# Patient Record
Sex: Male | Born: 1935 | Race: Black or African American | Hispanic: No | Marital: Married | Smoking: Never smoker
Health system: Southern US, Community
[De-identification: ages and names within clinical notes are randomized; demographics above are authoritative.]

## PROBLEM LIST (undated history)

## (undated) DIAGNOSIS — N400 Enlarged prostate without lower urinary tract symptoms: Secondary | ICD-10-CM

## (undated) DIAGNOSIS — I10 Essential (primary) hypertension: Secondary | ICD-10-CM

## (undated) DIAGNOSIS — C9000 Multiple myeloma not having achieved remission: Secondary | ICD-10-CM

## (undated) HISTORY — PX: PROSTATE SURGERY: SHX751

## (undated) HISTORY — PX: EYE SURGERY: SHX253

---

## 1999-08-28 ENCOUNTER — Emergency Department (HOSPITAL_COMMUNITY): Admission: EM | Admit: 1999-08-28 | Discharge: 1999-08-28 | Payer: Self-pay | Admitting: Emergency Medicine

## 1999-09-03 ENCOUNTER — Encounter: Payer: Self-pay | Admitting: Urology

## 1999-09-04 ENCOUNTER — Inpatient Hospital Stay (HOSPITAL_COMMUNITY): Admission: RE | Admit: 1999-09-04 | Discharge: 1999-09-06 | Payer: Self-pay | Admitting: Urology

## 1999-09-06 ENCOUNTER — Emergency Department (HOSPITAL_COMMUNITY): Admission: EM | Admit: 1999-09-06 | Discharge: 1999-09-06 | Payer: Self-pay | Admitting: Emergency Medicine

## 2001-02-21 ENCOUNTER — Other Ambulatory Visit: Admission: RE | Admit: 2001-02-21 | Discharge: 2001-02-21 | Payer: Self-pay | Admitting: Urology

## 2001-02-21 ENCOUNTER — Encounter (INDEPENDENT_AMBULATORY_CARE_PROVIDER_SITE_OTHER): Payer: Self-pay | Admitting: Specialist

## 2002-09-21 ENCOUNTER — Ambulatory Visit: Admission: RE | Admit: 2002-09-21 | Discharge: 2002-12-20 | Payer: Self-pay | Admitting: Radiation Oncology

## 2002-11-06 ENCOUNTER — Encounter: Admission: RE | Admit: 2002-11-06 | Discharge: 2002-11-06 | Payer: Self-pay | Admitting: Urology

## 2002-11-06 ENCOUNTER — Encounter: Payer: Self-pay | Admitting: Urology

## 2002-12-08 ENCOUNTER — Ambulatory Visit (HOSPITAL_BASED_OUTPATIENT_CLINIC_OR_DEPARTMENT_OTHER): Admission: RE | Admit: 2002-12-08 | Discharge: 2002-12-08 | Payer: Self-pay | Admitting: Urology

## 2002-12-28 ENCOUNTER — Ambulatory Visit: Admission: RE | Admit: 2002-12-28 | Discharge: 2002-12-28 | Payer: Self-pay | Admitting: Radiation Oncology

## 2002-12-30 ENCOUNTER — Ambulatory Visit: Admission: RE | Admit: 2002-12-30 | Discharge: 2002-12-30 | Payer: Self-pay | Admitting: Radiation Oncology

## 2003-12-28 ENCOUNTER — Encounter: Admission: RE | Admit: 2003-12-28 | Discharge: 2003-12-28 | Payer: Self-pay | Admitting: Internal Medicine

## 2004-03-18 ENCOUNTER — Ambulatory Visit (HOSPITAL_COMMUNITY): Admission: RE | Admit: 2004-03-18 | Discharge: 2004-03-18 | Payer: Self-pay | Admitting: *Deleted

## 2009-07-08 ENCOUNTER — Ambulatory Visit: Payer: Self-pay | Admitting: Internal Medicine

## 2009-07-10 LAB — CBC WITH DIFFERENTIAL/PLATELET
BASO%: 0.4 % (ref 0.0–2.0)
Basophils Absolute: 0 10*3/uL (ref 0.0–0.1)
EOS%: 2.7 % (ref 0.0–7.0)
Eosinophils Absolute: 0.1 10*3/uL (ref 0.0–0.5)
HCT: 36.6 % — ABNORMAL LOW (ref 38.4–49.9)
HGB: 11.7 g/dL — ABNORMAL LOW (ref 13.0–17.1)
LYMPH%: 28.8 % (ref 14.0–49.0)
MCH: 25.2 pg — ABNORMAL LOW (ref 27.2–33.4)
MCHC: 32 g/dL (ref 32.0–36.0)
MCV: 78.6 fL — ABNORMAL LOW (ref 79.3–98.0)
MONO#: 0.5 10*3/uL (ref 0.1–0.9)
MONO%: 9.9 % (ref 0.0–14.0)
NEUT#: 2.7 10*3/uL (ref 1.5–6.5)
NEUT%: 58.2 % (ref 39.0–75.0)
Platelets: 181 10*3/uL (ref 140–400)
RBC: 4.66 10*6/uL (ref 4.20–5.82)
RDW: 13.2 % (ref 11.0–14.6)
WBC: 4.7 10*3/uL (ref 4.0–10.3)
lymph#: 1.3 10*3/uL (ref 0.9–3.3)

## 2009-07-12 LAB — COMPREHENSIVE METABOLIC PANEL
ALT: 20 U/L (ref 0–53)
AST: 22 U/L (ref 0–37)
Albumin: 3.9 g/dL (ref 3.5–5.2)
Alkaline Phosphatase: 52 U/L (ref 39–117)
BUN: 16 mg/dL (ref 6–23)
CO2: 26 mEq/L (ref 19–32)
Calcium: 9.3 mg/dL (ref 8.4–10.5)
Chloride: 105 mEq/L (ref 96–112)
Creatinine, Ser: 1.07 mg/dL (ref 0.40–1.50)
Glucose, Bld: 100 mg/dL — ABNORMAL HIGH (ref 70–99)
Potassium: 4 mEq/L (ref 3.5–5.3)
Sodium: 140 mEq/L (ref 135–145)
Total Bilirubin: 0.5 mg/dL (ref 0.3–1.2)
Total Protein: 6.9 g/dL (ref 6.0–8.3)

## 2009-07-12 LAB — IMMUNOFIXATION ELECTROPHORESIS
IgA: 157 mg/dL (ref 68–378)
IgG (Immunoglobin G), Serum: 1610 mg/dL (ref 694–1618)
IgM, Serum: 50 mg/dL — ABNORMAL LOW (ref 60–263)
Total Protein, Serum Electrophoresis: 6.9 g/dL (ref 6.0–8.3)

## 2009-07-12 LAB — KAPPA/LAMBDA LIGHT CHAINS
Kappa free light chain: 1.56 mg/dL (ref 0.33–1.94)
Kappa:Lambda Ratio: 1.2 (ref 0.26–1.65)
Lambda Free Lght Chn: 1.3 mg/dL (ref 0.57–2.63)

## 2009-07-12 LAB — LACTATE DEHYDROGENASE: LDH: 132 U/L (ref 94–250)

## 2009-07-12 LAB — BETA 2 MICROGLOBULIN, SERUM: Beta-2 Microglobulin: 1.49 mg/L (ref 1.01–1.73)

## 2009-07-19 ENCOUNTER — Ambulatory Visit (HOSPITAL_COMMUNITY): Admission: RE | Admit: 2009-07-19 | Discharge: 2009-07-19 | Payer: Self-pay | Admitting: Internal Medicine

## 2009-07-23 LAB — CBC WITH DIFFERENTIAL/PLATELET
BASO%: 0.4 % (ref 0.0–2.0)
Basophils Absolute: 0 10*3/uL (ref 0.0–0.1)
EOS%: 3.5 % (ref 0.0–7.0)
Eosinophils Absolute: 0.2 10*3/uL (ref 0.0–0.5)
HCT: 35.6 % — ABNORMAL LOW (ref 38.4–49.9)
HGB: 11.6 g/dL — ABNORMAL LOW (ref 13.0–17.1)
LYMPH%: 28.7 % (ref 14.0–49.0)
MCH: 25.4 pg — ABNORMAL LOW (ref 27.2–33.4)
MCHC: 32.6 g/dL (ref 32.0–36.0)
MCV: 77.8 fL — ABNORMAL LOW (ref 79.3–98.0)
MONO#: 0.4 10*3/uL (ref 0.1–0.9)
MONO%: 7.8 % (ref 0.0–14.0)
NEUT#: 2.8 10*3/uL (ref 1.5–6.5)
NEUT%: 59.6 % (ref 39.0–75.0)
Platelets: 172 10*3/uL (ref 140–400)
RBC: 4.58 10*6/uL (ref 4.20–5.82)
RDW: 13.2 % (ref 11.0–14.6)
WBC: 4.7 10*3/uL (ref 4.0–10.3)
lymph#: 1.3 10*3/uL (ref 0.9–3.3)

## 2009-07-24 LAB — FERRITIN: Ferritin: 158 ng/mL (ref 22–322)

## 2009-07-24 LAB — ERYTHROPOIETIN: Erythropoietin: 22.7 m[IU]/mL (ref 2.6–34.0)

## 2009-07-24 LAB — VITAMIN B12: Vitamin B-12: 429 pg/mL (ref 211–911)

## 2009-07-24 LAB — IRON AND TIBC
%SAT: 24 % (ref 20–55)
Iron: 66 ug/dL (ref 42–165)
TIBC: 276 ug/dL (ref 215–435)
UIBC: 210 ug/dL

## 2009-07-24 LAB — FOLATE: Folate: 15.9 ng/mL

## 2009-10-10 ENCOUNTER — Ambulatory Visit: Payer: Self-pay | Admitting: Internal Medicine

## 2009-10-14 LAB — CBC WITH DIFFERENTIAL/PLATELET
BASO%: 0.5 % (ref 0.0–2.0)
Basophils Absolute: 0 10*3/uL (ref 0.0–0.1)
EOS%: 3.4 % (ref 0.0–7.0)
Eosinophils Absolute: 0.2 10*3/uL (ref 0.0–0.5)
HCT: 37.5 % — ABNORMAL LOW (ref 38.4–49.9)
HGB: 12 g/dL — ABNORMAL LOW (ref 13.0–17.1)
LYMPH%: 24.8 % (ref 14.0–49.0)
MCH: 25.6 pg — ABNORMAL LOW (ref 27.2–33.4)
MCHC: 32.1 g/dL (ref 32.0–36.0)
MCV: 79.8 fL (ref 79.3–98.0)
MONO#: 0.4 10*3/uL (ref 0.1–0.9)
MONO%: 8.2 % (ref 0.0–14.0)
NEUT#: 3.4 10*3/uL (ref 1.5–6.5)
NEUT%: 63.1 % (ref 39.0–75.0)
Platelets: 171 10*3/uL (ref 140–400)
RBC: 4.71 10*6/uL (ref 4.20–5.82)
RDW: 12.9 % (ref 11.0–14.6)
WBC: 5.4 10*3/uL (ref 4.0–10.3)
lymph#: 1.3 10*3/uL (ref 0.9–3.3)

## 2009-10-15 LAB — COMPREHENSIVE METABOLIC PANEL
ALT: 16 U/L (ref 0–53)
AST: 18 U/L (ref 0–37)
Albumin: 4 g/dL (ref 3.5–5.2)
Alkaline Phosphatase: 49 U/L (ref 39–117)
BUN: 15 mg/dL (ref 6–23)
CO2: 27 mEq/L (ref 19–32)
Calcium: 9.6 mg/dL (ref 8.4–10.5)
Chloride: 105 mEq/L (ref 96–112)
Creatinine, Ser: 1.01 mg/dL (ref 0.40–1.50)
Glucose, Bld: 97 mg/dL (ref 70–99)
Potassium: 4.2 mEq/L (ref 3.5–5.3)
Sodium: 143 mEq/L (ref 135–145)
Total Bilirubin: 0.5 mg/dL (ref 0.3–1.2)
Total Protein: 6.8 g/dL (ref 6.0–8.3)

## 2009-10-15 LAB — IGG, IGA, IGM
IgA: 150 mg/dL (ref 68–378)
IgG (Immunoglobin G), Serum: 1470 mg/dL (ref 694–1618)
IgM, Serum: 50 mg/dL — ABNORMAL LOW (ref 60–263)

## 2009-10-15 LAB — BETA 2 MICROGLOBULIN, SERUM: Beta-2 Microglobulin: 1.8 mg/L — ABNORMAL HIGH (ref 1.01–1.73)

## 2009-10-15 LAB — KAPPA/LAMBDA LIGHT CHAINS
Kappa free light chain: 1.56 mg/dL (ref 0.33–1.94)
Kappa:Lambda Ratio: 1.47 (ref 0.26–1.65)
Lambda Free Lght Chn: 1.06 mg/dL (ref 0.57–2.63)

## 2009-10-15 LAB — LACTATE DEHYDROGENASE: LDH: 121 U/L (ref 94–250)

## 2010-02-10 ENCOUNTER — Ambulatory Visit: Payer: Self-pay | Admitting: Internal Medicine

## 2010-02-17 LAB — CBC WITH DIFFERENTIAL/PLATELET
BASO%: 0 % (ref 0.0–2.0)
Basophils Absolute: 0 10*3/uL (ref 0.0–0.1)
EOS%: 3.6 % (ref 0.0–7.0)
Eosinophils Absolute: 0.2 10*3/uL (ref 0.0–0.5)
HCT: 36 % — ABNORMAL LOW (ref 38.4–49.9)
HGB: 11.7 g/dL — ABNORMAL LOW (ref 13.0–17.1)
LYMPH%: 20.9 % (ref 14.0–49.0)
MCH: 25.8 pg — ABNORMAL LOW (ref 27.2–33.4)
MCHC: 32.6 g/dL (ref 32.0–36.0)
MCV: 79.1 fL — ABNORMAL LOW (ref 79.3–98.0)
MONO#: 0.4 10*3/uL (ref 0.1–0.9)
MONO%: 7.1 % (ref 0.0–14.0)
NEUT#: 4.1 10*3/uL (ref 1.5–6.5)
NEUT%: 68.4 % (ref 39.0–75.0)
Platelets: 171 10*3/uL (ref 140–400)
RBC: 4.56 10*6/uL (ref 4.20–5.82)
RDW: 12.5 % (ref 11.0–14.6)
WBC: 6 10*3/uL (ref 4.0–10.3)
lymph#: 1.3 10*3/uL (ref 0.9–3.3)

## 2010-02-18 LAB — COMPREHENSIVE METABOLIC PANEL
ALT: 17 U/L (ref 0–53)
AST: 17 U/L (ref 0–37)
Albumin: 4 g/dL (ref 3.5–5.2)
Alkaline Phosphatase: 47 U/L (ref 39–117)
BUN: 13 mg/dL (ref 6–23)
CO2: 28 mEq/L (ref 19–32)
Calcium: 9.5 mg/dL (ref 8.4–10.5)
Chloride: 104 mEq/L (ref 96–112)
Creatinine, Ser: 1.03 mg/dL (ref 0.40–1.50)
Glucose, Bld: 108 mg/dL — ABNORMAL HIGH (ref 70–99)
Potassium: 4.3 mEq/L (ref 3.5–5.3)
Sodium: 139 mEq/L (ref 135–145)
Total Bilirubin: 0.5 mg/dL (ref 0.3–1.2)
Total Protein: 6.7 g/dL (ref 6.0–8.3)

## 2010-02-18 LAB — IGG, IGA, IGM
IgA: 150 mg/dL (ref 68–378)
IgG (Immunoglobin G), Serum: 1660 mg/dL — ABNORMAL HIGH (ref 694–1618)
IgM, Serum: 50 mg/dL — ABNORMAL LOW (ref 60–263)

## 2010-02-18 LAB — KAPPA/LAMBDA LIGHT CHAINS
Kappa free light chain: 1.69 mg/dL (ref 0.33–1.94)
Kappa:Lambda Ratio: 1.84 — ABNORMAL HIGH (ref 0.26–1.65)
Lambda Free Lght Chn: 0.92 mg/dL (ref 0.57–2.63)

## 2010-02-18 LAB — LACTATE DEHYDROGENASE: LDH: 120 U/L (ref 94–250)

## 2010-02-18 LAB — BETA 2 MICROGLOBULIN, SERUM: Beta-2 Microglobulin: 1.76 mg/L — ABNORMAL HIGH (ref 1.01–1.73)

## 2010-06-23 ENCOUNTER — Ambulatory Visit: Payer: Self-pay | Admitting: Internal Medicine

## 2010-06-25 LAB — CBC WITH DIFFERENTIAL/PLATELET
BASO%: 0.5 % (ref 0.0–2.0)
Basophils Absolute: 0 10*3/uL (ref 0.0–0.1)
EOS%: 2.8 % (ref 0.0–7.0)
Eosinophils Absolute: 0.1 10*3/uL (ref 0.0–0.5)
HCT: 36.3 % — ABNORMAL LOW (ref 38.4–49.9)
HGB: 11.7 g/dL — ABNORMAL LOW (ref 13.0–17.1)
LYMPH%: 22.7 % (ref 14.0–49.0)
MCH: 25.5 pg — ABNORMAL LOW (ref 27.2–33.4)
MCHC: 32.2 g/dL (ref 32.0–36.0)
MCV: 79.2 fL — ABNORMAL LOW (ref 79.3–98.0)
MONO#: 0.3 10*3/uL (ref 0.1–0.9)
MONO%: 6.7 % (ref 0.0–14.0)
NEUT#: 3.3 10*3/uL (ref 1.5–6.5)
NEUT%: 67.3 % (ref 39.0–75.0)
Platelets: 179 10*3/uL (ref 140–400)
RBC: 4.58 10*6/uL (ref 4.20–5.82)
RDW: 12.6 % (ref 11.0–14.6)
WBC: 5 10*3/uL (ref 4.0–10.3)
lymph#: 1.1 10*3/uL (ref 0.9–3.3)

## 2010-06-26 LAB — COMPREHENSIVE METABOLIC PANEL
ALT: 16 U/L (ref 0–53)
AST: 17 U/L (ref 0–37)
Albumin: 4 g/dL (ref 3.5–5.2)
Alkaline Phosphatase: 50 U/L (ref 39–117)
BUN: 17 mg/dL (ref 6–23)
CO2: 24 mEq/L (ref 19–32)
Calcium: 9.1 mg/dL (ref 8.4–10.5)
Chloride: 105 mEq/L (ref 96–112)
Creatinine, Ser: 1.05 mg/dL (ref 0.40–1.50)
Glucose, Bld: 109 mg/dL — ABNORMAL HIGH (ref 70–99)
Potassium: 3.7 mEq/L (ref 3.5–5.3)
Sodium: 141 mEq/L (ref 135–145)
Total Bilirubin: 0.5 mg/dL (ref 0.3–1.2)
Total Protein: 7 g/dL (ref 6.0–8.3)

## 2010-06-26 LAB — KAPPA/LAMBDA LIGHT CHAINS
Kappa free light chain: 1.66 mg/dL (ref 0.33–1.94)
Kappa:Lambda Ratio: 1.38 (ref 0.26–1.65)
Lambda Free Lght Chn: 1.2 mg/dL (ref 0.57–2.63)

## 2010-06-26 LAB — IGG, IGA, IGM
IgA: 149 mg/dL (ref 68–378)
IgG (Immunoglobin G), Serum: 1660 mg/dL — ABNORMAL HIGH (ref 694–1618)
IgM, Serum: 57 mg/dL — ABNORMAL LOW (ref 60–263)

## 2010-06-26 LAB — BETA 2 MICROGLOBULIN, SERUM: Beta-2 Microglobulin: 1.52 mg/L (ref 1.01–1.73)

## 2010-12-22 ENCOUNTER — Ambulatory Visit: Payer: Self-pay | Admitting: Internal Medicine

## 2010-12-24 LAB — CBC WITH DIFFERENTIAL/PLATELET
BASO%: 0.3 % (ref 0.0–2.0)
Basophils Absolute: 0 10*3/uL (ref 0.0–0.1)
EOS%: 1.3 % (ref 0.0–7.0)
Eosinophils Absolute: 0.1 10*3/uL (ref 0.0–0.5)
HCT: 36 % — ABNORMAL LOW (ref 38.4–49.9)
HGB: 11.6 g/dL — ABNORMAL LOW (ref 13.0–17.1)
LYMPH%: 13 % — ABNORMAL LOW (ref 14.0–49.0)
MCH: 25.4 pg — ABNORMAL LOW (ref 27.2–33.4)
MCHC: 32.2 g/dL (ref 32.0–36.0)
MCV: 78.9 fL — ABNORMAL LOW (ref 79.3–98.0)
MONO#: 0.3 10*3/uL (ref 0.1–0.9)
MONO%: 4.9 % (ref 0.0–14.0)
NEUT#: 5.1 10*3/uL (ref 1.5–6.5)
NEUT%: 80.5 % — ABNORMAL HIGH (ref 39.0–75.0)
Platelets: 164 10*3/uL (ref 140–400)
RBC: 4.56 10*6/uL (ref 4.20–5.82)
RDW: 13 % (ref 11.0–14.6)
WBC: 6.3 10*3/uL (ref 4.0–10.3)
lymph#: 0.8 10*3/uL — ABNORMAL LOW (ref 0.9–3.3)

## 2010-12-25 LAB — KAPPA/LAMBDA LIGHT CHAINS
Kappa free light chain: 1.38 mg/dL (ref 0.33–1.94)
Kappa:Lambda Ratio: 2 — ABNORMAL HIGH (ref 0.26–1.65)
Lambda Free Lght Chn: 0.69 mg/dL (ref 0.57–2.63)

## 2010-12-25 LAB — LACTATE DEHYDROGENASE: LDH: 112 U/L (ref 94–250)

## 2010-12-25 LAB — COMPREHENSIVE METABOLIC PANEL
ALT: 14 U/L (ref 0–53)
AST: 16 U/L (ref 0–37)
Albumin: 3.8 g/dL (ref 3.5–5.2)
Alkaline Phosphatase: 51 U/L (ref 39–117)
BUN: 16 mg/dL (ref 6–23)
CO2: 27 mEq/L (ref 19–32)
Calcium: 9.1 mg/dL (ref 8.4–10.5)
Chloride: 103 mEq/L (ref 96–112)
Creatinine, Ser: 0.97 mg/dL (ref 0.40–1.50)
Glucose, Bld: 162 mg/dL — ABNORMAL HIGH (ref 70–99)
Potassium: 4.1 mEq/L (ref 3.5–5.3)
Sodium: 140 mEq/L (ref 135–145)
Total Bilirubin: 0.6 mg/dL (ref 0.3–1.2)
Total Protein: 6.6 g/dL (ref 6.0–8.3)

## 2010-12-25 LAB — BETA 2 MICROGLOBULIN, SERUM: Beta-2 Microglobulin: 1.29 mg/L (ref 1.01–1.73)

## 2010-12-25 LAB — IGG, IGA, IGM
IgA: 133 mg/dL (ref 68–378)
IgG (Immunoglobin G), Serum: 1580 mg/dL (ref 694–1618)
IgM, Serum: 46 mg/dL — ABNORMAL LOW (ref 60–263)

## 2010-12-31 ENCOUNTER — Ambulatory Visit (HOSPITAL_BASED_OUTPATIENT_CLINIC_OR_DEPARTMENT_OTHER): Payer: Medicare Other | Admitting: Internal Medicine

## 2010-12-31 DIAGNOSIS — D649 Anemia, unspecified: Secondary | ICD-10-CM

## 2010-12-31 DIAGNOSIS — D472 Monoclonal gammopathy: Secondary | ICD-10-CM

## 2011-04-17 NOTE — Op Note (Signed)
NAMETIMMEY, LAMBA                            ACCOUNT NO.:  [id number]   MEDICAL RECORD NO.:  [id number]                   PATIENT TYPE:  AMB   LOCATION:  ENDO                                 FACILITY:  [facility]   PHYSICIAN:  Georgiana Spinner, M.D.                 DATE OF BIRTH:  February 20, 1929   DATE OF PROCEDURE:  03/18/2004  DATE OF DISCHARGE:                                 OPERATIVE REPORT   PROCEDURE:  Upper endoscopy.   ENDOSCOPIST:  Georgiana Spinner, M.D.   INDICATIONS:  Gastroesophageal reflux disease and abdominal pain.   ANESTHESIA:  Demerol 40 mg, Versed 4 mg.   DESCRIPTION OF PROCEDURE:  With the patient mildly sedated in the left  lateral decubitus position, the Olympus videoscopic endoscope was inserted  into the mouth, passed under direct vision through the esophagus which  appeared normal into the stomach. The fundus, body, antrum, duodenal bulb,  second portion of the duodenum all appeared normal.  From this point, the  endoscope was slowly withdrawn taking  circumferential views of the duodenal  mucosa until the endoscope was then pulled back into the stomach, placed in  retroflexion to view the stomach from below.  The endoscope was then  straightened and withdrawn taking circumferential views of the remaining  gastric and esophageal mucosa.  The patient's vital signs and pulse oximeter  remained stable.  The patient tolerated the procedure well without apparent  complications.   FINDINGS:  Unremarkable exam.   PLAN:  Proceed to colonoscopy.                                               Georgiana Spinner, M.D.    GMO/MEDQ  D:  03/18/2004  T:  03/18/2004  Job:  [id number]

## 2011-04-17 NOTE — Discharge Summary (Signed)
[place]  Patient:    Joe [name]                          MRN: [id number] Adm. Date:  [id number] Disc. Date: 09/06/99 Attending:  Annamarie Dawley CC:         Janae Bridgeman Eloise Harman., M.D.                           Discharge Summary  DISCHARGE DIAGNOSES: 1. Urinary retention. 2. Benign prostatic hypertrophy. 3. Hypertension.  PROCEDURES:  Cystoscopy and TURP on September 04, 1999.  HISTORY OF PRESENT ILLNESS:  The patient is a 77 year old male who went into urinary retention a week before his admission.  He was then catheterized for about 500 cc of urine.  He had failed two voiding trials since.  He was admitted on September 04, 1999, for TURP.  PHYSICAL EXAMINATION:  VITAL SIGNS:  Blood pressure 130/79, pulse 61, respirations 16, temperature 99.7. Weight 210 pounds.  HEENT:  Normal.  Ptosis of the right eyelid.  Pupils are equal and reactive to light and accommodation.  Ears, nose, and throat within normal limits.  NECK:  Supple.  No cervical lymph nodes.  No thyromegaly.  CHEST:  Symmetrical.  LUNGS:  Fully expanded and clear to auscultation and percussion.  HEART:  Regular rhythm.  No murmur or gallops.  ABDOMEN:  Soft, nontender, nondistended.  Liver, spleen, and kidneys not palpable. No organomegaly.  Bowel sounds normal.  GU:  Penis is uncircumcised.  His meatus is normal.  Scrotum is normal. Testicles, epididymis, and cord structures are normal.  EXTREMITIES:  Within normal limits.  RECTAL:  Sphincter tone is normal.  Prostate is enlarged, 2+, and there are no nodules.  ADMISSION LABORATORY DATA:  Hemoglobin 11.6, hematocrit 37.4, WBC 7.6.  PT and TT are within normal limits.  Sodium 135, potassium 2.9, chloride 98, BUN 10, creatinine 1.2, glucose 117.  Urinalysis showed more than 300 mg ______ or protein, 0-5 wbcs, and too numerous to count rbcs.  His urine culture showed insignificant growth.  EKG showed normal  sinus rhythm.  HOSPITAL COURSE:  The patient had a TURP on September 04, 1999.  The postoperative course was uneventful.  He remained afebrile.  He tolerated his diet well.  The Foley catheter was removed on the second day postoperatively. After removing the Foley, he was voiding well.  His urine was clear.  He was then discharged home.  DISCHARGE MEDICATIONS: 1. Cardura 4 mg p.o. twice a day. 2. Pravachol 20 mg p.o. daily. 3. Monopril 10 mg p.o. daily. 4. Indapamide 2.5 mg p.o. daily. 5. Bactrim DS 1 tablet p.o. twice a day. 6. Pyridium Plus 1 tablet p.o. three times a day.  DISCHARGE INSTRUCTIONS:  The patient is instructed not to do any lifting, straining, or any driving until further advised.  CONDITION ON DISCHARGE:  Improved.  DIET:  Regular. DD:  09/19/99 TD:  09/21/99 Job: [id number]

## 2011-06-24 ENCOUNTER — Other Ambulatory Visit: Payer: Self-pay | Admitting: Internal Medicine

## 2011-06-24 ENCOUNTER — Encounter (HOSPITAL_BASED_OUTPATIENT_CLINIC_OR_DEPARTMENT_OTHER): Payer: Medicare Other | Admitting: Internal Medicine

## 2011-06-24 DIAGNOSIS — D472 Monoclonal gammopathy: Secondary | ICD-10-CM

## 2011-06-24 DIAGNOSIS — D649 Anemia, unspecified: Secondary | ICD-10-CM

## 2011-06-24 LAB — CBC WITH DIFFERENTIAL/PLATELET
BASO%: 0.3 % (ref 0.0–2.0)
Basophils Absolute: 0 10*3/uL (ref 0.0–0.1)
EOS%: 2.9 % (ref 0.0–7.0)
Eosinophils Absolute: 0.1 10*3/uL (ref 0.0–0.5)
HCT: 36.3 % — ABNORMAL LOW (ref 38.4–49.9)
HGB: 11.7 g/dL — ABNORMAL LOW (ref 13.0–17.1)
LYMPH%: 25.9 % (ref 14.0–49.0)
MCH: 25.3 pg — ABNORMAL LOW (ref 27.2–33.4)
MCHC: 32.3 g/dL (ref 32.0–36.0)
MCV: 78.4 fL — ABNORMAL LOW (ref 79.3–98.0)
MONO#: 0.4 10*3/uL (ref 0.1–0.9)
MONO%: 8 % (ref 0.0–14.0)
NEUT#: 2.9 10*3/uL (ref 1.5–6.5)
NEUT%: 62.9 % (ref 39.0–75.0)
Platelets: 178 10*3/uL (ref 140–400)
RBC: 4.63 10*6/uL (ref 4.20–5.82)
RDW: 13.4 % (ref 11.0–14.6)
WBC: 4.6 10*3/uL (ref 4.0–10.3)
lymph#: 1.2 10*3/uL (ref 0.9–3.3)

## 2011-06-26 LAB — KAPPA/LAMBDA LIGHT CHAINS
Kappa free light chain: 0.77 mg/dL (ref 0.33–1.94)
Kappa:Lambda Ratio: 1.01 (ref 0.26–1.65)
Lambda Free Lght Chn: 0.76 mg/dL (ref 0.57–2.63)

## 2011-06-26 LAB — COMPREHENSIVE METABOLIC PANEL
ALT: 12 U/L (ref 0–53)
AST: 18 U/L (ref 0–37)
Albumin: 3.9 g/dL (ref 3.5–5.2)
Alkaline Phosphatase: 51 U/L (ref 39–117)
BUN: 18 mg/dL (ref 6–23)
CO2: 27 mEq/L (ref 19–32)
Calcium: 9.5 mg/dL (ref 8.4–10.5)
Chloride: 106 mEq/L (ref 96–112)
Creatinine, Ser: 1.23 mg/dL (ref 0.50–1.35)
Glucose, Bld: 104 mg/dL — ABNORMAL HIGH (ref 70–99)
Potassium: 4 mEq/L (ref 3.5–5.3)
Sodium: 141 mEq/L (ref 135–145)
Total Bilirubin: 0.6 mg/dL (ref 0.3–1.2)
Total Protein: 7 g/dL (ref 6.0–8.3)

## 2011-06-26 LAB — IGG, IGA, IGM
IgA: 142 mg/dL (ref 68–379)
IgG (Immunoglobin G), Serum: 1610 mg/dL — ABNORMAL HIGH (ref 650–1600)
IgM, Serum: 43 mg/dL (ref 41–251)

## 2011-06-26 LAB — BETA 2 MICROGLOBULIN, SERUM: Beta-2 Microglobulin: 1.7 mg/L (ref 1.01–1.73)

## 2011-06-26 LAB — LACTATE DEHYDROGENASE: LDH: 108 U/L (ref 94–250)

## 2011-07-01 ENCOUNTER — Encounter (HOSPITAL_BASED_OUTPATIENT_CLINIC_OR_DEPARTMENT_OTHER): Payer: Medicare Other | Admitting: Internal Medicine

## 2011-07-01 DIAGNOSIS — D649 Anemia, unspecified: Secondary | ICD-10-CM

## 2011-07-01 DIAGNOSIS — D472 Monoclonal gammopathy: Secondary | ICD-10-CM

## 2011-12-10 ENCOUNTER — Telehealth: Payer: Self-pay | Admitting: Internal Medicine

## 2011-12-10 NOTE — Telephone Encounter (Signed)
pt aware of 01/12/12 appt,per pof from 07/01/11   aom

## 2012-01-11 ENCOUNTER — Other Ambulatory Visit: Payer: Self-pay | Admitting: Internal Medicine

## 2012-01-11 DIAGNOSIS — D472 Monoclonal gammopathy: Secondary | ICD-10-CM

## 2012-01-12 ENCOUNTER — Other Ambulatory Visit: Payer: Medicare Other

## 2012-01-12 ENCOUNTER — Telehealth: Payer: Self-pay | Admitting: Internal Medicine

## 2012-01-12 ENCOUNTER — Ambulatory Visit (HOSPITAL_BASED_OUTPATIENT_CLINIC_OR_DEPARTMENT_OTHER): Payer: Medicare Other | Admitting: Internal Medicine

## 2012-01-12 VITALS — BP 146/80 | HR 71 | Temp 96.9°F | Ht 72.0 in | Wt 220.4 lb

## 2012-01-12 DIAGNOSIS — D649 Anemia, unspecified: Secondary | ICD-10-CM

## 2012-01-12 DIAGNOSIS — D472 Monoclonal gammopathy: Secondary | ICD-10-CM | POA: Diagnosis not present

## 2012-01-12 LAB — CBC WITH DIFFERENTIAL/PLATELET
BASO%: 0.4 % (ref 0.0–2.0)
Basophils Absolute: 0 10*3/uL (ref 0.0–0.1)
EOS%: 4.6 % (ref 0.0–7.0)
Eosinophils Absolute: 0.2 10*3/uL (ref 0.0–0.5)
HCT: 36 % — ABNORMAL LOW (ref 38.4–49.9)
HGB: 11.8 g/dL — ABNORMAL LOW (ref 13.0–17.1)
LYMPH%: 27.8 % (ref 14.0–49.0)
MCH: 25.5 pg — ABNORMAL LOW (ref 27.2–33.4)
MCHC: 32.6 g/dL (ref 32.0–36.0)
MCV: 78.2 fL — ABNORMAL LOW (ref 79.3–98.0)
MONO#: 0.2 10*3/uL (ref 0.1–0.9)
MONO%: 6.2 % (ref 0.0–14.0)
NEUT#: 2.3 10*3/uL (ref 1.5–6.5)
NEUT%: 61 % (ref 39.0–75.0)
Platelets: 165 10*3/uL (ref 140–400)
RBC: 4.61 10*6/uL (ref 4.20–5.82)
RDW: 13.2 % (ref 11.0–14.6)
WBC: 3.8 10*3/uL — ABNORMAL LOW (ref 4.0–10.3)
lymph#: 1.1 10*3/uL (ref 0.9–3.3)

## 2012-01-12 NOTE — Telephone Encounter (Signed)
appts made and printed for 8/13 and 8/15   aom

## 2012-01-12 NOTE — Progress Notes (Signed)
[facility] OFFICE PROGRESS NOTE  PRINCIPAL DIAGNOSIS:  Monoclonal gammopathy of undetermined significance in addition to mild iron deficiency anemia.  CURRENT THERAPY:  Observation.  INTERVAL HISTORY: Joe [name] 78 y.o. male returns to the clinic today for annual followup visit. The patient has no complaints today. He denied having any significant weight loss or night sweats. He has no chest pain or shortness of breath. He has repeat CBC, comprehensive metabolic panel, LDH and myeloma panel performed earlier today and he is here for evaluation and discussion of his lab results.  ALLERGIES:   has no known allergies.  MEDICATIONS:  Current Outpatient Prescriptions  Medication Sig Dispense Refill  . doxazosin (CARDURA) 4 MG tablet Take 4 mg by mouth at bedtime.      . dutasteride (AVODART) 0.5 MG capsule Take 0.5 mg by mouth daily.      . fosinopril (MONOPRIL) 40 MG tablet Take 40 mg by mouth daily.      . indapamide (LOZOL) 2.5 MG tablet Take 2.5 mg by mouth every morning.      . pravastatin (PRAVACHOL) 40 MG tablet Take 40 mg by mouth daily.        REVIEW OF SYSTEMS:  A comprehensive review of systems was negative.   PHYSICAL EXAMINATION: General appearance: alert, cooperative and no distress Lymph nodes: Cervical, supraclavicular, and axillary nodes normal. Resp: clear to auscultation bilaterally Cardio: regular rate and rhythm, S1, S2 normal, no murmur, click, rub or gallop GI: soft, non-tender; bowel sounds normal; no masses,  no organomegaly Extremities: extremities normal, atraumatic, no cyanosis or edema Neurologic: Alert and oriented X 3, normal strength and tone. Normal symmetric reflexes. Normal coordination and gait  ECOG PERFORMANCE STATUS: 0 - Asymptomatic  Blood pressure 146/80, pulse 71, temperature 96.9 F (36.1 C), temperature source Oral, height 6' (1.829 m), weight 220 lb 6.4 oz (99.973 kg).  LABORATORY DATA: Lab Results  Component  Value Date   WBC 3.8* 01/12/2012   HGB 11.8* 01/12/2012   HCT 36.0* 01/12/2012   MCV 78.2* 01/12/2012   PLT 165 01/12/2012      Chemistry      Component Value Date/Time   NA 141 06/24/2011 0924   NA 141 06/24/2011 0924   NA 141 06/24/2011 0924   NA 141 06/24/2011 0924   K 4.0 06/24/2011 0924   K 4.0 06/24/2011 0924   K 4.0 06/24/2011 0924   K 4.0 06/24/2011 0924   CL 106 06/24/2011 0924   CL 106 06/24/2011 0924   CL 106 06/24/2011 0924   CL 106 06/24/2011 0924   CO2 27 06/24/2011 0924   CO2 27 06/24/2011 0924   CO2 27 06/24/2011 0924   CO2 27 06/24/2011 0924   BUN 18 06/24/2011 0924   BUN 18 06/24/2011 0924   BUN 18 06/24/2011 0924   BUN 18 06/24/2011 0924   CREATININE 1.23 06/24/2011 0924   CREATININE 1.23 06/24/2011 0924   CREATININE 1.23 06/24/2011 0924   CREATININE 1.23 06/24/2011 0924      Component Value Date/Time   CALCIUM 9.5 06/24/2011 0924   CALCIUM 9.5 06/24/2011 0924   CALCIUM 9.5 06/24/2011 0924   CALCIUM 9.5 06/24/2011 0924   ALKPHOS 51 06/24/2011 0924   ALKPHOS 51 06/24/2011 0924   ALKPHOS 51 06/24/2011 0924   ALKPHOS 51 06/24/2011 0924   AST 18 06/24/2011 0924   AST 18 06/24/2011 0924   AST 18 06/24/2011 0924   AST 18 06/24/2011  0924   ALT 12 06/24/2011 0924   ALT 12 06/24/2011 0924   ALT 12 06/24/2011 0924   ALT 12 06/24/2011 0924   BILITOT 0.6 06/24/2011 0924   BILITOT 0.6 06/24/2011 0924   BILITOT 0.6 06/24/2011 0924   BILITOT 0.6 06/24/2011 0924       RADIOGRAPHIC STUDIES: No results found.  ASSESSMENT: This is a very pleasant 78 years old [place] American man with monoclonal gammopathy of undetermined significance currently on observation. The patient is currently asymptomatic. His myeloma panel is still pending for today.  PLAN: I recommended for the patient continuous observation for now with repeat CBC, comprehensive metabolic panel, LDH and myeloma panel in one year. I will call him as is any significant abnormalities of the blood work performed today. He knows to call me if  he has any concerning symptoms in the interval.   All questions were answered. The patient knows to call the clinic with any problems, questions or concerns. We can certainly see the patient much sooner if necessary.

## 2012-01-14 LAB — KAPPA/LAMBDA LIGHT CHAINS
Kappa free light chain: 1.88 mg/dL (ref 0.33–1.94)
Kappa:Lambda Ratio: 1.9 — ABNORMAL HIGH (ref 0.26–1.65)
Lambda Free Lght Chn: 0.99 mg/dL (ref 0.57–2.63)

## 2012-01-14 LAB — COMPREHENSIVE METABOLIC PANEL
ALT: 15 U/L (ref 0–53)
AST: 19 U/L (ref 0–37)
Albumin: 3.9 g/dL (ref 3.5–5.2)
Alkaline Phosphatase: 58 U/L (ref 39–117)
BUN: 12 mg/dL (ref 6–23)
CO2: 27 mEq/L (ref 19–32)
Calcium: 8.8 mg/dL (ref 8.4–10.5)
Chloride: 105 mEq/L (ref 96–112)
Creatinine, Ser: 1.05 mg/dL (ref 0.50–1.35)
Glucose, Bld: 130 mg/dL — ABNORMAL HIGH (ref 70–99)
Potassium: 3.6 mEq/L (ref 3.5–5.3)
Sodium: 140 mEq/L (ref 135–145)
Total Bilirubin: 0.6 mg/dL (ref 0.3–1.2)
Total Protein: 6.9 g/dL (ref 6.0–8.3)

## 2012-01-14 LAB — BETA 2 MICROGLOBULIN, SERUM: Beta-2 Microglobulin: 1.81 mg/L — ABNORMAL HIGH (ref 1.01–1.73)

## 2012-01-14 LAB — IGG, IGA, IGM
IgA: 145 mg/dL (ref 68–379)
IgG (Immunoglobin G), Serum: 1460 mg/dL (ref 650–1600)
IgM, Serum: 42 mg/dL (ref 41–251)

## 2012-01-14 LAB — LACTATE DEHYDROGENASE: LDH: 116 U/L (ref 94–250)

## 2012-02-18 DIAGNOSIS — D649 Anemia, unspecified: Secondary | ICD-10-CM | POA: Diagnosis not present

## 2012-02-18 DIAGNOSIS — I10 Essential (primary) hypertension: Secondary | ICD-10-CM | POA: Diagnosis not present

## 2012-02-18 DIAGNOSIS — E7800 Pure hypercholesterolemia, unspecified: Secondary | ICD-10-CM | POA: Diagnosis not present

## 2012-02-23 DIAGNOSIS — H6120 Impacted cerumen, unspecified ear: Secondary | ICD-10-CM | POA: Diagnosis not present

## 2012-02-23 DIAGNOSIS — I10 Essential (primary) hypertension: Secondary | ICD-10-CM | POA: Diagnosis not present

## 2012-02-23 DIAGNOSIS — D472 Monoclonal gammopathy: Secondary | ICD-10-CM | POA: Diagnosis not present

## 2012-02-23 DIAGNOSIS — E785 Hyperlipidemia, unspecified: Secondary | ICD-10-CM | POA: Diagnosis not present

## 2012-03-25 DIAGNOSIS — M719 Bursopathy, unspecified: Secondary | ICD-10-CM | POA: Diagnosis not present

## 2012-03-25 DIAGNOSIS — M67919 Unspecified disorder of synovium and tendon, unspecified shoulder: Secondary | ICD-10-CM | POA: Diagnosis not present

## 2012-03-25 DIAGNOSIS — M25519 Pain in unspecified shoulder: Secondary | ICD-10-CM | POA: Diagnosis not present

## 2012-07-12 ENCOUNTER — Other Ambulatory Visit (HOSPITAL_BASED_OUTPATIENT_CLINIC_OR_DEPARTMENT_OTHER): Payer: Medicare Other | Admitting: Lab

## 2012-07-12 DIAGNOSIS — D472 Monoclonal gammopathy: Secondary | ICD-10-CM | POA: Diagnosis not present

## 2012-07-12 LAB — CBC WITH DIFFERENTIAL/PLATELET
BASO%: 0.4 % (ref 0.0–2.0)
Basophils Absolute: 0 10*3/uL (ref 0.0–0.1)
EOS%: 2.5 % (ref 0.0–7.0)
Eosinophils Absolute: 0.1 10*3/uL (ref 0.0–0.5)
HCT: 37.8 % — ABNORMAL LOW (ref 38.4–49.9)
HGB: 12 g/dL — ABNORMAL LOW (ref 13.0–17.1)
LYMPH%: 25.5 % (ref 14.0–49.0)
MCH: 25.2 pg — ABNORMAL LOW (ref 27.2–33.4)
MCHC: 31.7 g/dL — ABNORMAL LOW (ref 32.0–36.0)
MCV: 79.4 fL (ref 79.3–98.0)
MONO#: 0.3 10*3/uL (ref 0.1–0.9)
MONO%: 6.8 % (ref 0.0–14.0)
NEUT#: 3 10*3/uL (ref 1.5–6.5)
NEUT%: 64.8 % (ref 39.0–75.0)
Platelets: 171 10*3/uL (ref 140–400)
RBC: 4.77 10*6/uL (ref 4.20–5.82)
RDW: 12.9 % (ref 11.0–14.6)
WBC: 4.6 10*3/uL (ref 4.0–10.3)
lymph#: 1.2 10*3/uL (ref 0.9–3.3)

## 2012-07-14 ENCOUNTER — Telehealth: Payer: Self-pay | Admitting: Internal Medicine

## 2012-07-14 ENCOUNTER — Ambulatory Visit (HOSPITAL_BASED_OUTPATIENT_CLINIC_OR_DEPARTMENT_OTHER): Payer: Medicare Other | Admitting: Internal Medicine

## 2012-07-14 VITALS — BP 137/69 | HR 61 | Temp 98.0°F | Resp 20 | Ht 72.0 in | Wt 217.4 lb

## 2012-07-14 DIAGNOSIS — D509 Iron deficiency anemia, unspecified: Secondary | ICD-10-CM | POA: Diagnosis not present

## 2012-07-14 DIAGNOSIS — D472 Monoclonal gammopathy: Secondary | ICD-10-CM

## 2012-07-14 LAB — BETA 2 MICROGLOBULIN, SERUM: Beta-2 Microglobulin: 1.76 mg/L — ABNORMAL HIGH (ref 1.01–1.73)

## 2012-07-14 LAB — COMPREHENSIVE METABOLIC PANEL
ALT: 13 U/L (ref 0–53)
AST: 19 U/L (ref 0–37)
Albumin: 3.8 g/dL (ref 3.5–5.2)
Alkaline Phosphatase: 55 U/L (ref 39–117)
BUN: 16 mg/dL (ref 6–23)
CO2: 30 mEq/L (ref 19–32)
Calcium: 9.2 mg/dL (ref 8.4–10.5)
Chloride: 104 mEq/L (ref 96–112)
Creatinine, Ser: 1.12 mg/dL (ref 0.50–1.35)
Glucose, Bld: 116 mg/dL — ABNORMAL HIGH (ref 70–99)
Potassium: 3.7 mEq/L (ref 3.5–5.3)
Sodium: 140 mEq/L (ref 135–145)
Total Bilirubin: 0.7 mg/dL (ref 0.3–1.2)
Total Protein: 6.9 g/dL (ref 6.0–8.3)

## 2012-07-14 LAB — SPEP & IFE WITH QIG
Albumin ELP: 56.1 % (ref 55.8–66.1)
Alpha-1-Globulin: 3.5 % (ref 2.9–4.9)
Alpha-2-Globulin: 9.8 % (ref 7.1–11.8)
Beta 2: 3.6 % (ref 3.2–6.5)
Beta Globulin: 5.1 % (ref 4.7–7.2)
Gamma Globulin: 21.9 % — ABNORMAL HIGH (ref 11.1–18.8)
IgA: 145 mg/dL (ref 68–379)
IgG (Immunoglobin G), Serum: 1840 mg/dL — ABNORMAL HIGH (ref 650–1600)
IgM, Serum: 43 mg/dL (ref 41–251)
M-Spike, %: 0.87 g/dL
Total Protein, Serum Electrophoresis: 6.9 g/dL (ref 6.0–8.3)

## 2012-07-14 LAB — LACTATE DEHYDROGENASE: LDH: 109 U/L (ref 94–250)

## 2012-07-14 LAB — KAPPA/LAMBDA LIGHT CHAINS
Kappa free light chain: 2.11 mg/dL — ABNORMAL HIGH (ref 0.33–1.94)
Kappa:Lambda Ratio: 1.41 (ref 0.26–1.65)
Lambda Free Lght Chn: 1.5 mg/dL (ref 0.57–2.63)

## 2012-07-14 NOTE — Progress Notes (Signed)
      [facility] [phone]   [phone]  OFFICE PROGRESS NOTE  PRINCIPAL DIAGNOSIS: Monoclonal gammopathy of undetermined significance in addition to mild iron deficiency anemia.   CURRENT THERAPY: Observation.  INTERVAL HISTORY: Joe [name] 78 y.o. male returns to the clinic today for six-month followup visit. The patient is doing fine with no specific complaints today. He denied having any significant weight loss or night sweats. He denied having any bleeding issues. He has no significant chest pain, shortness breath, cough or hemoptysis. The patient has repeat CBC, comprehensive metabolic panel, LDH and myeloma panel performed recently and he is here today for evaluation and discussion of his lab results.  ALLERGIES:   has no known allergies.  MEDICATIONS:  Current Outpatient Prescriptions  Medication Sig Dispense Refill  . doxazosin (CARDURA) 4 MG tablet Take 4 mg by mouth at bedtime.      . dutasteride (AVODART) 0.5 MG capsule Take 0.5 mg by mouth daily.      . fosinopril (MONOPRIL) 40 MG tablet Take 40 mg by mouth daily.      . indapamide (LOZOL) 2.5 MG tablet Take 2.5 mg by mouth every morning.      . pravastatin (PRAVACHOL) 40 MG tablet Take 40 mg by mouth daily.        REVIEW OF SYSTEMS:  A comprehensive review of systems was negative.   PHYSICAL EXAMINATION: General appearance: alert, cooperative and no distress Head: Normocephalic, without obvious abnormality, atraumatic Lymph nodes: Cervical, supraclavicular, and axillary nodes normal. Resp: clear to auscultation bilaterally Cardio: regular rate and rhythm, S1, S2 normal, no murmur, click, rub or gallop GI: soft, non-tender; bowel sounds normal; no masses,  no organomegaly Extremities: extremities normal, atraumatic, no cyanosis or edema  ECOG PERFORMANCE STATUS: 0 - Asymptomatic  Blood pressure 137/69, pulse 61, temperature 98 F (36.7 C), temperature source Oral, resp. rate 20,  height 6' (1.829 m), weight 217 lb 6.4 oz (98.612 kg).  LABORATORY DATA: Lab Results  Component Value Date   WBC 4.6 07/12/2012   HGB 12.0* 07/12/2012   HCT 37.8* 07/12/2012   MCV 79.4 07/12/2012   PLT 171 07/12/2012      Chemistry      Component Value Date/Time   NA 140 07/12/2012 0934   K 3.7 07/12/2012 0934   CL 104 07/12/2012 0934   CO2 30 07/12/2012 0934   BUN 16 07/12/2012 0934   CREATININE 1.12 07/12/2012 0934      Component Value Date/Time   CALCIUM 9.2 07/12/2012 0934   ALKPHOS 55 07/12/2012 0934   AST 19 07/12/2012 0934   ALT 13 07/12/2012 0934   BILITOT 0.7 07/12/2012 0934       RADIOGRAPHIC STUDIES: No results found.  ASSESSMENT: This is a very pleasant 78 years old African American male with monoclonal gammopathy of undetermined significance (MGUS). He is currently on observation with no evidence for disease progression on the recent blood work.  PLAN: I discussed the lab result with the patient and recommended for him to continue on observation for now. I would see him back for followup visit in 6 months with repeat CBC, comprehensive metabolic panel, LDH and myeloma panel. He was advised to call me immediately if he has any concerning symptoms in the interval.  All questions were answered. The patient knows to call the clinic with any problems, questions or concerns. We can certainly see the patient much sooner if necessary.

## 2012-07-14 NOTE — Telephone Encounter (Signed)
appts made and printed for pt aom °

## 2012-08-23 DIAGNOSIS — M25519 Pain in unspecified shoulder: Secondary | ICD-10-CM | POA: Diagnosis not present

## 2012-08-23 DIAGNOSIS — M719 Bursopathy, unspecified: Secondary | ICD-10-CM | POA: Diagnosis not present

## 2012-08-23 DIAGNOSIS — M67919 Unspecified disorder of synovium and tendon, unspecified shoulder: Secondary | ICD-10-CM | POA: Diagnosis not present

## 2012-09-05 DIAGNOSIS — C61 Malignant neoplasm of prostate: Secondary | ICD-10-CM | POA: Diagnosis not present

## 2012-09-12 DIAGNOSIS — C61 Malignant neoplasm of prostate: Secondary | ICD-10-CM | POA: Diagnosis not present

## 2012-10-05 DIAGNOSIS — Z4420 Encounter for fitting and adjustment of artificial eye, unspecified: Secondary | ICD-10-CM | POA: Diagnosis not present

## 2012-10-05 DIAGNOSIS — H25019 Cortical age-related cataract, unspecified eye: Secondary | ICD-10-CM | POA: Diagnosis not present

## 2012-10-05 DIAGNOSIS — H5210 Myopia, unspecified eye: Secondary | ICD-10-CM | POA: Diagnosis not present

## 2012-10-24 DIAGNOSIS — D2310 Other benign neoplasm of skin of unspecified eyelid, including canthus: Secondary | ICD-10-CM | POA: Diagnosis not present

## 2012-12-01 DIAGNOSIS — M67919 Unspecified disorder of synovium and tendon, unspecified shoulder: Secondary | ICD-10-CM | POA: Diagnosis not present

## 2012-12-01 DIAGNOSIS — M19019 Primary osteoarthritis, unspecified shoulder: Secondary | ICD-10-CM | POA: Diagnosis not present

## 2012-12-01 DIAGNOSIS — M25519 Pain in unspecified shoulder: Secondary | ICD-10-CM | POA: Diagnosis not present

## 2013-01-10 ENCOUNTER — Other Ambulatory Visit: Payer: Medicare Other | Admitting: Lab

## 2013-01-11 ENCOUNTER — Telehealth: Payer: Self-pay | Admitting: Internal Medicine

## 2013-01-12 ENCOUNTER — Ambulatory Visit: Payer: Medicare Other | Admitting: Internal Medicine

## 2013-01-16 ENCOUNTER — Other Ambulatory Visit: Payer: Self-pay | Admitting: Medical Oncology

## 2013-01-16 ENCOUNTER — Other Ambulatory Visit (HOSPITAL_BASED_OUTPATIENT_CLINIC_OR_DEPARTMENT_OTHER): Payer: Medicare Other | Admitting: Lab

## 2013-01-16 DIAGNOSIS — D649 Anemia, unspecified: Secondary | ICD-10-CM

## 2013-01-16 DIAGNOSIS — D472 Monoclonal gammopathy: Secondary | ICD-10-CM

## 2013-01-16 LAB — CBC & DIFF AND RETIC
BASO%: 0.2 % (ref 0.0–2.0)
Basophils Absolute: 0 10*3/uL (ref 0.0–0.1)
EOS%: 2.4 % (ref 0.0–7.0)
Eosinophils Absolute: 0.1 10*3/uL (ref 0.0–0.5)
HCT: 38.4 % (ref 38.4–49.9)
HGB: 12.1 g/dL — ABNORMAL LOW (ref 13.0–17.1)
Immature Retic Fract: 14.8 % — ABNORMAL HIGH (ref 3.00–10.60)
LYMPH%: 27.8 % (ref 14.0–49.0)
MCH: 25 pg — ABNORMAL LOW (ref 27.2–33.4)
MCHC: 31.5 g/dL — ABNORMAL LOW (ref 32.0–36.0)
MCV: 79.3 fL (ref 79.3–98.0)
MONO#: 0.3 10*3/uL (ref 0.1–0.9)
MONO%: 6.8 % (ref 0.0–14.0)
NEUT#: 2.9 10*3/uL (ref 1.5–6.5)
NEUT%: 62.8 % (ref 39.0–75.0)
Platelets: 177 10*3/uL (ref 140–400)
RBC: 4.84 10*6/uL (ref 4.20–5.82)
RDW: 13.3 % (ref 11.0–14.6)
Retic %: 1.03 % (ref 0.80–1.80)
Retic Ct Abs: 49.85 10*3/uL (ref 34.80–93.90)
WBC: 4.5 10*3/uL (ref 4.0–10.3)
lymph#: 1.3 10*3/uL (ref 0.9–3.3)

## 2013-01-16 LAB — LACTATE DEHYDROGENASE (CC13): LDH: 176 U/L (ref 125–245)

## 2013-01-18 ENCOUNTER — Encounter: Payer: Self-pay | Admitting: Internal Medicine

## 2013-01-18 ENCOUNTER — Telehealth: Payer: Self-pay | Admitting: Internal Medicine

## 2013-01-18 ENCOUNTER — Ambulatory Visit (HOSPITAL_BASED_OUTPATIENT_CLINIC_OR_DEPARTMENT_OTHER): Payer: Medicare Other | Admitting: Internal Medicine

## 2013-01-18 VITALS — BP 140/73 | HR 68 | Temp 97.3°F | Resp 20 | Ht 72.0 in | Wt 222.7 lb

## 2013-01-18 DIAGNOSIS — D509 Iron deficiency anemia, unspecified: Secondary | ICD-10-CM

## 2013-01-18 DIAGNOSIS — D472 Monoclonal gammopathy: Secondary | ICD-10-CM

## 2013-01-18 LAB — KAPPA/LAMBDA LIGHT CHAINS
Kappa free light chain: 1.96 mg/dL — ABNORMAL HIGH (ref 0.33–1.94)
Kappa:Lambda Ratio: 1.62 (ref 0.26–1.65)
Lambda Free Lght Chn: 1.21 mg/dL (ref 0.57–2.63)

## 2013-01-18 LAB — IGG, IGA, IGM
IgA: 149 mg/dL (ref 68–379)
IgG (Immunoglobin G), Serum: 1600 mg/dL (ref 650–1600)
IgM, Serum: 48 mg/dL (ref 41–251)

## 2013-01-18 LAB — BETA 2 MICROGLOBULIN, SERUM: Beta-2 Microglobulin: 1.54 mg/L (ref 1.01–1.73)

## 2013-01-18 NOTE — Progress Notes (Signed)
      [facility] [phone]   [phone]  OFFICE PROGRESS NOTE  Pearson Grippe, MD [address] [place] [address]  PRINCIPAL DIAGNOSIS: Monoclonal gammopathy of undetermined significance in addition to mild iron deficiency anemia.   CURRENT THERAPY: Observation.  INTERVAL HISTORY: Joe [name] 79 y.o. male returns to the clinic today for six-month followup visit. The patient is feeling fine today with no specific complaints except for mild sinus drainage. He denied having any significant chest pain, shortness breath, cough or hemoptysis. He denied having any significant weight loss or night sweats. He had repeat CBC, comprehensive metabolic panel, LDH and myeloma panel performed recently and he is here for evaluation and discussion of his lab results.  ALLERGIES:  has No Known Allergies.  MEDICATIONS:  Current Outpatient Prescriptions  Medication Sig Dispense Refill  . doxazosin (CARDURA) 4 MG tablet Take 4 mg by mouth at bedtime.      . dutasteride (AVODART) 0.5 MG capsule Take 0.5 mg by mouth daily.      . finasteride (PROSCAR) 5 MG tablet Take 5 mg by mouth daily.      . fosinopril (MONOPRIL) 40 MG tablet Take 40 mg by mouth daily.      . indapamide (LOZOL) 2.5 MG tablet Take 2.5 mg by mouth every morning.       No current facility-administered medications for this visit.    REVIEW OF SYSTEMS:  A comprehensive review of systems was negative.   PHYSICAL EXAMINATION: General appearance: alert, cooperative and no distress Head: Normocephalic, without obvious abnormality, atraumatic Neck: no adenopathy Resp: clear to auscultation bilaterally Cardio: regular rate and rhythm, S1, S2 normal, no murmur, click, rub or gallop GI: soft, non-tender; bowel sounds normal; no masses,  no organomegaly Extremities: extremities normal, atraumatic, no cyanosis or edema  ECOG PERFORMANCE STATUS: 0 - Asymptomatic  Blood pressure 140/73,  pulse 68, temperature 97.3 F (36.3 C), temperature source Oral, resp. rate 20, height 6' (1.829 m), weight 222 lb 11.2 oz (101.016 kg).  LABORATORY DATA: Lab Results  Component Value Date   WBC 4.5 01/16/2013   HGB 12.1* 01/16/2013   HCT 38.4 01/16/2013   MCV 79.3 01/16/2013   PLT 177 01/16/2013      Chemistry      Component Value Date/Time   NA 140 07/12/2012 0934   K 3.7 07/12/2012 0934   CL 104 07/12/2012 0934   CO2 30 07/12/2012 0934   BUN 16 07/12/2012 0934   CREATININE 1.12 07/12/2012 0934      Component Value Date/Time   CALCIUM 9.2 07/12/2012 0934   ALKPHOS 55 07/12/2012 0934   AST 19 07/12/2012 0934   ALT 13 07/12/2012 0934   BILITOT 0.7 07/12/2012 0934       RADIOGRAPHIC STUDIES: No results found.  ASSESSMENT: This is a very pleasant 79 years old African American male with history of monoclonal August of undetermined significance and has been observation for the last several years with no evidence for disease progression.  PLAN: I discussed the lab result with the patient today. I recommended for him to continue on observation with repeat myeloma panel in one year. He was advised to call me immediately if he has any concerning symptoms in the interval.  All questions were answered. The patient knows to call the clinic with any problems, questions or concerns. We can certainly see the patient much sooner if necessary.

## 2013-01-18 NOTE — Patient Instructions (Signed)
No evidence for disease progression on his recent bloodwork. Followup in one year with repeat myeloma panel.

## 2013-01-27 DIAGNOSIS — M67919 Unspecified disorder of synovium and tendon, unspecified shoulder: Secondary | ICD-10-CM | POA: Diagnosis not present

## 2013-01-27 DIAGNOSIS — M25519 Pain in unspecified shoulder: Secondary | ICD-10-CM | POA: Diagnosis not present

## 2013-02-23 DIAGNOSIS — D472 Monoclonal gammopathy: Secondary | ICD-10-CM | POA: Diagnosis not present

## 2013-02-23 DIAGNOSIS — N400 Enlarged prostate without lower urinary tract symptoms: Secondary | ICD-10-CM | POA: Diagnosis not present

## 2013-02-23 DIAGNOSIS — I10 Essential (primary) hypertension: Secondary | ICD-10-CM | POA: Diagnosis not present

## 2013-02-23 DIAGNOSIS — E785 Hyperlipidemia, unspecified: Secondary | ICD-10-CM | POA: Diagnosis not present

## 2013-02-23 DIAGNOSIS — H6120 Impacted cerumen, unspecified ear: Secondary | ICD-10-CM | POA: Diagnosis not present

## 2013-02-23 DIAGNOSIS — C61 Malignant neoplasm of prostate: Secondary | ICD-10-CM | POA: Diagnosis not present

## 2013-02-28 DIAGNOSIS — I10 Essential (primary) hypertension: Secondary | ICD-10-CM | POA: Diagnosis not present

## 2013-09-06 DIAGNOSIS — C61 Malignant neoplasm of prostate: Secondary | ICD-10-CM | POA: Diagnosis not present

## 2013-09-13 DIAGNOSIS — C61 Malignant neoplasm of prostate: Secondary | ICD-10-CM | POA: Diagnosis not present

## 2013-09-13 DIAGNOSIS — N390 Urinary tract infection, site not specified: Secondary | ICD-10-CM | POA: Diagnosis not present

## 2013-10-25 DIAGNOSIS — H25019 Cortical age-related cataract, unspecified eye: Secondary | ICD-10-CM | POA: Diagnosis not present

## 2013-10-25 DIAGNOSIS — H5210 Myopia, unspecified eye: Secondary | ICD-10-CM | POA: Diagnosis not present

## 2013-10-25 DIAGNOSIS — H52209 Unspecified astigmatism, unspecified eye: Secondary | ICD-10-CM | POA: Diagnosis not present

## 2014-01-09 DIAGNOSIS — M76899 Other specified enthesopathies of unspecified lower limb, excluding foot: Secondary | ICD-10-CM | POA: Diagnosis not present

## 2014-01-09 DIAGNOSIS — M25559 Pain in unspecified hip: Secondary | ICD-10-CM | POA: Diagnosis not present

## 2014-01-15 ENCOUNTER — Encounter (INDEPENDENT_AMBULATORY_CARE_PROVIDER_SITE_OTHER): Payer: Self-pay

## 2014-01-15 ENCOUNTER — Other Ambulatory Visit (HOSPITAL_BASED_OUTPATIENT_CLINIC_OR_DEPARTMENT_OTHER): Payer: Medicare Other

## 2014-01-15 DIAGNOSIS — D509 Iron deficiency anemia, unspecified: Secondary | ICD-10-CM

## 2014-01-15 DIAGNOSIS — D472 Monoclonal gammopathy: Secondary | ICD-10-CM | POA: Diagnosis not present

## 2014-01-15 LAB — CBC WITH DIFFERENTIAL/PLATELET
BASO%: 0.5 % (ref 0.0–2.0)
Basophils Absolute: 0 10*3/uL (ref 0.0–0.1)
EOS%: 2.6 % (ref 0.0–7.0)
Eosinophils Absolute: 0.2 10*3/uL (ref 0.0–0.5)
HCT: 39.7 % (ref 38.4–49.9)
HGB: 12.6 g/dL — ABNORMAL LOW (ref 13.0–17.1)
LYMPH%: 22.1 % (ref 14.0–49.0)
MCH: 25 pg — ABNORMAL LOW (ref 27.2–33.4)
MCHC: 31.7 g/dL — ABNORMAL LOW (ref 32.0–36.0)
MCV: 78.8 fL — ABNORMAL LOW (ref 79.3–98.0)
MONO#: 0.5 10*3/uL (ref 0.1–0.9)
MONO%: 8.1 % (ref 0.0–14.0)
NEUT#: 4 10*3/uL (ref 1.5–6.5)
NEUT%: 66.7 % (ref 39.0–75.0)
Platelets: 192 10*3/uL (ref 140–400)
RBC: 5.04 10*6/uL (ref 4.20–5.82)
RDW: 13.1 % (ref 11.0–14.6)
WBC: 6 10*3/uL (ref 4.0–10.3)
lymph#: 1.3 10*3/uL (ref 0.9–3.3)

## 2014-01-15 LAB — COMPREHENSIVE METABOLIC PANEL (CC13)
ALT: 20 U/L (ref 0–55)
AST: 19 U/L (ref 5–34)
Albumin: 3.8 g/dL (ref 3.5–5.0)
Alkaline Phosphatase: 62 U/L (ref 40–150)
Anion Gap: 7 mEq/L (ref 3–11)
BUN: 14.8 mg/dL (ref 7.0–26.0)
CO2: 29 mEq/L (ref 22–29)
Calcium: 9.5 mg/dL (ref 8.4–10.4)
Chloride: 106 mEq/L (ref 98–109)
Creatinine: 0.9 mg/dL (ref 0.7–1.3)
Glucose: 89 mg/dl (ref 70–140)
Potassium: 3.9 mEq/L (ref 3.5–5.1)
Sodium: 143 mEq/L (ref 136–145)
Total Bilirubin: 0.6 mg/dL (ref 0.20–1.20)
Total Protein: 6.8 g/dL (ref 6.4–8.3)

## 2014-01-15 LAB — LACTATE DEHYDROGENASE (CC13): LDH: 151 U/L (ref 125–245)

## 2014-01-16 LAB — IGG, IGA, IGM
IgA: 152 mg/dL (ref 68–379)
IgG (Immunoglobin G), Serum: 1400 mg/dL (ref 650–1600)
IgM, Serum: 39 mg/dL — ABNORMAL LOW (ref 41–251)

## 2014-01-16 LAB — KAPPA/LAMBDA LIGHT CHAINS
Kappa free light chain: 1.62 mg/dL (ref 0.33–1.94)
Kappa:Lambda Ratio: 1.57 (ref 0.26–1.65)
Lambda Free Lght Chn: 1.03 mg/dL (ref 0.57–2.63)

## 2014-01-16 LAB — BETA 2 MICROGLOBULIN, SERUM: Beta-2 Microglobulin: 1.5 mg/L (ref 1.01–1.73)

## 2014-01-17 ENCOUNTER — Encounter: Payer: Self-pay | Admitting: Internal Medicine

## 2014-01-17 ENCOUNTER — Telehealth: Payer: Self-pay | Admitting: Internal Medicine

## 2014-01-17 ENCOUNTER — Ambulatory Visit (HOSPITAL_BASED_OUTPATIENT_CLINIC_OR_DEPARTMENT_OTHER): Payer: Medicare Other | Admitting: Internal Medicine

## 2014-01-17 VITALS — BP 171/71 | HR 77 | Temp 98.4°F | Resp 18 | Ht 72.0 in | Wt 223.0 lb

## 2014-01-17 DIAGNOSIS — D472 Monoclonal gammopathy: Secondary | ICD-10-CM | POA: Diagnosis not present

## 2014-01-17 DIAGNOSIS — D509 Iron deficiency anemia, unspecified: Secondary | ICD-10-CM | POA: Diagnosis not present

## 2014-01-17 NOTE — Telephone Encounter (Signed)
Gave pt appt for lab and MD on February 2016 °

## 2014-01-17 NOTE — Progress Notes (Signed)
[place] [phone]   [phone]  OFFICE PROGRESS NOTE  Jani Gravel, MD [place] [address]  PRINCIPAL DIAGNOSIS: Monoclonal gammopathy of undetermined significance in addition to mild iron deficiency anemia.   CURRENT THERAPY: Observation.  INTERVAL HISTORY: Joe [name] 80 y.o. male returns to the clinic today for annual followup visit. The patient is feeling fine today with no specific complaints. He has a recent steroid injection on the right hip with improvement in the pain in that area. He denied having any significant chest pain, shortness of breath, cough or hemoptysis. He denied having any significant weight loss or night sweats. He had repeat CBC, comprehensive metabolic panel, LDH and myeloma panel performed recently and he is here for evaluation and discussion of his lab results.  ALLERGIES:  has No Known Allergies.  MEDICATIONS:  Current Outpatient Prescriptions  Medication Sig Dispense Refill  . doxazosin (CARDURA) 4 MG tablet Take 4 mg by mouth at bedtime.      . finasteride (PROSCAR) 5 MG tablet Take 5 mg by mouth daily.      . fosinopril (MONOPRIL) 40 MG tablet Take 40 mg by mouth daily.      . indapamide (LOZOL) 2.5 MG tablet Take 2.5 mg by mouth every other day.       . pravastatin (PRAVACHOL) 40 MG tablet Take 40 mg by mouth daily.       No current facility-administered medications for this visit.    REVIEW OF SYSTEMS:  A comprehensive review of systems was negative.   PHYSICAL EXAMINATION: General appearance: alert, cooperative and no distress Head: Normocephalic, without obvious abnormality, atraumatic Neck: no adenopathy Resp: clear to auscultation bilaterally Cardio: regular rate and rhythm, S1, S2 normal, no murmur, click, rub or gallop GI: soft, non-tender; bowel sounds normal; no masses,  no organomegaly Extremities: extremities normal, atraumatic, no cyanosis or edema  ECOG  PERFORMANCE STATUS: 0 - Asymptomatic  Blood pressure 171/71, pulse 77, temperature 98.4 F (36.9 C), temperature source Oral, resp. rate 18, height 6' (1.829 m), weight 223 lb (101.152 kg), SpO2 99.00%.  LABORATORY DATA: Lab Results  Component Value Date   WBC 6.0 01/15/2014   HGB 12.6* 01/15/2014   HCT 39.7 01/15/2014   MCV 78.8* 01/15/2014   PLT 192 01/15/2014      Chemistry      Component Value Date/Time   NA 143 01/15/2014 0934   NA 140 07/12/2012 0934   K 3.9 01/15/2014 0934   K 3.7 07/12/2012 0934   CL 104 07/12/2012 0934   CO2 29 01/15/2014 0934   CO2 30 07/12/2012 0934   BUN 14.8 01/15/2014 0934   BUN 16 07/12/2012 0934   CREATININE 0.9 01/15/2014 0934   CREATININE 1.12 07/12/2012 0934      Component Value Date/Time   CALCIUM 9.5 01/15/2014 0934   CALCIUM 9.2 07/12/2012 0934   ALKPHOS 62 01/15/2014 0934   ALKPHOS 55 07/12/2012 0934   AST 19 01/15/2014 0934   AST 19 07/12/2012 0934   ALT 20 01/15/2014 0934   ALT 13 07/12/2012 0934   BILITOT 0.60 01/15/2014 0934   BILITOT 0.7 07/12/2012 0934     Other studies: B2 microglobulin 1.50, free kappa light chain 1.62, free lambda light chain 1.03 with a And that ratio of 1.57. IgG 1400, IgA 152 and IgM 39.  RADIOGRAPHIC STUDIES: No results found.  ASSESSMENT: This is a very pleasant 80 years old African American  male with history of monoclonal August of undetermined significance and has been observation for several years with no evidence for disease progression. I discussed the lab result with the patient today. I recommended for him to continue on observation with repeat myeloma panel in one year. He was advised to call me immediately if he has any concerning symptoms in the interval.  All questions were answered. The patient knows to call the clinic with any problems, questions or concerns. We can certainly see the patient much sooner if necessary.  Disclaimer: This note was dictated with voice recognition software. Similar sounding  words can inadvertently be transcribed and may not be corrected upon review.

## 2014-02-21 DIAGNOSIS — I10 Essential (primary) hypertension: Secondary | ICD-10-CM | POA: Diagnosis not present

## 2014-03-07 DIAGNOSIS — H6120 Impacted cerumen, unspecified ear: Secondary | ICD-10-CM | POA: Diagnosis not present

## 2014-03-07 DIAGNOSIS — E785 Hyperlipidemia, unspecified: Secondary | ICD-10-CM | POA: Diagnosis not present

## 2014-03-07 DIAGNOSIS — I10 Essential (primary) hypertension: Secondary | ICD-10-CM | POA: Diagnosis not present

## 2014-03-07 DIAGNOSIS — D472 Monoclonal gammopathy: Secondary | ICD-10-CM | POA: Diagnosis not present

## 2014-03-19 DIAGNOSIS — Z1211 Encounter for screening for malignant neoplasm of colon: Secondary | ICD-10-CM | POA: Diagnosis not present

## 2014-03-19 DIAGNOSIS — I10 Essential (primary) hypertension: Secondary | ICD-10-CM | POA: Diagnosis not present

## 2014-09-07 DIAGNOSIS — C61 Malignant neoplasm of prostate: Secondary | ICD-10-CM | POA: Diagnosis not present

## 2014-09-14 DIAGNOSIS — C61 Malignant neoplasm of prostate: Secondary | ICD-10-CM | POA: Diagnosis not present

## 2014-10-30 DIAGNOSIS — H2512 Age-related nuclear cataract, left eye: Secondary | ICD-10-CM | POA: Diagnosis not present

## 2014-10-30 DIAGNOSIS — H5441 Blindness, right eye, normal vision left eye: Secondary | ICD-10-CM | POA: Diagnosis not present

## 2015-01-11 ENCOUNTER — Other Ambulatory Visit (HOSPITAL_BASED_OUTPATIENT_CLINIC_OR_DEPARTMENT_OTHER): Payer: Medicare Other

## 2015-01-11 DIAGNOSIS — D472 Monoclonal gammopathy: Secondary | ICD-10-CM

## 2015-01-11 DIAGNOSIS — D509 Iron deficiency anemia, unspecified: Secondary | ICD-10-CM | POA: Diagnosis not present

## 2015-01-11 LAB — COMPREHENSIVE METABOLIC PANEL (CC13)
ALT: 15 U/L (ref 0–55)
AST: 21 U/L (ref 5–34)
Albumin: 3.7 g/dL (ref 3.5–5.0)
Alkaline Phosphatase: 60 U/L (ref 40–150)
Anion Gap: 9 mEq/L (ref 3–11)
BUN: 16.9 mg/dL (ref 7.0–26.0)
CO2: 27 mEq/L (ref 22–29)
Calcium: 9.1 mg/dL (ref 8.4–10.4)
Chloride: 103 mEq/L (ref 98–109)
Creatinine: 1.1 mg/dL (ref 0.7–1.3)
EGFR: 72 mL/min/{1.73_m2} — ABNORMAL LOW (ref >90)
Glucose: 149 mg/dl — ABNORMAL HIGH (ref 70–140)
Potassium: 3.6 mEq/L (ref 3.5–5.1)
Sodium: 140 mEq/L (ref 136–145)
Total Bilirubin: 0.58 mg/dL (ref 0.20–1.20)
Total Protein: 6.9 g/dL (ref 6.4–8.3)

## 2015-01-11 LAB — CBC WITH DIFFERENTIAL/PLATELET
BASO%: 0.6 % (ref 0.0–2.0)
Basophils Absolute: 0 10*3/uL (ref 0.0–0.1)
EOS%: 2.3 % (ref 0.0–7.0)
Eosinophils Absolute: 0.1 10*3/uL (ref 0.0–0.5)
HCT: 38 % — ABNORMAL LOW (ref 38.4–49.9)
HGB: 11.6 g/dL — ABNORMAL LOW (ref 13.0–17.1)
LYMPH%: 26.3 % (ref 14.0–49.0)
MCH: 23.8 pg — ABNORMAL LOW (ref 27.2–33.4)
MCHC: 30.6 g/dL — ABNORMAL LOW (ref 32.0–36.0)
MCV: 77.6 fL — ABNORMAL LOW (ref 79.3–98.0)
MONO#: 0.3 10*3/uL (ref 0.1–0.9)
MONO%: 6.5 % (ref 0.0–14.0)
NEUT#: 3 10*3/uL (ref 1.5–6.5)
NEUT%: 64.3 % (ref 39.0–75.0)
Platelets: 186 10*3/uL (ref 140–400)
RBC: 4.89 10*6/uL (ref 4.20–5.82)
RDW: 12.9 % (ref 11.0–14.6)
WBC: 4.6 10*3/uL (ref 4.0–10.3)
lymph#: 1.2 10*3/uL (ref 0.9–3.3)

## 2015-01-11 LAB — LACTATE DEHYDROGENASE (CC13): LDH: 140 U/L (ref 125–245)

## 2015-01-15 LAB — IGG, IGA, IGM
IgA: 152 mg/dL (ref 68–379)
IgG (Immunoglobin G), Serum: 1760 mg/dL — ABNORMAL HIGH (ref 650–1600)
IgM, Serum: 47 mg/dL (ref 41–251)

## 2015-01-15 LAB — BETA 2 MICROGLOBULIN, SERUM: Beta-2 Microglobulin: 2 mg/L (ref <=2.51)

## 2015-01-15 LAB — KAPPA/LAMBDA LIGHT CHAINS
Kappa free light chain: 2.42 mg/dL — ABNORMAL HIGH (ref 0.33–1.94)
Kappa:Lambda Ratio: 2.09 — ABNORMAL HIGH (ref 0.26–1.65)
Lambda Free Lght Chn: 1.16 mg/dL (ref 0.57–2.63)

## 2015-01-17 ENCOUNTER — Ambulatory Visit (HOSPITAL_BASED_OUTPATIENT_CLINIC_OR_DEPARTMENT_OTHER): Payer: Medicare Other | Admitting: Internal Medicine

## 2015-01-17 ENCOUNTER — Encounter: Payer: Self-pay | Admitting: Internal Medicine

## 2015-01-17 ENCOUNTER — Telehealth: Payer: Self-pay | Admitting: Internal Medicine

## 2015-01-17 VITALS — BP 149/68 | HR 78 | Temp 97.8°F | Resp 18 | Ht 72.0 in | Wt 222.0 lb

## 2015-01-17 DIAGNOSIS — D472 Monoclonal gammopathy: Secondary | ICD-10-CM | POA: Diagnosis not present

## 2015-01-17 DIAGNOSIS — D509 Iron deficiency anemia, unspecified: Secondary | ICD-10-CM | POA: Diagnosis not present

## 2015-01-17 NOTE — Telephone Encounter (Signed)
gv adn printed appt sched and avs for pt for Feb 2017 °

## 2015-01-17 NOTE — Progress Notes (Signed)
[place] Telephone:([phone]   [phone]  OFFICE PROGRESS NOTE  Jani Gravel, MD [place] [address]  PRINCIPAL DIAGNOSIS: Monoclonal gammopathy of undetermined significance in addition to mild iron deficiency anemia.   CURRENT THERAPY: Observation.  INTERVAL HISTORY: Joe [name] 81 y.o. male returns to the clinic today for annual followup visit. The patient is feeling fine today with no specific complaints. He denied having any significant chest pain, shortness of breath, cough or hemoptysis. He denied having any significant weight loss or night sweats. He had repeat myeloma panel performed recently and he is here for evaluation and discussion of his lab results.  ALLERGIES:  has No Known Allergies.  MEDICATIONS:  Current Outpatient Prescriptions  Medication Sig Dispense Refill  . doxazosin (CARDURA) 4 MG tablet Take 4 mg by mouth at bedtime.    . finasteride (PROSCAR) 5 MG tablet Take 5 mg by mouth daily.    . fosinopril (MONOPRIL) 40 MG tablet Take 40 mg by mouth daily.    . indapamide (LOZOL) 2.5 MG tablet Take 2.5 mg by mouth every other day.      No current facility-administered medications for this visit.    REVIEW OF SYSTEMS:  A comprehensive review of systems was negative.   PHYSICAL EXAMINATION: General appearance: alert, cooperative and no distress Head: Normocephalic, without obvious abnormality, atraumatic Neck: no adenopathy Resp: clear to auscultation bilaterally Cardio: regular rate and rhythm, S1, S2 normal, no murmur, click, rub or gallop GI: soft, non-tender; bowel sounds normal; no masses,  no organomegaly Extremities: extremities normal, atraumatic, no cyanosis or edema  ECOG PERFORMANCE STATUS: 0 - Asymptomatic  Blood pressure 149/68, pulse 78, temperature 97.8 F (36.6 C), temperature source Oral, resp. rate 18, height 6' (1.829 m), weight 222 lb (100.699 kg), SpO2 100 %.  LABORATORY  DATA: Lab Results  Component Value Date   WBC 4.6 01/11/2015   HGB 11.6* 01/11/2015   HCT 38.0* 01/11/2015   MCV 77.6* 01/11/2015   PLT 186 01/11/2015      Chemistry      Component Value Date/Time   NA 140 01/11/2015 0953   NA 140 07/12/2012 0934   K 3.6 01/11/2015 0953   K 3.7 07/12/2012 0934   CL 104 07/12/2012 0934   CO2 27 01/11/2015 0953   CO2 30 07/12/2012 0934   BUN 16.9 01/11/2015 0953   BUN 16 07/12/2012 0934   CREATININE 1.1 01/11/2015 0953   CREATININE 1.12 07/12/2012 0934      Component Value Date/Time   CALCIUM 9.1 01/11/2015 0953   CALCIUM 9.2 07/12/2012 0934   ALKPHOS 60 01/11/2015 0953   ALKPHOS 55 07/12/2012 0934   AST 21 01/11/2015 0953   AST 19 07/12/2012 0934   ALT 15 01/11/2015 0953   ALT 13 07/12/2012 0934   BILITOT 0.58 01/11/2015 0953   BILITOT 0.7 07/12/2012 0934     Other studies: B2 microglobulin 2.00, free kappa light chain 2.42, free lambda light chain 1.16 with Kappa/Lambda ratio of 2.09. IgG 1760, IgA 152 and IgM 47.  RADIOGRAPHIC STUDIES: No results found.  ASSESSMENT: This is a very pleasant 81 years old African American male with history of monoclonal gammopathy of undetermined significance and has been observation for several years with no evidence for disease progression. The patient is also doing fine and currently asymptomatic. I discussed the lab result with the patient today. I recommended for him to continue on observation with repeat  myeloma panel in one year. He was advised to call me immediately if he has any concerning symptoms in the interval.  All questions were answered. The patient knows to call the clinic with any problems, questions or concerns. We can certainly see the patient much sooner if necessary.  Disclaimer: This note was dictated with voice recognition software. Similar sounding words can inadvertently be transcribed and may not be corrected upon review.

## 2015-03-08 DIAGNOSIS — E785 Hyperlipidemia, unspecified: Secondary | ICD-10-CM | POA: Diagnosis not present

## 2015-03-08 DIAGNOSIS — Z0000 Encounter for general adult medical examination without abnormal findings: Secondary | ICD-10-CM | POA: Diagnosis not present

## 2015-03-08 DIAGNOSIS — I10 Essential (primary) hypertension: Secondary | ICD-10-CM | POA: Diagnosis not present

## 2015-03-13 DIAGNOSIS — I10 Essential (primary) hypertension: Secondary | ICD-10-CM | POA: Diagnosis not present

## 2015-03-13 DIAGNOSIS — D649 Anemia, unspecified: Secondary | ICD-10-CM | POA: Diagnosis not present

## 2015-03-13 DIAGNOSIS — E780 Pure hypercholesterolemia: Secondary | ICD-10-CM | POA: Diagnosis not present

## 2015-03-13 DIAGNOSIS — H6120 Impacted cerumen, unspecified ear: Secondary | ICD-10-CM | POA: Diagnosis not present

## 2015-04-18 ENCOUNTER — Encounter: Payer: Self-pay | Admitting: Internal Medicine

## 2015-08-22 DIAGNOSIS — Z87440 Personal history of urinary (tract) infections: Secondary | ICD-10-CM | POA: Diagnosis not present

## 2015-08-22 DIAGNOSIS — N390 Urinary tract infection, site not specified: Secondary | ICD-10-CM | POA: Diagnosis not present

## 2015-09-12 DIAGNOSIS — C61 Malignant neoplasm of prostate: Secondary | ICD-10-CM | POA: Diagnosis not present

## 2015-09-13 DIAGNOSIS — D649 Anemia, unspecified: Secondary | ICD-10-CM | POA: Diagnosis not present

## 2015-09-13 DIAGNOSIS — I10 Essential (primary) hypertension: Secondary | ICD-10-CM | POA: Diagnosis not present

## 2015-09-17 DIAGNOSIS — D649 Anemia, unspecified: Secondary | ICD-10-CM | POA: Diagnosis not present

## 2015-09-17 DIAGNOSIS — E785 Hyperlipidemia, unspecified: Secondary | ICD-10-CM | POA: Diagnosis not present

## 2015-09-17 DIAGNOSIS — R739 Hyperglycemia, unspecified: Secondary | ICD-10-CM | POA: Diagnosis not present

## 2015-09-17 DIAGNOSIS — I10 Essential (primary) hypertension: Secondary | ICD-10-CM | POA: Diagnosis not present

## 2015-09-20 DIAGNOSIS — C61 Malignant neoplasm of prostate: Secondary | ICD-10-CM | POA: Diagnosis not present

## 2015-10-14 DIAGNOSIS — R51 Headache: Secondary | ICD-10-CM | POA: Diagnosis not present

## 2015-10-14 DIAGNOSIS — E032 Hypothyroidism due to medicaments and other exogenous substances: Secondary | ICD-10-CM | POA: Diagnosis not present

## 2015-10-14 DIAGNOSIS — L300 Nummular dermatitis: Secondary | ICD-10-CM | POA: Diagnosis not present

## 2015-11-11 DIAGNOSIS — H5212 Myopia, left eye: Secondary | ICD-10-CM | POA: Diagnosis not present

## 2015-11-11 DIAGNOSIS — H25012 Cortical age-related cataract, left eye: Secondary | ICD-10-CM | POA: Diagnosis not present

## 2015-12-12 DIAGNOSIS — R739 Hyperglycemia, unspecified: Secondary | ICD-10-CM | POA: Diagnosis not present

## 2015-12-12 DIAGNOSIS — E785 Hyperlipidemia, unspecified: Secondary | ICD-10-CM | POA: Diagnosis not present

## 2015-12-12 DIAGNOSIS — D649 Anemia, unspecified: Secondary | ICD-10-CM | POA: Diagnosis not present

## 2015-12-12 DIAGNOSIS — I10 Essential (primary) hypertension: Secondary | ICD-10-CM | POA: Diagnosis not present

## 2015-12-18 DIAGNOSIS — I10 Essential (primary) hypertension: Secondary | ICD-10-CM | POA: Diagnosis not present

## 2015-12-18 DIAGNOSIS — D649 Anemia, unspecified: Secondary | ICD-10-CM | POA: Diagnosis not present

## 2015-12-18 DIAGNOSIS — E785 Hyperlipidemia, unspecified: Secondary | ICD-10-CM | POA: Diagnosis not present

## 2015-12-18 DIAGNOSIS — N400 Enlarged prostate without lower urinary tract symptoms: Secondary | ICD-10-CM | POA: Diagnosis not present

## 2016-01-09 ENCOUNTER — Other Ambulatory Visit (HOSPITAL_BASED_OUTPATIENT_CLINIC_OR_DEPARTMENT_OTHER): Payer: Medicare Other

## 2016-01-09 DIAGNOSIS — D472 Monoclonal gammopathy: Secondary | ICD-10-CM | POA: Diagnosis not present

## 2016-01-09 LAB — CBC WITH DIFFERENTIAL/PLATELET
BASO%: 0.6 % (ref 0.0–2.0)
Basophils Absolute: 0 10*3/uL (ref 0.0–0.1)
EOS%: 2.5 % (ref 0.0–7.0)
Eosinophils Absolute: 0.1 10*3/uL (ref 0.0–0.5)
HCT: 38.4 % (ref 38.4–49.9)
HGB: 12 g/dL — ABNORMAL LOW (ref 13.0–17.1)
LYMPH%: 21 % (ref 14.0–49.0)
MCH: 24.5 pg — ABNORMAL LOW (ref 27.2–33.4)
MCHC: 31.2 g/dL — ABNORMAL LOW (ref 32.0–36.0)
MCV: 78.4 fL — ABNORMAL LOW (ref 79.3–98.0)
MONO#: 0.4 10*3/uL (ref 0.1–0.9)
MONO%: 7 % (ref 0.0–14.0)
NEUT#: 3.8 10*3/uL (ref 1.5–6.5)
NEUT%: 68.9 % (ref 39.0–75.0)
Platelets: 187 10*3/uL (ref 140–400)
RBC: 4.9 10*6/uL (ref 4.20–5.82)
RDW: 13.2 % (ref 11.0–14.6)
WBC: 5.5 10*3/uL (ref 4.0–10.3)
lymph#: 1.2 10*3/uL (ref 0.9–3.3)

## 2016-01-09 LAB — COMPREHENSIVE METABOLIC PANEL
ALT: 15 U/L (ref 0–55)
AST: 18 U/L (ref 5–34)
Albumin: 3.7 g/dL (ref 3.5–5.0)
Alkaline Phosphatase: 60 U/L (ref 40–150)
Anion Gap: 8 mEq/L (ref 3–11)
BUN: 14.7 mg/dL (ref 7.0–26.0)
CO2: 27 mEq/L (ref 22–29)
Calcium: 9.2 mg/dL (ref 8.4–10.4)
Chloride: 105 mEq/L (ref 98–109)
Creatinine: 1 mg/dL (ref 0.7–1.3)
EGFR: 80 mL/min/{1.73_m2} — ABNORMAL LOW (ref >90)
Glucose: 89 mg/dl (ref 70–140)
Potassium: 3.8 mEq/L (ref 3.5–5.1)
Sodium: 140 mEq/L (ref 136–145)
Total Bilirubin: 0.51 mg/dL (ref 0.20–1.20)
Total Protein: 7.3 g/dL (ref 6.4–8.3)

## 2016-01-09 LAB — LACTATE DEHYDROGENASE: LDH: 136 U/L (ref 125–245)

## 2016-01-10 LAB — IGG, IGA, IGM
IgA, Qn, Serum: 137 mg/dL (ref 61–437)
IgG, Qn, Serum: 1517 mg/dL (ref 700–1600)
IgM, Qn, Serum: 40 mg/dL (ref 15–143)

## 2016-01-10 LAB — KAPPA/LAMBDA LIGHT CHAINS
Ig Kappa Free Light Chain: 26.17 mg/L — ABNORMAL HIGH (ref 3.30–19.40)
Ig Lambda Free Light Chain: 11.11 mg/L (ref 5.71–26.30)
Kappa/Lambda FluidC Ratio: 2.36 — ABNORMAL HIGH (ref 0.26–1.65)

## 2016-01-10 LAB — BETA 2 MICROGLOBULIN, SERUM: Beta-2: 1.9 mg/L (ref 0.6–2.4)

## 2016-01-16 ENCOUNTER — Ambulatory Visit (HOSPITAL_BASED_OUTPATIENT_CLINIC_OR_DEPARTMENT_OTHER): Payer: Medicare Other | Admitting: Internal Medicine

## 2016-01-16 ENCOUNTER — Encounter: Payer: Self-pay | Admitting: Internal Medicine

## 2016-01-16 VITALS — BP 159/72 | HR 75 | Temp 97.9°F | Resp 18 | Ht 72.0 in | Wt 218.7 lb

## 2016-01-16 DIAGNOSIS — D509 Iron deficiency anemia, unspecified: Secondary | ICD-10-CM

## 2016-01-16 DIAGNOSIS — D472 Monoclonal gammopathy: Secondary | ICD-10-CM

## 2016-01-16 NOTE — Progress Notes (Signed)
[place] [phone]   Fax:([phone]  OFFICE PROGRESS NOTE  Jani Gravel, MD [address] [place] [address]  PRINCIPAL DIAGNOSIS: Monoclonal gammopathy of undetermined significance in addition to mild iron deficiency anemia.   CURRENT THERAPY: Observation.  INTERVAL HISTORY: Joe [name] 82 y.o. male returns to the clinic today for annual followup visit. The patient is feeling fine today with no specific complaints. He has been observation for several years and has been doing very well. He denied having any significant chest pain, shortness of breath, cough or hemoptysis. He denied having any significant weight loss or night sweats. He had repeat myeloma panel performed recently and he is here for evaluation and discussion of his lab results.  ALLERGIES:  has No Known Allergies.   MEDICATIONS:  Current Outpatient Prescriptions  Medication Sig Dispense Refill  . doxazosin (CARDURA) 4 MG tablet Take 4 mg by mouth at bedtime.    . finasteride (PROSCAR) 5 MG tablet Take 5 mg by mouth daily.    . fosinopril (MONOPRIL) 40 MG tablet Take 40 mg by mouth daily.    . indapamide (LOZOL) 2.5 MG tablet Take 2.5 mg by mouth every other day.      No current facility-administered medications for this visit.    REVIEW OF SYSTEMS:  A comprehensive review of systems was negative.   PHYSICAL EXAMINATION: General appearance: alert, cooperative and no distress Head: Normocephalic, without obvious abnormality, atraumatic Neck: no adenopathy Resp: clear to auscultation bilaterally Cardio: regular rate and rhythm, S1, S2 normal, no murmur, click, rub or gallop GI: soft, non-tender; bowel sounds normal; no masses,  no organomegaly Extremities: extremities normal, atraumatic, no cyanosis or edema  ECOG PERFORMANCE STATUS: 0 - Asymptomatic  Blood pressure 159/72, pulse 75, temperature 97.9 F (36.6 C), temperature source Oral, resp. rate 18,  height 6' (1.829 m), weight 218 lb 11.2 oz (99.202 kg), SpO2 100 %.  LABORATORY DATA: Lab Results  Component Value Date   WBC 5.5 01/09/2016   HGB 12.0* 01/09/2016   HCT 38.4 01/09/2016   MCV 78.4* 01/09/2016   PLT 187 01/09/2016      Chemistry      Component Value Date/Time   NA 140 01/09/2016 1045   NA 140 07/12/2012 0934   K 3.8 01/09/2016 1045   K 3.7 07/12/2012 0934   CL 104 07/12/2012 0934   CO2 27 01/09/2016 1045   CO2 30 07/12/2012 0934   BUN 14.7 01/09/2016 1045   BUN 16 07/12/2012 0934   CREATININE 1.0 01/09/2016 1045   CREATININE 1.12 07/12/2012 0934      Component Value Date/Time   CALCIUM 9.2 01/09/2016 1045   CALCIUM 9.2 07/12/2012 0934   ALKPHOS 60 01/09/2016 1045   ALKPHOS 55 07/12/2012 0934   AST 18 01/09/2016 1045   AST 19 07/12/2012 0934   ALT 15 01/09/2016 1045   ALT 13 07/12/2012 0934   BILITOT 0.51 01/09/2016 1045   BILITOT 0.7 07/12/2012 0934     Other studies: B2 microglobulin 1.90, free kappa light chain 26.17, free lambda light chain 11.11 with Kappa/Lambda ratio of 2.36. IgG 1517, IgA 137 and IgM 40.  RADIOGRAPHIC STUDIES: No results found.  ASSESSMENT: This is a very pleasant 82 years old [place] American male with history of monoclonal gammopathy of undetermined significance. The patient has been observation for several years with no evidence for disease progression. The patient is also doing fine and currently asymptomatic. The recent  myeloma panel showed no concerning finding for disease progression. I discussed the lab result with the patient today. I recommended for him to continue on observation with routine follow-up visit with his primary care physician at this point. I would be happy to see him in the future if needed. For the mild anemia, I advised the patient to take oral iron tablets over the counter 1-2 tablets every day. He was advised to call me immediately if he has any concerning symptoms in the interval.  All questions  were answered. The patient knows to call the clinic with any problems, questions or concerns. We can certainly see the patient much sooner if necessary.  Disclaimer: This note was dictated with voice recognition software. Similar sounding words can inadvertently be transcribed and may not be corrected upon review.

## 2016-06-01 DIAGNOSIS — H2512 Age-related nuclear cataract, left eye: Secondary | ICD-10-CM | POA: Diagnosis not present

## 2016-06-11 DIAGNOSIS — Z79899 Other long term (current) drug therapy: Secondary | ICD-10-CM | POA: Diagnosis not present

## 2016-06-11 DIAGNOSIS — I10 Essential (primary) hypertension: Secondary | ICD-10-CM | POA: Diagnosis not present

## 2016-06-11 DIAGNOSIS — N400 Enlarged prostate without lower urinary tract symptoms: Secondary | ICD-10-CM | POA: Diagnosis not present

## 2016-06-11 DIAGNOSIS — Z125 Encounter for screening for malignant neoplasm of prostate: Secondary | ICD-10-CM | POA: Diagnosis not present

## 2016-06-11 DIAGNOSIS — D649 Anemia, unspecified: Secondary | ICD-10-CM | POA: Diagnosis not present

## 2016-06-11 DIAGNOSIS — E785 Hyperlipidemia, unspecified: Secondary | ICD-10-CM | POA: Diagnosis not present

## 2016-06-16 DIAGNOSIS — D472 Monoclonal gammopathy: Secondary | ICD-10-CM | POA: Diagnosis not present

## 2016-06-16 DIAGNOSIS — D649 Anemia, unspecified: Secondary | ICD-10-CM | POA: Diagnosis not present

## 2016-06-16 DIAGNOSIS — I10 Essential (primary) hypertension: Secondary | ICD-10-CM | POA: Diagnosis not present

## 2016-06-16 DIAGNOSIS — E785 Hyperlipidemia, unspecified: Secondary | ICD-10-CM | POA: Diagnosis not present

## 2016-09-16 DIAGNOSIS — C61 Malignant neoplasm of prostate: Secondary | ICD-10-CM | POA: Diagnosis not present

## 2016-09-21 DIAGNOSIS — Z8546 Personal history of malignant neoplasm of prostate: Secondary | ICD-10-CM | POA: Diagnosis not present

## 2016-12-15 DIAGNOSIS — D649 Anemia, unspecified: Secondary | ICD-10-CM | POA: Diagnosis not present

## 2016-12-15 DIAGNOSIS — I10 Essential (primary) hypertension: Secondary | ICD-10-CM | POA: Diagnosis not present

## 2016-12-15 DIAGNOSIS — C61 Malignant neoplasm of prostate: Secondary | ICD-10-CM | POA: Diagnosis not present

## 2016-12-23 DIAGNOSIS — N400 Enlarged prostate without lower urinary tract symptoms: Secondary | ICD-10-CM | POA: Diagnosis not present

## 2016-12-23 DIAGNOSIS — Z0000 Encounter for general adult medical examination without abnormal findings: Secondary | ICD-10-CM | POA: Diagnosis not present

## 2016-12-23 DIAGNOSIS — E785 Hyperlipidemia, unspecified: Secondary | ICD-10-CM | POA: Diagnosis not present

## 2016-12-23 DIAGNOSIS — R42 Dizziness and giddiness: Secondary | ICD-10-CM | POA: Diagnosis not present

## 2016-12-23 DIAGNOSIS — I10 Essential (primary) hypertension: Secondary | ICD-10-CM | POA: Diagnosis not present

## 2017-01-01 DIAGNOSIS — E785 Hyperlipidemia, unspecified: Secondary | ICD-10-CM | POA: Diagnosis not present

## 2017-01-01 DIAGNOSIS — I10 Essential (primary) hypertension: Secondary | ICD-10-CM | POA: Diagnosis not present

## 2017-01-01 DIAGNOSIS — R42 Dizziness and giddiness: Secondary | ICD-10-CM | POA: Diagnosis not present

## 2017-01-01 DIAGNOSIS — D649 Anemia, unspecified: Secondary | ICD-10-CM | POA: Diagnosis not present

## 2017-01-04 ENCOUNTER — Other Ambulatory Visit: Payer: Self-pay | Admitting: Internal Medicine

## 2017-01-04 DIAGNOSIS — R42 Dizziness and giddiness: Secondary | ICD-10-CM

## 2017-01-09 ENCOUNTER — Ambulatory Visit
Admission: RE | Admit: 2017-01-09 | Discharge: 2017-01-09 | Disposition: A | Discharge status: Home / Self Care | Payer: Medicare Other | Source: Ambulatory Visit | Attending: Internal Medicine | Admitting: Internal Medicine

## 2017-01-09 DIAGNOSIS — R42 Dizziness and giddiness: Secondary | ICD-10-CM | POA: Diagnosis not present

## 2017-01-18 DIAGNOSIS — R42 Dizziness and giddiness: Secondary | ICD-10-CM | POA: Diagnosis not present

## 2017-01-18 DIAGNOSIS — I10 Essential (primary) hypertension: Secondary | ICD-10-CM | POA: Diagnosis not present

## 2017-01-29 DIAGNOSIS — E785 Hyperlipidemia, unspecified: Secondary | ICD-10-CM | POA: Diagnosis not present

## 2017-01-29 DIAGNOSIS — N400 Enlarged prostate without lower urinary tract symptoms: Secondary | ICD-10-CM | POA: Diagnosis not present

## 2017-01-29 DIAGNOSIS — I10 Essential (primary) hypertension: Secondary | ICD-10-CM | POA: Diagnosis not present

## 2017-03-02 DIAGNOSIS — R42 Dizziness and giddiness: Secondary | ICD-10-CM | POA: Diagnosis not present

## 2017-03-27 ENCOUNTER — Ambulatory Visit: Payer: Self-pay | Admitting: Physician Assistant

## 2017-03-27 DIAGNOSIS — J069 Acute upper respiratory infection, unspecified: Secondary | ICD-10-CM | POA: Diagnosis not present

## 2017-08-12 DIAGNOSIS — I10 Essential (primary) hypertension: Secondary | ICD-10-CM | POA: Diagnosis not present

## 2017-08-12 DIAGNOSIS — E785 Hyperlipidemia, unspecified: Secondary | ICD-10-CM | POA: Diagnosis not present

## 2017-08-12 DIAGNOSIS — N400 Enlarged prostate without lower urinary tract symptoms: Secondary | ICD-10-CM | POA: Diagnosis not present

## 2017-08-17 DIAGNOSIS — H52203 Unspecified astigmatism, bilateral: Secondary | ICD-10-CM | POA: Diagnosis not present

## 2017-08-17 DIAGNOSIS — H5212 Myopia, left eye: Secondary | ICD-10-CM | POA: Diagnosis not present

## 2017-08-17 DIAGNOSIS — H2512 Age-related nuclear cataract, left eye: Secondary | ICD-10-CM | POA: Diagnosis not present

## 2017-08-19 DIAGNOSIS — Z0000 Encounter for general adult medical examination without abnormal findings: Secondary | ICD-10-CM | POA: Diagnosis not present

## 2017-08-19 DIAGNOSIS — I10 Essential (primary) hypertension: Secondary | ICD-10-CM | POA: Diagnosis not present

## 2017-08-19 DIAGNOSIS — Z23 Encounter for immunization: Secondary | ICD-10-CM | POA: Diagnosis not present

## 2017-08-19 DIAGNOSIS — E785 Hyperlipidemia, unspecified: Secondary | ICD-10-CM | POA: Diagnosis not present

## 2017-08-19 DIAGNOSIS — D649 Anemia, unspecified: Secondary | ICD-10-CM | POA: Diagnosis not present

## 2017-08-19 DIAGNOSIS — M65312 Trigger thumb, left thumb: Secondary | ICD-10-CM | POA: Diagnosis not present

## 2017-08-23 DIAGNOSIS — M79645 Pain in left finger(s): Secondary | ICD-10-CM | POA: Diagnosis not present

## 2017-08-23 DIAGNOSIS — M6530 Trigger finger, unspecified finger: Secondary | ICD-10-CM | POA: Diagnosis not present

## 2018-01-13 DIAGNOSIS — R05 Cough: Secondary | ICD-10-CM | POA: Diagnosis not present

## 2018-01-13 DIAGNOSIS — J00 Acute nasopharyngitis [common cold]: Secondary | ICD-10-CM | POA: Diagnosis not present

## 2018-01-13 DIAGNOSIS — J029 Acute pharyngitis, unspecified: Secondary | ICD-10-CM | POA: Diagnosis not present

## 2018-01-13 DIAGNOSIS — R062 Wheezing: Secondary | ICD-10-CM | POA: Diagnosis not present

## 2018-01-13 DIAGNOSIS — J3489 Other specified disorders of nose and nasal sinuses: Secondary | ICD-10-CM | POA: Diagnosis not present

## 2018-01-13 DIAGNOSIS — I10 Essential (primary) hypertension: Secondary | ICD-10-CM | POA: Diagnosis not present

## 2018-01-13 DIAGNOSIS — E785 Hyperlipidemia, unspecified: Secondary | ICD-10-CM | POA: Diagnosis not present

## 2018-01-20 DIAGNOSIS — I10 Essential (primary) hypertension: Secondary | ICD-10-CM | POA: Diagnosis not present

## 2018-01-20 DIAGNOSIS — E785 Hyperlipidemia, unspecified: Secondary | ICD-10-CM | POA: Diagnosis not present

## 2018-01-20 DIAGNOSIS — J029 Acute pharyngitis, unspecified: Secondary | ICD-10-CM | POA: Diagnosis not present

## 2018-02-10 DIAGNOSIS — D509 Iron deficiency anemia, unspecified: Secondary | ICD-10-CM | POA: Diagnosis not present

## 2018-02-10 DIAGNOSIS — I10 Essential (primary) hypertension: Secondary | ICD-10-CM | POA: Diagnosis not present

## 2018-02-10 DIAGNOSIS — D472 Monoclonal gammopathy: Secondary | ICD-10-CM | POA: Diagnosis not present

## 2018-02-10 DIAGNOSIS — D638 Anemia in other chronic diseases classified elsewhere: Secondary | ICD-10-CM | POA: Diagnosis not present

## 2018-02-10 DIAGNOSIS — D649 Anemia, unspecified: Secondary | ICD-10-CM | POA: Diagnosis not present

## 2018-02-10 DIAGNOSIS — E785 Hyperlipidemia, unspecified: Secondary | ICD-10-CM | POA: Diagnosis not present

## 2018-02-17 DIAGNOSIS — I10 Essential (primary) hypertension: Secondary | ICD-10-CM | POA: Diagnosis not present

## 2018-02-17 DIAGNOSIS — D472 Monoclonal gammopathy: Secondary | ICD-10-CM | POA: Diagnosis not present

## 2018-02-17 DIAGNOSIS — D649 Anemia, unspecified: Secondary | ICD-10-CM | POA: Diagnosis not present

## 2018-02-17 DIAGNOSIS — E785 Hyperlipidemia, unspecified: Secondary | ICD-10-CM | POA: Diagnosis not present

## 2018-02-28 DIAGNOSIS — Z9181 History of falling: Secondary | ICD-10-CM | POA: Diagnosis not present

## 2018-02-28 DIAGNOSIS — M1712 Unilateral primary osteoarthritis, left knee: Secondary | ICD-10-CM | POA: Diagnosis not present

## 2018-04-14 DIAGNOSIS — J019 Acute sinusitis, unspecified: Secondary | ICD-10-CM | POA: Diagnosis not present

## 2018-08-02 DIAGNOSIS — J019 Acute sinusitis, unspecified: Secondary | ICD-10-CM | POA: Diagnosis not present

## 2018-08-15 DIAGNOSIS — D649 Anemia, unspecified: Secondary | ICD-10-CM | POA: Diagnosis not present

## 2018-08-15 DIAGNOSIS — E785 Hyperlipidemia, unspecified: Secondary | ICD-10-CM | POA: Diagnosis not present

## 2018-08-15 DIAGNOSIS — N400 Enlarged prostate without lower urinary tract symptoms: Secondary | ICD-10-CM | POA: Diagnosis not present

## 2018-08-15 DIAGNOSIS — I10 Essential (primary) hypertension: Secondary | ICD-10-CM | POA: Diagnosis not present

## 2018-08-22 DIAGNOSIS — M25562 Pain in left knee: Secondary | ICD-10-CM | POA: Diagnosis not present

## 2018-08-22 DIAGNOSIS — G8929 Other chronic pain: Secondary | ICD-10-CM | POA: Diagnosis not present

## 2018-08-22 DIAGNOSIS — E785 Hyperlipidemia, unspecified: Secondary | ICD-10-CM | POA: Diagnosis not present

## 2018-08-22 DIAGNOSIS — Z0000 Encounter for general adult medical examination without abnormal findings: Secondary | ICD-10-CM | POA: Diagnosis not present

## 2018-08-22 DIAGNOSIS — Z125 Encounter for screening for malignant neoplasm of prostate: Secondary | ICD-10-CM | POA: Diagnosis not present

## 2018-08-22 DIAGNOSIS — Z23 Encounter for immunization: Secondary | ICD-10-CM | POA: Diagnosis not present

## 2018-08-22 DIAGNOSIS — D649 Anemia, unspecified: Secondary | ICD-10-CM | POA: Diagnosis not present

## 2018-08-22 DIAGNOSIS — I10 Essential (primary) hypertension: Secondary | ICD-10-CM | POA: Diagnosis not present

## 2018-08-24 DIAGNOSIS — M25562 Pain in left knee: Secondary | ICD-10-CM | POA: Diagnosis not present

## 2018-08-24 DIAGNOSIS — M25569 Pain in unspecified knee: Secondary | ICD-10-CM | POA: Diagnosis not present

## 2018-08-24 DIAGNOSIS — M1712 Unilateral primary osteoarthritis, left knee: Secondary | ICD-10-CM | POA: Diagnosis not present

## 2018-09-02 DIAGNOSIS — H5212 Myopia, left eye: Secondary | ICD-10-CM | POA: Diagnosis not present

## 2018-09-02 DIAGNOSIS — H25812 Combined forms of age-related cataract, left eye: Secondary | ICD-10-CM | POA: Diagnosis not present

## 2019-02-13 DIAGNOSIS — I10 Essential (primary) hypertension: Secondary | ICD-10-CM | POA: Diagnosis not present

## 2019-02-13 DIAGNOSIS — C61 Malignant neoplasm of prostate: Secondary | ICD-10-CM | POA: Diagnosis not present

## 2019-02-13 DIAGNOSIS — R739 Hyperglycemia, unspecified: Secondary | ICD-10-CM | POA: Diagnosis not present

## 2019-02-13 DIAGNOSIS — D649 Anemia, unspecified: Secondary | ICD-10-CM | POA: Diagnosis not present

## 2019-02-13 DIAGNOSIS — E785 Hyperlipidemia, unspecified: Secondary | ICD-10-CM | POA: Diagnosis not present

## 2019-02-20 DIAGNOSIS — E785 Hyperlipidemia, unspecified: Secondary | ICD-10-CM | POA: Diagnosis not present

## 2019-02-20 DIAGNOSIS — R05 Cough: Secondary | ICD-10-CM | POA: Diagnosis not present

## 2019-02-20 DIAGNOSIS — D649 Anemia, unspecified: Secondary | ICD-10-CM | POA: Diagnosis not present

## 2019-02-20 DIAGNOSIS — I10 Essential (primary) hypertension: Secondary | ICD-10-CM | POA: Diagnosis not present

## 2019-02-20 DIAGNOSIS — R739 Hyperglycemia, unspecified: Secondary | ICD-10-CM | POA: Diagnosis not present

## 2019-02-20 DIAGNOSIS — Z125 Encounter for screening for malignant neoplasm of prostate: Secondary | ICD-10-CM | POA: Diagnosis not present

## 2019-02-22 DIAGNOSIS — M1712 Unilateral primary osteoarthritis, left knee: Secondary | ICD-10-CM | POA: Diagnosis not present

## 2019-02-22 DIAGNOSIS — M25562 Pain in left knee: Secondary | ICD-10-CM | POA: Diagnosis not present

## 2019-06-29 ENCOUNTER — Other Ambulatory Visit: Payer: Self-pay

## 2019-08-21 DIAGNOSIS — R7309 Other abnormal glucose: Secondary | ICD-10-CM | POA: Diagnosis not present

## 2019-08-21 DIAGNOSIS — E785 Hyperlipidemia, unspecified: Secondary | ICD-10-CM | POA: Diagnosis not present

## 2019-08-21 DIAGNOSIS — I10 Essential (primary) hypertension: Secondary | ICD-10-CM | POA: Diagnosis not present

## 2019-08-21 DIAGNOSIS — D472 Monoclonal gammopathy: Secondary | ICD-10-CM | POA: Diagnosis not present

## 2019-08-21 DIAGNOSIS — D649 Anemia, unspecified: Secondary | ICD-10-CM | POA: Diagnosis not present

## 2019-08-21 DIAGNOSIS — D509 Iron deficiency anemia, unspecified: Secondary | ICD-10-CM | POA: Diagnosis not present

## 2019-08-24 DIAGNOSIS — Z23 Encounter for immunization: Secondary | ICD-10-CM | POA: Diagnosis not present

## 2019-08-24 DIAGNOSIS — I10 Essential (primary) hypertension: Secondary | ICD-10-CM | POA: Diagnosis not present

## 2019-08-24 DIAGNOSIS — Z0000 Encounter for general adult medical examination without abnormal findings: Secondary | ICD-10-CM | POA: Diagnosis not present

## 2019-08-24 DIAGNOSIS — R05 Cough: Secondary | ICD-10-CM | POA: Diagnosis not present

## 2019-08-24 DIAGNOSIS — D649 Anemia, unspecified: Secondary | ICD-10-CM | POA: Diagnosis not present

## 2019-08-24 DIAGNOSIS — E785 Hyperlipidemia, unspecified: Secondary | ICD-10-CM | POA: Diagnosis not present

## 2019-08-24 DIAGNOSIS — R739 Hyperglycemia, unspecified: Secondary | ICD-10-CM | POA: Diagnosis not present

## 2019-08-24 DIAGNOSIS — D472 Monoclonal gammopathy: Secondary | ICD-10-CM | POA: Diagnosis not present

## 2019-09-06 DIAGNOSIS — Z970 Presence of artificial eye: Secondary | ICD-10-CM | POA: Diagnosis not present

## 2019-09-06 DIAGNOSIS — H2512 Age-related nuclear cataract, left eye: Secondary | ICD-10-CM | POA: Diagnosis not present

## 2019-12-01 DIAGNOSIS — C439 Malignant melanoma of skin, unspecified: Secondary | ICD-10-CM

## 2019-12-01 HISTORY — DX: Malignant melanoma of skin, unspecified: C43.9

## 2020-02-15 DIAGNOSIS — E785 Hyperlipidemia, unspecified: Secondary | ICD-10-CM | POA: Diagnosis not present

## 2020-02-15 DIAGNOSIS — Z125 Encounter for screening for malignant neoplasm of prostate: Secondary | ICD-10-CM | POA: Diagnosis not present

## 2020-02-15 DIAGNOSIS — R42 Dizziness and giddiness: Secondary | ICD-10-CM | POA: Diagnosis not present

## 2020-02-15 DIAGNOSIS — D649 Anemia, unspecified: Secondary | ICD-10-CM | POA: Diagnosis not present

## 2020-02-15 DIAGNOSIS — R739 Hyperglycemia, unspecified: Secondary | ICD-10-CM | POA: Diagnosis not present

## 2020-02-15 DIAGNOSIS — C61 Malignant neoplasm of prostate: Secondary | ICD-10-CM | POA: Diagnosis not present

## 2020-02-15 DIAGNOSIS — D472 Monoclonal gammopathy: Secondary | ICD-10-CM | POA: Diagnosis not present

## 2020-02-15 DIAGNOSIS — Z79899 Other long term (current) drug therapy: Secondary | ICD-10-CM | POA: Diagnosis not present

## 2020-02-28 DIAGNOSIS — R739 Hyperglycemia, unspecified: Secondary | ICD-10-CM | POA: Diagnosis not present

## 2020-02-28 DIAGNOSIS — D509 Iron deficiency anemia, unspecified: Secondary | ICD-10-CM | POA: Diagnosis not present

## 2020-02-28 DIAGNOSIS — D472 Monoclonal gammopathy: Secondary | ICD-10-CM | POA: Diagnosis not present

## 2020-02-28 DIAGNOSIS — N400 Enlarged prostate without lower urinary tract symptoms: Secondary | ICD-10-CM | POA: Diagnosis not present

## 2020-02-28 DIAGNOSIS — E785 Hyperlipidemia, unspecified: Secondary | ICD-10-CM | POA: Diagnosis not present

## 2020-02-28 DIAGNOSIS — C61 Malignant neoplasm of prostate: Secondary | ICD-10-CM | POA: Diagnosis not present

## 2020-02-28 DIAGNOSIS — I10 Essential (primary) hypertension: Secondary | ICD-10-CM | POA: Diagnosis not present

## 2020-02-29 ENCOUNTER — Encounter (HOSPITAL_COMMUNITY): Payer: Self-pay

## 2020-02-29 ENCOUNTER — Other Ambulatory Visit: Payer: Self-pay

## 2020-02-29 ENCOUNTER — Ambulatory Visit (INDEPENDENT_AMBULATORY_CARE_PROVIDER_SITE_OTHER)
Admission: EM | Admit: 2020-02-29 | Discharge: 2020-02-29 | Disposition: A | Discharge status: Home / Self Care | Payer: Medicare Other | Source: Home / Self Care

## 2020-02-29 DIAGNOSIS — R109 Unspecified abdominal pain: Secondary | ICD-10-CM | POA: Diagnosis not present

## 2020-02-29 DIAGNOSIS — C801 Malignant (primary) neoplasm, unspecified: Secondary | ICD-10-CM | POA: Diagnosis not present

## 2020-02-29 DIAGNOSIS — S39012A Strain of muscle, fascia and tendon of lower back, initial encounter: Secondary | ICD-10-CM | POA: Insufficient documentation

## 2020-02-29 DIAGNOSIS — Z20822 Contact with and (suspected) exposure to covid-19: Secondary | ICD-10-CM | POA: Diagnosis not present

## 2020-02-29 DIAGNOSIS — C9000 Multiple myeloma not having achieved remission: Secondary | ICD-10-CM | POA: Diagnosis not present

## 2020-02-29 DIAGNOSIS — K5900 Constipation, unspecified: Secondary | ICD-10-CM | POA: Insufficient documentation

## 2020-02-29 DIAGNOSIS — Z03818 Encounter for observation for suspected exposure to other biological agents ruled out: Secondary | ICD-10-CM | POA: Diagnosis not present

## 2020-02-29 DIAGNOSIS — M545 Low back pain, unspecified: Secondary | ICD-10-CM

## 2020-02-29 DIAGNOSIS — N400 Enlarged prostate without lower urinary tract symptoms: Secondary | ICD-10-CM

## 2020-02-29 DIAGNOSIS — N179 Acute kidney failure, unspecified: Secondary | ICD-10-CM | POA: Diagnosis not present

## 2020-02-29 DIAGNOSIS — R739 Hyperglycemia, unspecified: Secondary | ICD-10-CM | POA: Diagnosis not present

## 2020-02-29 DIAGNOSIS — E871 Hypo-osmolality and hyponatremia: Secondary | ICD-10-CM | POA: Diagnosis not present

## 2020-02-29 DIAGNOSIS — Z66 Do not resuscitate: Secondary | ICD-10-CM | POA: Diagnosis not present

## 2020-02-29 DIAGNOSIS — N390 Urinary tract infection, site not specified: Secondary | ICD-10-CM | POA: Diagnosis not present

## 2020-02-29 HISTORY — DX: Benign prostatic hyperplasia without lower urinary tract symptoms: N40.0

## 2020-02-29 HISTORY — DX: Essential (primary) hypertension: I10

## 2020-02-29 LAB — POCT URINALYSIS DIP (DEVICE)
Bilirubin Urine: NEGATIVE
Glucose, UA: NEGATIVE mg/dL
Ketones, ur: NEGATIVE mg/dL
Nitrite: NEGATIVE
Protein, ur: 100 mg/dL — AB
Specific Gravity, Urine: 1.025 (ref 1.005–1.030)
Urobilinogen, UA: 0.2 mg/dL (ref 0.0–1.0)
pH: 6.5 (ref 5.0–8.0)

## 2020-02-29 MED ORDER — PREDNISONE 20 MG PO TABS: 20.0000 mg | ORAL_TABLET | Freq: Every day | ORAL | 0 refills | Status: DC

## 2020-02-29 MED ORDER — ACETAMINOPHEN 325 MG PO TABS
650.0000 mg | ORAL_TABLET | Freq: Once | ORAL | Status: AC
  Administered 2020-02-29: 650 mg via ORAL

## 2020-02-29 MED ORDER — ACETAMINOPHEN 325 MG PO TABS
ORAL_TABLET | ORAL | Status: AC
  Filled 2020-02-29: qty 2

## 2020-02-29 MED ORDER — METHOCARBAMOL 500 MG PO TABS: 500.0000 mg | ORAL_TABLET | Freq: Two times a day (BID) | ORAL | 0 refills | Status: DC

## 2020-02-29 NOTE — ED Triage Notes (Signed)
Pt c/o 9/10 throbbing pain in lower backx 1wk. Pt states he lifted heavy furniture a wk ago and has been having the back pain since. Pt denies numbness and tingling. Pt states he has been having some "urine dribble since the lower back pain, but no Bm. Pt c/o dysuria.

## 2020-02-29 NOTE — ED Provider Notes (Signed)
[place]   MRN: [id number] DOB: 1929-02-12  Subjective:   Joe [name] is a 86 y.o. male presenting for 1 week hx of persistent low back pain.  Symptoms started after he moved some heavy furniture.  He did not have sx immediately, did not have any fall or trauma.  He has also been having some urinary dribble.  Has a history of an enlarged prostate, hypertension.  Patient was previously taking doxazosin and finasteride but is no longer on these medications and states that he was dismissed from his urology practice.  Has not had follow-up in a while.  Reports that he has had some warm sensation when he urinates.  Denies history of kidney stones.  Denies dysuria, urinary frequency.  Denies weakened flow.  Denies history of constipation but unfortunately in the past week has had more difficulty.  Last bowel movement was about 3 days ago.  No current facility-administered medications for this encounter.  Current Outpatient Medications:  .  doxazosin (CARDURA) 4 MG tablet, Take 4 mg by mouth at bedtime., Disp: , Rfl:  .  finasteride (PROSCAR) 5 MG tablet, Take 5 mg by mouth daily., Disp: , Rfl:  .  fosinopril (MONOPRIL) 40 MG tablet, Take 40 mg by mouth daily., Disp: , Rfl:  .  indapamide (LOZOL) 2.5 MG tablet, Take 2.5 mg by mouth every other day. , Disp: , Rfl:    No Known Allergies  Past Medical History:  Diagnosis Date  . Enlarged prostate   . Hypertension      Past Surgical History:  Procedure Laterality Date  . EYE SURGERY    . PROSTATE SURGERY      Family History  Problem Relation Age of Onset  . Hypertension Mother     Social History   Tobacco Use  . Smoking status: Never Smoker  . Smokeless tobacco: Never Used  Substance Use Topics  . Alcohol use: Never  . Drug use: Never    ROS   Objective:   Vitals: BP (!) 167/73   Pulse 74   Temp 98.2 F (36.8 C) (Oral)   Resp 16   Ht 5\' 11"  (1.803 m)   Wt 205 lb (93 kg)   SpO2 95%   BMI 28.59 kg/m    Physical Exam Constitutional:      General: He is not in acute distress.    Appearance: Normal appearance. He is well-developed. He is not ill-appearing, toxic-appearing or diaphoretic.  HENT:     Head: Normocephalic and atraumatic.     Right Ear: External ear normal.     Left Ear: External ear normal.     Nose: Nose normal.     Mouth/Throat:     Mouth: Mucous membranes are moist.     Pharynx: Oropharynx is clear.  Eyes:     General: No scleral icterus.    Extraocular Movements: Extraocular movements intact.     Pupils: Pupils are equal, round, and reactive to light.  Cardiovascular:     Rate and Rhythm: Normal rate and regular rhythm.     Heart sounds: Normal heart sounds. No murmur. No friction rub. No gallop.   Pulmonary:     Effort: Pulmonary effort is normal. No respiratory distress.     Breath sounds: Normal breath sounds. No stridor. No wheezing, rhonchi or rales.  Abdominal:     General: Bowel sounds are normal. There is no distension.     Palpations: Abdomen is soft. There is no mass.  Tenderness: There is no abdominal tenderness. There is no right CVA tenderness, left CVA tenderness, guarding or rebound.  Musculoskeletal:     Lumbar back: Tenderness (has movement pain) present. No swelling, edema, deformity, signs of trauma, lacerations, spasms or bony tenderness. Normal range of motion. Negative right straight leg raise test and negative left straight leg raise test. No scoliosis.  Skin:    General: Skin is warm and dry.  Neurological:     Mental Status: He is alert and oriented to person, place, and time.  Psychiatric:        Mood and Affect: Mood normal.        Behavior: Behavior normal.        Thought Content: Thought content normal.        Judgment: Judgment normal.     Results for orders placed or performed during the hospital encounter of 02/29/20 (from the past 24 hour(s))  POCT urinalysis dip (device)     Status: Abnormal   Collection Time: 02/29/20   3:37 PM  Result Value Ref Range   Glucose, UA NEGATIVE NEGATIVE mg/dL   Bilirubin Urine NEGATIVE NEGATIVE   Ketones, ur NEGATIVE NEGATIVE mg/dL   Specific Gravity, Urine 1.025 1.005 - 1.030   Hgb urine dipstick MODERATE (A) NEGATIVE   pH 6.5 5.0 - 8.0   Protein, ur 100 (A) NEGATIVE mg/dL   Urobilinogen, UA 0.2 0.0 - 1.0 mg/dL   Nitrite NEGATIVE NEGATIVE   Leukocytes,Ua TRACE (A) NEGATIVE    Assessment and Plan :   1. Strain of lumbar region, initial encounter   2. Acute bilateral low back pain without sciatica   3. Enlarged prostate   4. Constipation, unspecified constipation type     Start prednisone once daily for 7 days to help with anti-inflammatory properties for back strain.  Use Robaxin as a light muscle relaxant.  Deferred imaging given reassuring physical exam findings.  Patient has new onset constipation, history of an enlarged prostate and is not being followed by a urologist now.  He was previously taking medications for this including doxazosin, finasteride but was taken off of these and has not gone back for follow-up.  Counseled that he will likely need imaging.  We will hold off on restarting these medicines until he could be evaluated by the urologist. Counseled patient on potential for adverse effects with medications prescribed/recommended today, ER and return-to-clinic precautions discussed, patient verbalized understanding.    Jaynee Eagles, [place] 02/29/20 1621

## 2020-02-29 NOTE — Discharge Instructions (Addendum)
Do not use any nonsteroidal anti-inflammatories (NSAIDs) like ibuprofen, Motrin, naproxen, Aleve, etc. which are all available over-the-counter.  Please just use Tylenol at a dose of 500mg -650mg  once every 6 hours as needed for your aches, pains.  Please make sure that you follow-up with an urologist, I can recommend Dr. Ona Meckel.  However, I would recommend getting in with the first urologist available as you need an evaluation for your prostate with imaging either through an ultrasound or CT scan of your pelvis.  Otherwise, I will call and let you know if your urine culture shows [place] that you have a urinary tract infection.

## 2020-03-01 ENCOUNTER — Emergency Department (HOSPITAL_COMMUNITY): Payer: Medicare Other

## 2020-03-01 ENCOUNTER — Inpatient Hospital Stay (HOSPITAL_COMMUNITY)
Admission: EM | Admit: 2020-03-01 | Discharge: 2020-03-03 | DRG: 841 | Disposition: A | Discharge status: Home / Self Care | Payer: Medicare Other | Attending: Family Medicine | Admitting: Family Medicine

## 2020-03-01 ENCOUNTER — Inpatient Hospital Stay (HOSPITAL_COMMUNITY): Payer: Medicare Other

## 2020-03-01 ENCOUNTER — Other Ambulatory Visit: Payer: Self-pay

## 2020-03-01 ENCOUNTER — Encounter (HOSPITAL_COMMUNITY): Payer: Self-pay

## 2020-03-01 ENCOUNTER — Other Ambulatory Visit: Payer: Self-pay | Admitting: Oncology

## 2020-03-01 DIAGNOSIS — C9000 Multiple myeloma not having achieved remission: Secondary | ICD-10-CM | POA: Diagnosis present

## 2020-03-01 DIAGNOSIS — Z79899 Other long term (current) drug therapy: Secondary | ICD-10-CM | POA: Diagnosis not present

## 2020-03-01 DIAGNOSIS — I10 Essential (primary) hypertension: Secondary | ICD-10-CM | POA: Diagnosis present

## 2020-03-01 DIAGNOSIS — N179 Acute kidney failure, unspecified: Secondary | ICD-10-CM | POA: Diagnosis present

## 2020-03-01 DIAGNOSIS — E785 Hyperlipidemia, unspecified: Secondary | ICD-10-CM | POA: Diagnosis present

## 2020-03-01 DIAGNOSIS — R109 Unspecified abdominal pain: Secondary | ICD-10-CM | POA: Diagnosis not present

## 2020-03-01 DIAGNOSIS — R739 Hyperglycemia, unspecified: Secondary | ICD-10-CM | POA: Diagnosis not present

## 2020-03-01 DIAGNOSIS — K5903 Drug induced constipation: Secondary | ICD-10-CM | POA: Diagnosis present

## 2020-03-01 DIAGNOSIS — D638 Anemia in other chronic diseases classified elsewhere: Secondary | ICD-10-CM | POA: Diagnosis present

## 2020-03-01 DIAGNOSIS — N390 Urinary tract infection, site not specified: Secondary | ICD-10-CM | POA: Diagnosis present

## 2020-03-01 DIAGNOSIS — Z8249 Family history of ischemic heart disease and other diseases of the circulatory system: Secondary | ICD-10-CM

## 2020-03-01 DIAGNOSIS — E871 Hypo-osmolality and hyponatremia: Secondary | ICD-10-CM | POA: Diagnosis present

## 2020-03-01 DIAGNOSIS — T40605A Adverse effect of unspecified narcotics, initial encounter: Secondary | ICD-10-CM | POA: Diagnosis present

## 2020-03-01 DIAGNOSIS — C801 Malignant (primary) neoplasm, unspecified: Secondary | ICD-10-CM | POA: Diagnosis not present

## 2020-03-01 DIAGNOSIS — Z66 Do not resuscitate: Secondary | ICD-10-CM | POA: Diagnosis present

## 2020-03-01 DIAGNOSIS — C799 Secondary malignant neoplasm of unspecified site: Secondary | ICD-10-CM | POA: Diagnosis not present

## 2020-03-01 DIAGNOSIS — N400 Enlarged prostate without lower urinary tract symptoms: Secondary | ICD-10-CM | POA: Diagnosis present

## 2020-03-01 DIAGNOSIS — K5900 Constipation, unspecified: Secondary | ICD-10-CM | POA: Diagnosis present

## 2020-03-01 DIAGNOSIS — Y929 Unspecified place or not applicable: Secondary | ICD-10-CM

## 2020-03-01 DIAGNOSIS — Z03818 Encounter for observation for suspected exposure to other biological agents ruled out: Secondary | ICD-10-CM | POA: Diagnosis not present

## 2020-03-01 DIAGNOSIS — Z7952 Long term (current) use of systemic steroids: Secondary | ICD-10-CM

## 2020-03-01 DIAGNOSIS — S39012A Strain of muscle, fascia and tendon of lower back, initial encounter: Secondary | ICD-10-CM | POA: Diagnosis present

## 2020-03-01 DIAGNOSIS — M5442 Lumbago with sciatica, left side: Secondary | ICD-10-CM

## 2020-03-01 DIAGNOSIS — E8809 Other disorders of plasma-protein metabolism, not elsewhere classified: Secondary | ICD-10-CM | POA: Diagnosis present

## 2020-03-01 DIAGNOSIS — D472 Monoclonal gammopathy: Secondary | ICD-10-CM

## 2020-03-01 DIAGNOSIS — M5441 Lumbago with sciatica, right side: Secondary | ICD-10-CM | POA: Diagnosis not present

## 2020-03-01 DIAGNOSIS — X500XXA Overexertion from strenuous movement or load, initial encounter: Secondary | ICD-10-CM

## 2020-03-01 DIAGNOSIS — D649 Anemia, unspecified: Secondary | ICD-10-CM | POA: Diagnosis present

## 2020-03-01 DIAGNOSIS — N281 Cyst of kidney, acquired: Secondary | ICD-10-CM | POA: Diagnosis not present

## 2020-03-01 DIAGNOSIS — Z20822 Contact with and (suspected) exposure to covid-19: Secondary | ICD-10-CM | POA: Diagnosis present

## 2020-03-01 DIAGNOSIS — M549 Dorsalgia, unspecified: Secondary | ICD-10-CM | POA: Diagnosis present

## 2020-03-01 DIAGNOSIS — R778 Other specified abnormalities of plasma proteins: Secondary | ICD-10-CM | POA: Diagnosis present

## 2020-03-01 LAB — CBC WITH DIFFERENTIAL/PLATELET
Abs Immature Granulocytes: 0.02 10*3/uL (ref 0.00–0.07)
Basophils Absolute: 0 10*3/uL (ref 0.0–0.1)
Basophils Relative: 0 %
Eosinophils Absolute: 0 10*3/uL (ref 0.0–0.5)
Eosinophils Relative: 0 %
HCT: 31.1 % — ABNORMAL LOW (ref 39.0–52.0)
Hemoglobin: 9.6 g/dL — ABNORMAL LOW (ref 13.0–17.0)
Immature Granulocytes: 0 %
Lymphocytes Relative: 14 %
Lymphs Abs: 0.9 10*3/uL (ref 0.7–4.0)
MCH: 25 pg — ABNORMAL LOW (ref 26.0–34.0)
MCHC: 30.9 g/dL (ref 30.0–36.0)
MCV: 81 fL (ref 80.0–100.0)
Monocytes Absolute: 0 10*3/uL — ABNORMAL LOW (ref 0.1–1.0)
Monocytes Relative: 1 %
Neutro Abs: 5.5 10*3/uL (ref 1.7–7.7)
Neutrophils Relative %: 85 %
Platelets: 178 10*3/uL (ref 150–400)
RBC: 3.84 MIL/uL — ABNORMAL LOW (ref 4.22–5.81)
RDW: 13 % (ref 11.5–15.5)
WBC: 6.4 10*3/uL (ref 4.0–10.5)
nRBC: 0 % (ref 0.0–0.2)

## 2020-03-01 LAB — COMPREHENSIVE METABOLIC PANEL
ALT: 17 U/L (ref 0–44)
AST: 24 U/L (ref 15–41)
Albumin: 2.8 g/dL — ABNORMAL LOW (ref 3.5–5.0)
Alkaline Phosphatase: 51 U/L (ref 38–126)
Anion gap: 9 (ref 5–15)
BUN: 26 mg/dL — ABNORMAL HIGH (ref 8–23)
CO2: 24 mmol/L (ref 22–32)
Calcium: 12.7 mg/dL — ABNORMAL HIGH (ref 8.9–10.3)
Chloride: 100 mmol/L (ref 98–111)
Creatinine, Ser: 1.84 mg/dL — ABNORMAL HIGH (ref 0.61–1.24)
GFR calc Af Amer: 37 mL/min — ABNORMAL LOW (ref >60)
GFR calc non Af Amer: 32 mL/min — ABNORMAL LOW (ref >60)
Glucose, Bld: 174 mg/dL — ABNORMAL HIGH (ref 70–99)
Potassium: 3.5 mmol/L (ref 3.5–5.1)
Sodium: 133 mmol/L — ABNORMAL LOW (ref 135–145)
Total Bilirubin: 0.7 mg/dL (ref 0.3–1.2)
Total Protein: 10.4 g/dL — ABNORMAL HIGH (ref 6.5–8.1)

## 2020-03-01 LAB — LIPASE, BLOOD: Lipase: 17 U/L (ref 11–51)

## 2020-03-01 LAB — FERRITIN: Ferritin: 375 ng/mL — ABNORMAL HIGH (ref 24–336)

## 2020-03-01 LAB — URINALYSIS, ROUTINE W REFLEX MICROSCOPIC
Bilirubin Urine: NEGATIVE
Glucose, UA: NEGATIVE mg/dL
Hgb urine dipstick: NEGATIVE
Ketones, ur: NEGATIVE mg/dL
Leukocytes,Ua: NEGATIVE
Nitrite: NEGATIVE
Protein, ur: 100 mg/dL — AB
Specific Gravity, Urine: 1.021 (ref 1.005–1.030)
pH: 5 (ref 5.0–8.0)

## 2020-03-01 LAB — IRON AND TIBC
Iron: 49 ug/dL (ref 45–182)
Saturation Ratios: 21 % (ref 17.9–39.5)
TIBC: 238 ug/dL — ABNORMAL LOW (ref 250–450)
UIBC: 189 ug/dL

## 2020-03-01 LAB — CBC
HCT: 28.9 % — ABNORMAL LOW (ref 39.0–52.0)
Hemoglobin: 8.7 g/dL — ABNORMAL LOW (ref 13.0–17.0)
MCH: 24.8 pg — ABNORMAL LOW (ref 26.0–34.0)
MCHC: 30.1 g/dL (ref 30.0–36.0)
MCV: 82.3 fL (ref 80.0–100.0)
Platelets: 170 10*3/uL (ref 150–400)
RBC: 3.51 MIL/uL — ABNORMAL LOW (ref 4.22–5.81)
RDW: 13.1 % (ref 11.5–15.5)
WBC: 7.2 10*3/uL (ref 4.0–10.5)
nRBC: 0 % (ref 0.0–0.2)

## 2020-03-01 LAB — SEDIMENTATION RATE: Sed Rate: 98 mm/hr — ABNORMAL HIGH (ref 0–16)

## 2020-03-01 LAB — PHOSPHORUS: Phosphorus: 2.3 mg/dL — ABNORMAL LOW (ref 2.5–4.6)

## 2020-03-01 LAB — SARS CORONAVIRUS 2 (TAT 6-24 HRS): SARS Coronavirus 2: NEGATIVE

## 2020-03-01 LAB — CREATININE, SERUM
Creatinine, Ser: 1.55 mg/dL — ABNORMAL HIGH (ref 0.61–1.24)
GFR calc Af Amer: 46 mL/min — ABNORMAL LOW (ref >60)
GFR calc non Af Amer: 39 mL/min — ABNORMAL LOW (ref >60)

## 2020-03-01 LAB — MAGNESIUM: Magnesium: 1.6 mg/dL — ABNORMAL LOW (ref 1.7–2.4)

## 2020-03-01 LAB — C-REACTIVE PROTEIN: CRP: 0.6 mg/dL (ref <1.0)

## 2020-03-01 LAB — VITAMIN B12: Vitamin B-12: 1457 pg/mL — ABNORMAL HIGH (ref 180–914)

## 2020-03-01 MED ORDER — AMLODIPINE BESYLATE 5 MG PO TABS
5.0000 mg | ORAL_TABLET | Freq: Every day | ORAL | Status: DC
  Administered 2020-03-01 – 2020-03-03 (×3): 5 mg via ORAL
  Filled 2020-03-01 (×3): qty 1

## 2020-03-01 MED ORDER — PRAVASTATIN SODIUM 40 MG PO TABS
40.0000 mg | ORAL_TABLET | Freq: Every day | ORAL | Status: DC
  Administered 2020-03-01 – 2020-03-03 (×3): 40 mg via ORAL
  Filled 2020-03-01 (×3): qty 1

## 2020-03-01 MED ORDER — FINASTERIDE 5 MG PO TABS
5.0000 mg | ORAL_TABLET | Freq: Every day | ORAL | Status: DC
  Administered 2020-03-02 – 2020-03-03 (×2): 5 mg via ORAL
  Filled 2020-03-01 (×3): qty 1

## 2020-03-01 MED ORDER — ONDANSETRON HCL 4 MG/2ML IJ SOLN: 4.0000 mg | Freq: Four times a day (QID) | INTRAMUSCULAR | Status: DC | PRN

## 2020-03-01 MED ORDER — ACETAMINOPHEN 325 MG PO TABS
650.0000 mg | ORAL_TABLET | Freq: Four times a day (QID) | ORAL | Status: DC | PRN
  Administered 2020-03-01 – 2020-03-02 (×3): 650 mg via ORAL
  Filled 2020-03-01 (×3): qty 2

## 2020-03-01 MED ORDER — POLYETHYLENE GLYCOL 3350 17 G PO PACK
17.0000 g | PACK | Freq: Every day | ORAL | Status: DC | PRN
  Administered 2020-03-02: 17 g via ORAL
  Filled 2020-03-01: qty 1

## 2020-03-01 MED ORDER — ONDANSETRON HCL 4 MG PO TABS: 4.0000 mg | ORAL_TABLET | Freq: Four times a day (QID) | ORAL | Status: DC | PRN

## 2020-03-01 MED ORDER — SODIUM CHLORIDE 0.9 % IV BOLUS
500.0000 mL | Freq: Once | INTRAVENOUS | Status: AC
  Administered 2020-03-01: 500 mL via INTRAVENOUS

## 2020-03-01 MED ORDER — SODIUM CHLORIDE 0.9 % IV SOLN
60.0000 mg | Freq: Once | INTRAVENOUS | Status: AC
  Administered 2020-03-01: 60 mg via INTRAVENOUS
  Filled 2020-03-01: qty 20

## 2020-03-01 MED ORDER — WHITE PETROLATUM EX OINT
TOPICAL_OINTMENT | CUTANEOUS | Status: AC
  Filled 2020-03-01: qty 28.35

## 2020-03-01 MED ORDER — ENOXAPARIN SODIUM 40 MG/0.4ML ~~LOC~~ SOLN
40.0000 mg | SUBCUTANEOUS | Status: DC
  Administered 2020-03-01: 40 mg via SUBCUTANEOUS
  Filled 2020-03-01: qty 0.4

## 2020-03-01 MED ORDER — ACETAMINOPHEN 650 MG RE SUPP: 650.0000 mg | Freq: Four times a day (QID) | RECTAL | Status: DC | PRN

## 2020-03-01 MED ORDER — HYDROMORPHONE HCL 1 MG/ML IJ SOLN: 0.5000 mg | INTRAMUSCULAR | Status: DC | PRN

## 2020-03-01 MED ORDER — DEXAMETHASONE SODIUM PHOSPHATE 10 MG/ML IJ SOLN
20.0000 mg | INTRAMUSCULAR | Status: AC
  Administered 2020-03-01 – 2020-03-02 (×2): 20 mg via INTRAVENOUS
  Filled 2020-03-01 (×2): qty 2

## 2020-03-01 MED ORDER — FENTANYL CITRATE (PF) 100 MCG/2ML IJ SOLN
50.0000 ug | Freq: Once | INTRAMUSCULAR | Status: AC
  Administered 2020-03-01: 50 ug via INTRAVENOUS
  Filled 2020-03-01: qty 2

## 2020-03-01 MED ORDER — SODIUM CHLORIDE 0.9 % IV SOLN: INTRAVENOUS | Status: DC

## 2020-03-01 MED ORDER — SODIUM CHLORIDE 0.9 % IV SOLN
1.0000 g | INTRAVENOUS | Status: DC
  Administered 2020-03-01 – 2020-03-02 (×2): 1 g via INTRAVENOUS
  Filled 2020-03-01 (×2): qty 10

## 2020-03-01 MED ORDER — DOXAZOSIN MESYLATE 4 MG PO TABS
4.0000 mg | ORAL_TABLET | Freq: Every day | ORAL | Status: DC
  Administered 2020-03-02 (×2): 4 mg via ORAL
  Filled 2020-03-01 (×4): qty 1

## 2020-03-01 NOTE — H&P (Signed)
History and Physical    Joe [name] DOB: Feb 17, 1929 DOA: 03/01/2020  PCP: Jani Gravel, MD  Patient coming from: Home  I have personally briefly reviewed patient's old medical records in [place]  Chief Complaint: Severe back pain since 1 week  HPI: Joe [name] is a 86 y.o. male with medical history significant of hypertension, hyperlipidemia, BPH presents to emergency department due to severe back pain.  Patient tells me that his pain started a week before while he was moving some furniture and he felt a pop at that time although his back did not start hurting immediately.  Patient tells me that his pain is progressive, no history of trauma or fall.  He denies radiation to his lower extremities, numbness tingling sensation, urinary or bowel incontinence.  He tells me that his pain aggravates with walking and relieved with rest.  He went to urgent care yesterday and was prescribed Robaxin and prednisone.  Reports dysuria and foul-smelling urine since 1 week.  Denies association with fever, chills, nausea, vomiting, hematuria, abdominal pain.  Reports constipation and has been taking laxatives which helps.  His last bowel movement was yesterday.  No history of hematemesis or melena.  Has history of MGUS-used to see oncology Dr. Julien Nordmann.  No history of headache, blurry vision, dizziness, lightheadedness, chest pain, shortness of breath, palpitation, leg swelling, weight loss, decreased appetite, generalized weakness or lethargy.  He lives with his wife at home.  No history of smoking, alcohol, recent drug use.  ED Course: Upon arrival to ED: Patient's vital signs stable.  He is afebrile with no leukocytosis, CMP shows sodium of 133, AKI, hypercalcemia of 12.7, elevated protein level of 10.4, lipase: WNL, H&H shows 9.6/31.1, CT abdomen/pelvis was obtained which was lactic lesions in bone concerning for multiple myeloma versus metastatic disease.  Patient received IV fluids in  ED.  [facility] oncologist consulted by EDP.  [facility] hospitalist consulted for admission for hypercalcemia and UTI   Review of Systems: As per HPI otherwise negative.    Past Medical History:  Diagnosis Date  . Enlarged prostate   . Hypertension     Past Surgical History:  Procedure Laterality Date  . EYE SURGERY    . PROSTATE SURGERY       reports that he has never smoked. He has never used smokeless tobacco. He reports that he does not drink alcohol or use drugs.  No Known Allergies  Family History  Problem Relation Age of Onset  . Hypertension Mother     Prior to Admission medications   Medication Sig Start Date End Date Taking? Authorizing Provider  acetaminophen (TYLENOL) 500 MG tablet Take 1,000 mg by mouth every 6 (six) hours as needed for mild pain.   Yes [provider]  amLODipine (NORVASC) 5 MG tablet Take 5 mg by mouth daily. 01/08/20  Yes [provider]  doxazosin (CARDURA) 4 MG tablet Take 4 mg by mouth at bedtime.   Yes [provider]  finasteride (PROSCAR) 5 MG tablet Take 5 mg by mouth daily.   Yes [provider]  fosinopril (MONOPRIL) 40 MG tablet Take 40 mg by mouth daily.   Yes [provider]  indapamide (LOZOL) 2.5 MG tablet Take 2.5 mg by mouth daily.    Yes [provider]  pravastatin (PRAVACHOL) 40 MG tablet Take 40 mg by mouth daily. 01/08/20  Yes [provider]  predniSONE (DELTASONE) 20 MG tablet Take 1 tablet (20 mg total) by mouth daily  with breakfast. 02/29/20  Yes Jaynee Eagles, PA-C  vitamin B-12 (CYANOCOBALAMIN) 1000 MCG tablet Take 1,000 mcg by mouth daily.   Yes [provider]  azithromycin (ZITHROMAX) 250 MG tablet Take 250-500 mg by mouth as directed. Take 2 tablets (555m) now, then 1 tablet (2574m once a day until gone. 02/19/20   [provider]  methocarbamol (ROBAXIN) 500 MG tablet Take 1 tablet (500 mg total) by mouth 2 (two) times daily. 02/29/20   MaJaynee Eagles PA-C    Physical Exam: Vitals:   03/01/20 1406 03/01/20 1500 03/01/20 1515 03/01/20 1530  BP: 134/71 133/67  138/70  Pulse: 79  82 82  Resp: 16   16  Temp:      TempSrc:      SpO2: 100%  96% 98%  Weight:      Height:        Constitutional: NAD, calm, comfortable, communicating well Eyes: PERRL, lids and conjunctivae normal ENMT: Mucous membranes are moist. Posterior pharynx clear of any exudate or lesions.Normal dentition.  Neck: normal, supple, no masses, no thyromegaly Respiratory: clear to auscultation bilaterally, no wheezing, no crackles. Normal respiratory effort. No accessory muscle use.  Cardiovascular: Regular rate and rhythm, no murmurs / rubs / gallops. No extremity edema. 2+ pedal pulses. No carotid bruits.  Abdomen: no tenderness, no masses palpated. No hepatosplenomegaly. Bowel sounds positive.  Musculoskeletal: no clubbing / cyanosis. No joint deformity upper and lower extremities. Good ROM, no contractures. Normal muscle tone.  Skin: no rashes, lesions, ulcers. No induration Neurologic: CN 2-12 grossly intact. Sensation intact, DTR normal. Strength 5/5 in all 4.  Psychiatric: Normal judgment and insight. Alert and oriented x 3. Normal mood.    Labs on Admission: I have personally reviewed following labs and imaging studies  CBC: Recent Labs  Lab 03/01/20 1052  WBC 6.4  NEUTROABS 5.5  HGB 9.6*  HCT 31.1*  MCV 81.0  PLT 17147 Basic Metabolic Panel: Recent Labs  Lab 03/01/20 1052  NA 133*  K 3.5  CL 100  CO2 24  GLUCOSE 174*  BUN 26*  CREATININE 1.84*  CALCIUM 12.7*   GFR: Estimated Creatinine Clearance: 32.3 mL/min (A) (by C-G formula based on SCr of 1.84 mg/dL (H)). Liver Function Tests: Recent Labs  Lab 03/01/20 1052  AST 24  ALT 17  ALKPHOS 51  BILITOT 0.7  PROT 10.4*  ALBUMIN 2.8*   Recent Labs  Lab 03/01/20 1052  LIPASE 17   No results for input(s): AMMONIA in the last 168 hours. Coagulation Profile: No results for  input(s): INR, PROTIME in the last 168 hours. Cardiac Enzymes: No results for input(s): CKTOTAL, CKMB, CKMBINDEX, TROPONINI in the last 168 hours. BNP (last 3 results) No results for input(s): PROBNP in the last 8760 hours. HbA1C: No results for input(s): HGBA1C in the last 72 hours. CBG: No results for input(s): GLUCAP in the last 168 hours. Lipid Profile: No results for input(s): CHOL, HDL, LDLCALC, TRIG, CHOLHDL, LDLDIRECT in the last 72 hours. Thyroid Function Tests: No results for input(s): TSH, T4TOTAL, FREET4, T3FREE, THYROIDAB in the last 72 hours. Anemia Panel: No results for input(s): VITAMINB12, FOLATE, FERRITIN, TIBC, IRON, RETICCTPCT in the last 72 hours. Urine analysis:    Component Value Date/Time   COLORURINE AMBER (A) 03/01/2020 1052   APPEARANCEUR CLOUDY (A) 03/01/2020 1052   LABSPEC 1.021 03/01/2020 1052   PHURINE 5.0 03/01/2020 1052   GLUCOSEU NEGATIVE 03/01/2020 1052   HGBUR NEGATIVE 03/01/2020 [place]  NEGATIVE 03/01/2020 1052   KETONESUR NEGATIVE 03/01/2020 1052   PROTEINUR 100 (A) 03/01/2020 1052   UROBILINOGEN 0.2 02/29/2020 1537   NITRITE NEGATIVE 03/01/2020 1052   LEUKOCYTESUR NEGATIVE 03/01/2020 1052    Radiological Exams on Admission: CT ABDOMEN PELVIS WO CONTRAST  Result Date: 03/01/2020 CLINICAL DATA:  Left abdominal pain, back pain x1 week EXAM: CT ABDOMEN AND PELVIS WITHOUT CONTRAST TECHNIQUE: Multidetector CT imaging of the abdomen and pelvis was performed following the standard protocol without IV contrast. COMPARISON:  12/28/2003 FINDINGS: Lower chest: Coronary calcifications. No pleural or pericardial effusion. Hepatobiliary: No focal liver abnormality is seen. No gallstones, gallbladder wall thickening, or biliary dilatation. Pancreas: Unremarkable. No pancreatic ductal dilatation or surrounding inflammatory changes. Spleen: Normal in size without focal abnormality. Adrenals/Urinary Tract: 4.3 cm left upper pole renal cyst. No  hydronephrosis. Adrenal glands unremarkable. Urinary bladder incompletely distended. Stomach/Bowel: Stomach is within normal limits. Appendix not identified. No evidence of bowel wall thickening, distention, or inflammatory changes. Vascular/Lymphatic: Minimal aortoiliac atherosclerosis (ICD10-170.0) without aneurysm. No abdominal or pelvic adenopathy. Reproductive: Multiple metallic seeds around the prostate. Other: No ascites.  No free air. Musculoskeletal: Innumerable small lytic lesions throughout all visualized bones. Mild T12 compression deformity, age indeterminate. IMPRESSION: 1. Innumerable lytic lesions involving all visualized bones suggesting metastatic disease or multiple myeloma. Electronically Signed   By: Lucrezia Europe M.D.   On: 03/01/2020 13:38     Assessment/Plan Principal Problem:   Back pain Active Problems:   MGUS (monoclonal gammopathy of unknown significance)   Hypertension   Hypercalcemia   AKI (acute kidney injury) ([place])   Normocytic anemia   HLD (hyperlipidemia)   BPH (benign prostatic hyperplasia)   Elevated total protein   Hyponatremia   Lower back pain: -Likely secondary to multiple myeloma.  Has history of MGUS.  Patient presented with severe back pain, AKI, normocytic anemia, hypercalcemia and hyperproteinemia.  Reviewed CT abdomen/pelvis result.   -Admit patient on the floor. -Start on IV fluids.  Check CRP, ESR, ionized calcium -EDP consulted oncology-recommend myeloma panel, 24-hour urine for protein, creatinine and light chains, kappa/lambda serum light chains, start on pamidronate 60 mg IV once, dexamethasone 20 mg once daily for 2 doses. -Consulted physical therapy.  Dilaudid as needed for pain control.  AKI: -Continue IV fluids.  Monitor kidney function closely.  Will get renal ultrasound.  Avoid nephrotoxic medication.  Hypercalcemia: -Continue IV fluids.  Monitor BMP  UTI: -Patient reports urinary symptoms such as dysuria and foul-smelling urine  since 1 week -He is afebrile with no leukocytosis.  UA positive for few bacteria.  Urine culture is pending. -Start on Rocephin IV daily.  Follow urine culture and de-escalate antibiotics.  Hypertension: Blood pressure is stable -Continue amlodipine, hold ACE and thiazide diuretic for now due to AKI -Monitor blood pressure closely.  Normocytic anemia: H&H: 9.6/31.1 -No history of melena. -Check iron studies, B12, folate  Hyperlipidemia: Continue statin  Hyponatremia: Sodium 133 -Continue IV fluids and monitor BMP  Constipation: Start on MiraLAX  BPH: Continue Proscar and doxazosin  DVT prophylaxis: Lovenox/SCD/TED Code Status: DNR-confirmed with the patient Family Communication: None present at bedside.  Plan of care discussed with patient in length and he verbalized understanding and agreed with it. Disposition Plan: To be determined Consults called: Oncology by EDP Admission status: Inpatient   Mckinley Jewel MD [facility] Pager [phone]  If 7PM-7AM, please contact night-coverage [email or web address] Password [facility]  03/01/2020, 3:56 PM

## 2020-03-01 NOTE — ED Provider Notes (Signed)
[name] Provider Note   CSN: [id number] Arrival date & time: 03/01/20  1015     History Chief Complaint  Patient presents with  . Back Pain    [name] is a 86 y.o. male.  Patient is a 86 year old male who presents with back pain.  He states it started about a week ago.  He says about 2 weeks ago he was moving some furniture and he may have felt a pop at that time although his back did not start hurting immediately.  It started hurting about a week ago but he attributes it to moving furniture.  It is in his mid low back.  He says it is in the midline and does not really radiate to the sides.  It hurts mostly when he is walking or when he tries to stand up.  He denies any radiation down his legs.  No numbness or weakness to his legs.  He has some prostate issues and occasionally has some dribble of his urine but says this has not changed from his baseline.  He denies any loss of control of his urine or bladder.  No fevers.  He does complain of about a week history of some associated abdominal pain.  It is mostly left-sided.  There is no nausea or vomiting.  No fevers.  He does report some constipation.  He was seen in urgent care yesterday for his back pain and given a prescription for prednisone and a muscle relaxer.  He states he is taken 1 dose but so far the pain has not really changed.        Past Medical History:  Diagnosis Date  . Enlarged prostate   . Hypertension     Patient Active Problem List   Diagnosis Date Noted  . Hypertension   . Hypercalcemia   . AKI (acute kidney injury) ([place])   . Normocytic anemia   . HLD (hyperlipidemia)   . BPH (benign prostatic hyperplasia)   . Elevated total protein   . Hyponatremia   . Back pain   . MGUS (monoclonal gammopathy of unknown significance) 07/14/2012    Past Surgical History:  Procedure Laterality Date  . EYE SURGERY    . PROSTATE SURGERY         Family History    Problem Relation Age of Onset  . Hypertension Mother     Social History   Tobacco Use  . Smoking status: Never Smoker  . Smokeless tobacco: Never Used  Substance Use Topics  . Alcohol use: Never  . Drug use: Never    Home Medications Prior to Admission medications   Medication Sig Start Date End Date Taking? Authorizing Provider  acetaminophen (TYLENOL) 500 MG tablet Take 1,000 mg by mouth every 6 (six) hours as needed for mild pain.   Yes [provider]  amLODipine (NORVASC) 5 MG tablet Take 5 mg by mouth daily. 01/08/20  Yes [provider]  doxazosin (CARDURA) 4 MG tablet Take 4 mg by mouth at bedtime.   Yes [provider]  finasteride (PROSCAR) 5 MG tablet Take 5 mg by mouth daily.   Yes [provider]  fosinopril (MONOPRIL) 40 MG tablet Take 40 mg by mouth daily.   Yes [provider]  indapamide (LOZOL) 2.5 MG tablet Take 2.5 mg by mouth daily.    Yes [provider]  pravastatin (PRAVACHOL) 40 MG tablet Take 40 mg by mouth daily. 01/08/20  Yes [provider]  predniSONE (DELTASONE) 20 MG tablet Take 1 tablet (20 mg total) by mouth daily with breakfast. 02/29/20  Yes Jaynee Eagles, PA-C  vitamin B-12 (CYANOCOBALAMIN) 1000 MCG tablet Take 1,000 mcg by mouth daily.   Yes [provider]  azithromycin (ZITHROMAX) 250 MG tablet Take 250-500 mg by mouth as directed. Take 2 tablets (581m) now, then 1 tablet (2576m once a day until gone. 02/19/20   [provider]  methocarbamol (ROBAXIN) 500 MG tablet Take 1 tablet (500 mg total) by mouth 2 (two) times daily. 02/29/20   MaJaynee EaglesPA-C    Allergies    Patient has no known allergies.  Review of Systems   Review of Systems  Constitutional: Negative for chills, diaphoresis, fatigue and fever.  HENT: Negative for congestion, rhinorrhea and sneezing.   Eyes: Negative.   Respiratory: Negative for cough, chest tightness and shortness of breath.    Cardiovascular: Negative for chest pain and leg swelling.  Gastrointestinal: Positive for abdominal pain. Negative for blood in stool, diarrhea, nausea and vomiting.  Genitourinary: Negative for difficulty urinating, flank pain, frequency and hematuria.  Musculoskeletal: Positive for back pain. Negative for arthralgias.  Skin: Negative for rash.  Neurological: Negative for dizziness, speech difficulty, weakness, numbness and headaches.    Physical Exam Updated Vital Signs BP 138/70   Pulse 82   Temp 98 F (36.7 C) (Oral)   Resp 16   Ht '5\' 11"'  (1.803 m)   Wt 93 kg   SpO2 98%   BMI 28.59 kg/m   Physical Exam Constitutional:      Appearance: He is well-developed.  HENT:     Head: Normocephalic and atraumatic.  Eyes:     Pupils: Pupils are equal, round, and reactive to light.  Cardiovascular:     Rate and Rhythm: Normal rate and regular rhythm.     Heart sounds: Normal heart sounds.  Pulmonary:     Effort: Pulmonary effort is normal. No respiratory distress.     Breath sounds: Normal breath sounds. No wheezing or rales.  Chest:     Chest wall: No tenderness.  Abdominal:     General: Bowel sounds are normal.     Palpations: Abdomen is soft.     Tenderness: There is abdominal tenderness (Mild tenderness to the left flank). There is no guarding or rebound.  Musculoskeletal:        General: Normal range of motion.     Cervical back: Normal range of motion and neck supple.     Comments: No reproducible tenderness on palpation of the spine or the lumbar region bilaterally, negative straight leg raise bilaterally, pedal pulses are intact  Lymphadenopathy:     Cervical: No cervical adenopathy.  Skin:    General: Skin is warm and dry.     Findings: No rash.  Neurological:     Mental Status: He is alert and oriented to person, place, and time.     Comments: Motor 5 out of 5 in all extremities, sensation grossly intact to light touch all extremities     ED Results /  Procedures / Treatments   Labs (all labs ordered are listed, but only abnormal results are displayed) Labs Reviewed  COMPREHENSIVE METABOLIC PANEL - Abnormal; Notable for the following components:      Result Value   Sodium 133 (*)    Glucose, Bld 174 (*)    BUN 26 (*)    Creatinine, Ser 1.84 (*)    Calcium 12.7 (*)  Total Protein 10.4 (*)    Albumin 2.8 (*)    GFR calc non Af Amer 32 (*)    GFR calc Af Amer 37 (*)    All other components within normal limits  CBC WITH DIFFERENTIAL/PLATELET - Abnormal; Notable for the following components:   RBC 3.84 (*)    Hemoglobin 9.6 (*)    HCT 31.1 (*)    MCH 25.0 (*)    Monocytes Absolute 0.0 (*)    All other components within normal limits  URINALYSIS, ROUTINE W REFLEX MICROSCOPIC - Abnormal; Notable for the following components:   Color, Urine AMBER (*)    APPearance CLOUDY (*)    Protein, ur 100 (*)    Bacteria, UA FEW (*)    All other components within normal limits  SARS CORONAVIRUS 2 (TAT 6-24 HRS)  URINE CULTURE  LIPASE, BLOOD  C-REACTIVE PROTEIN  SEDIMENTATION RATE  CALCIUM, IONIZED  CBC  CREATININE, SERUM  MAGNESIUM  PHOSPHORUS  IRON AND TIBC  FERRITIN  VITAMIN B12  FOLATE RBC    EKG None  Radiology CT ABDOMEN PELVIS WO CONTRAST  Result Date: 03/01/2020 CLINICAL DATA:  Left abdominal pain, back pain x1 week EXAM: CT ABDOMEN AND PELVIS WITHOUT CONTRAST TECHNIQUE: Multidetector CT imaging of the abdomen and pelvis was performed following the standard protocol without IV contrast. COMPARISON:  12/28/2003 FINDINGS: Lower chest: Coronary calcifications. No pleural or pericardial effusion. Hepatobiliary: No focal liver abnormality is seen. No gallstones, gallbladder wall thickening, or biliary dilatation. Pancreas: Unremarkable. No pancreatic ductal dilatation or surrounding inflammatory changes. Spleen: Normal in size without focal abnormality. Adrenals/Urinary Tract: 4.3 cm left upper pole renal cyst. No hydronephrosis.  Adrenal glands unremarkable. Urinary bladder incompletely distended. Stomach/Bowel: Stomach is within normal limits. Appendix not identified. No evidence of bowel wall thickening, distention, or inflammatory changes. Vascular/Lymphatic: Minimal aortoiliac atherosclerosis (ICD10-170.0) without aneurysm. No abdominal or pelvic adenopathy. Reproductive: Multiple metallic seeds around the prostate. Other: No ascites.  No free air. Musculoskeletal: Innumerable small lytic lesions throughout all visualized bones. Mild T12 compression deformity, age indeterminate. IMPRESSION: 1. Innumerable lytic lesions involving all visualized bones suggesting metastatic disease or multiple myeloma. Electronically Signed   By: Lucrezia Europe M.D.   On: 03/01/2020 13:38    Procedures Procedures (including critical care time)  Medications Ordered in ED Medications  enoxaparin (LOVENOX) injection 40 mg (has no administration in time range)  0.9 %  sodium chloride infusion (has no administration in time range)  acetaminophen (TYLENOL) tablet 650 mg (has no administration in time range)    Or  acetaminophen (TYLENOL) suppository 650 mg (has no administration in time range)  ondansetron (ZOFRAN) tablet 4 mg (has no administration in time range)    Or  ondansetron (ZOFRAN) injection 4 mg (has no administration in time range)  polyethylene glycol (MIRALAX / GLYCOLAX) packet 17 g (has no administration in time range)  HYDROmorphone (DILAUDID) injection 0.5 mg (has no administration in time range)  cefTRIAXone (ROCEPHIN) 1 g in sodium chloride 0.9 % 100 mL IVPB (has no administration in time range)  amLODipine (NORVASC) tablet 5 mg (has no administration in time range)  doxazosin (CARDURA) tablet 4 mg (has no administration in time range)  pravastatin (PRAVACHOL) tablet 40 mg (has no administration in time range)  finasteride (PROSCAR) tablet 5 mg (has no administration in time range)  pamidronate (AREDIA) 60 mg in sodium  chloride 0.9 % 500 mL IVPB (has no administration in time range)  dexamethasone (DECADRON) injection 20 mg (has  no administration in time range)  sodium chloride 0.9 % bolus 500 mL (0 mLs Intravenous Stopped 03/01/20 1409)  sodium chloride 0.9 % bolus 500 mL (500 mLs Intravenous New Bag/Given 03/01/20 1517)  fentaNYL (SUBLIMAZE) injection 50 mcg (50 mcg Intravenous Given 03/01/20 1551)    ED Course  I have reviewed the triage vital signs and the nursing notes.  Pertinent labs & imaging results that were available during my care of the patient were reviewed by me and considered in my medical decision making (see chart for details).    MDM Rules/Calculators/A&P                     Patient is a 86 year old male who presents with back pain.  He also had pain going around to his flank area.  His labs show mild increase in his creatinine.  He also has a drop in his hemoglobin and increase in his calcium.  He does have evidence of metastatic disease with cystic lesions in his bones on CT scan.  He also has a T12 compression fracture of age-indeterminate which may be the etiology for his pain.  I spoke with Dr. Jana Hakim with oncology who recommends patient be admitted for hydration.  They will see the patient in the ED.  I spoke with the hospitalist who will admit the patient for further treatment.   Final Clinical Impression(s) / ED Diagnoses Final diagnoses:  AKI (acute kidney injury) ([place])  Metastatic malignant neoplasm, unspecified site [facility])  Hypercalcemia    Rx / DC Orders ED Discharge Orders    None       Malvin Johns, MD 03/01/20 1620

## 2020-03-01 NOTE — ED Triage Notes (Signed)
Pt c/o back pain x 1 week and some L flank pain "when I get up"; seen at urgent care yesterday for same; endorses some dysuria

## 2020-03-01 NOTE — Progress Notes (Signed)
Received report from ED Hanna,RN

## 2020-03-01 NOTE — Progress Notes (Signed)
Pt arrived to unit in w/c,pleasant, alert/oriented in no apparent distress. Orientated to his room/equipments, Pt's guide and menu provided. 3 side rails up, bed in lowest position. Call bell/telephone within reach. All bed wheels locked.  No belongings noted. No complaints voiced.

## 2020-03-01 NOTE — ED Notes (Signed)
Dinner tray at bedside

## 2020-03-01 NOTE — Progress Notes (Signed)
[place]  [phone] [phone]     ID: [name] DOB: 01-14-1929  MR#: [id number]  QIH#:[id number]  Patient Care Team: Jani Gravel, MD as PCP - General (Internal Medicine) Chauncey Cruel, MD OTHER MD:  CHIEF COMPLAINT: working diagnosis: multiple myeloma  CURRENT TREATMENT: fluids, pamidronate, dexamethasone   HISTORY OF CURRENT ILLNESS: Mr. Covault is an 86 y/o [place] man formerly followed by my partner Dr Julien Nordmann for M-Gus. We have an M-Spike of 0.87 documented 07/12/2012. [name] kappa/lambda light chains and total IgG were follower yearly until 01/2016, when they were 2.36 and 1517 respectively (unremarkable). He was lost to follow-up after that point.  Yesterday the patient presented to Urgent Care with poorly controlled pain. This was felt to be mussculoskeletal and he was started on a medrol dose-pak and robaxin. However his pain worsened and he presented to the ED at [facility] where vitals and exam were unremarkable but labs showed a creatinine of 1.84 (baseline 1.0), calcium 12.7 with albumin 2.8 and total protein 10.4 (globulin fraction 7.8). CT of the abdomen obtained for evaluation of his abdominal and back pain showed no hydronephrosis but multiple lytic lesons. We were consulted for further evaluation and treatment of likely multiple myeloma.  The patient's subsequent history is as detailed below.  INTERVAL HISTORY: Mr Sedgwick was evaluated in the ED roomwith his wife present and daughter Rodena Piety participating by speakerphone.  REVIEW OF SYSTEMS: He had been moving some furniture and started having worsening back pain about a week PTA. The pain does not radiate.  He denies urine changes (history or prostate cancer s/p seed radiation) or stool changes aside from mild constipation. Denies leg weakness or falls.   PAST MEDICAL HISTORY: Past Medical History:  Diagnosis Date  . Enlarged prostate   . Hypertension       PAST SURGICAL HISTORY: Past Surgical History:  Procedure Laterality Date  . EYE SURGERY    . PROSTATE SURGERY      FAMILY HISTORY Family History  Problem Relation Age of Onset  . Hypertension Mother     SOCIAL HISTORY:  Retired, used to work for the [employer] Lives with wife [place]. Has 4 children, 2 in [place], one in [place], one in [place]; 5 grandchildren and 5 great-grandchildren. Attands a [employer].    ADVANCED DIRECTIVES: at the initial visit confirmed the patient's wife is his HCPOA   HEALTH MAINTENANCE: Social History   Tobacco Use  . Smoking status: Never Smoker  . Smokeless tobacco: Never Used  Substance Use Topics  . Alcohol use: Never  . Drug use: Never     Colonoscopy:  PSA:  Bone density:   No Known Allergies  Current Facility-Administered Medications  Medication Dose Route Frequency Provider Last Rate Last Admin  . 0.9 %  sodium chloride infusion   Intravenous Continuous Pahwani, Rinka R, MD      . acetaminophen (TYLENOL) tablet 650 mg  650 mg Oral Q6H PRN Pahwani, Rinka R, MD       Or  . acetaminophen (TYLENOL) suppository 650 mg  650 mg Rectal Q6H PRN Pahwani, Rinka R, MD      . amLODipine (NORVASC) tablet 5 mg  5 mg Oral Daily Pahwani, Rinka R, MD      . cefTRIAXone (ROCEPHIN) 1 g in sodium chloride 0.9 % 100 mL IVPB  1 g Intravenous Q24H Pahwani, Rinka R, MD      . dexamethasone (DECADRON) injection 20 mg  20 mg Intravenous Q24H Pahwani, Rinka R, MD      . doxazosin (CARDURA) tablet 4 mg  4 mg Oral QHS Pahwani, Rinka R, MD      . enoxaparin (LOVENOX) injection 40 mg  40 mg Subcutaneous Q24H Pahwani, Rinka R, MD      . finasteride (PROSCAR) tablet 5 mg  5 mg Oral Daily Pahwani, Rinka R, MD      . HYDROmorphone (DILAUDID) injection 0.5 mg  0.5 mg Intravenous Q4H PRN Pahwani, Rinka R, MD      . ondansetron (ZOFRAN) tablet 4 mg  4 mg Oral Q6H PRN Pahwani, Rinka R, MD       Or  . ondansetron (ZOFRAN) injection 4 mg   4 mg Intravenous Q6H PRN Pahwani, Rinka R, MD      . pamidronate (AREDIA) 60 mg in sodium chloride 0.9 % 500 mL IVPB  60 mg Intravenous Once Pahwani, Rinka R, MD      . polyethylene glycol (MIRALAX / GLYCOLAX) packet 17 g  17 g Oral Daily PRN Pahwani, Rinka R, MD      . pravastatin (PRAVACHOL) tablet 40 mg  40 mg Oral Daily Pahwani, Rinka R, MD       Current Outpatient Medications  Medication Sig Dispense Refill  . acetaminophen (TYLENOL) 500 MG tablet Take 1,000 mg by mouth every 6 (six) hours as needed for mild pain.    [name] amLODipine (NORVASC) 5 MG tablet Take 5 mg by mouth daily.    [name] doxazosin (CARDURA) 4 MG tablet Take 4 mg by mouth at bedtime.    . finasteride (PROSCAR) 5 MG tablet Take 5 mg by mouth daily.    . fosinopril (MONOPRIL) 40 MG tablet Take 40 mg by mouth daily.    . indapamide (LOZOL) 2.5 MG tablet Take 2.5 mg by mouth daily.     . pravastatin (PRAVACHOL) 40 MG tablet Take 40 mg by mouth daily.    . predniSONE (DELTASONE) 20 MG tablet Take 1 tablet (20 mg total) by mouth daily with breakfast. 7 tablet 0  . vitamin B-12 (CYANOCOBALAMIN) 1000 MCG tablet Take 1,000 mcg by mouth daily.    [name] azithromycin (ZITHROMAX) 250 MG tablet Take 250-500 mg by mouth as directed. Take 2 tablets (517m) now, then 1 tablet (2568m once a day until gone.    . methocarbamol (ROBAXIN) 500 MG tablet Take 1 tablet (500 mg total) by mouth 2 (two) times daily. 30 tablet 0    OBJECTIVE: older African American man examined in bed  Vitals:   03/01/20 1515 03/01/20 1530  BP:  138/70  Pulse: 82 82  Resp:  16  Temp:    SpO2: 96% 98%     Body mass index is 28.59 kg/m.   Wt Readings from Last 3 Encounters:  03/01/20 205 lb (93 kg)  02/29/20 205 lb (93 kg)  01/16/16 218 lb 11.2 oz (99.2 kg)    LAB RESULTS:  CMP     Component Value Date/Time   NA 133 (L) 03/01/2020 1052   NA 140 01/09/2016 1045   K 3.5 03/01/2020 1052   K 3.8 01/09/2016 1045   CL 100 03/01/2020 1052   CO2 24 03/01/2020  1052   CO2 27 01/09/2016 1045   GLUCOSE 174 (H) 03/01/2020 1052   GLUCOSE 89 01/09/2016 1045   BUN 26 (H) 03/01/2020 1052   BUN 14.7 01/09/2016 1045   CREATININE 1.84 (H) 03/01/2020 1052   CREATININE 1.0 01/09/2016 1045  CALCIUM 12.7 (H) 03/01/2020 1052   CALCIUM 9.2 01/09/2016 1045   PROT 10.4 (H) 03/01/2020 1052   PROT 7.3 01/09/2016 1045   ALBUMIN 2.8 (L) 03/01/2020 1052   ALBUMIN 3.7 01/09/2016 1045   AST 24 03/01/2020 1052   AST 18 01/09/2016 1045   ALT 17 03/01/2020 1052   ALT 15 01/09/2016 1045   ALKPHOS 51 03/01/2020 1052   ALKPHOS 60 01/09/2016 1045   BILITOT 0.7 03/01/2020 1052   BILITOT 0.51 01/09/2016 1045   GFRNONAA 32 (L) 03/01/2020 1052   GFRAA 37 (L) 03/01/2020 1052    Lab Results  Component Value Date   TOTALPROTELP 6.9 07/12/2012   ALBUMINELP 56.1 07/12/2012   A1GS 3.5 07/12/2012   A2GS 9.8 07/12/2012   BETS 5.1 07/12/2012   BETA2SER 3.6 07/12/2012   GAMS 21.9 (H) 07/12/2012   MSPIKE 0.87 07/12/2012   SPEI * 07/12/2012    Lab Results  Component Value Date   KPAFRELGTCHN 2.42 (H) 01/11/2015   LAMBDASER 1.16 01/11/2015   KAPLAMBRATIO 2.36 (H) 01/09/2016    Lab Results  Component Value Date   WBC 6.4 03/01/2020   NEUTROABS 5.5 03/01/2020   HGB 9.6 (L) 03/01/2020   HCT 31.1 (L) 03/01/2020   MCV 81.0 03/01/2020   PLT 178 03/01/2020    '@LASTCHEMISTRY' @  No results found for: LABCA2  No components found for: [id number]  No results for input(s): INR in the last 168 hours.  No results found for: LABCA2  No results found for: VXB939  No results found for: QZE092  No results found for: ZRA076  No results found for: CA2729  No components found for: HGQUANT  No results found for: CEA1 / No results found for: CEA1   No results found for: AFPTUMOR  No results found for: CHROMOGRNA  No results found for: PSA1  Admission on 03/01/2020  Component Date Value Ref Range Status  . Sodium 03/01/2020 133* 135 - 145 mmol/L Final  .  Potassium 03/01/2020 3.5  3.5 - 5.1 mmol/L Final  . Chloride 03/01/2020 100  98 - 111 mmol/L Final  . CO2 03/01/2020 24  22 - 32 mmol/L Final  . Glucose, Bld 03/01/2020 174* 70 - 99 mg/dL Final   Glucose reference range applies only to samples taken after fasting for at least 8 hours.  . BUN 03/01/2020 26* 8 - 23 mg/dL Final  . Creatinine, Ser 03/01/2020 1.84* 0.61 - 1.24 mg/dL Final  . Calcium 03/01/2020 12.7* 8.9 - 10.3 mg/dL Final  . Total Protein 03/01/2020 10.4* 6.5 - 8.1 g/dL Final  . Albumin 03/01/2020 2.8* 3.5 - 5.0 g/dL Final  . AST 03/01/2020 24  15 - 41 U/L Final  . ALT 03/01/2020 17  0 - 44 U/L Final  . Alkaline Phosphatase 03/01/2020 51  38 - 126 U/L Final  . Total Bilirubin 03/01/2020 0.7  0.3 - 1.2 mg/dL Final  . GFR calc non Af Amer 03/01/2020 32* >60 mL/min Final  . GFR calc Af Amer 03/01/2020 37* >60 mL/min Final  . Anion gap 03/01/2020 9  5 - 15 Final   Performed at [facility], [place] [address] [place], [place] [address]  . Lipase 03/01/2020 17  11 - 51 U/L Final   Performed at [facility], [place] [address] [place], [place] [address]  . WBC 03/01/2020 6.4  4.0 - 10.5 K/uL Final  . RBC 03/01/2020 3.84* 4.22 - 5.81 MIL/uL Final  . Hemoglobin 03/01/2020 9.6* 13.0 - 17.0  g/dL Final  . HCT 03/01/2020 31.1* 39.0 - 52.0 % Final  . MCV 03/01/2020 81.0  80.0 - 100.0 fL Final  . MCH 03/01/2020 25.0* 26.0 - 34.0 pg Final  . MCHC 03/01/2020 30.9  30.0 - 36.0 g/dL Final  . RDW 03/01/2020 13.0  11.5 - 15.5 % Final  . Platelets 03/01/2020 178  150 - 400 K/uL Final  . nRBC 03/01/2020 0.0  0.0 - 0.2 % Final  . Neutrophils Relative % 03/01/2020 85  % Final  . Neutro Abs 03/01/2020 5.5  1.7 - 7.7 K/uL Final  . Lymphocytes Relative 03/01/2020 14  % Final  . Lymphs Abs 03/01/2020 0.9  0.7 - 4.0 K/uL Final  . Monocytes Relative 03/01/2020 1  % Final  . Monocytes Absolute 03/01/2020 0.0* 0.1 - 1.0 K/uL Final  . Eosinophils Relative 03/01/2020 0  % Final  . Eosinophils  Absolute 03/01/2020 0.0  0.0 - 0.5 K/uL Final  . Basophils Relative 03/01/2020 0  % Final  . Basophils Absolute 03/01/2020 0.0  0.0 - 0.1 K/uL Final  . Immature Granulocytes 03/01/2020 0  % Final  . Abs Immature Granulocytes 03/01/2020 0.02  0.00 - 0.07 K/uL Final   Performed at [facility], [place] [address], [place], [place] [address]  . Color, Urine 03/01/2020 AMBER* YELLOW Final   BIOCHEMICALS MAY BE AFFECTED BY COLOR  . APPearance 03/01/2020 CLOUDY* CLEAR Final  . Specific Gravity, Urine 03/01/2020 1.021  1.005 - 1.030 Final  . pH 03/01/2020 5.0  5.0 - 8.0 Final  . Glucose, UA 03/01/2020 NEGATIVE  NEGATIVE mg/dL Final  . Hgb urine dipstick 03/01/2020 NEGATIVE  NEGATIVE Final  . Bilirubin Urine 03/01/2020 NEGATIVE  NEGATIVE Final  . Ketones, ur 03/01/2020 NEGATIVE  NEGATIVE mg/dL Final  . Protein, ur 03/01/2020 100* NEGATIVE mg/dL Final  . Nitrite 03/01/2020 NEGATIVE  NEGATIVE Final  . Chalmers Guest 03/01/2020 NEGATIVE  NEGATIVE Final  . RBC / HPF 03/01/2020 0-5  0 - 5 RBC/hpf Final  . WBC, UA 03/01/2020 0-5  0 - 5 WBC/hpf Final  . Bacteria, UA 03/01/2020 FEW* NONE SEEN Final  . Squamous Epithelial / LPF 03/01/2020 0-5  0 - 5 Final  . Mucus 03/01/2020 PRESENT   Final  . Hyaline Casts, UA 03/01/2020 PRESENT   Final  . Amorphous Crystal 03/01/2020 PRESENT   Final   Performed at [facility], [place] [address], [place], [place] [address]  Admission on 02/29/2020, Discharged on 02/29/2020  Component Date Value Ref Range Status  . Glucose, UA 02/29/2020 NEGATIVE  NEGATIVE mg/dL Final  . Bilirubin Urine 02/29/2020 NEGATIVE  NEGATIVE Final  . Ketones, ur 02/29/2020 NEGATIVE  NEGATIVE mg/dL Final  . Specific Gravity, Urine 02/29/2020 1.025  1.005 - 1.030 Final  . Hgb urine dipstick 02/29/2020 MODERATE* NEGATIVE Final  . pH 02/29/2020 6.5  5.0 - 8.0 Final  . Protein, ur 02/29/2020 100* NEGATIVE mg/dL Final  . Urobilinogen, UA 02/29/2020 0.2  0.0 - 1.0 mg/dL Final  . Nitrite  02/29/2020 NEGATIVE  NEGATIVE Final  . Chalmers Guest 02/29/2020 TRACE* NEGATIVE Final   Biochemical Testing Only. Please order routine urinalysis from main lab if confirmatory testing is needed.    (this displays the last labs from the last 3 days)  Lab Results  Component Value Date   TOTALPROTELP 6.9 07/12/2012   ALBUMINELP 56.1 07/12/2012   A1GS 3.5 07/12/2012   A2GS 9.8 07/12/2012   BETS 5.1 07/12/2012   BETA2SER 3.6 07/12/2012   GAMS 21.9 (  H) 07/12/2012   MSPIKE 0.87 07/12/2012   SPEI * 07/12/2012   (this displays SPEP labs)  Lab Results  Component Value Date   KPAFRELGTCHN 2.42 (H) 01/11/2015   LAMBDASER 1.16 01/11/2015   KAPLAMBRATIO 2.36 (H) 01/09/2016   (kappa/lambda light chains)  No results found for: HGBA, HGBA2QUANT, HGBFQUANT, HGBSQUAN (Hemoglobinopathy evaluation)   Lab Results  Component Value Date   LDH 136 01/09/2016    Lab Results  Component Value Date   IRON 66 07/23/2009   TIBC 276 07/23/2009   IRONPCTSAT 24 07/23/2009   (Iron and TIBC)  Lab Results  Component Value Date   FERRITIN 158 07/23/2009    Urinalysis    Component Value Date/Time   COLORURINE AMBER (A) 03/01/2020 1052   APPEARANCEUR CLOUDY (A) 03/01/2020 1052   LABSPEC 1.021 03/01/2020 1052   PHURINE 5.0 03/01/2020 1052   GLUCOSEU NEGATIVE 03/01/2020 1052   HGBUR NEGATIVE 03/01/2020 1052   BILIRUBINUR NEGATIVE 03/01/2020 1052   KETONESUR NEGATIVE 03/01/2020 1052   PROTEINUR 100 (A) 03/01/2020 1052   UROBILINOGEN 0.2 02/29/2020 1537   NITRITE NEGATIVE 03/01/2020 1052   LEUKOCYTESUR NEGATIVE 03/01/2020 1052     STUDIES: CT ABDOMEN PELVIS WO CONTRAST  Result Date: 03/01/2020 CLINICAL DATA:  Left abdominal pain, back pain x1 week EXAM: CT ABDOMEN AND PELVIS WITHOUT CONTRAST TECHNIQUE: Multidetector CT imaging of the abdomen and pelvis was performed following the standard protocol without IV contrast. COMPARISON:  12/28/2003 FINDINGS: Lower chest: Coronary calcifications. No  pleural or pericardial effusion. Hepatobiliary: No focal liver abnormality is seen. No gallstones, gallbladder wall thickening, or biliary dilatation. Pancreas: Unremarkable. No pancreatic ductal dilatation or surrounding inflammatory changes. Spleen: Normal in size without focal abnormality. Adrenals/Urinary Tract: 4.3 cm left upper pole renal cyst. No hydronephrosis. Adrenal glands unremarkable. Urinary bladder incompletely distended. Stomach/Bowel: Stomach is within normal limits. Appendix not identified. No evidence of bowel wall thickening, distention, or inflammatory changes. Vascular/Lymphatic: Minimal aortoiliac atherosclerosis (ICD10-170.0) without aneurysm. No abdominal or pelvic adenopathy. Reproductive: Multiple metallic seeds around the prostate. Other: No ascites.  No free air. Musculoskeletal: Innumerable small lytic lesions throughout all visualized bones. Mild T12 compression deformity, age indeterminate. IMPRESSION: 1. Innumerable lytic lesions involving all visualized bones suggesting metastatic disease or multiple myeloma. Electronically Signed   By: Lucrezia Europe M.D.   On: 03/01/2020 13:38    ELIGIBLE FOR AVAILABLE RESEARCH PROTOCOL:   ASSESSMENT: 86 y.o. [place] man with a history of M-GUS, now presenting with a high globulin fraction, worsening renal function, hypercalcemia, anemia and multiple lytic bone lesions.   (1) working diagnosis is myeloma: obtain myeloma panel, 24 hour urine for protein, creatinine and light chains, and kappa/lambda serum light chains. He will eventually need bone marrow biopsy  (2) initial treatment aimed at assisting renal function and controlling serum Ca++: pamidronate, IVF, dexamethasone as ordered. This will be followed by remission induction treatment as outpatient  PLAN: See #1 and #2 above. I discussed the situation with the patient and his family and they understand while we need additional studies to confirm the diagnosis all the data we have  is c/w a diagnosis of multiple myeloma. The M-Gus documented previously in this patient is always a precursor lesion (all patients with myeloma initially had M-GUS, even if not documented) but only approximately 1% of M-GUS patients per year progress to myeloma.  Mr Kathan is not a transplant candidate. His cancer is not curable. However in many cases it can be brouight into remission with targeted and immunomodulatory agents.  Treatment is generally effective, well-tolerated, and can provide many patients with years of good-quality life. That is our hope for Mr Coe.  I anticipate he likely will be ready for discharge by 03/03/2020. I will make sure we see him in the [facility] 04/08 and we will initiate treatment then.  Will follow with you.   Chauncey Cruel, MD   03/01/2020 4:03 PM Medical Oncology and Hematology [facility] [address] [place], [place] [address] Tel. [phone]    Fax. [phone]

## 2020-03-02 ENCOUNTER — Other Ambulatory Visit: Payer: Self-pay | Admitting: Oncology

## 2020-03-02 DIAGNOSIS — D649 Anemia, unspecified: Secondary | ICD-10-CM

## 2020-03-02 DIAGNOSIS — C9000 Multiple myeloma not having achieved remission: Secondary | ICD-10-CM

## 2020-03-02 DIAGNOSIS — N179 Acute kidney failure, unspecified: Secondary | ICD-10-CM

## 2020-03-02 DIAGNOSIS — Z7189 Other specified counseling: Secondary | ICD-10-CM | POA: Insufficient documentation

## 2020-03-02 LAB — COMPREHENSIVE METABOLIC PANEL
ALT: 15 U/L (ref 0–44)
AST: 43 U/L — ABNORMAL HIGH (ref 15–41)
Albumin: 2.3 g/dL — ABNORMAL LOW (ref 3.5–5.0)
Alkaline Phosphatase: 40 U/L (ref 38–126)
Anion gap: 5 (ref 5–15)
BUN: 27 mg/dL — ABNORMAL HIGH (ref 8–23)
CO2: 25 mmol/L (ref 22–32)
Calcium: 10.9 mg/dL — ABNORMAL HIGH (ref 8.9–10.3)
Chloride: 106 mmol/L (ref 98–111)
Creatinine, Ser: 1.53 mg/dL — ABNORMAL HIGH (ref 0.61–1.24)
GFR calc Af Amer: 46 mL/min — ABNORMAL LOW (ref >60)
GFR calc non Af Amer: 40 mL/min — ABNORMAL LOW (ref >60)
Glucose, Bld: 143 mg/dL — ABNORMAL HIGH (ref 70–99)
Potassium: 4 mmol/L (ref 3.5–5.1)
Sodium: 136 mmol/L (ref 135–145)
Total Bilirubin: 0.5 mg/dL (ref 0.3–1.2)
Total Protein: 8.5 g/dL — ABNORMAL HIGH (ref 6.5–8.1)

## 2020-03-02 LAB — URINE CULTURE
Culture: <10000 — AB
Culture: NO GROWTH

## 2020-03-02 LAB — CBC
HCT: 26 % — ABNORMAL LOW (ref 39.0–52.0)
Hemoglobin: 8.2 g/dL — ABNORMAL LOW (ref 13.0–17.0)
MCH: 25.5 pg — ABNORMAL LOW (ref 26.0–34.0)
MCHC: 31.5 g/dL (ref 30.0–36.0)
MCV: 80.7 fL (ref 80.0–100.0)
Platelets: 155 10*3/uL (ref 150–400)
RBC: 3.22 MIL/uL — ABNORMAL LOW (ref 4.22–5.81)
RDW: 13.1 % (ref 11.5–15.5)
WBC: 9 10*3/uL (ref 4.0–10.5)
nRBC: 0 % (ref 0.0–0.2)

## 2020-03-02 LAB — CALCIUM, IONIZED: Calcium, Ionized, Serum: 7.1 mg/dL — ABNORMAL HIGH (ref 4.5–5.6)

## 2020-03-02 MED ORDER — ACYCLOVIR 400 MG PO TABS: 400.0000 mg | ORAL_TABLET | Freq: Two times a day (BID) | ORAL | 3 refills | Status: DC

## 2020-03-02 NOTE — Plan of Care (Signed)

## 2020-03-02 NOTE — Plan of Care (Signed)

## 2020-03-02 NOTE — Evaluation (Signed)
Physical Therapy Evaluation Patient Details Name: Joe [name] MRN: [id number] DOB: Mar 03, 1929 Today's Date: 03/02/2020   History of Present Illness  Pt is an 86 y/o male admitted secondary to back pain, likely secondary to multiple myeloma. Pt also found to have an AKI and a UTI. PMH including but not limited to HTN.    Clinical Impression  Pt presented supine in bed with HOB elevated, awake and willing to participate in therapy session. Prior to admission, pt reported that he was independent with all functional mobility and ADLs. Pt lives with his wife in a single level home with several steps to enter. At the time of evaluation, pt overall at a supervision to min guard level with functional mobility including ambulation with and without an AD. Pt feels more comfortable using a RW to ambulate this session. PT also instructed pt in log roll technique for bed mobility for comfort. PT will continue to follow pt acutely to progress mobility as tolerated and to ensure a safe d/c home.     Follow Up Recommendations No PT follow up    Equipment Recommendations  Rolling walker with 5" wheels    Recommendations for Other Services       Precautions / Restrictions Precautions Precautions: Fall Restrictions Weight Bearing Restrictions: No      Mobility  Bed Mobility Overal bed mobility: Needs Assistance Bed Mobility: Rolling;Sidelying to Sit;Sit to Sidelying Rolling: Supervision Sidelying to sit: Supervision     Sit to sidelying: Supervision General bed mobility comments: cueing for log roll technique  Transfers Overall transfer level: Needs assistance Equipment used: None Transfers: Sit to/from Stand Sit to Stand: Min guard         General transfer comment: for safety  Ambulation/Gait Ambulation/Gait assistance: Min guard Gait Distance (Feet): 40 Feet Assistive device: Rolling walker (2 wheeled);None Gait Pattern/deviations: Step-through pattern;Decreased stride  length Gait velocity: decreased   General Gait Details: pt ambulating with and without RW in room with min guard for safety; no LOB or need for physical assistance  Stairs            Wheelchair Mobility    Modified Rankin (Stroke Patients Only)       Balance Overall balance assessment: Needs assistance Sitting-balance support: Feet supported Sitting balance-Leahy Scale: Good     Standing balance support: During functional activity;No upper extremity supported Standing balance-Leahy Scale: Fair                               Pertinent Vitals/Pain Pain Assessment: Faces Faces Pain Scale: Hurts little more Pain Location: back Pain Descriptors / Indicators: Guarding Pain Intervention(s): Monitored during session;Repositioned    Home Living Family/patient expects to be discharged to:: Private residence Living Arrangements: Spouse/significant other Available Help at Discharge: Family Type of Home: House Home Access: Stairs to enter Entrance Stairs-Rails: Psychiatric nurse of Steps: 4 Home Layout: One level Home Equipment: None      Prior Function Level of Independence: Independent               Hand Dominance        Extremity/Trunk Assessment   Upper Extremity Assessment Upper Extremity Assessment: Overall WFL for tasks assessed    Lower Extremity Assessment Lower Extremity Assessment: Overall WFL for tasks assessed       Communication   Communication: No difficulties  Cognition Arousal/Alertness: Awake/alert Behavior During Therapy: WFL for tasks assessed/performed Overall Cognitive Status: Within Functional Limits  for tasks assessed                                        General Comments      Exercises     Assessment/Plan    PT Assessment Patient needs continued PT services  PT Problem List Decreased balance;Decreased mobility;Decreased coordination;Decreased knowledge of use of  DME;Decreased safety awareness;Decreased knowledge of precautions;Pain       PT Treatment Interventions DME instruction;Gait training;Stair training;Therapeutic activities;Functional mobility training;Therapeutic exercise;Balance training;Neuromuscular re-education;Patient/family education    PT Goals (Current goals can be found in the Care Plan section)  Acute Rehab PT Goals Patient Stated Goal: "go home" PT Goal Formulation: With patient Time For Goal Achievement: 03/16/20 Potential to Achieve Goals: Good    Frequency Min 3X/week   Barriers to discharge        Co-evaluation               AM-PAC PT "6 Clicks" Mobility  Outcome Measure Help needed turning from your back to your side while in a flat bed without using bedrails?: None Help needed moving from lying on your back to sitting on the side of a flat bed without using bedrails?: None Help needed moving to and from a bed to a chair (including a wheelchair)?: None Help needed standing up from a chair using your arms (e.g., wheelchair or bedside chair)?: None Help needed to walk in hospital room?: None Help needed climbing 3-5 steps with a railing? : A Little 6 Click Score: 23    End of Session   Activity Tolerance: Patient tolerated treatment well Patient left: in bed;with call bell/phone within reach Nurse Communication: Mobility status PT Visit Diagnosis: Other abnormalities of gait and mobility (R26.89);Muscle weakness (generalized) (M62.81)    Time: 4709-2957 PT Time Calculation (min) (ACUTE ONLY): 13 min   Charges:   PT Evaluation $PT Eval Low Complexity: 1 Low          Eduard Clos, PT, DPT  Acute Rehabilitation Services Pager [phone] Office [place] 03/02/2020, 4:47 PM

## 2020-03-02 NOTE — Progress Notes (Signed)
PROGRESS NOTE    Joe [name]  QRF:[id number] DOB: [birth year] DOA: 03/01/2020 PCP: Jani Gravel, MD   Brief Narrative:  HPI: Joe [name] is a 86 y.o. male with medical history significant of hypertension, hyperlipidemia, BPH presents to emergency department due to severe back pain.  Patient tells me that his pain started a week before while he was moving some furniture and he felt a pop at that time although his back did not start hurting immediately.  Patient tells me that his pain is progressive, no history of trauma or fall.  He denies radiation to his lower extremities, numbness tingling sensation, urinary or bowel incontinence.  He tells me that his pain aggravates with walking and relieved with rest.  He went to urgent care yesterday and was prescribed Robaxin and prednisone.  Reports dysuria and foul-smelling urine since 1 week.  Denies association with fever, chills, nausea, vomiting, hematuria, abdominal pain.  Reports constipation and has been taking laxatives which helps.  His last bowel movement was yesterday.  No history of hematemesis or melena.  Has history of MGUS-used to see oncology Dr. Julien Nordmann.  No history of headache, blurry vision, dizziness, lightheadedness, chest pain, shortness of breath, palpitation, leg swelling, weight loss, decreased appetite, generalized weakness or lethargy.  He lives with his wife at home.  No history of smoking, alcohol, recent drug use.  ED Course: Upon arrival to ED: Patient's vital signs stable.  He is afebrile with no leukocytosis, CMP shows sodium of 133, AKI, hypercalcemia of 12.7, elevated protein level of 10.4, lipase: WNL, H&H shows 9.6/31.1, CT abdomen/pelvis was obtained which was lactic lesions in bone concerning for multiple myeloma versus metastatic disease.  Patient received IV fluids in ED.  [facility]eme oncologist consulted by EDP.  [facility] hospitalist consulted for admission for hypercalcemia and UTI  Assessment & Plan:   Principal  Problem:   Back pain Active Problems:   Hypertension   Hypercalcemia   AKI (acute kidney injury) ([place])   Normocytic anemia   HLD (hyperlipidemia)   BPH (benign prostatic hyperplasia)   Elevated total protein   Hyponatremia   Lower back pain/ MGUS/ > multiple myeloma: -Likely secondary to multiple myeloma.  Has history of MGUS.  Hematology/oncology on board.  Further recommendations myeloma panel, 24-hour urine for protein, creatinine and light chains, kappa/lambda serum light chains were ordered.  Patient was started on pamidronate 60 mg IV once, dexamethasone 20 mg once daily for 2 doses.  Appreciate their help.  PT on board.  To be seen by them today.  Continue Dilaudid as needed.  Back pain is improved.  AKI: Likely secondary to multiple myeloma.  Presenting creatinine 1.84.  Improved to 1.53 today.  Continue to avoid nephrotoxic agents.  Ultrasound renal unremarkable.  Hypercalcemia: 10.4 at admission.  Currently 8.5.  Received 1 dose of pamidronate and continues to be on IV fluids.  Repeat labs in the morning.  UTI: Patient came in with urinary complaint.  UA cloudy with some bacteria but no nitrites or leukoesterase.  Started on Rocephin.  Doubt real UTI however continue Rocephin and follow culture and tailor antibiotics accordingly.  Essential hypertension: Blood pressure is stable -Continue amlodipine, continue to hold ACE and thiazide diuretic for now due to AKI  Normocytic anemia: H&H: 9.6/31.1 down to 8.2 hemoglobin today. -No history of melena.  Iron studies indicate anemia of chronic disease.  Will check FOBT.  B12 and folate elevated.  Hyperlipidemia: Continue statin  Hyponatremia: Sodium 133 upon presentation.  Resolved.  Likely due to dehydration.  Constipation: Start on MiraLAX  BPH: Continue Proscar and doxazosin  DVT prophylaxis: SCD.   Code Status: DNR, present on admission.   Family Communication: None present at bedside.  Plan of care discussed  with patient in length and he verbalized understanding and agreed with it. Patient is from: Home Disposition Plan: Home Barriers to discharge: Pending further work-up for multiple myeloma and signing off from oncology.   Estimated body mass index is 28.59 kg/m as calculated from the following:   Height as of this encounter: '5\' 11"'  (1.803 m).   Weight as of this encounter: 93 kg.      Nutritional status:               Consultants:   Oncology  Procedures:   None  Antimicrobials:  Anti-infectives (From admission, onward)   Start     Dose/Rate Route Frequency Ordered Stop   03/01/20 1600  cefTRIAXone (ROCEPHIN) 1 g in sodium chloride 0.9 % 100 mL IVPB     1 g 200 mL/hr over 30 Minutes Intravenous Every 24 hours 03/01/20 1547           Subjective: Seen and examined.  Back pain is improving.  No new complaint.  Objective: Vitals:   03/01/20 1832 03/01/20 1922 03/02/20 0449 03/02/20 0735  BP: (!) 163/83 (!) 151/66 133/69 (!) 144/66  Pulse: 79 77 (!) 57 61  Resp: '17 16 16 18  ' Temp: 98.8 F (37.1 C) 98.4 F (36.9 C) 98.3 F (36.8 C) 98.1 F (36.7 C)  TempSrc: Axillary Oral Oral Oral  SpO2: 94% 98% 97% 97%  Weight:      Height:        Intake/Output Summary (Last 24 hours) at 03/02/2020 0944 Last data filed at 03/02/2020 0000 Gross per 24 hour  Intake 2213.54 ml  Output --  Net 2213.54 ml   Filed Weights   03/01/20 1035  Weight: 93 kg    Examination:  General exam: Appears calm and comfortable  Respiratory system: Clear to auscultation. Respiratory effort normal. Cardiovascular system: S1 & S2 heard, RRR. No JVD, murmurs, rubs, gallops or clicks. No pedal edema. Gastrointestinal system: Abdomen is nondistended, soft and nontender. No organomegaly or masses felt. Normal bowel sounds heard. Central nervous system: Alert and oriented. No focal neurological deficits. Extremities: Symmetric 5 x 5 power. Skin: No rashes, lesions or ulcers Psychiatry:  Judgement and insight appear normal. Mood & affect appropriate.    Data Reviewed: I have personally reviewed following labs and imaging studies  CBC: Recent Labs  Lab 03/01/20 1052 03/01/20 1610 03/02/20 0513  WBC 6.4 7.2 9.0  NEUTROABS 5.5  --   --   HGB 9.6* 8.7* 8.2*  HCT 31.1* 28.9* 26.0*  MCV 81.0 82.3 80.7  PLT 178 170 785   Basic Metabolic Panel: Recent Labs  Lab 03/01/20 1052 03/01/20 1610 03/02/20 0513  NA 133*  --  136  K 3.5  --  4.0  CL 100  --  106  CO2 24  --  25  GLUCOSE 174*  --  143*  BUN 26*  --  27*  CREATININE 1.84* 1.55* 1.53*  CALCIUM 12.7*  --  10.9*  MG  --  1.6*  --   PHOS  --  2.3*  --    GFR: Estimated Creatinine Clearance: 38.9 mL/min (A) (by C-G formula based on SCr of 1.53 mg/dL (H)). Liver Function Tests: Recent Labs  Lab 03/01/20 1052 03/02/20 0513  AST 24 43*  ALT 17 15  ALKPHOS 51 40  BILITOT 0.7 0.5  PROT 10.4* 8.5*  ALBUMIN 2.8* 2.3*   Recent Labs  Lab 03/01/20 1052  LIPASE 17   No results for input(s): AMMONIA in the last 168 hours. Coagulation Profile: No results for input(s): INR, PROTIME in the last 168 hours. Cardiac Enzymes: No results for input(s): CKTOTAL, CKMB, CKMBINDEX, TROPONINI in the last 168 hours. BNP (last 3 results) No results for input(s): PROBNP in the last 8760 hours. HbA1C: No results for input(s): HGBA1C in the last 72 hours. CBG: No results for input(s): GLUCAP in the last 168 hours. Lipid Profile: No results for input(s): CHOL, HDL, LDLCALC, TRIG, CHOLHDL, LDLDIRECT in the last 72 hours. Thyroid Function Tests: No results for input(s): TSH, T4TOTAL, FREET4, T3FREE, THYROIDAB in the last 72 hours. Anemia Panel: Recent Labs    03/01/20 1610  VITAMINB12 1,457*  FERRITIN 375*  TIBC 238*  IRON 49   Sepsis Labs: No results for input(s): PROCALCITON, LATICACIDVEN in the last 168 hours.  Recent Results (from the past 240 hour(s))  Urine culture     Status: Abnormal   Collection  Time: 02/29/20  4:11 PM   Specimen: Urine, Random  Result Value Ref Range Status   Specimen Description URINE, RANDOM  Final   Special Requests NONE  Final   Culture (A)  Final    <10,000 COLONIES/mL INSIGNIFICANT GROWTH Performed at [facility], [address], [place], [place] [address]    Report Status 03/02/2020 FINAL  Final  SARS CORONAVIRUS 2 (TAT 6-24 HRS) Nasopharyngeal Nasopharyngeal Swab     Status: None   Collection Time: 03/01/20  3:29 PM   Specimen: Nasopharyngeal Swab  Result Value Ref Range Status   SARS Coronavirus 2 NEGATIVE NEGATIVE Final    Comment: (NOTE) SARS-CoV-2 target nucleic acids are NOT DETECTED. The SARS-CoV-2 RNA is generally detectable in upper and lower respiratory specimens during the acute phase of infection. Negative results do not preclude SARS-CoV-2 infection, do not rule out co-infections with other pathogens, and should not be used as the sole basis for treatment or other patient management decisions. Negative results must be combined with clinical observations, patient history, and epidemiological information. The expected result is Negative. Fact Sheet for Patients: SugarRoll.be Fact Sheet for Healthcare Providers: https://www.woods-mathews.com/ This test is not yet approved or cleared by the [place] FDA and  has been authorized for detection and/or diagnosis of SARS-CoV-2 by FDA under an Emergency Use Authorization (EUA). This EUA will remain  in effect (meaning this test can be used) for the duration of the COVID-19 declaration under Section 56 4(b)(1) of the Act, 21 U.S.C. section 360bbb-3(b)(1), unless the authorization is terminated or revoked sooner. Performed at [facility], [place] [address], [place], [place] [address]       Radiology Studies: CT ABDOMEN PELVIS WO CONTRAST  Result Date: 03/01/2020 CLINICAL DATA:  Left abdominal pain, back pain x1 week EXAM: CT ABDOMEN  AND PELVIS WITHOUT CONTRAST TECHNIQUE: Multidetector CT imaging of the abdomen and pelvis was performed following the standard protocol without IV contrast. COMPARISON:  12/28/2003 FINDINGS: Lower chest: Coronary calcifications. No pleural or pericardial effusion. Hepatobiliary: No focal liver abnormality is seen. No gallstones, gallbladder wall thickening, or biliary dilatation. Pancreas: Unremarkable. No pancreatic ductal dilatation or surrounding inflammatory changes. Spleen: Normal in size without focal abnormality. Adrenals/Urinary Tract: 4.3 cm left upper pole renal cyst. No hydronephrosis. Adrenal glands unremarkable. Urinary bladder incompletely distended.  Stomach/Bowel: Stomach is within normal limits. Appendix not identified. No evidence of bowel wall thickening, distention, or inflammatory changes. Vascular/Lymphatic: Minimal aortoiliac atherosclerosis (ICD10-170.0) without aneurysm. No abdominal or pelvic adenopathy. Reproductive: Multiple metallic seeds around the prostate. Other: No ascites.  No free air. Musculoskeletal: Innumerable small lytic lesions throughout all visualized bones. Mild T12 compression deformity, age indeterminate. IMPRESSION: 1. Innumerable lytic lesions involving all visualized bones suggesting metastatic disease or multiple myeloma. Electronically Signed   By: Lucrezia Europe M.D.   On: 03/01/2020 13:38   US RENAL  Result Date: 03/01/2020 CLINICAL DATA:  AKI, left flank pain for 1 week EXAM: RENAL / URINARY TRACT ULTRASOUND COMPLETE COMPARISON:  None. FINDINGS: Right Kidney: Renal measurements: 11.8 x 4.8 x 4.9 cm = volume: 144 mL . Echogenicity within normal limits. No mass or hydronephrosis visualized. Left Kidney: Renal measurements: 10.7 x 5.8 x 5.7 cm = volume: 187 mL. Echogenicity within normal limits. Exophytic cyst measuring 4.1 cm. No mass or hydronephrosis visualized. Bladder: Appears normal for degree of bladder distention. Other: None. IMPRESSION: No ultrasound  abnormality to explain left flank pain. No hydronephrosis. Exophytic cyst of the left kidney measuring 4 cm, in general unlikely to be symptomatic given modest size. Electronically Signed   By: Eddie Candle M.D.   On: 03/01/2020 16:43    Scheduled Meds: . amLODipine  5 mg Oral Daily  . dexamethasone (DECADRON) injection  20 mg Intravenous Q24H  . doxazosin  4 mg Oral QHS  . enoxaparin (LOVENOX) injection  40 mg Subcutaneous Q24H  . finasteride  5 mg Oral Daily  . pravastatin  40 mg Oral Daily  . white petrolatum       Continuous Infusions: . sodium chloride 125 mL/hr at 03/02/20 0000  . cefTRIAXone (ROCEPHIN)  IV Stopped (03/01/20 1756)     LOS: 1 day   Time spent: 34 minutes   Darliss Cheney, MD [facility]  03/02/2020, 9:44 AM   To contact the attending provider between 7A-7P or the covering provider during after hours 7P-7A, please log into the web site [email or web address].

## 2020-03-02 NOTE — Progress Notes (Signed)
Brief follow-up note:  Renal function and hypercalcemia improved with fliuds, steroids, pamidronate  Results for WEYLAND, VERVILLE (MRN [id number]) as of 03/02/2020 17:00  Ref. Range 01/11/2015 09:53 01/09/2016 10:45 03/01/2020 10:52 03/01/2020 16:10 03/02/2020 05:13  Creatinine Latest Ref Range: 0.61 - 1.24 mg/dL 1.1 1.0 1.84 (H) 1.55 (H) 1.53 (H)  Results for FILMORE, FINNEY (MRN [id number]) as of 03/02/2020 17:00  Ref. Range 01/15/2014 09:34 01/11/2015 09:53 01/09/2016 10:45 03/01/2020 10:52 03/02/2020 05:13  Calcium Latest Ref Range: 8.9 - 10.3 mg/dL 9.5 9.1 9.2 12.7 (H) 10.9 (H)   Total protein/ albumin still high, though improved with hydration: Results for GRASYN, COSSE (MRN [id number]) as of 03/02/2020 17:00  Ref. Range 01/15/2014 09:34 01/11/2015 09:53 01/09/2016 10:45 03/01/2020 10:52 03/02/2020 05:13  Total Protein Latest Ref Range: 6.5 - 8.1 g/dL 6.8 6.9 7.3 10.4 (H) 8.5 (H)  Results for SHADRICK, BIERLEY (MRN [id number]) as of 03/02/2020 17:00  Ref. Range 01/15/2014 09:34 01/11/2015 09:53 01/09/2016 10:45 03/01/2020 10:52 03/02/2020 05:13  Albumin Latest Ref Range: 3.5 - 5.0 g/dL 3.8 3.7 3.7 2.8 (L) 2.3 (L)   Working diagnosis still myeloma, though confirmatory labs still pending.  Tentatively have scheduled patient for follow up at the [facility] 03/11/2020  Will follow with you.

## 2020-03-02 NOTE — Progress Notes (Signed)
START OFF PATHWAY REGIMEN - Multiple Myeloma and Other Plasma Cell Dyscrasias   [id number]:Bortezomib (SQ) weekly + Dexamethasone weekly q35 days:   A cycle is every 35 days:     Bortezomib      Dexamethasone   **Always confirm dose/schedule in your pharmacy ordering system**  Patient Characteristics: Multiple Myeloma, Newly Diagnosed, Transplant Ineligible or Refused, Standard Risk Disease Classification: Multiple Myeloma R-ISS Staging: III Therapeutic Status: Newly Diagnosed Is Patient Eligible for Transplant<= Transplant Ineligible or Refused Risk Status: Standard Risk Intent of Therapy: Non-Curative / Palliative Intent, Discussed with Patient

## 2020-03-03 DIAGNOSIS — C9000 Multiple myeloma not having achieved remission: Principal | ICD-10-CM

## 2020-03-03 LAB — COMPREHENSIVE METABOLIC PANEL WITH GFR
ALT: 15 U/L (ref 0–44)
AST: 50 U/L — ABNORMAL HIGH (ref 15–41)
Albumin: 2.1 g/dL — ABNORMAL LOW (ref 3.5–5.0)
Alkaline Phosphatase: 36 U/L — ABNORMAL LOW (ref 38–126)
Anion gap: 6 (ref 5–15)
BUN: 38 mg/dL — ABNORMAL HIGH (ref 8–23)
CO2: 23 mmol/L (ref 22–32)
Calcium: 9.6 mg/dL (ref 8.9–10.3)
Chloride: 107 mmol/L (ref 98–111)
Creatinine, Ser: 1.45 mg/dL — ABNORMAL HIGH (ref 0.61–1.24)
GFR calc Af Amer: 49 mL/min — ABNORMAL LOW
GFR calc non Af Amer: 43 mL/min — ABNORMAL LOW
Glucose, Bld: 119 mg/dL — ABNORMAL HIGH (ref 70–99)
Potassium: 3.6 mmol/L (ref 3.5–5.1)
Sodium: 136 mmol/L (ref 135–145)
Total Bilirubin: 0.5 mg/dL (ref 0.3–1.2)
Total Protein: 7.7 g/dL (ref 6.5–8.1)

## 2020-03-03 LAB — CBC
HCT: 26.3 % — ABNORMAL LOW (ref 39.0–52.0)
Hemoglobin: 8.2 g/dL — ABNORMAL LOW (ref 13.0–17.0)
MCH: 25.3 pg — ABNORMAL LOW (ref 26.0–34.0)
MCHC: 31.2 g/dL (ref 30.0–36.0)
MCV: 81.2 fL (ref 80.0–100.0)
Platelets: 159 K/uL (ref 150–400)
RBC: 3.24 MIL/uL — ABNORMAL LOW (ref 4.22–5.81)
RDW: 13.1 % (ref 11.5–15.5)
WBC: 10.7 K/uL — ABNORMAL HIGH (ref 4.0–10.5)
nRBC: 0 % (ref 0.0–0.2)

## 2020-03-03 LAB — MAGNESIUM: Magnesium: 1.7 mg/dL (ref 1.7–2.4)

## 2020-03-03 LAB — FOLATE RBC
Folate, Hemolysate: 286 ng/mL
Folate, RBC: 1044 ng/mL (ref >498)
Hematocrit: 27.4 % — ABNORMAL LOW (ref 37.5–51.0)

## 2020-03-03 MED ORDER — BISACODYL 10 MG RE SUPP
10.0000 mg | Freq: Once | RECTAL | Status: AC
  Administered 2020-03-03: 10 mg via RECTAL
  Filled 2020-03-03: qty 1

## 2020-03-03 NOTE — Plan of Care (Signed)
Pt discharging to home. Discharge instructions explained to pt and pt verbalized understanding. Packed all personal belongings and DME wheelchair brought to the room. No further questions or concerns voiced. Awaiting transportation.   Problem: Education: Goal: Knowledge of General Education information will improve Description: Including pain rating scale, medication(s)/side effects and non-pharmacologic comfort measures Outcome: Completed/Met   Problem: Health Behavior/Discharge Planning: Goal: Ability to manage health-related needs will improve Outcome: Completed/Met   Problem: Clinical Measurements: Goal: Ability to maintain clinical measurements within normal limits will improve Outcome: Completed/Met Goal: Will remain free from infection Outcome: Completed/Met Goal: Diagnostic test results will improve Outcome: Completed/Met Goal: Respiratory complications will improve Outcome: Completed/Met Goal: Cardiovascular complication will be avoided Outcome: Completed/Met   Problem: Activity: Goal: Risk for activity intolerance will decrease Outcome: Completed/Met   Problem: Nutrition: Goal: Adequate nutrition will be maintained Outcome: Completed/Met   Problem: Coping: Goal: Level of anxiety will decrease Outcome: Completed/Met   Problem: Elimination: Goal: Will not experience complications related to bowel motility Outcome: Completed/Met Goal: Will not experience complications related to urinary retention Outcome: Completed/Met   Problem: Pain Managment: Goal: General experience of comfort will improve Outcome: Completed/Met   Problem: Safety: Goal: Ability to remain free from injury will improve Outcome: Completed/Met   Problem: Skin Integrity: Goal: Risk for impaired skin integrity will decrease Outcome: Completed/Met

## 2020-03-03 NOTE — Progress Notes (Signed)
Jen Eppinger   DOB:03-09-1929   KD#:[id number]   LZJ#:[id number]  Subjective:  Mr. Feggins is comfortable, pain well controlled.  He has had no breathing problems, nausea, confusion, or pruritus.  He has had no bowel movement however in about a week to his recollection.  No family in room.  He tolerated the pamidronate and Decadron with no unusual side effects.  His calcium is now 9.6, corrected 11.2; creatinine 1.45.  He remains anemic but denies symptoms related to this.  Objective: Elderly African-American man examined in bed Vitals:   03/03/20 0443 03/03/20 0751  BP: 131/78 (!) 144/71  Pulse: 67 66  Resp: 16 17  Temp: 98.3 F (36.8 C) 97.6 F (36.4 C)  SpO2: 98% 97%    Body mass index is 28.59 kg/m.  Intake/Output Summary (Last 24 hours) at 03/03/2020 0906 Last data filed at 03/03/2020 0311 Gross per 24 hour  Intake 720 ml  Output 300 ml  Net 420 ml     Lungs no rales or wheezes--auscultated anterolaterally  Heart regular rate and rhythm  Abdomen soft, +BS  Neuro nonfocal    CBG (last 3)  No results for input(s): GLUCAP in the last 72 hours.   Labs:  Lab Results  Component Value Date   WBC 10.7 (H) 03/03/2020   HGB 8.2 (L) 03/03/2020   HCT 26.3 (L) 03/03/2020   MCV 81.2 03/03/2020   PLT 159 03/03/2020   NEUTROABS 5.5 03/01/2020    '@LASTCHEMISTRY' @  Urine Studies No results for input(s): UHGB, CRYS in the last 72 hours.  Invalid input(s): UACOL, UAPR, USPG, UPH, UTP, UGL, UKET, UBIL, UNIT, UROB, ULEU, UEPI, UWBC, URBC, UBAC, CAST, [place], [place]  Basic Metabolic Panel: Recent Labs  Lab 03/01/20 1052 03/01/20 1052 03/01/20 1610 03/02/20 0513 03/03/20 0357  NA 133*  --   --  136 136  K 3.5   < >  --  4.0 3.6  CL 100  --   --  106 107  CO2 24  --   --  25 23  GLUCOSE 174*  --   --  143* 119*  BUN 26*  --   --  27* 38*  CREATININE 1.84*  --  1.55* 1.53* 1.45*  CALCIUM 12.7*  --   --  10.9* 9.6  MG  --   --  1.6*  --  1.7  PHOS  --   --  2.3*  --   --    <  > = values in this interval not displayed.   GFR Estimated Creatinine Clearance: 41 mL/min (A) (by C-G formula based on SCr of 1.45 mg/dL (H)). Liver Function Tests: Recent Labs  Lab 03/01/20 1052 03/02/20 0513 03/03/20 0357  AST 24 43* 50*  ALT '17 15 15  ' ALKPHOS 51 40 36*  BILITOT 0.7 0.5 0.5  PROT 10.4* 8.5* 7.7  ALBUMIN 2.8* 2.3* 2.1*   Recent Labs  Lab 03/01/20 1052  LIPASE 17   No results for input(s): AMMONIA in the last 168 hours. Coagulation profile No results for input(s): INR, PROTIME in the last 168 hours.  CBC: Recent Labs  Lab 03/01/20 1052 03/01/20 1610 03/02/20 0513 03/03/20 0357  WBC 6.4 7.2 9.0 10.7*  NEUTROABS 5.5  --   --   --   HGB 9.6* 8.7* 8.2* 8.2*  HCT 31.1* 28.9* 26.0* 26.3*  MCV 81.0 82.3 80.7 81.2  PLT 178 170 155 159   Cardiac Enzymes: No results for input(s): CKTOTAL, CKMB, CKMBINDEX, TROPONINI in  the last 168 hours. BNP: Invalid input(s): POCBNP CBG: No results for input(s): GLUCAP in the last 168 hours. D-Dimer No results for input(s): DDIMER in the last 72 hours. Hgb A1c No results for input(s): HGBA1C in the last 72 hours. Lipid Profile No results for input(s): CHOL, HDL, LDLCALC, TRIG, CHOLHDL, LDLDIRECT in the last 72 hours. Thyroid function studies No results for input(s): TSH, T4TOTAL, T3FREE, THYROIDAB in the last 72 hours.  Invalid input(s): FREET3 Anemia work up Recent Labs    03/01/20 1610  VITAMINB12 1,457*  FERRITIN 375*  TIBC 238*  IRON 49   Microbiology Recent Results (from the past 240 hour(s))  Urine culture     Status: Abnormal   Collection Time: 02/29/20  4:11 PM   Specimen: Urine, Random  Result Value Ref Range Status   Specimen Description URINE, RANDOM  Final   Special Requests NONE  Final   Culture (A)  Final    <10,000 COLONIES/mL INSIGNIFICANT GROWTH Performed at [facility], [address] [place], [place] [address]    Report Status 03/02/2020 FINAL  Final  Urine culture      Status: None   Collection Time: 03/01/20 10:40 AM   Specimen: Urine, Random  Result Value Ref Range Status   Specimen Description URINE, RANDOM  Final   Special Requests NONE  Final   Culture   Final    NO GROWTH Performed at [facility], [place] [address], [place], [place] [address]    Report Status 03/02/2020 FINAL  Final  SARS CORONAVIRUS 2 (TAT 6-24 HRS) Nasopharyngeal Nasopharyngeal Swab     Status: None   Collection Time: 03/01/20  3:29 PM   Specimen: Nasopharyngeal Swab  Result Value Ref Range Status   SARS Coronavirus 2 NEGATIVE NEGATIVE Final    Comment: (NOTE) SARS-CoV-2 target nucleic acids are NOT DETECTED. The SARS-CoV-2 RNA is generally detectable in upper and lower respiratory specimens during the acute phase of infection. Negative results do not preclude SARS-CoV-2 infection, do not rule out co-infections with other pathogens, and should not be used as the sole basis for treatment or other patient management decisions. Negative results must be combined with clinical observations, patient history, and epidemiological information. The expected result is Negative. Fact Sheet for Patients: SugarRoll.be Fact Sheet for Healthcare Providers: https://www.woods-mathews.com/ This test is not yet approved or cleared by the [place] FDA and  has been authorized for detection and/or diagnosis of SARS-CoV-2 by FDA under an Emergency Use Authorization (EUA). This EUA will remain  in effect (meaning this test can be used) for the duration of the COVID-19 declaration under Section 56 4(b)(1) of the Act, 21 U.S.C. section 360bbb-3(b)(1), unless the authorization is terminated or revoked sooner. Performed at [facility], [place] [address] [place],  [address]       Studies:  CT ABDOMEN PELVIS WO CONTRAST  Result Date: 03/01/2020 CLINICAL DATA:  Left abdominal pain, back pain x1 week EXAM: CT ABDOMEN AND PELVIS  WITHOUT CONTRAST TECHNIQUE: Multidetector CT imaging of the abdomen and pelvis was performed following the standard protocol without IV contrast. COMPARISON:  12/28/2003 FINDINGS: Lower chest: Coronary calcifications. No pleural or pericardial effusion. Hepatobiliary: No focal liver abnormality is seen. No gallstones, gallbladder wall thickening, or biliary dilatation. Pancreas: Unremarkable. No pancreatic ductal dilatation or surrounding inflammatory changes. Spleen: Normal in size without focal abnormality. Adrenals/Urinary Tract: 4.3 cm left upper pole renal cyst. No hydronephrosis. Adrenal glands unremarkable. Urinary bladder incompletely distended. Stomach/Bowel: Stomach  is within normal limits. Appendix not identified. No evidence of bowel wall thickening, distention, or inflammatory changes. Vascular/Lymphatic: Minimal aortoiliac atherosclerosis (ICD10-170.0) without aneurysm. No abdominal or pelvic adenopathy. Reproductive: Multiple metallic seeds around the prostate. Other: No ascites.  No free air. Musculoskeletal: Innumerable small lytic lesions throughout all visualized bones. Mild T12 compression deformity, age indeterminate. IMPRESSION: 1. Innumerable lytic lesions involving all visualized bones suggesting metastatic disease or multiple myeloma. Electronically Signed   By: Lucrezia Europe M.D.   On: 03/01/2020 13:38   US RENAL  Result Date: 03/01/2020 CLINICAL DATA:  AKI, left flank pain for 1 week EXAM: RENAL / URINARY TRACT ULTRASOUND COMPLETE COMPARISON:  None. FINDINGS: Right Kidney: Renal measurements: 11.8 x 4.8 x 4.9 cm = volume: 144 mL . Echogenicity within normal limits. No mass or hydronephrosis visualized. Left Kidney: Renal measurements: 10.7 x 5.8 x 5.7 cm = volume: 187 mL. Echogenicity within normal limits. Exophytic cyst measuring 4.1 cm. No mass or hydronephrosis visualized. Bladder: Appears normal for degree of bladder distention. Other: None. IMPRESSION: No ultrasound abnormality to  explain left flank pain. No hydronephrosis. Exophytic cyst of the left kidney measuring 4 cm, in general unlikely to be symptomatic given modest size. Electronically Signed   By: Eddie Candle M.D.   On: 03/01/2020 16:43    Assessment: 86 y.o. 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   (1) working diagnosis is myeloma:  myeloma panel, 24 hour urine for protein, creatinine and light chains, and kappa/lambda serum light chains pending. He will eventually need bone marrow biopsy  (a) bone marrow biopsy 03/11/2020  (2) emergent treatment aimed at assisting renal function and controlling serum Ca++: pamidronate (04/02), IVF, dexamethasone (04/02-01/2020)  (3) remission induction treatment as outpatient to start 03/11/2020   Plan:  Mr. Orlick has had a very good initial response to treatment, his creatinine continuing to drop, calcium continues to improve.  I have discussed the importance of his hydrating himself aggressively, the goal being 2 quarts of fluid daily.  He is constipated doubtless because of the narcotics.  He is on a bowel prophylaxis regimen.  I have added a laxative this morning.  I am comfortable with the plan for discharge today.  The patient knows how to contact us if any further problems develop.  He has follow-up visit with [place] 03/11/2020     Chauncey Cruel, MD 03/03/2020  9:06 AM Medical Oncology and Hematology [facility] [place], [place] [address] Tel. [phone]    Fax. [phone]

## 2020-03-03 NOTE — Discharge Summary (Signed)
Physician Discharge Summary  Joe [name] XBJ:[id number] DOB: 03/30/1929 DOA: 03/01/2020  PCP: Jani Gravel, MD  Admit date: 03/01/2020 Discharge date: 03/03/2020  Admitted From: Home Disposition: Home  Recommendations for Outpatient Follow-up:  1. Follow up with PCP in 1-2 weeks  2. Follow-up with oncology following Monday. 3. Please obtain BMP/CBC in one week 4. Please follow up on the following pending results:  Home Health: None Equipment/Devices: None  Discharge Condition: Stable CODE STATUS: DNR Diet recommendation: Cardiac  Subjective: Seen and examined.  Complains of back pain which is improved and only exacerbated with walking.  No other complaint.  Brief/Interim Summary: Joe [name] a 86 y.o.malewith medical history significant ofhypertension, hyperlipidemia, BPH presented to emergency department due to severe back pain which started a week before while he was moving some furniture and he felt a pop at that time although his back did not start hurting immediately.  The pain is progressive.  No history of trauma or fall. He went to urgent care and was prescribed Robaxin and prednisone.  No fever, chills.  Some dysuria but no other complaint. Has history of MGUS-used to see oncology Dr.Mohamed.  Last seen in 2017 and then lost to follow-up.   Upon arrival to ED: Patient's vital signs stable. He was afebrile with no leukocytosis, CMP shows sodium of 133, AKI, hypercalcemia of 12.7, elevated protein level of 10.4, lipase: WNL, H&H shows 9.6/31.1, CT abdomen/pelvis was obtained which was lactic lesions in bone concerning for multiple myeloma versus metastatic disease. Patient received IV fluids in ED. Heme oncologist consulted by EDP. [facility] consulted for admission for hypercalcemia and UTI.  Patient was started on Rocephin for possible UTI.  However urine culture grew less than 10,000 colonies of insignificant growth.  This is not consistent with UTI.  Patient  received 2 doses of Rocephin.  Does not need further antibiotics.  He also received 1 dose of pamidronate 60 mg IV and 2 doses of dexamethasone 20 mg daily per oncology recommendation.  Received some IV fluids for acute kidney injury.  Presenting creatinine was 1.84.  It improved to 1.4 today.  Good urine output.  Hypercalcemia with 10.4 at admission and normal today.  Further work-up was initiated by oncology which included light chains, kappa/lambda serum light chains which are all pending.  He will eventually need bone marrow biopsy which is scheduled on 03/11/2020.  He was seen by Dr. Magrinat/oncologist today.  Per my discussion with Dr. Jana Hakim, patient is cleared to go home.  Patient is feeling much better and is excited to go home.  No new medications.  He is advised to drink plenty of fluids to keep himself hydrated.  Although his creatinine has improved but it is still slightly elevated than his baseline.  Due to this, his fosinopril was held here and his blood pressure remained controlled.  I am holding/discontinuing his fosinopril.  He will see his PCP within a week.  I recommend repeating his BMP to check his renal function.  If renal function improves, fosinopril could be resumed if his blood pressure is elevated.  Discharge Diagnoses:  Principal Problem:   Back pain Active Problems:   Hypertension   Hypercalcemia   AKI (acute kidney injury) ([place])   Normocytic anemia   HLD (hyperlipidemia)   BPH (benign prostatic hyperplasia)   Elevated total protein   Hyponatremia    Discharge Instructions   Allergies as of 03/03/2020   No Known Allergies     Medication List  STOP taking these medications   fosinopril 40 MG tablet Commonly known as: MONOPRIL     TAKE these medications   acetaminophen 500 MG tablet Commonly known as: TYLENOL Take 1,000 mg by mouth every 6 (six) hours as needed for mild pain.   acyclovir 400 MG tablet Commonly known as: ZOVIRAX Take 1 tablet (400 mg  total) by mouth 2 (two) times daily.   amLODipine 5 MG tablet Commonly known as: NORVASC Take 5 mg by mouth daily.   azithromycin 250 MG tablet Commonly known as: ZITHROMAX Take 250-500 mg by mouth as directed. Take 2 tablets (524m) now, then 1 tablet (2577m once a day until gone.   doxazosin 4 MG tablet Commonly known as: CARDURA Take 4 mg by mouth at bedtime.   finasteride 5 MG tablet Commonly known as: PROSCAR Take 5 mg by mouth daily.   indapamide 2.5 MG tablet Commonly known as: LOZOL Take 2.5 mg by mouth daily.   methocarbamol 500 MG tablet Commonly known as: ROBAXIN Take 1 tablet (500 mg total) by mouth 2 (two) times daily.   pravastatin 40 MG tablet Commonly known as: PRAVACHOL Take 40 mg by mouth daily.   predniSONE 20 MG tablet Commonly known as: DELTASONE Take 1 tablet (20 mg total) by mouth daily with breakfast.   vitamin B-12 1000 MCG tablet Commonly known as: CYANOCOBALAMIN Take 1,000 mcg by mouth daily.            Durable Medical Equipment  (From admission, onward)         Start     Ordered   03/03/20 0742  For home use only DME Walker rolling  Once    Question Answer Comment  Walker: With 5 Inch Wheels   Patient needs a walker to treat with the following condition Intractable back pain      03/03/20 0742         Follow-up Information    KiJani GravelMD Follow up in 1 week(s).   Specialty: Internal Medicine Contact information: [address] [phone]        No Known Allergies  Consultations: Oncology   Procedures/Studies: CT ABDOMEN PELVIS WO CONTRAST  Result Date: 03/01/2020 CLINICAL DATA:  Left abdominal pain, back pain x1 week EXAM: CT ABDOMEN AND PELVIS WITHOUT CONTRAST TECHNIQUE: Multidetector CT imaging of the abdomen and pelvis was performed following the standard protocol without IV contrast. COMPARISON:  12/28/2003 FINDINGS: Lower chest: Coronary calcifications. No  pleural or pericardial effusion. Hepatobiliary: No focal liver abnormality is seen. No gallstones, gallbladder wall thickening, or biliary dilatation. Pancreas: Unremarkable. No pancreatic ductal dilatation or surrounding inflammatory changes. Spleen: Normal in size without focal abnormality. Adrenals/Urinary Tract: 4.3 cm left upper pole renal cyst. No hydronephrosis. Adrenal glands unremarkable. Urinary bladder incompletely distended. Stomach/Bowel: Stomach is within normal limits. Appendix not identified. No evidence of bowel wall thickening, distention, or inflammatory changes. Vascular/Lymphatic: Minimal aortoiliac atherosclerosis (ICD10-170.0) without aneurysm. No abdominal or pelvic adenopathy. Reproductive: Multiple metallic seeds around the prostate. Other: No ascites.  No free air. Musculoskeletal: Innumerable small lytic lesions throughout all visualized bones. Mild T12 compression deformity, age indeterminate. IMPRESSION: 1. Innumerable lytic lesions involving all visualized bones suggesting metastatic disease or multiple myeloma. Electronically Signed   By: D Lucrezia Europe.D.   On: 03/01/2020 13:38   [place]  Result Date: 03/01/2020 CLINICAL DATA:  AKI, left flank pain for 1 week EXAM: RENAL / URINARY TRACT ULTRASOUND COMPLETE COMPARISON:  None.  FINDINGS: Right Kidney: Renal measurements: 11.8 x 4.8 x 4.9 cm = volume: 144 mL . Echogenicity within normal limits. No mass or hydronephrosis visualized. Left Kidney: Renal measurements: 10.7 x 5.8 x 5.7 cm = volume: 187 mL. Echogenicity within normal limits. Exophytic cyst measuring 4.1 cm. No mass or hydronephrosis visualized. Bladder: Appears normal for degree of bladder distention. Other: None. IMPRESSION: No ultrasound abnormality to explain left flank pain. No hydronephrosis. Exophytic cyst of the left kidney measuring 4 cm, in general unlikely to be symptomatic given modest size. Electronically Signed   By: Eddie Candle M.D.   On: 03/01/2020 16:43       Discharge Exam: Vitals:   03/03/20 0443 03/03/20 0751  BP: 131/78 (!) 144/71  Pulse: 67 66  Resp: 16 17  Temp: 98.3 F (36.8 C) 97.6 F (36.4 C)  SpO2: 98% 97%   Vitals:   03/02/20 1510 03/02/20 1936 03/03/20 0443 03/03/20 0751  BP: 133/60 130/67 131/78 (!) 144/71  Pulse: 67 76 67 66  Resp: '18  16 17  ' Temp: 97.8 F (36.6 C) 98.6 F (37 C) 98.3 F (36.8 C) 97.6 F (36.4 C)  TempSrc: Oral Oral Oral Oral  SpO2: 97% 96% 98% 97%  Weight:      Height:        General: Pt is alert, awake, not in acute distress Cardiovascular: RRR, S1/S2 +, no rubs, no gallops Respiratory: CTA bilaterally, no wheezing, no rhonchi Abdominal: Soft, NT, ND, bowel sounds + Extremities: no edema, no cyanosis    The results of significant diagnostics from this hospitalization (including imaging, microbiology, ancillary and laboratory) are listed below for reference.     Microbiology: Recent Results (from the past 240 hour(s))  Urine culture     Status: Abnormal   Collection Time: 02/29/20  4:11 PM   Specimen: Urine, Random  Result Value Ref Range Status   Specimen Description URINE, RANDOM  Final   Special Requests NONE  Final   Culture (A)  Final    <10,000 COLONIES/mL INSIGNIFICANT GROWTH Performed at [facility], [address] [place], [place] [address]    Report Status 03/02/2020 FINAL  Final  Urine culture     Status: None   Collection Time: 03/01/20 10:40 AM   Specimen: Urine, Random  Result Value Ref Range Status   Specimen Description URINE, RANDOM  Final   Special Requests NONE  Final   Culture   Final    NO GROWTH Performed at [facility], [place] [address], [place], [place] [address]    Report Status 03/02/2020 FINAL  Final  SARS CORONAVIRUS 2 (TAT 6-24 HRS) Nasopharyngeal Nasopharyngeal Swab     Status: None   Collection Time: 03/01/20  3:29 PM   Specimen: Nasopharyngeal Swab  Result Value Ref Range Status   SARS Coronavirus 2 NEGATIVE NEGATIVE  Final    Comment: (NOTE) SARS-CoV-2 target nucleic acids are NOT DETECTED. The SARS-CoV-2 RNA is generally detectable in upper and lower respiratory specimens during the acute phase of infection. Negative results do not preclude SARS-CoV-2 infection, do not rule out co-infections with other pathogens, and should not be used as the sole basis for treatment or other patient management decisions. Negative results must be combined with clinical observations, patient history, and epidemiological information. The expected result is Negative. Fact Sheet for Patients: SugarRoll.be Fact Sheet for Healthcare Providers: https://www.woods-mathews.com/ This test is not yet approved or cleared by the [place] and  has been authorized  for detection and/or diagnosis of SARS-CoV-2 by FDA under an Emergency Use Authorization (EUA). This EUA will remain  in effect (meaning this test can be used) for the duration of the COVID-19 declaration under Section 56 4(b)(1) of the Act, 21 U.S.C. section 360bbb-3(b)(1), unless the authorization is terminated or revoked sooner. Performed at [facility], [place] [address], [place], [place] [address]      Labs: BNP (last 3 results) No results for input(s): BNP in the last 8760 hours. Basic Metabolic Panel: Recent Labs  Lab 03/01/20 1052 03/01/20 1610 03/02/20 0513 03/03/20 0357  NA 133*  --  136 136  K 3.5  --  4.0 3.6  CL 100  --  106 107  CO2 24  --  25 23  GLUCOSE 174*  --  143* 119*  BUN 26*  --  27* 38*  CREATININE 1.84* 1.55* 1.53* 1.45*  CALCIUM 12.7*  --  10.9* 9.6  MG  --  1.6*  --  1.7  PHOS  --  2.3*  --   --    Liver Function Tests: Recent Labs  Lab 03/01/20 1052 03/02/20 0513 03/03/20 0357  AST 24 43* 50*  ALT '17 15 15  ' ALKPHOS 51 40 36*  BILITOT 0.7 0.5 0.5  PROT 10.4* 8.5* 7.7  ALBUMIN 2.8* 2.3* 2.1*   Recent Labs  Lab 03/01/20 1052  LIPASE 17   No results for  input(s): AMMONIA in the last 168 hours. CBC: Recent Labs  Lab 03/01/20 1052 03/01/20 1610 03/02/20 0513 03/03/20 0357  WBC 6.4 7.2 9.0 10.7*  NEUTROABS 5.5  --   --   --   HGB 9.6* 8.7* 8.2* 8.2*  HCT 31.1* 28.9* 26.0* 26.3*  MCV 81.0 82.3 80.7 81.2  PLT 178 170 155 159   Cardiac Enzymes: No results for input(s): CKTOTAL, CKMB, CKMBINDEX, TROPONINI in the last 168 hours. BNP: Invalid input(s): POCBNP CBG: No results for input(s): GLUCAP in the last 168 hours. D-Dimer No results for input(s): DDIMER in the last 72 hours. Hgb A1c No results for input(s): HGBA1C in the last 72 hours. Lipid Profile No results for input(s): CHOL, HDL, LDLCALC, TRIG, CHOLHDL, LDLDIRECT in the last 72 hours. Thyroid function studies No results for input(s): TSH, T4TOTAL, T3FREE, THYROIDAB in the last 72 hours.  Invalid input(s): FREET3 Anemia work up Recent Labs    03/01/20 1610  VITAMINB12 1,457*  FERRITIN 375*  TIBC 238*  IRON 49   Urinalysis    Component Value Date/Time   COLORURINE AMBER (A) 03/01/2020 1052   APPEARANCEUR CLOUDY (A) 03/01/2020 1052   LABSPEC 1.021 03/01/2020 1052   PHURINE 5.0 03/01/2020 1052   GLUCOSEU NEGATIVE 03/01/2020 1052   HGBUR NEGATIVE 03/01/2020 1052   BILIRUBINUR NEGATIVE 03/01/2020 1052   KETONESUR NEGATIVE 03/01/2020 1052   PROTEINUR 100 (A) 03/01/2020 1052   UROBILINOGEN 0.2 02/29/2020 1537   NITRITE NEGATIVE 03/01/2020 1052   LEUKOCYTESUR NEGATIVE 03/01/2020 1052   Sepsis Labs Invalid input(s): PROCALCITONIN,  WBC,  LACTICIDVEN Microbiology Recent Results (from the past 240 hour(s))  Urine culture     Status: Abnormal   Collection Time: 02/29/20  4:11 PM   Specimen: Urine, Random  Result Value Ref Range Status   Specimen Description URINE, RANDOM  Final   Special Requests NONE  Final   Culture (A)  Final    <10,000 COLONIES/mL INSIGNIFICANT GROWTH Performed at [facility], [address] [place], [place] [address]    Report  Status 03/02/2020 FINAL  Final  Urine culture     Status: None   Collection Time: 03/01/20 10:40 AM   Specimen: Urine, Random  Result Value Ref Range Status   Specimen Description URINE, RANDOM  Final   Special Requests NONE  Final   Culture   Final    NO GROWTH Performed at [facility], [address], [place], [place] [address]    Report Status 03/02/2020 FINAL  Final  SARS CORONAVIRUS 2 (TAT 6-24 HRS) Nasopharyngeal Nasopharyngeal Swab     Status: None   Collection Time: 03/01/20  3:29 PM   Specimen: Nasopharyngeal Swab  Result Value Ref Range Status   SARS Coronavirus 2 NEGATIVE NEGATIVE Final    Comment: (NOTE) SARS-CoV-2 target nucleic acids are NOT DETECTED. The SARS-CoV-2 RNA is generally detectable in upper and lower respiratory specimens during the acute phase of infection. Negative results do not preclude SARS-CoV-2 infection, do not rule out co-infections with other pathogens, and should not be used as the sole basis for treatment or other patient management decisions. Negative results must be combined with clinical observations, patient history, and epidemiological information. The expected result is Negative. Fact Sheet for Patients: SugarRoll.be Fact Sheet for Healthcare Providers: https://www.woods-mathews.com/ This test is not yet approved or cleared by the [place] FDA and  has been authorized for detection and/or diagnosis of SARS-CoV-2 by FDA under an Emergency Use Authorization (EUA). This EUA will remain  in effect (meaning this test can be used) for the duration of the COVID-19 declaration under Section 56 4(b)(1) of the Act, 21 U.S.C. section 360bbb-3(b)(1), unless the authorization is terminated or revoked sooner. Performed at [facility], [place] [address] [place], [place] [address]      Time coordinating discharge: Over 30 minutes  SIGNED:   Darliss Cheney, MD  [facility] 03/03/2020, 9:15 AM  If 7PM-7AM, please contact night-coverage [email or web address]

## 2020-03-03 NOTE — Discharge Instructions (Signed)
Multiple Myeloma  Multiple myeloma is a form of cancer. It develops when abnormal plasma cells grow out of control. Plasma cells are a type of white blood cell that is made in the soft tissue inside the bones (bone marrow). They are part of the body's disease-fighting system (immune system). Multiple myeloma damages bones and causes other health problems because of its effect on blood cells. Abnormal plasma cells produce monoclonal proteins (M proteins) and interfere with many important functions that normal cells perform in the body. The disease gets worse over time (progresses) and reduces the body's ability to fight infections. What are the causes? The cause of multiple myeloma is not known. What increases the risk? You are more likely to develop this condition if you:  Are older than age 65.  Are male.  Are African American.  Have a family history of multiple myeloma.  Have a history of monoclonal gammopathy of undetermined significance (MGUS).  Have a history of radiation exposure.  Have been exposed to certain chemicals, such as benzene or pesticides. What are the signs or symptoms? Signs and symptoms of multiple myeloma may include:  Bone pain, especially in the back, ribs, and hips.  Broken bones (fractures).  Having a low level of red blood cells (anemia), white blood cells (leukopenia), and platelets (thrombocytopenia). Platelets are cells that help blood to clot so a wound does not keep bleeding.  Fatigue.  Weakness.  Infections.  Unusual bleeding, such as: ? Bleeding from the nose or gums. ? Bleeding a lot from a small scrape or cut.  High blood calcium levels.  Increased urination.  Confusion.  Shortness of breath.  Weakness or numbness in your legs.  Sudden, severe back pain. How is this diagnosed? This condition is diagnosed based on your symptoms, your medical history, and a physical exam. You will have blood and urine tests to confirm that M  proteins are present. You may also have other tests, including:  Additional blood tests.  X-rays.  MRI.  CT scan.  PET scan.  Tests to check the function of your kidneys.  Heart tests, such as an echocardiogram. An echocardiogram uses sound waves to produce an image of the heart.  A procedure to remove a sample of bone marrow (bone marrow biopsy). The sample is examined for abnormal plasma cells. How is this treated? There is no cure for multiple myeloma. However, treatments can manage symptoms and slow the progression of the disease. Treatment options may vary depending on how much the disease has advanced. Possible treatment options may include:  Medicines that kill cancer cells (chemotherapy).  Radiation therapy. This is the use of high-energy rays to kill cancer cells.  A bone marrow transplant. This procedure replaces diseased bone marrow with healthy bone marrow (stem cell transplant).  Medicines that block the growth and spread of cancer cells (targeted drug therapy).  Medicines that strengthen your immune system's ability to fight cancer cells (immunotherapy or biologic therapy).  Participating in clinical trials to find out if new (experimental) treatments are effective.  Medicines that help to prevent bone damage (bisphosphonates).  Medicines that reduce swelling (corticosteroids).  Surgery to repair bone damage.  A procedure to remove plasma cells from your blood (plasmapheresis).  Other medicines to treat problems such as infections or pain. Follow these instructions at home:  Eating and drinking  Drink enough fluid to keep your urine pale yellow.  Try to eat healthy meals on a regular basis. Some of your treatments might affect your   appetite. If you are having problems eating or if you do not have an appetite, meet with a diet and nutrition specialist (dietitian).  Take vitamins or supplements only as told by your health care provider or dietitian. Some  vitamins and supplements may interfere with how well your treatment works. General instructions  Take over-the-counter and prescription medicines only as told by your health care provider.  Stay active. Talk with your health care provider about what types of exercises and activities are safe for you. ? Avoid activities that cause increased pain. ? Do not lift anything that is heavier than 10 lb (4.5 kg), or the limit that you are told, until your health care provider says that it is safe.  Consider joining a support group or getting counseling to help you cope with the stress of having multiple myeloma.  Keep all follow-up visits as told by your health care provider. This is important. Where to find more information  American Cancer Society: www.cancer.org  Leukemia and Lymphoma Society: www.LLS.org  National Cancer Institute (NCI): www.cancer.gov Contact a health care provider if you:  Have pain that gets worse or does not get better with medicine.  Have a fever.  Have swollen legs.  Have weakness or dizziness.  Have unexplained weight loss.  Have unexplained bleeding or bruising.  Have a cough or symptoms of the common cold.  Feel depressed.  Have changes in urination or bowel movements. Get help right away if you:  Have sudden severe pain, especially back pain.  Have numbness or weakness in your arms, hands, legs, or feet.  Become very confused.  Have weakness on one side of your body.  Have slurred speech.  Have trouble staying awake.  Have shortness of breath.  Have blood in your stool (feces) or urine.  Vomit blood or cough up blood. Summary  Multiple myeloma is a form of cancer. It develops when abnormal plasma cells grow out of control.  There is no cure for multiple myeloma. However, treatments can manage symptoms and slow the progression of the disease. Treatment options may vary depending on how much the disease has advanced.  Do not lift  anything that is heavier than 10 lb (4.5 kg), or the limit that you are told, until your health care provider says that it is safe.  Contact your health care provider if you have any new symptoms or sudden severe pain, especially back pain. This information is not intended to replace advice given to you by your health care provider. Make sure you discuss any questions you have with your health care provider. Document Revised: 10/29/2017 Document Reviewed: 09/15/2017 Elsevier Patient Education  2020 Elsevier Inc.  

## 2020-03-03 NOTE — TOC Transition Note (Signed)
Transition of Care [facility]) - CM/SW Discharge Note   Patient Details  Name: Joe [name] MRN: [id number] Date of Birth: Dec 24, 1928  [facility]) CM/SW Contact:  Carles Collet, RN Phone Number: 03/03/2020, 9:37 AM   Clinical Narrative:   Spoke w patient, he confirmed need for RW. Requested delivery of RW to room prior to DC  through [employer]          Patient Goals and CMS Choice        Discharge Placement                       Discharge Plan and Services                DME Arranged: Walker rolling DME Agency: [facility] Date DME Agency Contacted: 03/03/20 Time DME Agency Contacted: [phone] Representative spoke with at DME Agency: [place] ([place]) Interventions     Readmission Risk Interventions No flowsheet data found.

## 2020-03-04 ENCOUNTER — Telehealth: Payer: Self-pay | Admitting: Oncology

## 2020-03-04 LAB — KAPPA/LAMBDA LIGHT CHAINS
Kappa free light chain: 690.6 mg/L — ABNORMAL HIGH (ref 3.3–19.4)
Kappa, lambda light chain ratio: 117.05 — ABNORMAL HIGH (ref 0.26–1.65)
Lambda free light chains: 5.9 mg/L (ref 5.7–26.3)

## 2020-03-04 NOTE — Telephone Encounter (Signed)
Called pt per 4/3 sch message - no answer - and unable to leave message. Called pt and wife . Sent message to RN to try to contact pt .

## 2020-03-05 ENCOUNTER — Telehealth: Payer: Self-pay | Admitting: *Deleted

## 2020-03-05 LAB — MULTIPLE MYELOMA PANEL, SERUM
Albumin SerPl Elph-Mcnc: 3.5 g/dL (ref 2.9–4.4)
Albumin/Glob SerPl: 0.6 — ABNORMAL LOW (ref 0.7–1.7)
Alpha 1: 0.4 g/dL (ref 0.0–0.4)
Alpha2 Glob SerPl Elph-Mcnc: 0.8 g/dL (ref 0.4–1.0)
B-Globulin SerPl Elph-Mcnc: 1 g/dL (ref 0.7–1.3)
Gamma Glob SerPl Elph-Mcnc: 4.2 g/dL — ABNORMAL HIGH (ref 0.4–1.8)
Globulin, Total: 6.3 g/dL — ABNORMAL HIGH (ref 2.2–3.9)
IgA: 62 mg/dL (ref 61–437)
IgG (Immunoglobin G), Serum: 5947 mg/dL — ABNORMAL HIGH (ref 603–1613)
IgM (Immunoglobulin M), Srm: 24 mg/dL (ref 15–143)
M Protein SerPl Elph-Mcnc: 3.8 g/dL — ABNORMAL HIGH
Total Protein ELP: 9.8 g/dL — ABNORMAL HIGH (ref 6.0–8.5)

## 2020-03-05 NOTE — Telephone Encounter (Signed)
This RN spoke with pt per his wife's VM stating pt is having severe back pain.  Pt states he is taking the tylenol but pain is still present.  Pain is the same as in the hospital- and waxes and wanes with movement.  Severity is worse " when you go to stretch it out - or try to get up "  This RN reviewed other medications he has for back pain per d/c sheet including the prednisone, methocarbamol,or acyclovir.  He states he has not had a good BM since being in the hospital - stating movements are small and watery.  He states he is passing gas.  He is not taking anything to assist with above.  This RN discussed the use of Miralax with the pt stating he thinks he has that in the home.  This RN informed pt - he pharmacy would be contacted to verify what medications were sent post discharge as well as review with Dr Jana Hakim above issues for further recommendations.  This RN verified pt's pharmacy and called and spoke with Gildardo Cranker pharmacist.  She states prescriptions for prednisone and methocarbamol were sent and is being shown as picked up.  She states presently pt has acyclovir ready for pick up.  This RN gave new prescriptions per MD recommedation for tramadol as well as to have available for wife to pick up pepcid and miralax.  MaryGrace will also inquire further about the prednisone and methocarbamol that is shown as picked up.  This RN called pt back and discussed the above as well as concern due to 2 medications that he may have in his home and is not aware.  This RN also inquired about who picks up his medications- and he states his wife.  This RN also asked about additional support with pt stating his son and daughter check in on him and his wife.  This RN reviewed new medications and how to use as well as this RN will call pt tomorrow for update on his status of pain and need for bowel movement.  Mr Kilbarger verbalized understanding and appreciation of above.  This note  will be forwarded to MD for review. This note will also be sent to Education officer, museum for alert and possible resources for patient and his wife due to new medical changes affecting their ability to maintain daily activities.  Yesterday when this RN spoke with pt he stated his wife has some memory issues. The patient is very alert and able to understand plan of care and instructions - he is limited due to new back pain interfering with ambulation.  Pt is scheduled to come in to the office on 4/12.

## 2020-03-05 NOTE — Progress Notes (Signed)
Pharmacist Chemotherapy Monitoring - Initial Assessment    Anticipated start date: 03/11/2020  Regimen:  . Are orders appropriate based on the patient's diagnosis, regimen, and cycle? Yes . Does the plan date match the patient's scheduled date? Yes . Is the sequencing of drugs appropriate? Yes . Are the premedications appropriate for the patient's regimen? Yes . Prior Authorization for treatment is: Approved o If applicable, is the correct biosimilar selected based on the patient's insurance? not applicable  Organ Function and Labs: [name] Are dose adjustments needed based on the patient's renal function, hepatic function, or hematologic function? Yes . Are appropriate labs ordered prior to the start of patient's treatment? Yes . Other organ system assessment, if indicated: N/A . The following baseline labs, if indicated, have been ordered: N/A  Dose Assessment: . Are the drug doses appropriate? Yes . Are the following correct: o Drug concentrations Yes o IV fluid compatible with drug Yes o Administration routes Yes o Timing of therapy Yes . If applicable, does the patient have documented access for treatment and/or plans for port-a-cath placement? not applicable . If applicable, have lifetime cumulative doses been properly documented and assessed? not applicable Lifetime Dose Tracking  No doses have been documented on this patient for the following tracked chemicals: Doxorubicin, Epirubicin, Idarubicin, Daunorubicin, Mitoxantrone, Bleomycin, Oxaliplatin, Carboplatin, Liposomal Doxorubicin  o   Toxicity Monitoring/Prevention: . The patient has the following take home antiemetics prescribed: N/A . The patient has the following take home medications prescribed: VZV prophylaxis . Medication allergies and previous infusion related reactions, if applicable, have been reviewed and addressed. Yes . The patient's current medication list has been assessed for drug-drug interactions with their  chemotherapy regimen. no significant drug-drug interactions were identified on review.  Order Review: . Are the treatment plan orders signed? Yes . Is the patient scheduled to see a provider prior to their treatment? Yes  I verify that I have reviewed each item in the above checklist and answered each question accordingly.  Joe [name] 03/05/2020 3:57 PM

## 2020-03-06 ENCOUNTER — Encounter: Payer: Self-pay | Admitting: Licensed Clinical Social Worker

## 2020-03-06 ENCOUNTER — Other Ambulatory Visit: Payer: Self-pay | Admitting: *Deleted

## 2020-03-06 MED ORDER — MAGNESIUM CITRATE PO SOLN: ORAL | 1 refills | Status: DC

## 2020-03-06 NOTE — Progress Notes (Signed)
[place] Work  Clinical Social Work was referred by Therapist, sports for assessment of needs related to new medical changes.  Clinical Social Worker contacted patient by phone  to offer support and assess for needs.  Patient reports he is doing well. He states only concern was getting used to the new medications and when to take them and which to take with or without food. He feels he understands this better after speaking with RN yesterday.   CSW provided information on other resources that this CSW can assist with and gave direct contact information should patient have any needs.      Colten Desroches, [place], [place] Worker [facility]

## 2020-03-07 ENCOUNTER — Other Ambulatory Visit: Payer: Self-pay | Admitting: *Deleted

## 2020-03-07 MED ORDER — TRAMADOL HCL 50 MG PO TABS: 50.0000 mg | ORAL_TABLET | Freq: Three times a day (TID) | ORAL | 0 refills | Status: DC | PRN

## 2020-03-08 ENCOUNTER — Telehealth: Payer: Self-pay | Admitting: *Deleted

## 2020-03-08 DIAGNOSIS — D472 Monoclonal gammopathy: Secondary | ICD-10-CM

## 2020-03-08 MED ORDER — FINASTERIDE 5 MG PO TABS: 5.0000 mg | ORAL_TABLET | Freq: Every day | ORAL | 3 refills | Status: DC

## 2020-03-08 NOTE — Telephone Encounter (Signed)
This RN spoke with pt per noted call to on call service as well as VM left this AM.  Joe [name] states he needs a refill on finasteride.  He also states " there are a couple of bottles of medications at the pharmacy but I didn't know what they are for "  This RN reviewed and discussed discharge medications with pt stating " I'm ok taking the medications just like to know why "  This RN also inquired about bowel movements per issues earlier this week with pt stating " after I took that last recommendation - I had a blow out "   He states bowels are now moving more normal- this RN did reiterate need to be aggressive with use of miralax if bowels are not moving well.  He states his pain is being management well with use of tramadol.  He also verbalized understanding of appointments for this coming Monday.  No further needs at this time.

## 2020-03-10 MED ORDER — PROCHLORPERAZINE MALEATE 10 MG PO TABS: 10.0000 mg | ORAL_TABLET | Freq: Four times a day (QID) | ORAL | 1 refills | Status: DC | PRN

## 2020-03-11 ENCOUNTER — Encounter: Payer: Self-pay | Admitting: Adult Health

## 2020-03-11 ENCOUNTER — Inpatient Hospital Stay: Payer: Medicare Other | Attending: Adult Health

## 2020-03-11 ENCOUNTER — Other Ambulatory Visit: Payer: Self-pay

## 2020-03-11 ENCOUNTER — Inpatient Hospital Stay (HOSPITAL_BASED_OUTPATIENT_CLINIC_OR_DEPARTMENT_OTHER): Payer: Medicare Other | Admitting: Adult Health

## 2020-03-11 ENCOUNTER — Inpatient Hospital Stay: Payer: Medicare Other

## 2020-03-11 VITALS — BP 156/72 | HR 83 | Temp 98.3°F | Resp 18

## 2020-03-11 VITALS — BP 152/75 | HR 76 | Temp 98.1°F | Resp 18

## 2020-03-11 DIAGNOSIS — C9000 Multiple myeloma not having achieved remission: Secondary | ICD-10-CM

## 2020-03-11 DIAGNOSIS — Z5112 Encounter for antineoplastic immunotherapy: Secondary | ICD-10-CM | POA: Insufficient documentation

## 2020-03-11 DIAGNOSIS — D649 Anemia, unspecified: Secondary | ICD-10-CM | POA: Diagnosis not present

## 2020-03-11 LAB — CBC WITH DIFFERENTIAL (CANCER CENTER ONLY)
Abs Immature Granulocytes: 0.03 10*3/uL (ref 0.00–0.07)
Basophils Absolute: 0 10*3/uL (ref 0.0–0.1)
Basophils Relative: 0 %
Eosinophils Absolute: 0.1 10*3/uL (ref 0.0–0.5)
Eosinophils Relative: 1 %
HCT: 30.1 % — ABNORMAL LOW (ref 39.0–52.0)
Hemoglobin: 9.4 g/dL — ABNORMAL LOW (ref 13.0–17.0)
Immature Granulocytes: 0 %
Lymphocytes Relative: 17 %
Lymphs Abs: 1.3 10*3/uL (ref 0.7–4.0)
MCH: 25.1 pg — ABNORMAL LOW (ref 26.0–34.0)
MCHC: 31.2 g/dL (ref 30.0–36.0)
MCV: 80.5 fL (ref 80.0–100.0)
Monocytes Absolute: 0.7 10*3/uL (ref 0.1–1.0)
Monocytes Relative: 9 %
Neutro Abs: 5.7 10*3/uL (ref 1.7–7.7)
Neutrophils Relative %: 73 %
Platelet Count: 233 10*3/uL (ref 150–400)
RBC: 3.74 MIL/uL — ABNORMAL LOW (ref 4.22–5.81)
RDW: 13.5 % (ref 11.5–15.5)
WBC Count: 7.8 10*3/uL (ref 4.0–10.5)
nRBC: 0 % (ref 0.0–0.2)

## 2020-03-11 LAB — CMP (CANCER CENTER ONLY)
ALT: 17 U/L (ref 0–44)
AST: 13 U/L — ABNORMAL LOW (ref 15–41)
Albumin: 2.7 g/dL — ABNORMAL LOW (ref 3.5–5.0)
Alkaline Phosphatase: 96 U/L (ref 38–126)
Anion gap: 6 (ref 5–15)
BUN: 19 mg/dL (ref 8–23)
CO2: 26 mmol/L (ref 22–32)
Calcium: 8.4 mg/dL — ABNORMAL LOW (ref 8.9–10.3)
Chloride: 103 mmol/L (ref 98–111)
Creatinine: 1.06 mg/dL (ref 0.61–1.24)
GFR, Est AFR Am: >60 mL/min (ref >60)
GFR, Estimated: >60 mL/min (ref >60)
Glucose, Bld: 92 mg/dL (ref 70–99)
Potassium: 3.4 mmol/L — ABNORMAL LOW (ref 3.5–5.1)
Sodium: 135 mmol/L (ref 135–145)
Total Bilirubin: 0.4 mg/dL (ref 0.3–1.2)
Total Protein: 8.5 g/dL — ABNORMAL HIGH (ref 6.5–8.1)

## 2020-03-11 MED ORDER — DEXAMETHASONE 4 MG PO TABS
40.0000 mg | ORAL_TABLET | Freq: Once | ORAL | Status: AC
  Administered 2020-03-11: 40 mg via ORAL

## 2020-03-11 MED ORDER — PROCHLORPERAZINE MALEATE 10 MG PO TABS
10.0000 mg | ORAL_TABLET | Freq: Once | ORAL | Status: AC
  Administered 2020-03-11: 10 mg via ORAL

## 2020-03-11 MED ORDER — SENNOSIDES-DOCUSATE SODIUM 8.6-50 MG PO TABS: 2.0000 | ORAL_TABLET | Freq: Two times a day (BID) | ORAL | 0 refills | Status: DC

## 2020-03-11 MED ORDER — BORTEZOMIB CHEMO SQ INJECTION 3.5 MG (2.5MG/ML)
1.3000 mg/m2 | Freq: Once | INTRAMUSCULAR | Status: AC
  Administered 2020-03-11: 2.75 mg via SUBCUTANEOUS
  Filled 2020-03-11: qty 1.1

## 2020-03-11 MED ORDER — TRAMADOL HCL 50 MG PO TABS: 50.0000 mg | ORAL_TABLET | Freq: Three times a day (TID) | ORAL | 0 refills | Status: DC | PRN

## 2020-03-11 MED ORDER — PROCHLORPERAZINE MALEATE 10 MG PO TABS
ORAL_TABLET | ORAL | Status: AC
  Filled 2020-03-11: qty 1

## 2020-03-11 MED ORDER — DEXAMETHASONE 4 MG PO TABS
ORAL_TABLET | ORAL | Status: AC
  Filled 2020-03-11: qty 10

## 2020-03-11 MED ORDER — POLYETHYLENE GLYCOL 3350 17 GM/SCOOP PO POWD: 1.0000 | Freq: Every day | ORAL | 0 refills | Status: DC

## 2020-03-11 NOTE — Progress Notes (Signed)
INDICATION: multiple myeloma  Brief examination was performed. ENT: adequate airway clearance Heart: regular rate and rhythm.No Murmurs Lungs: clear to auscultation, no wheezes, normal respiratory effort  Bone Marrow Biopsy and Aspiration Procedure Note   Informed consent was obtained and potential risks including bleeding, infection and pain were reviewed with the patient.  The patient's name, date of birth, identification, consent and allergies were verified prior to the start of procedure and time out was performed.  The left posterior iliac crest was chosen as the site of biopsy.  The skin was prepped with ChloraPrep.   12 cc of 2% lidocaine was used to provide local anaesthesia.   10 cc of bone marrow aspirate was obtained followed by 1cm biopsy.  Pressure was applied to the biopsy site and bandage was placed over the biopsy site. Patient was made to lie on the back for 15 mins prior to discharge.  The procedure was tolerated well. COMPLICATIONS: None BLOOD LOSS: none The patient was discharged home in stable condition with a 1 week follow up to review results.  Patient was provided with post bone marrow biopsy instructions and instructed to call if there was any bleeding or worsening pain.  Specimens sent for flow cytometry, cytogenetics and additional studies.  Signed Scot Dock, NP

## 2020-03-11 NOTE — Progress Notes (Signed)
Patient observed for 20 minutes post injection. VSS upon leaving unit.

## 2020-03-11 NOTE — Patient Instructions (Signed)
[facility] Discharge Instructions for Patients Receiving Chemotherapy  Today you received the following chemotherapy agents: velcade.  To help prevent nausea and vomiting after your treatment, we encourage you to take your nausea medication as prescribed by your physician.    If you develop nausea and vomiting that is not controlled by your nausea medication, call the clinic.   BELOW ARE SYMPTOMS THAT SHOULD BE REPORTED IMMEDIATELY:  *FEVER GREATER THAN 100.5 F  *CHILLS WITH OR WITHOUT FEVER  NAUSEA AND VOMITING THAT IS NOT CONTROLLED WITH YOUR NAUSEA MEDICATION  *UNUSUAL SHORTNESS OF BREATH  *UNUSUAL BRUISING OR BLEEDING  TENDERNESS IN MOUTH AND THROAT WITH OR WITHOUT PRESENCE OF ULCERS  *URINARY PROBLEMS  *BOWEL PROBLEMS  UNUSUAL RASH Items with * indicate a potential emergency and should be followed up as soon as possible.  Feel free to call the clinic should you have any questions or concerns. The clinic phone number is [phone].  Please show the [place] at check-in to the Emergency Department and triage nurse.  Bortezomib injection What is this medicine? BORTEZOMIB (bor TEZ oh mib) is a medicine that targets proteins in cancer cells and stops the cancer cells from growing. It is used to treat multiple myeloma and mantle-cell lymphoma. This medicine may be used for other purposes; ask your health care provider or pharmacist if you have questions. COMMON BRAND NAME(S): Velcade What should I tell my health care provider before I take this medicine? They need to know if you have any of these conditions:  diabetes  heart disease  irregular heartbeat  liver disease  on hemodialysis  low blood counts, like low white blood cells, platelets, or hemoglobin  peripheral neuropathy  taking medicine for blood pressure  an unusual or allergic reaction to bortezomib, mannitol, boron, other medicines, foods, dyes, or  preservatives  pregnant or trying to get pregnant  breast-feeding How should I use this medicine? This medicine is for injection into a vein or for injection under the skin. It is given by a health care professional in a hospital or clinic setting. Talk to your pediatrician regarding the use of this medicine in children. Special care may be needed. Overdosage: If you think you have taken too much of this medicine contact a poison control center or emergency room at once. NOTE: This medicine is only for you. Do not share this medicine with others. What if I miss a dose? It is important not to miss your dose. Call your doctor or health care professional if you are unable to keep an appointment. What may interact with this medicine? This medicine may interact with the following medications:  ketoconazole  rifampin  ritonavir  St. John's Wort This list may not describe all possible interactions. Give your health care provider a list of all the medicines, herbs, non-prescription drugs, or dietary supplements you use. Also tell them if you smoke, drink alcohol, or use illegal drugs. Some items may interact with your medicine. What should I watch for while using this medicine? You may get drowsy or dizzy. Do not drive, use machinery, or do anything that needs mental alertness until you know how this medicine affects you. Do not stand or sit up quickly, especially if you are an older patient. This reduces the risk of dizzy or fainting spells. In some cases, you may be given additional medicines to help with side effects. Follow all directions for their use. Call your doctor or health care professional for advice if  you get a fever, chills or sore throat, or other symptoms of a cold or flu. Do not treat yourself. This drug decreases your body's ability to fight infections. Try to avoid being around people who are sick. This medicine may increase your risk to bruise or bleed. Call your doctor or  health care professional if you notice any unusual bleeding. You may need blood work done while you are taking this medicine. In some patients, this medicine may cause a serious brain infection that may cause death. If you have any problems seeing, thinking, speaking, walking, or standing, tell your doctor right away. If you cannot reach your doctor, urgently seek other source of medical care. Check with your doctor or health care professional if you get an attack of severe diarrhea, nausea and vomiting, or if you sweat a lot. The loss of too much body fluid can make it dangerous for you to take this medicine. Do not become pregnant while taking this medicine or for at least 7 months after stopping it. Women should inform their doctor if they wish to become pregnant or think they might be pregnant. Men should not father a child while taking this medicine and for at least 4 months after stopping it. There is a potential for serious side effects to an unborn child. Talk to your health care professional or pharmacist for more information. Do not breast-feed an infant while taking this medicine or for 2 months after stopping it. This medicine may interfere with the ability to have a child. You should talk with your doctor or health care professional if you are concerned about your fertility. What side effects may I notice from receiving this medicine? Side effects that you should report to your doctor or health care professional as soon as possible:  allergic reactions like skin rash, itching or hives, swelling of the face, lips, or tongue  breathing problems  changes in hearing  changes in vision  fast, irregular heartbeat  feeling faint or lightheaded, falls  pain, tingling, numbness in the hands or feet  right upper belly pain  seizures  swelling of the ankles, feet, hands  unusual bleeding or bruising  unusually weak or tired  vomiting  yellowing of the eyes or skin Side effects  that usually do not require medical attention (report to your doctor or health care professional if they continue or are bothersome):  changes in emotions or moods  constipation  diarrhea  loss of appetite  headache  irritation at site where injected  nausea This list may not describe all possible side effects. Call your doctor for medical advice about side effects. You may report side effects to FDA at 1-800-FDA-1088. Where should I keep my medicine? This drug is given in a hospital or clinic and will not be stored at home. NOTE: This sheet is a summary. It may not cover all possible information. If you have questions about this medicine, talk to your doctor, pharmacist, or health care provider.  2020 Elsevier/Gold Standard (2018-03-28 16:29:31)

## 2020-03-11 NOTE — Patient Instructions (Addendum)
Take Senokot S one to two tablets twice a day Take Miralax daily.  Call us if no bowel movement in 2 days.    If no bowel movement after 3-4 days take 1/2 bottle magnesium citrate over ice, followed by 16 ounces of water.

## 2020-03-11 NOTE — Patient Instructions (Signed)
Bone Marrow Aspiration and Bone Marrow Biopsy, Adult, Care After This sheet gives you information about how to care for yourself after your procedure. Your health care provider may also give you more specific instructions. If you have problems or questions, contact your health care provider. What can I expect after the procedure? After the procedure, it is common to have:  Mild pain and tenderness.  Swelling.  Bruising. Follow these instructions at home: Puncture site care   Follow instructions from your health care provider about how to take care of the puncture site. Make sure you: ? Wash your hands with soap and water before and after you change your bandage (dressing). If soap and water are not available, use hand sanitizer. ? Change your dressing as told by your health care provider.  Check your puncture site every day for signs of infection. Check for: ? More redness, swelling, or pain. ? Fluid or blood. ? Warmth. ? Pus or a bad smell. Activity  Return to your normal activities as told by your health care provider. Ask your health care provider what activities are safe for you.  Do not lift anything that is heavier than 10 lb (4.5 kg), or the limit that you are told, until your health care provider says that it is safe.  Do not drive for 24 hours if you were given a sedative during your procedure. General instructions   Take over-the-counter and prescription medicines only as told by your health care provider.  Do not take baths, swim, or use a hot tub until your health care provider approves. Ask your health care provider if you may take showers. You may only be allowed to take sponge baths.  If directed, put ice on the affected area. To do this: ? Put ice in a plastic bag. ? Place a towel between your skin and the bag. ? Leave the ice on for 20 minutes, 2-3 times a day.  Keep all follow-up visits as told by your health care provider. This is important. Contact a  health care provider if:  Your pain is not controlled with medicine.  You have a fever.  You have more redness, swelling, or pain around the puncture site.  You have fluid or blood coming from the puncture site.  Your puncture site feels warm to the touch.  You have pus or a bad smell coming from the puncture site. Summary  After the procedure, it is common to have mild pain, tenderness, swelling, and bruising.  Follow instructions from your health care provider about how to take care of the puncture site and what activities are safe for you.  Take over-the-counter and prescription medicines only as told by your health care provider.  Contact a health care provider if you have any signs of infection, such as fluid or blood coming from the puncture site. This information is not intended to replace advice given to you by your health care provider. Make sure you discuss any questions you have with your health care provider. Document Revised: 04/04/2019 Document Reviewed: 04/04/2019 Elsevier Patient Education  2020 [employer]vier [employer].  

## 2020-03-11 NOTE — Progress Notes (Addendum)
[place]  [phone] [phone]) [phone]     ID: Joe [name] DOB: Jan 29, 1929  MR#: [id number]  ZDG#:[id number]  Patient Care Team: Jani Gravel, MD as PCP - General (Internal Medicine) Scot Dock, NP OTHER MD:  CHIEF COMPLAINT: multiple myeloma  CURRENT TREATMENT: Bortezomib, Dexamethasone   HISTORY OF CURRENT ILLNESS:  Joe [name] is an 86 y/o [place] man formerly followed by my partner Dr Julien Nordmann for M-Gus. We have an M-Spike of 0.87 documented 07/12/2012. [name] kappa/lambda light chains and total IgG were follower yearly until 01/2016, when they were 2.36 and 1517 respectively (unremarkable). He was lost to follow-up after that point.  Yesterday the patient presented to Urgent Care with poorly controlled pain. This was felt to be mussculoskeletal and he was started on a medrol dose-pak and robaxin. However his pain worsened and he presented to the ED at [facility] where vitals and exam were unremarkable but labs showed a creatinine of 1.84 (baseline 1.0), calcium 12.7 with albumin 2.8 and total protein 10.4 (globulin fraction 7.8). CT of the abdomen obtained for evaluation of his abdominal and back pain showed no hydronephrosis but multiple lytic lesons. We were consulted for further evaluation and treatment of likely multiple myeloma and he was admitted to the hospital.    The patient's subsequent history is as detailed below.   INTERVAL HISTORY: Joe [name] is doing moderately well today.  I performed his bone marrow biopsy today, please see the separate procedure note about this.  I reviewed his hospitalization from 03/01/2020 through 03/03/2020.  He notes that he started experiencing pain in his back and went to ER.  He was discharged from the [facility] on 03/03/2020.   He did have hypercalcemia and received IV hydration and Pamidronate for this.     He is taking tramadol about twice a day for his back pain.  He is requesting a refill of  this today, as he is running low on his pills.  He says it is controlling his pain, and he has been able to move around and remain functional with the medication. He last received this about 10 days ago.    He is due to start weekly Bortezomib today. He has started the Acyclovir.  He will receive the Dexamethasone today with his Bortezomib.    REVIEW OF SYSTEMS: Joe [name] is constipated.  He notes that his last bowel movement was about a week ago and he feels ucomfortable.  He has been taking milk of magnesia for this but is requesting assistance with his bowels.  He denies any nausea, or vomiting.  He notes his appetite is stable, and he is drinking 2 quarts of water per day.    He denies any fever, chills, chest pain, palpitations, cough, shortness of breath, bladder issues, focal weakness, numbness or tinging, headaches, vision issues, or other concerns.  A detailed ROS was otherwise non contributory.       PAST MEDICAL HISTORY: Past Medical History:  Diagnosis Date  . Enlarged prostate   . Hypertension     PAST SURGICAL HISTORY: Past Surgical History:  Procedure Laterality Date  . EYE SURGERY    . PROSTATE SURGERY      FAMILY HISTORY Family History  Problem Relation Age of Onset  . Hypertension Mother          SOCIAL HISTORY: Retired, used to work for the [employer] Lives with wife [place]. Has 4 children, 2 in [place], one in [place], one in [place];  5 grandchildren and 5 great-grandchildren. Attands a [employer].     ADVANCED DIRECTIVES:  at the initial visit confirmed the patient's wife is his HCPOA  HEALTH MAINTENANCE: Social History   Tobacco Use  . Smoking status: Never Smoker  . Smokeless tobacco: Never Used  Substance Use Topics  . Alcohol use: Never  . Drug use: Never     Colonoscopy: 6-8 years ago     No Known Allergies  Current Outpatient Medications  Medication Sig Dispense Refill  . acetaminophen (TYLENOL) 500 MG  tablet Take 1,000 mg by mouth every 6 (six) hours as needed for mild pain.    [name] acyclovir (ZOVIRAX) 400 MG tablet Take 1 tablet (400 mg total) by mouth 2 (two) times daily. 60 tablet 3  . amLODipine (NORVASC) 5 MG tablet Take 5 mg by mouth daily.    [name] azithromycin (ZITHROMAX) 250 MG tablet Take 250-500 mg by mouth as directed. Take 2 tablets ('500mg'$ ) now, then 1 tablet ('250mg'$ ) once a day until gone.    . doxazosin (CARDURA) 4 MG tablet Take 4 mg by mouth at bedtime.    . finasteride (PROSCAR) 5 MG tablet Take 1 tablet (5 mg total) by mouth daily. 30 tablet 3  . indapamide (LOZOL) 2.5 MG tablet Take 2.5 mg by mouth daily.     . magnesium citrate SOLN Take 1/2 bottle if no bowel movement in 2 hours repeat 195 mL 1  . methocarbamol (ROBAXIN) 500 MG tablet Take 1 tablet (500 mg total) by mouth 2 (two) times daily. 30 tablet 0  . pravastatin (PRAVACHOL) 40 MG tablet Take 40 mg by mouth daily.    . predniSONE (DELTASONE) 20 MG tablet Take 1 tablet (20 mg total) by mouth daily with breakfast. 7 tablet 0  . prochlorperazine (COMPAZINE) 10 MG tablet Take 1 tablet (10 mg total) by mouth every 6 (six) hours as needed (Nausea or vomiting). 30 tablet 1  . traMADol (ULTRAM) 50 MG tablet Take 1 tablet (50 mg total) by mouth every 8 (eight) hours as needed for severe pain. 30 tablet 0  . vitamin B-12 (CYANOCOBALAMIN) 1000 MCG tablet Take 1,000 mcg by mouth daily.     No current facility-administered medications for this visit.    OBJECTIVE: Seen in infusion room.  See [facility] for vitals, which were stable.    There were no vitals filed for this visit.   There is no height or weight on file to calculate BMI.   Wt Readings from Last 3 Encounters:  03/01/20 205 lb (93 kg)  02/29/20 205 lb (93 kg)  01/16/16 218 lb 11.2 oz (99.2 kg)      ECOG FS:2 - Symptomatic, <50% confined to bed  GENERAL: Patient is a well appearing male in no acute distress HEENT:  Sclerae anicteric.  Mask in place. Neck is supple.    NODES:  No cervical, supraclavicular lymphadenopathy palpated.  LUNGS:  Clear to auscultation bilaterally.  No wheezes or rhonchi. HEART:  Regular rate and rhythm. No murmur appreciated. ABDOMEN:  Soft, nontender.  Positive, normoactive bowel sounds. No organomegaly palpated. MSK:  No focal spinal tenderness to palpation. EXTREMITIES:  No peripheral edema.   SKIN:  Clear with no obvious rashes or skin changes. No nail dyscrasia. NEURO:  Nonfocal. Well oriented.  Appropriate affect.     LAB RESULTS:  CMP     Component Value Date/Time   NA 136 03/03/2020 0357   NA 140 01/09/2016 1045   K 3.6 03/03/2020  0357   K 3.8 01/09/2016 1045   CL 107 03/03/2020 0357   CO2 23 03/03/2020 0357   CO2 27 01/09/2016 1045   GLUCOSE 119 (H) 03/03/2020 0357   GLUCOSE 89 01/09/2016 1045   BUN 38 (H) 03/03/2020 0357   BUN 14.7 01/09/2016 1045   CREATININE 1.45 (H) 03/03/2020 0357   CREATININE 1.0 01/09/2016 1045   CALCIUM 9.6 03/03/2020 0357   CALCIUM 9.2 01/09/2016 1045   PROT 7.7 03/03/2020 0357   PROT 7.3 01/09/2016 1045   ALBUMIN 2.1 (L) 03/03/2020 0357   ALBUMIN 3.7 01/09/2016 1045   AST 50 (H) 03/03/2020 0357   AST 18 01/09/2016 1045   ALT 15 03/03/2020 0357   ALT 15 01/09/2016 1045   ALKPHOS 36 (L) 03/03/2020 0357   ALKPHOS 60 01/09/2016 1045   BILITOT 0.5 03/03/2020 0357   BILITOT 0.51 01/09/2016 1045   GFRNONAA 43 (L) 03/03/2020 0357   GFRAA 49 (L) 03/03/2020 0357    Lab Results  Component Value Date   TOTALPROTELP 9.8 (H) 03/01/2020   ALBUMINELP 56.1 07/12/2012   A1GS 3.5 07/12/2012   A2GS 9.8 07/12/2012   BETS 5.1 07/12/2012   BETA2SER 3.6 07/12/2012   GAMS 21.9 (H) 07/12/2012   MSPIKE 0.87 07/12/2012   SPEI * 07/12/2012    Lab Results  Component Value Date   KPAFRELGTCHN 690.6 (H) 03/01/2020   LAMBDASER 5.9 03/01/2020   KAPLAMBRATIO 117.05 (H) 03/01/2020    Lab Results  Component Value Date   WBC 10.7 (H) 03/03/2020   NEUTROABS 5.5 03/01/2020   HGB 8.2  (L) 03/03/2020   HCT 26.3 (L) 03/03/2020   MCV 81.2 03/03/2020   PLT 159 03/03/2020      Chemistry      Component Value Date/Time   NA 136 03/03/2020 0357   NA 140 01/09/2016 1045   K 3.6 03/03/2020 0357   K 3.8 01/09/2016 1045   CL 107 03/03/2020 0357   CO2 23 03/03/2020 0357   CO2 27 01/09/2016 1045   BUN 38 (H) 03/03/2020 0357   BUN 14.7 01/09/2016 1045   CREATININE 1.45 (H) 03/03/2020 0357   CREATININE 1.0 01/09/2016 1045      Component Value Date/Time   CALCIUM 9.6 03/03/2020 0357   CALCIUM 9.2 01/09/2016 1045   ALKPHOS 36 (L) 03/03/2020 0357   ALKPHOS 60 01/09/2016 1045   AST 50 (H) 03/03/2020 0357   AST 18 01/09/2016 1045   ALT 15 03/03/2020 0357   ALT 15 01/09/2016 1045   BILITOT 0.5 03/03/2020 0357   BILITOT 0.51 01/09/2016 1045       No results found for: LABCA2  No components found for: ZOXWRU045  No results for input(s): INR in the last 168 hours.  No results found for: LABCA2  No results found for: WUJ811  No results found for: BJY782  No results found for: NFA213  No results found for: CA2729  No components found for: HGQUANT  No results found for: CEA1 / No results found for: CEA1   No results found for: AFPTUMOR  No results found for: CHROMOGRNA  No results found for: PSA1  No visits with results within 3 Day(s) from this visit.  Latest known visit with results is:  Admission on 03/01/2020, Discharged on 03/03/2020  Component Date Value Ref Range Status  . Sodium 03/01/2020 133* 135 - 145 mmol/L Final  . Potassium 03/01/2020 3.5  3.5 - 5.1 mmol/L Final  . Chloride 03/01/2020 100  98 - 111 mmol/L  Final  . CO2 03/01/2020 24  22 - 32 mmol/L Final  . Glucose, Bld 03/01/2020 174* 70 - 99 mg/dL Final   Glucose reference range applies only to samples taken after fasting for at least 8 hours.  . BUN 03/01/2020 26* 8 - 23 mg/dL Final  . Creatinine, Ser 03/01/2020 1.84* 0.61 - 1.24 mg/dL Final  . Calcium 03/01/2020 12.7* 8.9 - 10.3 mg/dL  Final  . Total Protein 03/01/2020 10.4* 6.5 - 8.1 g/dL Final  . Albumin 03/01/2020 2.8* 3.5 - 5.0 g/dL Final  . AST 03/01/2020 24  15 - 41 U/L Final  . ALT 03/01/2020 17  0 - 44 U/L Final  . Alkaline Phosphatase 03/01/2020 51  38 - 126 U/L Final  . Total Bilirubin 03/01/2020 0.7  0.3 - 1.2 mg/dL Final  . GFR calc non Af Amer 03/01/2020 32* >60 mL/min Final  . GFR calc Af Amer 03/01/2020 37* >60 mL/min Final  . Anion gap 03/01/2020 9  5 - 15 Final   Performed at [facility], [place] [address] [place], [place] [address]  . Lipase 03/01/2020 17  11 - 51 U/L Final   Performed at [facility], [place] [address] [place], [place] [address]  . WBC 03/01/2020 6.4  4.0 - 10.5 K/uL Final  . RBC 03/01/2020 3.84* 4.22 - 5.81 MIL/uL Final  . Hemoglobin 03/01/2020 9.6* 13.0 - 17.0 g/dL Final  . HCT 03/01/2020 31.1* 39.0 - 52.0 % Final  . MCV 03/01/2020 81.0  80.0 - 100.0 fL Final  . MCH 03/01/2020 25.0* 26.0 - 34.0 pg Final  . MCHC 03/01/2020 30.9  30.0 - 36.0 g/dL Final  . RDW 03/01/2020 13.0  11.5 - 15.5 % Final  . Platelets 03/01/2020 178  150 - 400 K/uL Final  . nRBC 03/01/2020 0.0  0.0 - 0.2 % Final  . Neutrophils Relative % 03/01/2020 85  % Final  . Neutro Abs 03/01/2020 5.5  1.7 - 7.7 K/uL Final  . Lymphocytes Relative 03/01/2020 14  % Final  . Lymphs Abs 03/01/2020 0.9  0.7 - 4.0 K/uL Final  . Monocytes Relative 03/01/2020 1  % Final  . Monocytes Absolute 03/01/2020 0.0* 0.1 - 1.0 K/uL Final  . Eosinophils Relative 03/01/2020 0  % Final  . Eosinophils Absolute 03/01/2020 0.0  0.0 - 0.5 K/uL Final  . Basophils Relative 03/01/2020 0  % Final  . Basophils Absolute 03/01/2020 0.0  0.0 - 0.1 K/uL Final  . Immature Granulocytes 03/01/2020 0  % Final  . Abs Immature Granulocytes 03/01/2020 0.02  0.00 - 0.07 K/uL Final   Performed at [facility], [place] [address] [place], [place] [address]  . Color, Urine 03/01/2020 AMBER* YELLOW Final   BIOCHEMICALS MAY BE AFFECTED BY COLOR  .  APPearance 03/01/2020 CLOUDY* CLEAR Final  . Specific Gravity, Urine 03/01/2020 1.021  1.005 - 1.030 Final  . pH 03/01/2020 5.0  5.0 - 8.0 Final  . Glucose, UA 03/01/2020 NEGATIVE  NEGATIVE mg/dL Final  . Hgb urine dipstick 03/01/2020 NEGATIVE  NEGATIVE Final  . Bilirubin Urine 03/01/2020 NEGATIVE  NEGATIVE Final  . Ketones, ur 03/01/2020 NEGATIVE  NEGATIVE mg/dL Final  . Protein, ur 03/01/2020 100* NEGATIVE mg/dL Final  . Nitrite 03/01/2020 NEGATIVE  NEGATIVE Final  . Chalmers Guest 03/01/2020 NEGATIVE  NEGATIVE Final  . RBC / HPF 03/01/2020 0-5  0 - 5 RBC/hpf Final  . WBC, UA 03/01/2020 0-5  0 - 5 WBC/hpf Final  . Bacteria,  UA 03/01/2020 FEW* NONE SEEN Final  . Squamous Epithelial / LPF 03/01/2020 0-5  0 - 5 Final  . Mucus 03/01/2020 PRESENT   Final  . Hyaline Casts, UA 03/01/2020 PRESENT   Final  . Amorphous Crystal 03/01/2020 PRESENT   Final   Performed at [facility], [place] [address], [place], [place] [address]  . SARS Coronavirus 2 03/01/2020 NEGATIVE  NEGATIVE Final   Comment: (NOTE) SARS-CoV-2 target nucleic acids are NOT DETECTED. The SARS-CoV-2 RNA is generally detectable in upper and lower respiratory specimens during the acute phase of infection. Negative results do not preclude SARS-CoV-2 infection, do not rule out co-infections with other pathogens, and should not be used as the sole basis for treatment or other patient management decisions. Negative results must be combined with clinical observations, patient history, and epidemiological information. The expected result is Negative. Fact Sheet for Patients: SugarRoll.be Fact Sheet for Healthcare Providers: https://www.woods-mathews.com/ This test is not yet approved or cleared by the [place] FDA and  has been authorized for detection and/or diagnosis of SARS-CoV-2 by FDA under an Emergency Use Authorization (EUA). This EUA will remain  in effect (meaning this test  can be used) for the duration of the COVID-19 declaration under Section 56                          4(b)(1) of the Act, 21 U.S.C. section 360bbb-3(b)(1), unless the authorization is terminated or revoked sooner. Performed at [facility], [place] [address], [place], [place] [address]   . CRP 03/01/2020 0.6  <1.0 mg/dL Final   Performed at [facility] [address], [place], [place] [address]  . Sed Rate 03/01/2020 98* 0 - 16 mm/hr Final   Performed at [facility], [place] [address], [place], [place] [address]  . Calcium, Ionized, Serum 03/01/2020 7.1* 4.5 - 5.6 mg/dL Final   Comment: (NOTE) **Results verified by repeat testing** Performed At: [facility] [place], [place] [id number] Rush Farmer MD [id number]   . WBC 03/01/2020 7.2  4.0 - 10.5 K/uL Final  . RBC 03/01/2020 3.51* 4.22 - 5.81 MIL/uL Final  . Hemoglobin 03/01/2020 8.7* 13.0 - 17.0 g/dL Final  . HCT 03/01/2020 28.9* 39.0 - 52.0 % Final  . MCV 03/01/2020 82.3  80.0 - 100.0 fL Final  . MCH 03/01/2020 24.8* 26.0 - 34.0 pg Final  . MCHC 03/01/2020 30.1  30.0 - 36.0 g/dL Final  . RDW 03/01/2020 13.1  11.5 - 15.5 % Final  . Platelets 03/01/2020 170  150 - 400 K/uL Final  . nRBC 03/01/2020 0.0  0.0 - 0.2 % Final   Performed at [facility] [address], [place], [name] [address]  . Creatinine, Ser 03/01/2020 1.55* 0.61 - 1.24 mg/dL Final  . GFR calc non Af Amer 03/01/2020 39* >60 mL/min Final  . GFR calc Af Amer 03/01/2020 46* >60 mL/min Final   Performed at [facility], [place] [address], [place], [place] [address]  . Magnesium 03/01/2020 1.6* 1.7 - 2.4 mg/dL Final   Performed at [facility] [address], [place], [place] [address]  . Phosphorus 03/01/2020 2.3* 2.5 - 4.6 mg/dL Final   Performed at [facility] [address] [place], [place] [address]  . Specimen Description 03/01/2020 URINE, RANDOM   Final  . Special Requests 03/01/2020 NONE    Final  .  Culture 03/01/2020    Final                   Value:NO GROWTH Performed at [facility], [place] [address] [place], [place] [address]   . Report Status 03/01/2020 03/02/2020 FINAL   Final  . Iron 03/01/2020 49  45 - 182 ug/dL Final  . TIBC 03/01/2020 238* 250 - 450 ug/dL Final  . Saturation Ratios 03/01/2020 21  17.9 - 39.5 % Final  . UIBC 03/01/2020 189  ug/dL Final   Performed at [facility], [place] [address] [place], [place] [address]  . Ferritin 03/01/2020 375* 24 - 336 ng/mL Final   Performed at [facility], [place] [address], [place], [place] [address]  . Vitamin B-12 03/01/2020 1,457* 180 - 914 pg/mL Final   Comment: (NOTE) This assay is not validated for testing neonatal or myeloproliferative syndrome specimens for Vitamin B12 levels. Performed at [facility], [place] [address], [place], [place] [address]   . Folate, Hemolysate 03/01/2020 286.0  Not Estab. ng/mL Final  . Hematocrit 03/01/2020 27.4* 37.5 - 51.0 % Final  . Folate, RBC 03/01/2020 1,044  >498 ng/mL Final   Comment: (NOTE) Performed At: [facility] [place], [place] [id number] Rush Farmer MD [id number]   . IgG (Immunoglobin G), Serum 03/01/2020 5,947* 603 - 1,613 mg/dL Final   Comment: (NOTE) Results confirmed on dilution.   . IgA 03/01/2020 62  61 - 437 mg/dL Final  . IgM (Immunoglobulin M), Srm 03/01/2020 24  15 - 143 mg/dL Final   Result confirmed on concentration.  . Total Protein ELP 03/01/2020 9.8* 6.0 - 8.5 g/dL Corrected  . Albumin SerPl Elph-Mcnc 03/01/2020 3.5  2.9 - 4.4 g/dL Corrected  . Alpha 1 03/01/2020 0.4  0.0 - 0.4 g/dL Corrected  . Alpha2 Glob SerPl Elph-Mcnc 03/01/2020 0.8  0.4 - 1.0 g/dL Corrected  . B-Globulin SerPl Elph-Mcnc 03/01/2020 1.0  0.7 - 1.3 g/dL Corrected  . Gamma Glob SerPl Elph-Mcnc 03/01/2020 4.2* 0.4 - 1.8 g/dL Corrected  . M Protein SerPl Elph-Mcnc 03/01/2020 3.8* Not Observed g/dL Corrected  . Globulin, Total  03/01/2020 6.3* 2.2 - 3.9 g/dL Corrected  . Albumin/Glob SerPl 03/01/2020 0.6* 0.7 - 1.7 Corrected  . IFE 1 03/01/2020 Comment*  Corrected   Comment: (NOTE) Immunofixation shows IgG monoclonal protein with kappa light chain specificity. Please note that samples from patients receiving DARZALEX(R) (daratumumab) treatment can appear as an "IgG kappa" and mask a complete response. If this patient is receiving DARA, this IFE assay interference can be removed by ordering test number [id number]-"Immunofixation, Daratumumab-Specific, Serum" and submitting a new sample for testing or by calling the lab to add this test to the current sample.   . Please Note 03/01/2020 Comment   Corrected   Comment: (NOTE) Protein electrophoresis scan will follow via computer, mail, or courier delivery. Performed At: [facility] [place], [place] [id number] Rush Farmer MD [id number]   . Kappa free light chain 03/01/2020 690.6* 3.3 - 19.4 mg/L Final  . Lamda free light chains 03/01/2020 5.9  5.7 - 26.3 mg/L Final  . Kappa, lamda light chain ratio 03/01/2020 117.05* 0.26 - 1.65 Final   Comment: (NOTE) Performed At: [facility] [place], [place] [id number] Rush Farmer MD [id number]   . Sodium 03/02/2020 136  135 - 145 mmol/L Final  . Potassium 03/02/2020 4.0  3.5 - 5.1 mmol/L Final  . Chloride  03/02/2020 106  98 - 111 mmol/L Final  . CO2 03/02/2020 25  22 - 32 mmol/L Final  . Glucose, Bld 03/02/2020 143* 70 - 99 mg/dL Final   Glucose reference range applies only to samples taken after fasting for at least 8 hours.  . BUN 03/02/2020 27* 8 - 23 mg/dL Final  . Creatinine, Ser 03/02/2020 1.53* 0.61 - 1.24 mg/dL Final  . Calcium 03/02/2020 10.9* 8.9 - 10.3 mg/dL Final  . Total Protein 03/02/2020 8.5* 6.5 - 8.1 g/dL Final  . Albumin 03/02/2020 2.3* 3.5 - 5.0 g/dL Final  . AST 03/02/2020 43* 15 - 41 U/L Final  . ALT 03/02/2020 15  0 - 44 U/L Final  .  Alkaline Phosphatase 03/02/2020 40  38 - 126 U/L Final  . Total Bilirubin 03/02/2020 0.5  0.3 - 1.2 mg/dL Final  . GFR calc non Af Amer 03/02/2020 40* >60 mL/min Final  . GFR calc Af Amer 03/02/2020 46* >60 mL/min Final  . Anion gap 03/02/2020 5  5 - 15 Final   Performed at [facility], [place] [address], [place], [place] [address]  . WBC 03/02/2020 9.0  4.0 - 10.5 K/uL Final  . RBC 03/02/2020 3.22* 4.22 - 5.81 MIL/uL Final  . Hemoglobin 03/02/2020 8.2* 13.0 - 17.0 g/dL Final  . HCT 03/02/2020 26.0* 39.0 - 52.0 % Final  . MCV 03/02/2020 80.7  80.0 - 100.0 fL Final  . MCH 03/02/2020 25.5* 26.0 - 34.0 pg Final  . MCHC 03/02/2020 31.5  30.0 - 36.0 g/dL Final  . RDW 03/02/2020 13.1  11.5 - 15.5 % Final  . Platelets 03/02/2020 155  150 - 400 K/uL Final  . nRBC 03/02/2020 0.0  0.0 - 0.2 % Final   Performed at [facility], [place] [address] [place], [place] [address]  . WBC 03/03/2020 10.7* 4.0 - 10.5 K/uL Final  . RBC 03/03/2020 3.24* 4.22 - 5.81 MIL/uL Final  . Hemoglobin 03/03/2020 8.2* 13.0 - 17.0 g/dL Final  . HCT 03/03/2020 26.3* 39.0 - 52.0 % Final  . MCV 03/03/2020 81.2  80.0 - 100.0 fL Final  . MCH 03/03/2020 25.3* 26.0 - 34.0 pg Final  . MCHC 03/03/2020 31.2  30.0 - 36.0 g/dL Final  . RDW 03/03/2020 13.1  11.5 - 15.5 % Final  . Platelets 03/03/2020 159  150 - 400 K/uL Final  . nRBC 03/03/2020 0.0  0.0 - 0.2 % Final   Performed at [facility] [address], [place], [place] [address]  . Sodium 03/03/2020 136  135 - 145 mmol/L Final  . Potassium 03/03/2020 3.6  3.5 - 5.1 mmol/L Final  . Chloride 03/03/2020 107  98 - 111 mmol/L Final  . CO2 03/03/2020 23  22 - 32 mmol/L Final  . Glucose, Bld 03/03/2020 119* 70 - 99 mg/dL Final   Glucose reference range applies only to samples taken after fasting for at least 8 hours.  . BUN 03/03/2020 38* 8 - 23 mg/dL Final  . Creatinine, Ser 03/03/2020 1.45* 0.61 - 1.24 mg/dL Final  . Calcium 03/03/2020 9.6  8.9 - 10.3 mg/dL  Final  . Total Protein 03/03/2020 7.7  6.5 - 8.1 g/dL Final  . Albumin 03/03/2020 2.1* 3.5 - 5.0 g/dL Final  . AST 03/03/2020 50* 15 - 41 U/L Final  . ALT 03/03/2020 15  0 - 44 U/L Final  . Alkaline Phosphatase 03/03/2020 36* 38 - 126 U/L Final  . Total Bilirubin 03/03/2020 0.5  0.3 - 1.2 mg/dL Final  . GFR calc non Af Amer 03/03/2020 43* >60 mL/min Final  . GFR calc Af Amer 03/03/2020 49* >60 mL/min Final  . Anion gap 03/03/2020 6  5 - 15 Final   Performed at [facility], [place] [address], [place], [place] [address]  . Magnesium 03/03/2020 1.7  1.7 - 2.4 mg/dL Final   Performed at [facility], [place] [address], [place], [place] [address]    (this displays the last labs from the last 3 days)  Lab Results  Component Value Date   TOTALPROTELP 9.8 (H) 03/01/2020   ALBUMINELP 56.1 07/12/2012   A1GS 3.5 07/12/2012   A2GS 9.8 07/12/2012   BETS 5.1 07/12/2012   BETA2SER 3.6 07/12/2012   GAMS 21.9 (H) 07/12/2012   MSPIKE 0.87 07/12/2012   SPEI * 07/12/2012   (this displays SPEP labs)  Lab Results  Component Value Date   KPAFRELGTCHN 690.6 (H) 03/01/2020   LAMBDASER 5.9 03/01/2020   KAPLAMBRATIO 117.05 (H) 03/01/2020   (kappa/lambda light chains)  No results found for: HGBA, HGBA2QUANT, HGBFQUANT, HGBSQUAN (Hemoglobinopathy evaluation)   Lab Results  Component Value Date   LDH 136 01/09/2016    Lab Results  Component Value Date   IRON 49 03/01/2020   TIBC 238 (L) 03/01/2020   IRONPCTSAT 21 03/01/2020   (Iron and TIBC)  Lab Results  Component Value Date   FERRITIN 375 (H) 03/01/2020    Urinalysis    Component Value Date/Time   COLORURINE AMBER (A) 03/01/2020 1052   APPEARANCEUR CLOUDY (A) 03/01/2020 1052   LABSPEC 1.021 03/01/2020 1052   PHURINE 5.0 03/01/2020 1052   GLUCOSEU NEGATIVE 03/01/2020 1052   HGBUR NEGATIVE 03/01/2020 1052   BILIRUBINUR NEGATIVE 03/01/2020 1052   KETONESUR NEGATIVE 03/01/2020 1052   PROTEINUR 100 (A) 03/01/2020 1052    UROBILINOGEN 0.2 02/29/2020 1537   NITRITE NEGATIVE 03/01/2020 1052   LEUKOCYTESUR NEGATIVE 03/01/2020 1052     STUDIES: CT ABDOMEN PELVIS WO CONTRAST  Result Date: 03/01/2020 CLINICAL DATA:  Left abdominal pain, back pain x1 week EXAM: CT ABDOMEN AND PELVIS WITHOUT CONTRAST TECHNIQUE: Multidetector CT imaging of the abdomen and pelvis was performed following the standard protocol without IV contrast. COMPARISON:  12/28/2003 FINDINGS: Lower chest: Coronary calcifications. No pleural or pericardial effusion. Hepatobiliary: No focal liver abnormality is seen. No gallstones, gallbladder wall thickening, or biliary dilatation. Pancreas: Unremarkable. No pancreatic ductal dilatation or surrounding inflammatory changes. Spleen: Normal in size without focal abnormality. Adrenals/Urinary Tract: 4.3 cm left upper pole renal cyst. No hydronephrosis. Adrenal glands unremarkable. Urinary bladder incompletely distended. Stomach/Bowel: Stomach is within normal limits. Appendix not identified. No evidence of bowel wall thickening, distention, or inflammatory changes. Vascular/Lymphatic: Minimal aortoiliac atherosclerosis (ICD10-170.0) without aneurysm. No abdominal or pelvic adenopathy. Reproductive: Multiple metallic seeds around the prostate. Other: No ascites.  No free air. Musculoskeletal: Innumerable small lytic lesions throughout all visualized bones. Mild T12 compression deformity, age indeterminate. IMPRESSION: 1. Innumerable lytic lesions involving all visualized bones suggesting metastatic disease or multiple myeloma. Electronically Signed   By: Lucrezia Europe M.D.   On: 03/01/2020 13:38   US RENAL  Result Date: 03/01/2020 CLINICAL DATA:  AKI, left flank pain for 1 week EXAM: RENAL / URINARY TRACT ULTRASOUND COMPLETE COMPARISON:  None. FINDINGS: Right Kidney: Renal measurements: 11.8 x 4.8 x 4.9 cm = volume: 144 mL . Echogenicity within normal limits. No mass or hydronephrosis visualized. Left Kidney: Renal  measurements: 10.7 x 5.8 x 5.7 cm =  volume: 187 mL. Echogenicity within normal limits. Exophytic cyst measuring 4.1 cm. No mass or hydronephrosis visualized. Bladder: Appears normal for degree of bladder distention. Other: None. IMPRESSION: No ultrasound abnormality to explain left flank pain. No hydronephrosis. Exophytic cyst of the left kidney measuring 4 cm, in general unlikely to be symptomatic given modest size. Electronically Signed   By: Eddie Candle M.D.   On: 03/01/2020 16:43    ELIGIBLE FOR AVAILABLE RESEARCH PROTOCOL:   ASSESSMENT: 86 y.o.  67 [place] man with a history of M-GUS, now presenting with a high globulin fraction, worsening renal function, hypercalcemia, anemia and multiple lytic bone lesions.   (1) IgG Kappa Multiple Myeloma: Labs diagnostic on 03/01/2020 with Mspike of 3.8, Kappa free light chain 690.6, and ratio 117.05.  (a) CT A/P on 03/01/2020 shows "innumerable" lytic lesions involving all visualized bones  (b) bone marrow biopsy completed on 03/11/2020  (c) Bortezomib and Dexamethasone given weekly 3 weeks on and 1 week off beginning 03/11/2020   (2) Hypercalcemia  (a) Pamidronate administered on 03/02/2020   PLAN: Joe [name] is doing moderately well today.  HIs bone marrow procedure went well, and we should have results within the next week.  I reviewed Joe [name] with Dr. Jana Hakim, who also met with the patient. Due to his constipation, I added a CMET onto his labs, to evaluate his calcium level.  His renal function and calcium level has normalized.    We reviewed his Bortezomib weekly with the Dexamethasone.  His labs are stable to receive this today.  The constipation he is experiencing, is likely secondary to the Tramadol.  He has no exam findings of obstruction.  I recommended Senokot S 2 tabs BID, Miralax daily, and magnesium citrate 1/2 bottle if no BM in 3-4 days.  This was documented in his AVS and I sent the meds into his pharmacy.    I reviewed PMP aware and placed  orders for Tramadol #60 to his pharmacy for his myeloma related back pain.    We will see Joe [name] back in 1 week for labs and f/u.  Of course if he has any questions or concerns, he will contact us.  We can always see him sooner if needed.    Total encounter time: 30 minutes*  Wilber Bihari, NP   03/11/2020 7:55 AM Medical Oncology and Hematology [facility] [address] [place], [place] [address] Tel. [phone]    Fax. [phone]   ADDENDUM: Joe [name]' bone marrow biopsy shows 20% plasma cell infiltration.  Cytogenetics showed trisomy 59.  We again reviewed the diagnosis and treatment plan in this nontransplant candidate and the goal is to place him into remission with bortezomib and dexamethasone after which she will be maintained on Revlimid.  Joe [name] has a good understanding of this plan and is in agreement with it.  He is aware of the possible toxicities side effects and complications of the relevant agents.  We are starting treatment today.  He is scheduled to see [place] again in 1 week to assess tolerance.  We will be following the myeloma panel on an every 4-week basis.    He knows to call us for any other issue that may develop before the next visit.  I personally saw this patient and performed a substantive portion of this encounter with the listed APP documented above.   Chauncey Cruel, MD Medical Oncology and Hematology [facility] [address] [place], [place] [address] Tel. [phone]  Fax. [phone]

## 2020-03-11 NOTE — Progress Notes (Signed)
Patient observed for 30 minutes post procedure. VSS, dressing clean, dry, intact.  Patient complaint of slight discomfort at site of procedure, but denies sharp, intense pain.

## 2020-03-12 ENCOUNTER — Telehealth: Payer: Self-pay | Admitting: Adult Health

## 2020-03-12 NOTE — Telephone Encounter (Signed)
No 4/12 los. No changes made to pt's schedule.

## 2020-03-17 ENCOUNTER — Encounter: Payer: Self-pay | Admitting: Adult Health

## 2020-03-18 ENCOUNTER — Encounter (HOSPITAL_COMMUNITY): Payer: Self-pay

## 2020-03-18 ENCOUNTER — Encounter (HOSPITAL_COMMUNITY): Payer: Self-pay | Admitting: Oncology

## 2020-03-18 LAB — SURGICAL PATHOLOGY

## 2020-03-19 ENCOUNTER — Telehealth: Payer: Self-pay | Admitting: Adult Health

## 2020-03-19 ENCOUNTER — Other Ambulatory Visit: Payer: Self-pay | Admitting: *Deleted

## 2020-03-19 DIAGNOSIS — C9000 Multiple myeloma not having achieved remission: Secondary | ICD-10-CM

## 2020-03-19 NOTE — Telephone Encounter (Signed)
Scheduled appt per 4/19 sch message - called to confirm - pt wife aware.

## 2020-03-19 NOTE — Progress Notes (Signed)
Pharmacist Chemotherapy Monitoring - Follow Up Assessment    I verify that I have reviewed each item in the below checklist:  . Regimen for the patient is scheduled for the appropriate day and plan matches scheduled date. [name] Appropriate non-routine labs are ordered dependent on drug ordered. . If applicable, additional medications reviewed and ordered per protocol based on lifetime cumulative doses and/or treatment regimen.   Plan for follow-up and/or issues identified: No . I-vent associated with next due treatment: No . MD and/or nursing notified: No  Delane Ginger 03/19/2020 11:02 AM

## 2020-03-20 ENCOUNTER — Other Ambulatory Visit: Payer: Self-pay

## 2020-03-20 ENCOUNTER — Inpatient Hospital Stay (HOSPITAL_BASED_OUTPATIENT_CLINIC_OR_DEPARTMENT_OTHER): Payer: Medicare Other | Admitting: Adult Health

## 2020-03-20 ENCOUNTER — Inpatient Hospital Stay: Payer: Medicare Other

## 2020-03-20 VITALS — HR 97

## 2020-03-20 VITALS — BP 132/82 | HR 107 | Temp 98.5°F | Resp 20 | Ht 71.0 in | Wt 193.5 lb

## 2020-03-20 DIAGNOSIS — C9000 Multiple myeloma not having achieved remission: Secondary | ICD-10-CM

## 2020-03-20 DIAGNOSIS — Z5112 Encounter for antineoplastic immunotherapy: Secondary | ICD-10-CM | POA: Diagnosis not present

## 2020-03-20 LAB — CBC WITH DIFFERENTIAL (CANCER CENTER ONLY)
Abs Immature Granulocytes: 0.02 10*3/uL (ref 0.00–0.07)
Basophils Absolute: 0 10*3/uL (ref 0.0–0.1)
Basophils Relative: 0 %
Eosinophils Absolute: 0 10*3/uL (ref 0.0–0.5)
Eosinophils Relative: 1 %
HCT: 31 % — ABNORMAL LOW (ref 39.0–52.0)
Hemoglobin: 9.7 g/dL — ABNORMAL LOW (ref 13.0–17.0)
Immature Granulocytes: 0 %
Lymphocytes Relative: 17 %
Lymphs Abs: 0.9 10*3/uL (ref 0.7–4.0)
MCH: 24.9 pg — ABNORMAL LOW (ref 26.0–34.0)
MCHC: 31.3 g/dL (ref 30.0–36.0)
MCV: 79.7 fL — ABNORMAL LOW (ref 80.0–100.0)
Monocytes Absolute: 0.6 10*3/uL (ref 0.1–1.0)
Monocytes Relative: 11 %
Neutro Abs: 4.1 10*3/uL (ref 1.7–7.7)
Neutrophils Relative %: 71 %
Platelet Count: 241 10*3/uL (ref 150–400)
RBC: 3.89 MIL/uL — ABNORMAL LOW (ref 4.22–5.81)
RDW: 13.8 % (ref 11.5–15.5)
WBC Count: 5.7 10*3/uL (ref 4.0–10.5)
nRBC: 0 % (ref 0.0–0.2)

## 2020-03-20 LAB — CMP (CANCER CENTER ONLY)
ALT: 41 U/L (ref 0–44)
AST: 29 U/L (ref 15–41)
Albumin: 2.9 g/dL — ABNORMAL LOW (ref 3.5–5.0)
Alkaline Phosphatase: 169 U/L — ABNORMAL HIGH (ref 38–126)
Anion gap: 9 (ref 5–15)
BUN: 21 mg/dL (ref 8–23)
CO2: 27 mmol/L (ref 22–32)
Calcium: 9 mg/dL (ref 8.9–10.3)
Chloride: 98 mmol/L (ref 98–111)
Creatinine: 1.31 mg/dL — ABNORMAL HIGH (ref 0.61–1.24)
GFR, Est AFR Am: 56 mL/min — ABNORMAL LOW (ref >60)
GFR, Estimated: 48 mL/min — ABNORMAL LOW (ref >60)
Glucose, Bld: 110 mg/dL — ABNORMAL HIGH (ref 70–99)
Potassium: 3.6 mmol/L (ref 3.5–5.1)
Sodium: 134 mmol/L — ABNORMAL LOW (ref 135–145)
Total Bilirubin: 0.4 mg/dL (ref 0.3–1.2)
Total Protein: 8.2 g/dL — ABNORMAL HIGH (ref 6.5–8.1)

## 2020-03-20 MED ORDER — PROCHLORPERAZINE MALEATE 10 MG PO TABS
10.0000 mg | ORAL_TABLET | Freq: Once | ORAL | Status: AC
  Administered 2020-03-20: 15:00:00 10 mg via ORAL

## 2020-03-20 MED ORDER — DEXAMETHASONE 4 MG PO TABS
40.0000 mg | ORAL_TABLET | Freq: Once | ORAL | Status: AC
  Administered 2020-03-20: 40 mg via ORAL

## 2020-03-20 MED ORDER — BORTEZOMIB CHEMO SQ INJECTION 3.5 MG (2.5MG/ML)
1.3000 mg/m2 | Freq: Once | INTRAMUSCULAR | Status: AC
  Administered 2020-03-20: 16:00:00 2.75 mg via SUBCUTANEOUS
  Filled 2020-03-20: qty 1.1

## 2020-03-20 MED ORDER — PROCHLORPERAZINE MALEATE 10 MG PO TABS
ORAL_TABLET | ORAL | Status: AC
  Filled 2020-03-20: qty 1

## 2020-03-20 NOTE — Progress Notes (Signed)
[place]  [phone] [phone]     ID: [name] DOB: 16-Jan-1929  MR#: [id number]  OFB#:[id number]  Patient Care Team: Jani Gravel, MD as PCP - General (Internal Medicine) Scot Dock, NP OTHER MD:  CHIEF COMPLAINT: multiple myeloma  CURRENT TREATMENT: Bortezomib, Dexamethasone   HISTORY OF CURRENT ILLNESS:  Mr. Jani is an 86 y/o [place] man formerly followed by my partner Dr Julien Nordmann for M-Gus. We have an M-Spike of 0.87 documented 07/12/2012. [name] kappa/lambda light chains and total IgG were follower yearly until 01/2016, when they were 2.36 and 1517 respectively (unremarkable). He was lost to follow-up after that point.  Yesterday the patient presented to Urgent Care with poorly controlled pain. This was felt to be mussculoskeletal and he was started on a medrol dose-pak and robaxin. However his pain worsened and he presented to the ED at [facility] where vitals and exam were unremarkable but labs showed a creatinine of 1.84 (baseline 1.0), calcium 12.7 with albumin 2.8 and total protein 10.4 (globulin fraction 7.8). CT of the abdomen obtained for evaluation of his abdominal and back pain showed no hydronephrosis but multiple lytic lesons. We were consulted for further evaluation and treatment of likely multiple myeloma and he was admitted to the hospital.    The patient's subsequent history is as detailed below.   INTERVAL HISTORY: Ludger is doing moderately well today.  I performed his bone marrow biopsy today, please see the separate procedure note about this.  I reviewed his hospitalization from 03/01/2020 through 03/03/2020.  He notes that he started experiencing pain in his back and went to ER.  He was discharged from the [facility] on 03/03/2020.   He did have hypercalcemia and received IV hydration and Pamidronate for this.     He is taking tramadol about once or twice a day.  He notes some soreness in his ribs  bilaterally.    He started Bortezomib last week and notes he tolerated it well.     At his last visit he underwent bone marrow biopsy which showed 20% plasma cells ina  50% cellular marrow. Cytogenetics showed Trisomy 65 which is associated with standard risk.  REVIEW OF SYSTEMS: Charon was constipated last week.  He started taking miralax and has been having regular bowel movements since then.  His last bowel movement was yesterday.  He denies any fever, chills, chest pain, palpitations, cough, shortness of breath, bowel/bladder issues, headaches, vision issues, skin issues, peripheral neuropathy, or any further concerns.  A detailed ROS Was otherwise non contributory.       PAST MEDICAL HISTORY: Past Medical History:  Diagnosis Date  . Enlarged prostate   . Hypertension     PAST SURGICAL HISTORY: Past Surgical History:  Procedure Laterality Date  . EYE SURGERY    . PROSTATE SURGERY      FAMILY HISTORY Family History  Problem Relation Age of Onset  . Hypertension Mother          SOCIAL HISTORY: Retired, used to work for the [employer] Lives with wife [place]. Has 4 children, 2 in [place], one in [place], one in [place]; 5 grandchildren and 5 great-grandchildren. Attands a [employer].     ADVANCED DIRECTIVES:  at the initial visit confirmed the patient's wife is his HCPOA  HEALTH MAINTENANCE: Social History   Tobacco Use  . Smoking status: Never Smoker  . Smokeless tobacco: Never Used  Substance Use Topics  . Alcohol use: Never  .  Drug use: Never     Colonoscopy: 6-8 years ago     No Known Allergies  Current Outpatient Medications  Medication Sig Dispense Refill  . acetaminophen (TYLENOL) 500 MG tablet Take 1,000 mg by mouth every 6 (six) hours as needed for mild pain.    [name] acyclovir (ZOVIRAX) 400 MG tablet Take 1 tablet (400 mg total) by mouth 2 (two) times daily. 60 tablet 3  . amLODipine (NORVASC) 5 MG tablet Take 5 mg by mouth  daily.    [name] doxazosin (CARDURA) 4 MG tablet Take 4 mg by mouth at bedtime.    . famotidine (PEPCID) 20 MG tablet Take 20 mg by mouth daily.    . finasteride (PROSCAR) 5 MG tablet Take 1 tablet (5 mg total) by mouth daily. 30 tablet 3  . indapamide (LOZOL) 2.5 MG tablet Take 2.5 mg by mouth daily.     . magnesium citrate SOLN Take 1/2 bottle if no bowel movement in 2 hours repeat 195 mL 1  . methocarbamol (ROBAXIN) 500 MG tablet Take 1 tablet (500 mg total) by mouth 2 (two) times daily. 30 tablet 0  . polyethylene glycol powder (MIRALAX) 17 GM/SCOOP powder Take 255 g by mouth daily. 255 g 0  . pravastatin (PRAVACHOL) 40 MG tablet Take 40 mg by mouth daily.    . predniSONE (DELTASONE) 20 MG tablet Take 1 tablet (20 mg total) by mouth daily with breakfast. 7 tablet 0  . prochlorperazine (COMPAZINE) 10 MG tablet Take 1 tablet (10 mg total) by mouth every 6 (six) hours as needed (Nausea or vomiting). 30 tablet 1  . senna-docusate (SENOKOT-S) 8.6-50 MG tablet Take 2 tablets by mouth 2 (two) times daily. 120 tablet 0  . traMADol (ULTRAM) 50 MG tablet Take 1 tablet (50 mg total) by mouth every 8 (eight) hours as needed for severe pain. 60 tablet 0  . vitamin B-12 (CYANOCOBALAMIN) 1000 MCG tablet Take 1,000 mcg by mouth daily.     No current facility-administered medications for this visit.    OBJECTIVE:   Vitals:   03/20/20 1340  BP: 132/82  Pulse: (!) 107  Resp: 20  Temp: 98.5 F (36.9 C)  SpO2: 97%     Body mass index is 26.99 kg/m.   Wt Readings from Last 3 Encounters:  03/20/20 193 lb 8 oz (87.8 kg)  03/01/20 205 lb (93 kg)  02/29/20 205 lb (93 kg)      ECOG FS:2 - Symptomatic, <50% confined to bed  GENERAL: Patient is a well appearing male in no acute distress HEENT:  Sclerae anicteric.  Mask in place. Neck is supple.  NODES:  No cervical, supraclavicular lymphadenopathy palpated.  LUNGS:  Clear to auscultation bilaterally.  No wheezes or rhonchi. HEART:  Regular rate and  rhythm. No murmur appreciated. ABDOMEN:  Soft, nontender.  Positive, normoactive bowel sounds. No organomegaly palpated. MSK:  No focal spinal tenderness to palpation. EXTREMITIES:  No peripheral edema.   SKIN:  Clear with no obvious rashes or skin changes. No nail dyscrasia. NEURO:  Nonfocal. Well oriented.  Appropriate affect.     LAB RESULTS:  CMP     Component Value Date/Time   NA 135 03/11/2020 0830   NA 140 01/09/2016 1045   K 3.4 (L) 03/11/2020 0830   K 3.8 01/09/2016 1045   CL 103 03/11/2020 0830   CO2 26 03/11/2020 0830   CO2 27 01/09/2016 1045   GLUCOSE 92 03/11/2020 0830   GLUCOSE 89 01/09/2016 1045  BUN 19 03/11/2020 0830   BUN 14.7 01/09/2016 1045   CREATININE 1.06 03/11/2020 0830   CREATININE 1.0 01/09/2016 1045   CALCIUM 8.4 (L) 03/11/2020 0830   CALCIUM 9.2 01/09/2016 1045   PROT 8.5 (H) 03/11/2020 0830   PROT 7.3 01/09/2016 1045   ALBUMIN 2.7 (L) 03/11/2020 0830   ALBUMIN 3.7 01/09/2016 1045   AST 13 (L) 03/11/2020 0830   AST 18 01/09/2016 1045   ALT 17 03/11/2020 0830   ALT 15 01/09/2016 1045   ALKPHOS 96 03/11/2020 0830   ALKPHOS 60 01/09/2016 1045   BILITOT 0.4 03/11/2020 0830   BILITOT 0.51 01/09/2016 1045   GFRNONAA >60 03/11/2020 0830   GFRAA >60 03/11/2020 0830    Lab Results  Component Value Date   TOTALPROTELP 9.8 (H) 03/01/2020   ALBUMINELP 56.1 07/12/2012   A1GS 3.5 07/12/2012   A2GS 9.8 07/12/2012   BETS 5.1 07/12/2012   BETA2SER 3.6 07/12/2012   GAMS 21.9 (H) 07/12/2012   MSPIKE 0.87 07/12/2012   SPEI * 07/12/2012     Lab Results  Component Value Date   KPAFRELGTCHN 690.6 (H) 03/01/2020   LAMBDASER 5.9 03/01/2020   KAPLAMBRATIO 117.05 (H) 03/01/2020    Lab Results  Component Value Date   WBC 5.7 03/20/2020   NEUTROABS 4.1 03/20/2020   HGB 9.7 (L) 03/20/2020   HCT 31.0 (L) 03/20/2020   MCV 79.7 (L) 03/20/2020   PLT 241 03/20/2020      Chemistry      Component Value Date/Time   NA 135 03/11/2020 0830   NA  140 01/09/2016 1045   K 3.4 (L) 03/11/2020 0830   K 3.8 01/09/2016 1045   CL 103 03/11/2020 0830   CO2 26 03/11/2020 0830   CO2 27 01/09/2016 1045   BUN 19 03/11/2020 0830   BUN 14.7 01/09/2016 1045   CREATININE 1.06 03/11/2020 0830   CREATININE 1.0 01/09/2016 1045      Component Value Date/Time   CALCIUM 8.4 (L) 03/11/2020 0830   CALCIUM 9.2 01/09/2016 1045   ALKPHOS 96 03/11/2020 0830   ALKPHOS 60 01/09/2016 1045   AST 13 (L) 03/11/2020 0830   AST 18 01/09/2016 1045   ALT 17 03/11/2020 0830   ALT 15 01/09/2016 1045   BILITOT 0.4 03/11/2020 0830   BILITOT 0.51 01/09/2016 1045       No results found for: LABCA2  No components found for: AOZHYQ657  No results for input(s): INR in the last 168 hours.  No results found for: LABCA2  No results found for: QIO962  No results found for: XBM841  No results found for: LKG401  No results found for: CA2729  No components found for: HGQUANT  No results found for: CEA1 / No results found for: CEA1   No results found for: AFPTUMOR  No results found for: CHROMOGRNA  No results found for: PSA1  Appointment on 03/20/2020  Component Date Value Ref Range Status  . WBC Count 03/20/2020 5.7  4.0 - 10.5 K/uL Final  . RBC 03/20/2020 3.89* 4.22 - 5.81 MIL/uL Final  . Hemoglobin 03/20/2020 9.7* 13.0 - 17.0 g/dL Final  . HCT 03/20/2020 31.0* 39.0 - 52.0 % Final  . MCV 03/20/2020 79.7* 80.0 - 100.0 fL Final  . MCH 03/20/2020 24.9* 26.0 - 34.0 pg Final  . MCHC 03/20/2020 31.3  30.0 - 36.0 g/dL Final  . RDW 03/20/2020 13.8  11.5 - 15.5 % Final  . Platelet Count 03/20/2020 241  150 - 400 K/uL  Final  . nRBC 03/20/2020 0.0  0.0 - 0.2 % Final  . Neutrophils Relative % 03/20/2020 71  % Final  . Neutro Abs 03/20/2020 4.1  1.7 - 7.7 K/uL Final  . Lymphocytes Relative 03/20/2020 17  % Final  . Lymphs Abs 03/20/2020 0.9  0.7 - 4.0 K/uL Final  . Monocytes Relative 03/20/2020 11  % Final  . Monocytes Absolute 03/20/2020 0.6  0.1 - 1.0  K/uL Final  . Eosinophils Relative 03/20/2020 1  % Final  . Eosinophils Absolute 03/20/2020 0.0  0.0 - 0.5 K/uL Final  . Basophils Relative 03/20/2020 0  % Final  . Basophils Absolute 03/20/2020 0.0  0.0 - 0.1 K/uL Final  . Immature Granulocytes 03/20/2020 0  % Final  . Abs Immature Granulocytes 03/20/2020 0.02  0.00 - 0.07 K/uL Final   Performed at [facility], [place] [address] [place], [place] [address]    (this displays the last labs from the last 3 days)  Lab Results  Component Value Date   TOTALPROTELP 9.8 (H) 03/01/2020   ALBUMINELP 56.1 07/12/2012   A1GS 3.5 07/12/2012   A2GS 9.8 07/12/2012   BETS 5.1 07/12/2012   BETA2SER 3.6 07/12/2012   GAMS 21.9 (H) 07/12/2012   MSPIKE 0.87 07/12/2012   SPEI * 07/12/2012   (this displays SPEP labs)  Lab Results  Component Value Date   KPAFRELGTCHN 690.6 (H) 03/01/2020   LAMBDASER 5.9 03/01/2020   KAPLAMBRATIO 117.05 (H) 03/01/2020   (kappa/lambda light chains)  No results found for: HGBA, HGBA2QUANT, HGBFQUANT, HGBSQUAN (Hemoglobinopathy evaluation)   Lab Results  Component Value Date   LDH 136 01/09/2016    Lab Results  Component Value Date   IRON 49 03/01/2020   TIBC 238 (L) 03/01/2020   IRONPCTSAT 21 03/01/2020   (Iron and TIBC)  Lab Results  Component Value Date   FERRITIN 375 (H) 03/01/2020    Urinalysis    Component Value Date/Time   COLORURINE AMBER (A) 03/01/2020 1052   APPEARANCEUR CLOUDY (A) 03/01/2020 1052   LABSPEC 1.021 03/01/2020 1052   PHURINE 5.0 03/01/2020 1052   GLUCOSEU NEGATIVE 03/01/2020 1052   HGBUR NEGATIVE 03/01/2020 1052   BILIRUBINUR NEGATIVE 03/01/2020 1052   KETONESUR NEGATIVE 03/01/2020 1052   PROTEINUR 100 (A) 03/01/2020 1052   UROBILINOGEN 0.2 02/29/2020 1537   NITRITE NEGATIVE 03/01/2020 1052   LEUKOCYTESUR NEGATIVE 03/01/2020 1052     STUDIES: CT ABDOMEN PELVIS WO CONTRAST  Result Date: 03/01/2020 CLINICAL DATA:  Left abdominal pain, back  pain x1 week EXAM: CT ABDOMEN AND PELVIS WITHOUT CONTRAST TECHNIQUE: Multidetector CT imaging of the abdomen and pelvis was performed following the standard protocol without IV contrast. COMPARISON:  12/28/2003 FINDINGS: Lower chest: Coronary calcifications. No pleural or pericardial effusion. Hepatobiliary: No focal liver abnormality is seen. No gallstones, gallbladder wall thickening, or biliary dilatation. Pancreas: Unremarkable. No pancreatic ductal dilatation or surrounding inflammatory changes. Spleen: Normal in size without focal abnormality. Adrenals/Urinary Tract: 4.3 cm left upper pole renal cyst. No hydronephrosis. Adrenal glands unremarkable. Urinary bladder incompletely distended. Stomach/Bowel: Stomach is within normal limits. Appendix not identified. No evidence of bowel wall thickening, distention, or inflammatory changes. Vascular/Lymphatic: Minimal aortoiliac atherosclerosis (ICD10-170.0) without aneurysm. No abdominal or pelvic adenopathy. Reproductive: Multiple metallic seeds around the prostate. Other: No ascites.  No free air. Musculoskeletal: Innumerable small lytic lesions throughout all visualized bones. Mild T12 compression deformity, age indeterminate. IMPRESSION: 1. Innumerable lytic lesions involving all visualized bones suggesting metastatic disease or multiple myeloma. Electronically  Signed   By: Lucrezia Europe M.D.   On: 03/01/2020 13:38   US RENAL  Result Date: 03/01/2020 CLINICAL DATA:  AKI, left flank pain for 1 week EXAM: RENAL / URINARY TRACT ULTRASOUND COMPLETE COMPARISON:  None. FINDINGS: Right Kidney: Renal measurements: 11.8 x 4.8 x 4.9 cm = volume: 144 mL . Echogenicity within normal limits. No mass or hydronephrosis visualized. Left Kidney: Renal measurements: 10.7 x 5.8 x 5.7 cm = volume: 187 mL. Echogenicity within normal limits. Exophytic cyst measuring 4.1 cm. No mass or hydronephrosis visualized. Bladder: Appears normal for degree of bladder distention. Other: None.  IMPRESSION: No ultrasound abnormality to explain left flank pain. No hydronephrosis. Exophytic cyst of the left kidney measuring 4 cm, in general unlikely to be symptomatic given modest size. Electronically Signed   By: Eddie Candle M.D.   On: 03/01/2020 16:43    ELIGIBLE FOR AVAILABLE RESEARCH PROTOCOL:   ASSESSMENT: 86 y.o.  54 [place] man with a history of M-GUS, now presenting with a high globulin fraction, worsening renal function, hypercalcemia, anemia and multiple lytic bone lesions.   (1) IgG Kappa Multiple Myeloma: Labs diagnostic on 03/01/2020 with Mspike of 3.8, Kappa free light chain 690.6, and ratio 117.05.  (a) CT A/P on 03/01/2020 shows "innumerable" lytic lesions involving all visualized bones  (b) bone marrow biopsy completed on 03/11/2020  (c) Bortezomib and Dexamethasone given weekly 3 weeks on and 1 week off beginning 03/11/2020   (2) Hypercalcemia  (a) Pamidronate administered on 03/02/2020   PLAN: Cynthia is doing well today.  He tolerated his first Bortezomib injection quite well and he will continue this weekly, three weeks on and 1 week off.  I reviewed his marrow results with him, and discussed that he has no high risk cytogenetics on his bone marrow.    He was very constipated last week, and will continue his current bowel regimen to keep his bowels moving.  This is secondary to the tramadol that he is taking.    Vir continues on tramadol one to two times a day for his back pain.  His back pain is related to his multiple myeloma and is not getting worse.  He will continue on tramadol as he is able to remain functional and have a good activity level with this.  It is not making him too sleepy.    [place] will return next week for labs and Bortezomib.  He will see Dr. Jana Hakim when he returns to start his three weeks on and one week off cycle on 04/10/2020.  Of course if he has any questions or concerns, he will contact us.  We can always see him sooner if needed.    I  reviewed the above with Dr. Jana Hakim in detail.  He is in agreement with the plan.    Total encounter time: 30 minutes*  Wilber Bihari, NP   03/20/2020 2:07 PM Medical Oncology and Hematology [facility] [address] [place], [place] [address] Tel. [phone]    Fax. [phone]  *Total Encounter Time as defined by the Centers for Medicare and Medicaid Services includes, in addition to the face-to-face time of a patient visit (documented in the note above) non-face-to-face time: obtaining and reviewing outside history, ordering and reviewing medications, tests or procedures, care coordination (communications with other health care professionals or caregivers) and documentation in the medical record.

## 2020-03-20 NOTE — Patient Instructions (Signed)
[facility] Discharge Instructions for Patients Receiving Chemotherapy  Today you received the following chemotherapy agents: velcade.  To help prevent nausea and vomiting after your treatment, we encourage you to take your nausea medication as prescribed by your physician.    If you develop nausea and vomiting that is not controlled by your nausea medication, call the clinic.   BELOW ARE SYMPTOMS THAT SHOULD BE REPORTED IMMEDIATELY:  *FEVER GREATER THAN 100.5 F  *CHILLS WITH OR WITHOUT FEVER  NAUSEA AND VOMITING THAT IS NOT CONTROLLED WITH YOUR NAUSEA MEDICATION  *UNUSUAL SHORTNESS OF BREATH  *UNUSUAL BRUISING OR BLEEDING  TENDERNESS IN MOUTH AND THROAT WITH OR WITHOUT PRESENCE OF ULCERS  *URINARY PROBLEMS  *BOWEL PROBLEMS  UNUSUAL RASH Items with * indicate a potential emergency and should be followed up as soon as possible.  Feel free to call the clinic should you have any questions or concerns. The clinic phone number is [phone].  Please show the [place] at check-in to the Emergency Department and triage nurse.  Bortezomib injection What is this medicine? BORTEZOMIB (bor TEZ oh mib) is a medicine that targets proteins in cancer cells and stops the cancer cells from growing. It is used to treat multiple myeloma and mantle-cell lymphoma. This medicine may be used for other purposes; ask your health care provider or pharmacist if you have questions. COMMON BRAND NAME(S): Velcade What should I tell my health care provider before I take this medicine? They need to know if you have any of these conditions:  diabetes  heart disease  irregular heartbeat  liver disease  on hemodialysis  low blood counts, like low white blood cells, platelets, or hemoglobin  peripheral neuropathy  taking medicine for blood pressure  an unusual or allergic reaction to bortezomib, mannitol, boron, other medicines, foods, dyes, or  preservatives  pregnant or trying to get pregnant  breast-feeding How should I use this medicine? This medicine is for injection into a vein or for injection under the skin. It is given by a health care professional in a hospital or clinic setting. Talk to your pediatrician regarding the use of this medicine in children. Special care may be needed. Overdosage: If you think you have taken too much of this medicine contact a poison control center or emergency room at once. NOTE: This medicine is only for you. Do not share this medicine with others. What if I miss a dose? It is important not to miss your dose. Call your doctor or health care professional if you are unable to keep an appointment. What may interact with this medicine? This medicine may interact with the following medications:  ketoconazole  rifampin  ritonavir  St. John's Wort This list may not describe all possible interactions. Give your health care provider a list of all the medicines, herbs, non-prescription drugs, or dietary supplements you use. Also tell them if you smoke, drink alcohol, or use illegal drugs. Some items may interact with your medicine. What should I watch for while using this medicine? You may get drowsy or dizzy. Do not drive, use machinery, or do anything that needs mental alertness until you know how this medicine affects you. Do not stand or sit up quickly, especially if you are an older patient. This reduces the risk of dizzy or fainting spells. In some cases, you may be given additional medicines to help with side effects. Follow all directions for their use. Call your doctor or health care professional for advice if  you get a fever, chills or sore throat, or other symptoms of a cold or flu. Do not treat yourself. This drug decreases your body's ability to fight infections. Try to avoid being around people who are sick. This medicine may increase your risk to bruise or bleed. Call your doctor or  health care professional if you notice any unusual bleeding. You may need blood work done while you are taking this medicine. In some patients, this medicine may cause a serious brain infection that may cause death. If you have any problems seeing, thinking, speaking, walking, or standing, tell your doctor right away. If you cannot reach your doctor, urgently seek other source of medical care. Check with your doctor or health care professional if you get an attack of severe diarrhea, nausea and vomiting, or if you sweat a lot. The loss of too much body fluid can make it dangerous for you to take this medicine. Do not become pregnant while taking this medicine or for at least 7 months after stopping it. Women should inform their doctor if they wish to become pregnant or think they might be pregnant. Men should not father a child while taking this medicine and for at least 4 months after stopping it. There is a potential for serious side effects to an unborn child. Talk to your health care professional or pharmacist for more information. Do not breast-feed an infant while taking this medicine or for 2 months after stopping it. This medicine may interfere with the ability to have a child. You should talk with your doctor or health care professional if you are concerned about your fertility. What side effects may I notice from receiving this medicine? Side effects that you should report to your doctor or health care professional as soon as possible:  allergic reactions like skin rash, itching or hives, swelling of the face, lips, or tongue  breathing problems  changes in hearing  changes in vision  fast, irregular heartbeat  feeling faint or lightheaded, falls  pain, tingling, numbness in the hands or feet  right upper belly pain  seizures  swelling of the ankles, feet, hands  unusual bleeding or bruising  unusually weak or tired  vomiting  yellowing of the eyes or skin Side effects  that usually do not require medical attention (report to your doctor or health care professional if they continue or are bothersome):  changes in emotions or moods  constipation  diarrhea  loss of appetite  headache  irritation at site where injected  nausea This list may not describe all possible side effects. Call your doctor for medical advice about side effects. You may report side effects to FDA at 1-800-FDA-1088. Where should I keep my medicine? This drug is given in a hospital or clinic and will not be stored at home. NOTE: This sheet is a summary. It may not cover all possible information. If you have questions about this medicine, talk to your doctor, pharmacist, or health care provider.  2020 Elsevier/Gold Standard (2018-03-28 16:29:31)

## 2020-03-26 ENCOUNTER — Other Ambulatory Visit: Payer: Self-pay

## 2020-03-26 DIAGNOSIS — C9000 Multiple myeloma not having achieved remission: Secondary | ICD-10-CM

## 2020-03-27 ENCOUNTER — Inpatient Hospital Stay: Payer: Medicare Other

## 2020-03-27 ENCOUNTER — Other Ambulatory Visit: Payer: Self-pay

## 2020-03-27 ENCOUNTER — Other Ambulatory Visit: Payer: Medicare Other

## 2020-03-27 ENCOUNTER — Ambulatory Visit: Payer: Medicare Other

## 2020-03-27 VITALS — BP 136/79 | HR 86 | Temp 98.5°F | Resp 18

## 2020-03-27 DIAGNOSIS — C9000 Multiple myeloma not having achieved remission: Secondary | ICD-10-CM

## 2020-03-27 DIAGNOSIS — Z5112 Encounter for antineoplastic immunotherapy: Secondary | ICD-10-CM | POA: Diagnosis not present

## 2020-03-27 LAB — CBC WITH DIFFERENTIAL (CANCER CENTER ONLY)
Abs Immature Granulocytes: 0.01 10*3/uL (ref 0.00–0.07)
Basophils Absolute: 0 10*3/uL (ref 0.0–0.1)
Basophils Relative: 0 %
Eosinophils Absolute: 0 10*3/uL (ref 0.0–0.5)
Eosinophils Relative: 1 %
HCT: 32.2 % — ABNORMAL LOW (ref 39.0–52.0)
Hemoglobin: 10 g/dL — ABNORMAL LOW (ref 13.0–17.0)
Immature Granulocytes: 0 %
Lymphocytes Relative: 17 %
Lymphs Abs: 0.9 10*3/uL (ref 0.7–4.0)
MCH: 24.9 pg — ABNORMAL LOW (ref 26.0–34.0)
MCHC: 31.1 g/dL (ref 30.0–36.0)
MCV: 80.1 fL (ref 80.0–100.0)
Monocytes Absolute: 0.7 10*3/uL (ref 0.1–1.0)
Monocytes Relative: 13 %
Neutro Abs: 3.7 10*3/uL (ref 1.7–7.7)
Neutrophils Relative %: 69 %
Platelet Count: 204 10*3/uL (ref 150–400)
RBC: 4.02 MIL/uL — ABNORMAL LOW (ref 4.22–5.81)
RDW: 14 % (ref 11.5–15.5)
WBC Count: 5.3 10*3/uL (ref 4.0–10.5)
nRBC: 0 % (ref 0.0–0.2)

## 2020-03-27 LAB — CMP (CANCER CENTER ONLY)
ALT: 20 U/L (ref 0–44)
AST: 15 U/L (ref 15–41)
Albumin: 3 g/dL — ABNORMAL LOW (ref 3.5–5.0)
Alkaline Phosphatase: 211 U/L — ABNORMAL HIGH (ref 38–126)
Anion gap: 9 (ref 5–15)
BUN: 15 mg/dL (ref 8–23)
CO2: 27 mmol/L (ref 22–32)
Calcium: 8.8 mg/dL — ABNORMAL LOW (ref 8.9–10.3)
Chloride: 99 mmol/L (ref 98–111)
Creatinine: 1.05 mg/dL (ref 0.61–1.24)
GFR, Est AFR Am: >60 mL/min (ref >60)
GFR, Estimated: >60 mL/min (ref >60)
Glucose, Bld: 136 mg/dL — ABNORMAL HIGH (ref 70–99)
Potassium: 3.3 mmol/L — ABNORMAL LOW (ref 3.5–5.1)
Sodium: 135 mmol/L (ref 135–145)
Total Bilirubin: 0.4 mg/dL (ref 0.3–1.2)
Total Protein: 7.7 g/dL (ref 6.5–8.1)

## 2020-03-27 MED ORDER — DEXAMETHASONE 4 MG PO TABS
ORAL_TABLET | ORAL | Status: AC
  Filled 2020-03-27: qty 10

## 2020-03-27 MED ORDER — PROCHLORPERAZINE MALEATE 10 MG PO TABS
ORAL_TABLET | ORAL | Status: AC
  Filled 2020-03-27: qty 1

## 2020-03-27 MED ORDER — BORTEZOMIB CHEMO SQ INJECTION 3.5 MG (2.5MG/ML)
1.3000 mg/m2 | Freq: Once | INTRAMUSCULAR | Status: AC
  Administered 2020-03-27: 15:00:00 2.75 mg via SUBCUTANEOUS
  Filled 2020-03-27: qty 1.1

## 2020-03-27 MED ORDER — DEXAMETHASONE 4 MG PO TABS
40.0000 mg | ORAL_TABLET | Freq: Once | ORAL | Status: AC
  Administered 2020-03-27: 40 mg via ORAL

## 2020-03-27 MED ORDER — PROCHLORPERAZINE MALEATE 10 MG PO TABS
10.0000 mg | ORAL_TABLET | Freq: Once | ORAL | Status: AC
  Administered 2020-03-27: 14:00:00 10 mg via ORAL

## 2020-03-27 NOTE — Patient Instructions (Signed)
[facility] Discharge Instructions for Patients Receiving Chemotherapy  Today you received the following chemotherapy agents: bortezomib.  To help prevent nausea and vomiting after your treatment, we encourage you to take your nausea medication as directed.   If you develop nausea and vomiting that is not controlled by your nausea medication, call the clinic.   BELOW ARE SYMPTOMS THAT SHOULD BE REPORTED IMMEDIATELY:  *FEVER GREATER THAN 100.5 F  *CHILLS WITH OR WITHOUT FEVER  NAUSEA AND VOMITING THAT IS NOT CONTROLLED WITH YOUR NAUSEA MEDICATION  *UNUSUAL SHORTNESS OF BREATH  *UNUSUAL BRUISING OR BLEEDING  TENDERNESS IN MOUTH AND THROAT WITH OR WITHOUT PRESENCE OF ULCERS  *URINARY PROBLEMS  *BOWEL PROBLEMS  UNUSUAL RASH Items with * indicate a potential emergency and should be followed up as soon as possible.  Feel free to call the clinic should you have any questions or concerns. The clinic phone number is [phone].  Please show the CHEMO ALERT CARD at check-in to the Emergency Department and triage nurse.   

## 2020-03-29 ENCOUNTER — Telehealth: Payer: Self-pay | Admitting: *Deleted

## 2020-03-29 MED ORDER — ONDANSETRON HCL 8 MG PO TABS
8.0000 mg | ORAL_TABLET | Freq: Three times a day (TID) | ORAL | 1 refills | Status: DC | PRN
Start: 2020-03-29 — End: 2020-04-10

## 2020-03-29 NOTE — Telephone Encounter (Signed)
Pt called to this RN at 420 pm stating he has developed nausea today that is not relieved with anti nausea medication " I took it twice today ".  Note pt is taking pulse dose steroids.  This RN inquired if discomfort was more of indigestion vs nausea with Joe [name] stating " nausea ".  This RN inquired if he anti nausea med used was compazine- and he was not sure but then verified post checking his medications.  This RN asked if he was still taking the pepcid/famotidine but he was not able to locate medication - therefore was not sure if he was on it.  This RN reassured pt that additional anti nausea medication will be called in to verified pharmacy.  This RN called pharmacy and spoke with pharmacist directly- prescription for zofran given as well as request to give refill of pepcid ( last filled 4/5 ) so pt can take as well.

## 2020-04-04 NOTE — Progress Notes (Signed)
Pharmacist Chemotherapy Monitoring - Follow Up Assessment    I verify that I have reviewed each item in the below checklist:  . Regimen for the patient is scheduled for the appropriate day and plan matches scheduled date. [name] Appropriate non-routine labs are ordered dependent on drug ordered. . If applicable, additional medications reviewed and ordered per protocol based on lifetime cumulative doses and/or treatment regimen.   Plan for follow-up and/or issues identified: No . I-vent associated with next due treatment: No . MD and/or nursing notified: No  Britt Boozer 04/04/2020 12:43 PM

## 2020-04-09 NOTE — Progress Notes (Signed)
[facility]  [phone] [phone]     ID: Joe [name] DOB: 06-16-1929  MR#: [id number]  AST#:[id number]  Patient Care Team: Jani Gravel, MD as PCP - General (Internal Medicine) Chauncey Cruel, MD OTHER MD:  CHIEF COMPLAINT: multiple myeloma  CURRENT TREATMENT: Bortezomib, Dexamethasone, denosumab Del[place] See   INTERVAL HISTORY: Joe [name] returns today for follow up of his multiple myeloma.   He continues on bortezomib.  He tolerates this without any side effects and in particular he denies any peripheral neuropathy issues.  He also receives dexamethasone weekly.  He has had no thrush, fluid retention, mental status changes or other symptoms that he is aware of.   REVIEW OF SYSTEMS: Joe [name] tells me he is a little tired and he cannot garden the way he would like to.  He remains concerned about his wife's memory loss but says it is not severe and she for example can be alone in the house safely.  For Mother's Day everybody came to their house and there was a big party and everybody ate a lot and was very happy he says.  He is minimally constipated at times and is using the appropriate medications to deal with that.  Currently he has no pain.  He has lost his dentist and needs to establish himself with a new one.  A detailed review of systems today was otherwise stable   HISTORY OF CURRENT ILLNESS: From the original intake note:  Joe [name] is an 86 y/o [place] man formerly followed by my partner Dr Julien Nordmann for M-GUS. We have an M-Spike of 0.87 documented 07/12/2012. [name] kappa/lambda light chains and total IgG were follower yearly until 01/2016, when they were 2.36 and 1517 respectively (unremarkable). He was lost to follow-up after that point. Yesterday [02/29/2020] the patient presented to Urgent Care with poorly controlled pain. This was felt to be mussculoskeletal and he was started on a medrol dose-pak and robaxin. However his pain worsened and he  presented to the ED at [facility] where vitals and exam were unremarkable but labs showed a creatinine of 1.84 (baseline 1.0), calcium 12.7 with albumin 2.8 and total protein 10.4 (globulin fraction 7.8). CT of the abdomen obtained for evaluation of his abdominal and back pain showed no hydronephrosis but multiple lytic lesons. We were consulted for further evaluation and treatment of likely multiple myeloma and he was admitted to the hospital.   The patient's subsequent history is as detailed below.   PAST MEDICAL HISTORY: Past Medical History:  Diagnosis Date  . Enlarged prostate   . Hypertension     PAST SURGICAL HISTORY: Past Surgical History:  Procedure Laterality Date  . EYE SURGERY    . PROSTATE SURGERY      FAMILY HISTORY Family History  Problem Relation Age of Onset  . Hypertension Mother     SOCIAL HISTORY:  Retired, used to work for the [employer] Lives with wife [place]. Has 4 children, 2 in [place], one in [place], one in [place]; 5 grandchildren and 5 great-grandchildren. Attands a [employer].    ADVANCED DIRECTIVES: at the initial visit the patient confirmed his wife is his HCPOA (despite her memory issues).   HEALTH MAINTENANCE: Social History   Tobacco Use  . Smoking status: Never Smoker  . Smokeless tobacco: Never Used  Substance Use Topics  . Alcohol use: Never  . Drug use: Never     Colonoscopy: 6-8 years ago     No Known Allergies  Current Outpatient Medications  Medication Sig Dispense Refill  . acetaminophen (TYLENOL) 500 MG tablet Take 1,000 mg by mouth every 6 (six) hours as needed for mild pain.    [name] acyclovir (ZOVIRAX) 400 MG tablet Take 1 tablet (400 mg total) by mouth 2 (two) times daily. 60 tablet 3  . amLODipine (NORVASC) 5 MG tablet Take 5 mg by mouth daily.    [name] doxazosin (CARDURA) 4 MG tablet Take 4 mg by mouth at bedtime.    . famotidine (PEPCID) 20 MG tablet Take 20 mg by mouth daily.     . finasteride (PROSCAR) 5 MG tablet Take 1 tablet (5 mg total) by mouth daily. 30 tablet 3  . indapamide (LOZOL) 2.5 MG tablet Take 2.5 mg by mouth daily.     . magnesium citrate SOLN Take 1/2 bottle if no bowel movement in 2 hours repeat 195 mL 1  . methocarbamol (ROBAXIN) 500 MG tablet Take 1 tablet (500 mg total) by mouth 2 (two) times daily. 30 tablet 0  . ondansetron (ZOFRAN) 8 MG tablet Take 1 tablet (8 mg total) by mouth every 8 (eight) hours as needed for nausea or vomiting. 20 tablet 1  . polyethylene glycol powder (MIRALAX) 17 GM/SCOOP powder Take 255 g by mouth daily. 255 g 0  . pravastatin (PRAVACHOL) 40 MG tablet Take 40 mg by mouth daily.    . predniSONE (DELTASONE) 20 MG tablet Take 1 tablet (20 mg total) by mouth daily with breakfast. 7 tablet 0  . prochlorperazine (COMPAZINE) 10 MG tablet Take 1 tablet (10 mg total) by mouth every 6 (six) hours as needed (Nausea or vomiting). 30 tablet 1  . senna-docusate (SENOKOT-S) 8.6-50 MG tablet Take 2 tablets by mouth 2 (two) times daily. 120 tablet 0  . traMADol (ULTRAM) 50 MG tablet Take 1 tablet (50 mg total) by mouth every 8 (eight) hours as needed for severe pain. 60 tablet 0  . vitamin B-12 (CYANOCOBALAMIN) 1000 MCG tablet Take 1,000 mcg by mouth daily.     No current facility-administered medications for this visit.    OBJECTIVE: African-American man who appears well  Vitals:   04/10/20 1153  BP: 140/72  Pulse: 89  Resp: 18  Temp: 98.5 F (36.9 C)  SpO2: 100%     Body mass index is 27.38 kg/m.   Wt Readings from Last 3 Encounters:  04/10/20 196 lb 4.8 oz (89 kg)  03/20/20 193 lb 8 oz (87.8 kg)  03/01/20 205 lb (93 kg)      ECOG FS:1 - Symptomatic but completely ambulatory  Sclerae unicteric, EOMs intact As of full lower plate, partial plate No cervical or supraclavicular adenopathy Lungs no rales or rhonchi Heart regular rate and rhythm Abd soft, nontender, positive bowel sounds MSK no focal spinal  tenderness, no upper extremity lymphedema Neuro: nonfocal, well oriented, positive affect    LAB RESULTS:  CMP     Component Value Date/Time   NA 135 03/27/2020 1323   NA 140 01/09/2016 1045   K 3.3 (L) 03/27/2020 1323   K 3.8 01/09/2016 1045   CL 99 03/27/2020 1323   CO2 27 03/27/2020 1323   CO2 27 01/09/2016 1045   GLUCOSE 136 (H) 03/27/2020 1323   GLUCOSE 89 01/09/2016 1045   BUN 15 03/27/2020 1323   BUN 14.7 01/09/2016 1045   CREATININE 1.05 03/27/2020 1323   CREATININE 1.0 01/09/2016 1045   CALCIUM 8.8 (L) 03/27/2020 1323   CALCIUM 9.2 01/09/2016 1045   PROT  7.7 03/27/2020 1323   PROT 7.3 01/09/2016 1045   ALBUMIN 3.0 (L) 03/27/2020 1323   ALBUMIN 3.7 01/09/2016 1045   AST 15 03/27/2020 1323   AST 18 01/09/2016 1045   ALT 20 03/27/2020 1323   ALT 15 01/09/2016 1045   ALKPHOS 211 (H) 03/27/2020 1323   ALKPHOS 60 01/09/2016 1045   BILITOT 0.4 03/27/2020 1323   BILITOT 0.51 01/09/2016 1045   GFRNONAA >60 03/27/2020 1323   GFRAA >60 03/27/2020 1323    Lab Results  Component Value Date   TOTALPROTELP 9.8 (H) 03/01/2020   ALBUMINELP 56.1 07/12/2012   A1GS 3.5 07/12/2012   A2GS 9.8 07/12/2012   BETS 5.1 07/12/2012   BETA2SER 3.6 07/12/2012   GAMS 21.9 (H) 07/12/2012   MSPIKE 0.87 07/12/2012   SPEI * 07/12/2012     Lab Results  Component Value Date   KPAFRELGTCHN 690.6 (H) 03/01/2020   LAMBDASER 5.9 03/01/2020   KAPLAMBRATIO 117.05 (H) 03/01/2020    Lab Results  Component Value Date   WBC 5.2 04/10/2020   NEUTROABS 3.4 04/10/2020   HGB 9.7 (L) 04/10/2020   HCT 31.3 (L) 04/10/2020   MCV 83.2 04/10/2020   PLT 226 04/10/2020      Chemistry      Component Value Date/Time   NA 135 03/27/2020 1323   NA 140 01/09/2016 1045   K 3.3 (L) 03/27/2020 1323   K 3.8 01/09/2016 1045   CL 99 03/27/2020 1323   CO2 27 03/27/2020 1323   CO2 27 01/09/2016 1045   BUN 15 03/27/2020 1323   BUN 14.7 01/09/2016 1045   CREATININE 1.05 03/27/2020 1323    CREATININE 1.0 01/09/2016 1045      Component Value Date/Time   CALCIUM 8.8 (L) 03/27/2020 1323   CALCIUM 9.2 01/09/2016 1045   ALKPHOS 211 (H) 03/27/2020 1323   ALKPHOS 60 01/09/2016 1045   AST 15 03/27/2020 1323   AST 18 01/09/2016 1045   ALT 20 03/27/2020 1323   ALT 15 01/09/2016 1045   BILITOT 0.4 03/27/2020 1323   BILITOT 0.51 01/09/2016 1045       No results found for: LABCA2  No components found for: ZGYFVC944  No results for input(s): INR in the last 168 hours.  No results found for: LABCA2  No results found for: HQP591  No results found for: MBW466  No results found for: ZLD357  No results found for: CA2729  No components found for: HGQUANT  No results found for: CEA1 / No results found for: CEA1   No results found for: AFPTUMOR  No results found for: CHROMOGRNA  No results found for: HGBA, HGBA2QUANT, HGBFQUANT, HGBSQUAN (Hemoglobinopathy evaluation)   Lab Results  Component Value Date   LDH 136 01/09/2016    Lab Results  Component Value Date   IRON 49 03/01/2020   TIBC 238 (L) 03/01/2020   IRONPCTSAT 21 03/01/2020   (Iron and TIBC)  Lab Results  Component Value Date   FERRITIN 375 (H) 03/01/2020    Urinalysis    Component Value Date/Time   COLORURINE AMBER (A) 03/01/2020 1052   APPEARANCEUR CLOUDY (A) 03/01/2020 1052   LABSPEC 1.021 03/01/2020 1052   PHURINE 5.0 03/01/2020 1052   GLUCOSEU NEGATIVE 03/01/2020 1052   HGBUR NEGATIVE 03/01/2020 [place] 03/01/2020 1052   KETONESUR NEGATIVE 03/01/2020 1052   PROTEINUR 100 (A) 03/01/2020 1052   UROBILINOGEN 0.2 02/29/2020 1537   NITRITE NEGATIVE 03/01/2020 [place] 03/01/2020 1052  STUDIES: No results found.   ELIGIBLE FOR AVAILABLE RESEARCH PROTOCOL:   ASSESSMENT: 86 y.o.  50 [place] man with a history of M-GUS, presenting 02/29/2020 with a high globulin fraction, worsening renal function, hypercalcemia, anemia and multiple lytic  bone lesions.   (1) IgG Kappa Multiple Myeloma: Labs diagnostic on 03/01/2020 with Mspike of 3.8, Kappa free light chain 690.6, and ratio 117.05.  (a) CT A/P on 03/01/2020 shows "innumerable" lytic lesions involving all visualized bones  (b) bone marrow biopsy 03/11/2020 shows 20% plasmacytosis by CD138, 12% by manual differential; molecular studies not requested  (c) Bortezomib and Dexamethasone given weekly 3 weeks on and 1 week off beginning 03/11/2020    (2) lytic bone lesions/ hypercalcemia:  (a) Pamidronate administered on 03/01/2020-- resolved  (b) denosumab/Xgeva to start 04/17/2020-- repeat every 12 weeks   (a) take TUMS 3 tabs on day of Xgeva dose   PLAN: Joe [name] is having a good initial response to his myeloma treatment.  He is renal function has normalized.  He is no longer hypercalcemic in fact his calcium level is a bit on the low side.  His anemia is stable.  This is all very favorable.  I am going to continue the current treatment, which he is tolerating well, to maximal response, at which time I plan to switch him to lenalidomide maintenance.  We discussed adding denosumab/Xgeva today.  He has a full lower set of teeth and only a few upper teeth are really his.  I have asked him to reestablish himself with a dentist and to avoid extractions.  We are going to use the denosumab only every 12weeks in his case.  At this point I am very pleased with his progress.  He knows to call us for any other issue that may develop before the next visit  Total encounter time 35 minutes.Sarajane Jews C. Errika Narvaiz, MD  04/10/2020 12:13 PM Medical Oncology and Hematology [facility] [place], [place] [address] Tel. [phone]    Fax. [phone]   I, Wilburn Mylar, am acting as scribe for Dr. Virgie Dad. Bryant Saye.  I, Lurline Del MD, have reviewed the above documentation for accuracy and completeness, and I agree with the above.    *Total Encounter Time  as defined by the Centers for Medicare and Medicaid Services includes, in addition to the face-to-face time of a patient visit (documented in the note above) non-face-to-face time: obtaining and reviewing outside history, ordering and reviewing medications, tests or procedures, care coordination (communications with other health care professionals or caregivers) and documentation in the medical record.

## 2020-04-10 ENCOUNTER — Inpatient Hospital Stay: Payer: Medicare Other

## 2020-04-10 ENCOUNTER — Inpatient Hospital Stay (HOSPITAL_BASED_OUTPATIENT_CLINIC_OR_DEPARTMENT_OTHER): Payer: Medicare Other | Admitting: Oncology

## 2020-04-10 ENCOUNTER — Other Ambulatory Visit: Payer: Self-pay

## 2020-04-10 ENCOUNTER — Inpatient Hospital Stay: Payer: Medicare Other | Attending: Adult Health

## 2020-04-10 VITALS — BP 140/72 | HR 89 | Temp 98.5°F | Resp 18 | Ht 71.0 in | Wt 196.3 lb

## 2020-04-10 DIAGNOSIS — Z5112 Encounter for antineoplastic immunotherapy: Secondary | ICD-10-CM | POA: Insufficient documentation

## 2020-04-10 DIAGNOSIS — D649 Anemia, unspecified: Secondary | ICD-10-CM | POA: Diagnosis not present

## 2020-04-10 DIAGNOSIS — C9000 Multiple myeloma not having achieved remission: Secondary | ICD-10-CM

## 2020-04-10 DIAGNOSIS — N179 Acute kidney failure, unspecified: Secondary | ICD-10-CM

## 2020-04-10 DIAGNOSIS — Z7189 Other specified counseling: Secondary | ICD-10-CM | POA: Diagnosis not present

## 2020-04-10 LAB — CMP (CANCER CENTER ONLY)
ALT: 16 U/L (ref 0–44)
AST: 18 U/L (ref 15–41)
Albumin: 3.2 g/dL — ABNORMAL LOW (ref 3.5–5.0)
Alkaline Phosphatase: 233 U/L — ABNORMAL HIGH (ref 38–126)
Anion gap: 9 (ref 5–15)
BUN: 14 mg/dL (ref 8–23)
CO2: 31 mmol/L (ref 22–32)
Calcium: 9 mg/dL (ref 8.9–10.3)
Chloride: 99 mmol/L (ref 98–111)
Creatinine: 0.95 mg/dL (ref 0.61–1.24)
GFR, Est AFR Am: >60 mL/min (ref >60)
GFR, Estimated: >60 mL/min (ref >60)
Glucose, Bld: 112 mg/dL — ABNORMAL HIGH (ref 70–99)
Potassium: 3.4 mmol/L — ABNORMAL LOW (ref 3.5–5.1)
Sodium: 139 mmol/L (ref 135–145)
Total Bilirubin: 0.5 mg/dL (ref 0.3–1.2)
Total Protein: 7.3 g/dL (ref 6.5–8.1)

## 2020-04-10 LAB — CBC WITH DIFFERENTIAL (CANCER CENTER ONLY)
Abs Immature Granulocytes: 0.01 10*3/uL (ref 0.00–0.07)
Basophils Absolute: 0 10*3/uL (ref 0.0–0.1)
Basophils Relative: 0 %
Eosinophils Absolute: 0.1 10*3/uL (ref 0.0–0.5)
Eosinophils Relative: 1 %
HCT: 31.3 % — ABNORMAL LOW (ref 39.0–52.0)
Hemoglobin: 9.7 g/dL — ABNORMAL LOW (ref 13.0–17.0)
Immature Granulocytes: 0 %
Lymphocytes Relative: 21 %
Lymphs Abs: 1.1 10*3/uL (ref 0.7–4.0)
MCH: 25.8 pg — ABNORMAL LOW (ref 26.0–34.0)
MCHC: 31 g/dL (ref 30.0–36.0)
MCV: 83.2 fL (ref 80.0–100.0)
Monocytes Absolute: 0.6 10*3/uL (ref 0.1–1.0)
Monocytes Relative: 12 %
Neutro Abs: 3.4 10*3/uL (ref 1.7–7.7)
Neutrophils Relative %: 66 %
Platelet Count: 226 10*3/uL (ref 150–400)
RBC: 3.76 MIL/uL — ABNORMAL LOW (ref 4.22–5.81)
RDW: 14.6 % (ref 11.5–15.5)
WBC Count: 5.2 10*3/uL (ref 4.0–10.5)
nRBC: 0 % (ref 0.0–0.2)

## 2020-04-10 MED ORDER — PROCHLORPERAZINE MALEATE 10 MG PO TABS
ORAL_TABLET | ORAL | Status: AC
  Filled 2020-04-10: qty 1

## 2020-04-10 MED ORDER — BORTEZOMIB CHEMO SQ INJECTION 3.5 MG (2.5MG/ML)
1.3000 mg/m2 | Freq: Once | INTRAMUSCULAR | Status: AC
  Administered 2020-04-10: 2.75 mg via SUBCUTANEOUS
  Filled 2020-04-10: qty 1.1

## 2020-04-10 MED ORDER — DEXAMETHASONE 4 MG PO TABS
40.0000 mg | ORAL_TABLET | Freq: Once | ORAL | Status: AC
  Administered 2020-04-10: 40 mg via ORAL

## 2020-04-10 MED ORDER — DEXAMETHASONE 4 MG PO TABS
ORAL_TABLET | ORAL | Status: AC
  Filled 2020-04-10: qty 10

## 2020-04-10 MED ORDER — PROCHLORPERAZINE MALEATE 10 MG PO TABS
10.0000 mg | ORAL_TABLET | Freq: Once | ORAL | Status: AC
  Administered 2020-04-10: 10 mg via ORAL

## 2020-04-10 NOTE — Addendum Note (Signed)
Addended by: Chauncey Cruel on: 04/10/2020 12:53 PM   Modules accepted: Orders

## 2020-04-10 NOTE — Patient Instructions (Signed)
[facility] Discharge Instructions for Patients Receiving Chemotherapy  Today you received the following chemotherapy agents: bortezomib.  To help prevent nausea and vomiting after your treatment, we encourage you to take your nausea medication as directed.   If you develop nausea and vomiting that is not controlled by your nausea medication, call the clinic.   BELOW ARE SYMPTOMS THAT SHOULD BE REPORTED IMMEDIATELY:  *FEVER GREATER THAN 100.5 F  *CHILLS WITH OR WITHOUT FEVER  NAUSEA AND VOMITING THAT IS NOT CONTROLLED WITH YOUR NAUSEA MEDICATION  *UNUSUAL SHORTNESS OF BREATH  *UNUSUAL BRUISING OR BLEEDING  TENDERNESS IN MOUTH AND THROAT WITH OR WITHOUT PRESENCE OF ULCERS  *URINARY PROBLEMS  *BOWEL PROBLEMS  UNUSUAL RASH Items with * indicate a potential emergency and should be followed up as soon as possible.  Feel free to call the clinic should you have any questions or concerns. The clinic phone number is [phone].  Please show the CHEMO ALERT CARD at check-in to the Emergency Department and triage nurse.   

## 2020-04-11 ENCOUNTER — Telehealth: Payer: Self-pay | Admitting: Oncology

## 2020-04-11 NOTE — Progress Notes (Signed)
Pharmacist Chemotherapy Monitoring - Follow Up Assessment    I verify that I have reviewed each item in the below checklist:  . Regimen for the patient is scheduled for the appropriate day and plan matches scheduled date. [name] Appropriate non-routine labs are ordered dependent on drug ordered. . If applicable, additional medications reviewed and ordered per protocol based on lifetime cumulative doses and/or treatment regimen.   Plan for follow-up and/or issues identified: No . I-vent associated with next due treatment: No . MD and/or nursing notified: No  Myrel Rappleye D 04/11/2020 10:41 AM

## 2020-04-11 NOTE — Telephone Encounter (Signed)
Scheduled appts per 5/12 los. Called pt to review new appt date and time. Pt didn't answer and voicemail has not been set up yet. Put note in 5/19 appt notes for pt to receive updated appt calendar for July if needed.

## 2020-04-17 ENCOUNTER — Other Ambulatory Visit: Payer: Self-pay

## 2020-04-17 ENCOUNTER — Inpatient Hospital Stay: Payer: Medicare Other

## 2020-04-17 VITALS — BP 150/78 | HR 91 | Temp 97.7°F | Resp 17 | Wt 197.8 lb

## 2020-04-17 DIAGNOSIS — D649 Anemia, unspecified: Secondary | ICD-10-CM | POA: Diagnosis not present

## 2020-04-17 DIAGNOSIS — N179 Acute kidney failure, unspecified: Secondary | ICD-10-CM

## 2020-04-17 DIAGNOSIS — C9000 Multiple myeloma not having achieved remission: Secondary | ICD-10-CM

## 2020-04-17 DIAGNOSIS — Z5112 Encounter for antineoplastic immunotherapy: Secondary | ICD-10-CM | POA: Diagnosis not present

## 2020-04-17 DIAGNOSIS — Z7189 Other specified counseling: Secondary | ICD-10-CM

## 2020-04-17 LAB — CMP (CANCER CENTER ONLY)
ALT: 13 U/L (ref 0–44)
AST: 15 U/L (ref 15–41)
Albumin: 3.2 g/dL — ABNORMAL LOW (ref 3.5–5.0)
Alkaline Phosphatase: 198 U/L — ABNORMAL HIGH (ref 38–126)
Anion gap: 9 (ref 5–15)
BUN: 13 mg/dL (ref 8–23)
CO2: 28 mmol/L (ref 22–32)
Calcium: 8.8 mg/dL — ABNORMAL LOW (ref 8.9–10.3)
Chloride: 102 mmol/L (ref 98–111)
Creatinine: 0.91 mg/dL (ref 0.61–1.24)
GFR, Est AFR Am: >60 mL/min (ref >60)
GFR, Estimated: >60 mL/min (ref >60)
Glucose, Bld: 132 mg/dL — ABNORMAL HIGH (ref 70–99)
Potassium: 3.4 mmol/L — ABNORMAL LOW (ref 3.5–5.1)
Sodium: 139 mmol/L (ref 135–145)
Total Bilirubin: 0.4 mg/dL (ref 0.3–1.2)
Total Protein: 6.7 g/dL (ref 6.5–8.1)

## 2020-04-17 LAB — CBC WITH DIFFERENTIAL (CANCER CENTER ONLY)
Abs Immature Granulocytes: 0.03 10*3/uL (ref 0.00–0.07)
Basophils Absolute: 0 10*3/uL (ref 0.0–0.1)
Basophils Relative: 0 %
Eosinophils Absolute: 0.1 10*3/uL (ref 0.0–0.5)
Eosinophils Relative: 2 %
HCT: 31.4 % — ABNORMAL LOW (ref 39.0–52.0)
Hemoglobin: 9.6 g/dL — ABNORMAL LOW (ref 13.0–17.0)
Immature Granulocytes: 1 %
Lymphocytes Relative: 26 %
Lymphs Abs: 1.5 10*3/uL (ref 0.7–4.0)
MCH: 25.2 pg — ABNORMAL LOW (ref 26.0–34.0)
MCHC: 30.6 g/dL (ref 30.0–36.0)
MCV: 82.4 fL (ref 80.0–100.0)
Monocytes Absolute: 0.7 10*3/uL (ref 0.1–1.0)
Monocytes Relative: 12 %
Neutro Abs: 3.3 10*3/uL (ref 1.7–7.7)
Neutrophils Relative %: 59 %
Platelet Count: 204 10*3/uL (ref 150–400)
RBC: 3.81 MIL/uL — ABNORMAL LOW (ref 4.22–5.81)
RDW: 14.8 % (ref 11.5–15.5)
WBC Count: 5.5 10*3/uL (ref 4.0–10.5)
nRBC: 0 % (ref 0.0–0.2)

## 2020-04-17 MED ORDER — BORTEZOMIB CHEMO SQ INJECTION 3.5 MG (2.5MG/ML)
1.3000 mg/m2 | Freq: Once | INTRAMUSCULAR | Status: AC
  Administered 2020-04-17: 2.75 mg via SUBCUTANEOUS
  Filled 2020-04-17: qty 1.1

## 2020-04-17 MED ORDER — DEXAMETHASONE 4 MG PO TABS
40.0000 mg | ORAL_TABLET | Freq: Once | ORAL | Status: AC
  Administered 2020-04-17: 40 mg via ORAL

## 2020-04-17 MED ORDER — PROCHLORPERAZINE MALEATE 10 MG PO TABS
10.0000 mg | ORAL_TABLET | Freq: Once | ORAL | Status: AC
  Administered 2020-04-17: 10 mg via ORAL

## 2020-04-17 MED ORDER — PROCHLORPERAZINE MALEATE 10 MG PO TABS
ORAL_TABLET | ORAL | Status: AC
  Filled 2020-04-17: qty 1

## 2020-04-17 NOTE — Patient Instructions (Signed)
[facility] Discharge Instructions for Patients Receiving Chemotherapy  Today you received the following chemotherapy agents: Velacde  To help prevent nausea and vomiting after your treatment, we encourage you to take your nausea medication as directed.   If you develop nausea and vomiting that is not controlled by your nausea medication, call the clinic.   BELOW ARE SYMPTOMS THAT SHOULD BE REPORTED IMMEDIATELY:  *FEVER GREATER THAN 100.5 F  *CHILLS WITH OR WITHOUT FEVER  NAUSEA AND VOMITING THAT IS NOT CONTROLLED WITH YOUR NAUSEA MEDICATION  *UNUSUAL SHORTNESS OF BREATH  *UNUSUAL BRUISING OR BLEEDING  TENDERNESS IN MOUTH AND THROAT WITH OR WITHOUT PRESENCE OF ULCERS  *URINARY PROBLEMS  *BOWEL PROBLEMS  UNUSUAL RASH Items with * indicate a potential emergency and should be followed up as soon as possible.  Feel free to call the clinic should you have any questions or concerns. The clinic phone number is [phone].  Please show the [facility] at check-in to the Emergency Department and triage nurse.

## 2020-04-18 LAB — KAPPA/LAMBDA LIGHT CHAINS
Kappa free light chain: 46.6 mg/L — ABNORMAL HIGH (ref 3.3–19.4)
Kappa, lambda light chain ratio: 7.06 — ABNORMAL HIGH (ref 0.26–1.65)
Lambda free light chains: 6.6 mg/L (ref 5.7–26.3)

## 2020-04-18 NOTE — Progress Notes (Signed)
Pharmacist Chemotherapy Monitoring - Follow Up Assessment    I verify that I have reviewed each item in the below checklist:  . Regimen for the patient is scheduled for the appropriate day and plan matches scheduled date. [name] Appropriate non-routine labs are ordered dependent on drug ordered. . If applicable, additional medications reviewed and ordered per protocol based on lifetime cumulative doses and/or treatment regimen.   Plan for follow-up and/or issues identified: Yes . I-vent associated with next due treatment: Yes . MD and/or nursing notified: No  Britt Boozer 04/18/2020 8:57 AM

## 2020-04-22 LAB — MULTIPLE MYELOMA PANEL, SERUM
Albumin SerPl Elph-Mcnc: 3.4 g/dL (ref 2.9–4.4)
Albumin/Glob SerPl: 1.1 (ref 0.7–1.7)
Alpha 1: 0.2 g/dL (ref 0.0–0.4)
Alpha2 Glob SerPl Elph-Mcnc: 0.6 g/dL (ref 0.4–1.0)
B-Globulin SerPl Elph-Mcnc: 0.8 g/dL (ref 0.7–1.3)
Gamma Glob SerPl Elph-Mcnc: 1.4 g/dL (ref 0.4–1.8)
Globulin, Total: 3.1 g/dL (ref 2.2–3.9)
IgA: 39 mg/dL — ABNORMAL LOW (ref 61–437)
IgG (Immunoglobin G), Serum: 1614 mg/dL — ABNORMAL HIGH (ref 603–1613)
IgM (Immunoglobulin M), Srm: 34 mg/dL (ref 15–143)
M Protein SerPl Elph-Mcnc: 1.1 g/dL — ABNORMAL HIGH
Total Protein ELP: 6.5 g/dL (ref 6.0–8.5)

## 2020-04-24 ENCOUNTER — Inpatient Hospital Stay: Payer: Medicare Other

## 2020-04-24 ENCOUNTER — Other Ambulatory Visit: Payer: Self-pay

## 2020-04-24 VITALS — BP 153/79 | HR 70 | Temp 98.5°F | Resp 20 | Wt 197.2 lb

## 2020-04-24 DIAGNOSIS — D649 Anemia, unspecified: Secondary | ICD-10-CM | POA: Diagnosis not present

## 2020-04-24 DIAGNOSIS — Z5112 Encounter for antineoplastic immunotherapy: Secondary | ICD-10-CM | POA: Diagnosis not present

## 2020-04-24 DIAGNOSIS — C9000 Multiple myeloma not having achieved remission: Secondary | ICD-10-CM | POA: Diagnosis not present

## 2020-04-24 DIAGNOSIS — Z7189 Other specified counseling: Secondary | ICD-10-CM

## 2020-04-24 LAB — CBC WITH DIFFERENTIAL (CANCER CENTER ONLY)
Abs Immature Granulocytes: 0.02 10*3/uL (ref 0.00–0.07)
Basophils Absolute: 0 10*3/uL (ref 0.0–0.1)
Basophils Relative: 0 %
Eosinophils Absolute: 0.1 10*3/uL (ref 0.0–0.5)
Eosinophils Relative: 1 %
HCT: 31.5 % — ABNORMAL LOW (ref 39.0–52.0)
Hemoglobin: 10 g/dL — ABNORMAL LOW (ref 13.0–17.0)
Immature Granulocytes: 0 %
Lymphocytes Relative: 20 %
Lymphs Abs: 1.2 10*3/uL (ref 0.7–4.0)
MCH: 25.6 pg — ABNORMAL LOW (ref 26.0–34.0)
MCHC: 31.7 g/dL (ref 30.0–36.0)
MCV: 80.8 fL (ref 80.0–100.0)
Monocytes Absolute: 0.8 10*3/uL (ref 0.1–1.0)
Monocytes Relative: 14 %
Neutro Abs: 3.9 10*3/uL (ref 1.7–7.7)
Neutrophils Relative %: 65 %
Platelet Count: 163 10*3/uL (ref 150–400)
RBC: 3.9 MIL/uL — ABNORMAL LOW (ref 4.22–5.81)
RDW: 14.9 % (ref 11.5–15.5)
WBC Count: 6 10*3/uL (ref 4.0–10.5)
nRBC: 0.3 % — ABNORMAL HIGH (ref 0.0–0.2)

## 2020-04-24 LAB — CMP (CANCER CENTER ONLY)
ALT: 11 U/L (ref 0–44)
AST: 16 U/L (ref 15–41)
Albumin: 3.2 g/dL — ABNORMAL LOW (ref 3.5–5.0)
Alkaline Phosphatase: 146 U/L — ABNORMAL HIGH (ref 38–126)
Anion gap: 7 (ref 5–15)
BUN: 13 mg/dL (ref 8–23)
CO2: 30 mmol/L (ref 22–32)
Calcium: 8.8 mg/dL — ABNORMAL LOW (ref 8.9–10.3)
Chloride: 103 mmol/L (ref 98–111)
Creatinine: 0.86 mg/dL (ref 0.61–1.24)
GFR, Est AFR Am: >60 mL/min (ref >60)
GFR, Estimated: >60 mL/min (ref >60)
Glucose, Bld: 107 mg/dL — ABNORMAL HIGH (ref 70–99)
Potassium: 3.5 mmol/L (ref 3.5–5.1)
Sodium: 140 mmol/L (ref 135–145)
Total Bilirubin: 0.5 mg/dL (ref 0.3–1.2)
Total Protein: 6.5 g/dL (ref 6.5–8.1)

## 2020-04-24 MED ORDER — PROCHLORPERAZINE MALEATE 10 MG PO TABS
ORAL_TABLET | ORAL | Status: AC
  Filled 2020-04-24: qty 1

## 2020-04-24 MED ORDER — DENOSUMAB 120 MG/1.7ML ~~LOC~~ SOLN
SUBCUTANEOUS | Status: AC
  Filled 2020-04-24: qty 1.7

## 2020-04-24 MED ORDER — DEXAMETHASONE 4 MG PO TABS
ORAL_TABLET | ORAL | Status: AC
  Filled 2020-04-24: qty 10

## 2020-04-24 MED ORDER — DENOSUMAB 120 MG/1.7ML ~~LOC~~ SOLN
120.0000 mg | Freq: Once | SUBCUTANEOUS | Status: AC
  Administered 2020-04-24: 120 mg via SUBCUTANEOUS

## 2020-04-24 MED ORDER — BORTEZOMIB CHEMO SQ INJECTION 3.5 MG (2.5MG/ML)
1.3000 mg/m2 | Freq: Once | INTRAMUSCULAR | Status: AC
  Administered 2020-04-24: 2.75 mg via SUBCUTANEOUS
  Filled 2020-04-24: qty 1.1

## 2020-04-24 MED ORDER — DEXAMETHASONE 4 MG PO TABS
40.0000 mg | ORAL_TABLET | Freq: Once | ORAL | Status: AC
  Administered 2020-04-24: 40 mg via ORAL

## 2020-04-24 MED ORDER — PROCHLORPERAZINE MALEATE 10 MG PO TABS
10.0000 mg | ORAL_TABLET | Freq: Once | ORAL | Status: AC
  Administered 2020-04-24: 10 mg via ORAL

## 2020-04-24 NOTE — Patient Instructions (Addendum)
[facility] Discharge Instructions for Patients Receiving Chemotherapy  Today you received the following chemotherapy agent: Velcade  To help prevent nausea and vomiting after your treatment, we encourage you to take your nausea medication as directed by your MD.   If you develop nausea and vomiting that is not controlled by your nausea medication, call the clinic.   BELOW ARE SYMPTOMS THAT SHOULD BE REPORTED IMMEDIATELY:  *FEVER GREATER THAN 100.5 F  *CHILLS WITH OR WITHOUT FEVER  NAUSEA AND VOMITING THAT IS NOT CONTROLLED WITH YOUR NAUSEA MEDICATION  *UNUSUAL SHORTNESS OF BREATH  *UNUSUAL BRUISING OR BLEEDING  TENDERNESS IN MOUTH AND THROAT WITH OR WITHOUT PRESENCE OF ULCERS  *URINARY PROBLEMS  *BOWEL PROBLEMS  UNUSUAL RASH Items with * indicate a potential emergency and should be followed up as soon as possible.  Feel free to call the clinic should you have any questions or concerns. The clinic phone number is [phone].  Please show the [facility] at check-in to the Emergency Department and triage nurse.  Bortezomib injection What is this medicine? BORTEZOMIB (bor TEZ oh mib) is a medicine that targets proteins in cancer cells and stops the cancer cells from growing. It is used to treat multiple myeloma and mantle-cell lymphoma. This medicine may be used for other purposes; ask your health care provider or pharmacist if you have questions. COMMON BRAND NAME(S): Velcade What should I tell my health care provider before I take this medicine? They need to know if you have any of these conditions:  diabetes  heart disease  irregular heartbeat  liver disease  on hemodialysis  low blood counts, like low white blood cells, platelets, or hemoglobin  peripheral neuropathy  taking medicine for blood pressure  an unusual or allergic reaction to bortezomib, mannitol, boron, other medicines, foods, dyes, or preservatives  pregnant or trying  to get pregnant  breast-feeding How should I use this medicine? This medicine is for injection into a vein or for injection under the skin. It is given by a health care professional in a hospital or clinic setting. Talk to your pediatrician regarding the use of this medicine in children. Special care may be needed. Overdosage: If you think you have taken too much of this medicine contact a poison control center or emergency room at once. NOTE: This medicine is only for you. Do not share this medicine with others. What if I miss a dose? It is important not to miss your dose. Call your doctor or health care professional if you are unable to keep an appointment. What may interact with this medicine? This medicine may interact with the following medications:  ketoconazole  rifampin  ritonavir  St. John's Wort This list may not describe all possible interactions. Give your health care provider a list of all the medicines, herbs, non-prescription drugs, or dietary supplements you use. Also tell them if you smoke, drink alcohol, or use illegal drugs. Some items may interact with your medicine. What should I watch for while using this medicine? You may get drowsy or dizzy. Do not drive, use machinery, or do anything that needs mental alertness until you know how this medicine affects you. Do not stand or sit up quickly, especially if you are an older patient. This reduces the risk of dizzy or fainting spells. In some cases, you may be given additional medicines to help with side effects. Follow all directions for their use. Call your doctor or health care professional for advice if you  get a fever, chills or sore throat, or other symptoms of a cold or flu. Do not treat yourself. This drug decreases your body's ability to fight infections. Try to avoid being around people who are sick. This medicine may increase your risk to bruise or bleed. Call your doctor or health care professional if you notice  any unusual bleeding. You may need blood work done while you are taking this medicine. In some patients, this medicine may cause a serious brain infection that may cause death. If you have any problems seeing, thinking, speaking, walking, or standing, tell your doctor right away. If you cannot reach your doctor, urgently seek other source of medical care. Check with your doctor or health care professional if you get an attack of severe diarrhea, nausea and vomiting, or if you sweat a lot. The loss of too much body fluid can make it dangerous for you to take this medicine. Do not become pregnant while taking this medicine or for at least 7 months after stopping it. Women should inform their doctor if they wish to become pregnant or think they might be pregnant. Men should not father a child while taking this medicine and for at least 4 months after stopping it. There is a potential for serious side effects to an unborn child. Talk to your health care professional or pharmacist for more information. Do not breast-feed an infant while taking this medicine or for 2 months after stopping it. This medicine may interfere with the ability to have a child. You should talk with your doctor or health care professional if you are concerned about your fertility. What side effects may I notice from receiving this medicine? Side effects that you should report to your doctor or health care professional as soon as possible:  allergic reactions like skin rash, itching or hives, swelling of the face, lips, or tongue  breathing problems  changes in hearing  changes in vision  fast, irregular heartbeat  feeling faint or lightheaded, falls  pain, tingling, numbness in the hands or feet  right upper belly pain  seizures  swelling of the ankles, feet, hands  unusual bleeding or bruising  unusually weak or tired  vomiting  yellowing of the eyes or skin Side effects that usually do not require medical  attention (report to your doctor or health care professional if they continue or are bothersome):  changes in emotions or moods  constipation  diarrhea  loss of appetite  headache  irritation at site where injected  nausea This list may not describe all possible side effects. Call your doctor for medical advice about side effects. You may report side effects to FDA at 1-800-FDA-1088. Where should I keep my medicine? This drug is given in a hospital or clinic and will not be stored at home. NOTE: This sheet is a summary. It may not cover all possible information. If you have questions about this medicine, talk to your doctor, pharmacist, or health care provider.  2020 Elsevier/Gold Standard (2018-03-28 16:29:31)

## 2020-04-24 NOTE — Progress Notes (Signed)
Per Dr. Jana Hakim ok to proceed with Delton See today with calcium 8.8. Instructed pt. to take 3 Tums tonight.

## 2020-04-25 ENCOUNTER — Other Ambulatory Visit: Payer: Self-pay | Admitting: Oncology

## 2020-04-25 ENCOUNTER — Telehealth: Payer: Self-pay | Admitting: *Deleted

## 2020-04-25 NOTE — Telephone Encounter (Signed)
This RN received call from the pt's dentist Dr Johnnette Litter who stated pt is cleared per dental evaluation.  Note dentist is Dr Fidel Levy at [phone].  This note will be sent to MD for communication- no further needs.

## 2020-05-01 ENCOUNTER — Other Ambulatory Visit: Payer: Self-pay | Admitting: Oncology

## 2020-05-02 NOTE — Progress Notes (Signed)
Pharmacist Chemotherapy Monitoring - Follow Up Assessment    I verify that I have reviewed each item in the below checklist:  . Regimen for the patient is scheduled for the appropriate day and plan matches scheduled date. [name] Appropriate non-routine labs are ordered dependent on drug ordered. . If applicable, additional medications reviewed and ordered per protocol based on lifetime cumulative doses and/or treatment regimen.   Plan for follow-up and/or issues identified: No . I-vent associated with next due treatment: No . MD and/or nursing notified: No  Philomena Course 05/02/2020 10:09 AM

## 2020-05-06 ENCOUNTER — Other Ambulatory Visit: Payer: Medicare Other

## 2020-05-06 ENCOUNTER — Ambulatory Visit: Payer: Medicare Other

## 2020-05-06 ENCOUNTER — Ambulatory Visit: Payer: Medicare Other | Admitting: Oncology

## 2020-05-08 ENCOUNTER — Inpatient Hospital Stay: Payer: Medicare Other

## 2020-05-08 ENCOUNTER — Other Ambulatory Visit: Payer: Self-pay

## 2020-05-08 ENCOUNTER — Inpatient Hospital Stay (HOSPITAL_BASED_OUTPATIENT_CLINIC_OR_DEPARTMENT_OTHER): Payer: Medicare Other | Admitting: Adult Health

## 2020-05-08 ENCOUNTER — Encounter: Payer: Self-pay | Admitting: Adult Health

## 2020-05-08 ENCOUNTER — Inpatient Hospital Stay: Payer: Medicare Other | Attending: Adult Health

## 2020-05-08 VITALS — BP 140/62 | HR 88 | Temp 98.5°F | Resp 18 | Ht 71.0 in | Wt 196.6 lb

## 2020-05-08 DIAGNOSIS — C9000 Multiple myeloma not having achieved remission: Secondary | ICD-10-CM

## 2020-05-08 DIAGNOSIS — D649 Anemia, unspecified: Secondary | ICD-10-CM | POA: Diagnosis not present

## 2020-05-08 DIAGNOSIS — Z5112 Encounter for antineoplastic immunotherapy: Secondary | ICD-10-CM | POA: Diagnosis not present

## 2020-05-08 LAB — CMP (CANCER CENTER ONLY)
ALT: 11 U/L (ref 0–44)
AST: 15 U/L (ref 15–41)
Albumin: 3.4 g/dL — ABNORMAL LOW (ref 3.5–5.0)
Alkaline Phosphatase: 137 U/L — ABNORMAL HIGH (ref 38–126)
Anion gap: 10 (ref 5–15)
BUN: 12 mg/dL (ref 8–23)
CO2: 28 mmol/L (ref 22–32)
Calcium: 8.7 mg/dL — ABNORMAL LOW (ref 8.9–10.3)
Chloride: 102 mmol/L (ref 98–111)
Creatinine: 1.05 mg/dL (ref 0.61–1.24)
GFR, Est AFR Am: >60 mL/min (ref >60)
GFR, Estimated: >60 mL/min (ref >60)
Glucose, Bld: 122 mg/dL — ABNORMAL HIGH (ref 70–99)
Potassium: 3.1 mmol/L — ABNORMAL LOW (ref 3.5–5.1)
Sodium: 140 mmol/L (ref 135–145)
Total Bilirubin: 0.6 mg/dL (ref 0.3–1.2)
Total Protein: 6.9 g/dL (ref 6.5–8.1)

## 2020-05-08 LAB — CBC WITH DIFFERENTIAL (CANCER CENTER ONLY)
Abs Immature Granulocytes: 0.01 10*3/uL (ref 0.00–0.07)
Basophils Absolute: 0 10*3/uL (ref 0.0–0.1)
Basophils Relative: 0 %
Eosinophils Absolute: 0.2 10*3/uL (ref 0.0–0.5)
Eosinophils Relative: 2 %
HCT: 32.1 % — ABNORMAL LOW (ref 39.0–52.0)
Hemoglobin: 10 g/dL — ABNORMAL LOW (ref 13.0–17.0)
Immature Granulocytes: 0 %
Lymphocytes Relative: 21 %
Lymphs Abs: 1.3 10*3/uL (ref 0.7–4.0)
MCH: 26 pg (ref 26.0–34.0)
MCHC: 31.2 g/dL (ref 30.0–36.0)
MCV: 83.4 fL (ref 80.0–100.0)
Monocytes Absolute: 0.7 10*3/uL (ref 0.1–1.0)
Monocytes Relative: 11 %
Neutro Abs: 4.2 10*3/uL (ref 1.7–7.7)
Neutrophils Relative %: 66 %
Platelet Count: 228 10*3/uL (ref 150–400)
RBC: 3.85 MIL/uL — ABNORMAL LOW (ref 4.22–5.81)
RDW: 14.8 % (ref 11.5–15.5)
WBC Count: 6.4 10*3/uL (ref 4.0–10.5)
nRBC: 0 % (ref 0.0–0.2)

## 2020-05-08 MED ORDER — DEXAMETHASONE 4 MG PO TABS
40.0000 mg | ORAL_TABLET | Freq: Once | ORAL | Status: AC
  Administered 2020-05-08: 40 mg via ORAL

## 2020-05-08 MED ORDER — BORTEZOMIB CHEMO SQ INJECTION 3.5 MG (2.5MG/ML)
1.3000 mg/m2 | Freq: Once | INTRAMUSCULAR | Status: AC
  Administered 2020-05-08: 2.75 mg via SUBCUTANEOUS
  Filled 2020-05-08: qty 1.1

## 2020-05-08 MED ORDER — DEXAMETHASONE 4 MG PO TABS
ORAL_TABLET | ORAL | Status: AC
  Filled 2020-05-08: qty 9

## 2020-05-08 MED ORDER — PROCHLORPERAZINE MALEATE 10 MG PO TABS
10.0000 mg | ORAL_TABLET | Freq: Once | ORAL | Status: AC
  Administered 2020-05-08: 10 mg via ORAL

## 2020-05-08 MED ORDER — DEXAMETHASONE 4 MG PO TABS
ORAL_TABLET | ORAL | Status: AC
  Filled 2020-05-08: qty 1

## 2020-05-08 NOTE — Patient Instructions (Signed)
Potassium Content of Foods  Potassium is a mineral found in many foods and drinks. It affects how the heart works, and helps keep fluids and minerals balanced in the body. The amount of potassium you need each day depends on your age and any medical conditions you may have. Talk to your health care provider or dietitian about how much potassium you need. The following lists of foods provide the general serving size for foods and the approximate amount of potassium in each serving, listed in milligrams (mg). Actual values may vary depending on the product and how it is processed. High in potassium The following foods and beverages have 200 mg or more of potassium per serving:  Apricots (raw) - 2 have 200 mg of potassium.  Apricots (dry) - 5 have 200 mg of potassium.  Artichoke - 1 medium has 345 mg of potassium.  Avocado -  fruit has 245 mg of potassium.  Banana - 1 medium fruit has 425 mg of potassium.  Lima or baked beans (canned) -  cup has 280 mg of potassium.  White beans (canned) -  cup has 595 mg potassium.  Beef roast - 3 oz has 320 mg of potassium.  Ground beef - 3 oz has 270 mg of potassium.  Beets (raw or cooked) -  cup has 260 mg of potassium.  Bran muffin - 2 oz has 300 mg of potassium.  Broccoli (cooked) -  cup has 230 mg of potassium.  Brussels sprouts -  cup has 250 mg of potassium.  Cantaloupe -  cup has 215 mg of potassium.  Cereal, 100% bran -  cup has 200-400 mg of potassium.  Cheeseburger -1 single fast food burger has 225-400 mg of potassium.  Chicken - 3 oz has 220 mg of potassium.  Clams (canned) - 3 oz has 535 mg of potassium.  Crab - 3 oz has 225 mg of potassium.  Dates - 5 have 270 mg of potassium.  Dried beans and peas -  cup has 300-475 mg of potassium.  Figs (dried) - 2 have 260 mg of potassium.  Fish (halibut, tuna, cod, snapper) - 3 oz has 480 mg of potassium.  Fish (salmon, haddock, swordfish, perch) - 3 oz has 300 mg of  potassium.  Fish (tuna, canned) - 3 oz has 200 mg of potassium.  French fries (fast food) - 3 oz has 470 mg of potassium.  Granola with fruit and nuts -  cup has 200 mg of potassium.  Grapefruit juice -  cup has 200 mg of potassium.  Honeydew melon -  cup has 200 mg of potassium.  Kale (raw) - 1 cup has 300 mg of potassium.  Kiwi - 1 medium fruit has 240 mg of potassium.  Kohlrabi, rutabaga, parsnips -  cup has 280 mg of potassium.  Lentils -  cup has 365 mg of potassium.  Mango - 1 each has 325 mg of potassium.  Milk (nonfat, low-fat, whole, buttermilk) - 1 cup has 350-380 mg of potassium.  Milk (chocolate) - 1 cup has 420 mg of potassium  Molasses - 1 Tbsp has 295 mg of potassium.  Mushrooms -  cup has 280 mg of potassium.  Nectarine - 1 each has 275 mg of potassium.  Nuts (almonds, peanuts, hazelnuts, [place], cashew, mixed) - 1 oz has 200 mg of potassium.  Nuts (pistachios) - 1 oz has 295 mg of potassium.  Orange - 1 fruit has 240 mg of potassium.  Orange juice -    cup has 235 mg of potassium.  Papaya -  medium fruit has 390 mg of potassium.  Peanut butter (chunky) - 2 Tbsp has 240 mg of potassium.  Peanut butter (smooth) - 2 Tbsp has 210 mg of potassium.  Pear - 1 medium (200 mg) of potassium.  Pomegranate - 1 whole fruit has 400 mg of potassium.  Pomegranate juice -  cup has 215 mg of potassium.  Pork - 3 oz has 350 mg of potassium.  Potato chips (salted) - 1 oz has 465 mg of potassium.  Potato (baked with skin) - 1 medium has 925 mg of potassium.  Potato (boiled) -  cup has 255 mg of potassium.  Potato (Mashed) -  cup has 330 mg of potassium.  Prune juice -  cup has 370 mg of potassium.  Prunes - 5 have 305 mg of potassium.  Pudding (chocolate) -  cup has 230 mg of potassium.  Pumpkin (canned) -  cup has 250 mg of potassium.  Raisins (seedless) -  cup has 270 mg of potassium.  Seeds (sunflower or pumpkin) - 1 oz has 240 mg of  potassium.  Soy milk - 1 cup has 300 mg of potassium.  Spinach (cooked) - 1/2 cup has 420 mg of potassium.  Spinach (canned) -  cup has 370 mg of potassium.  Sweet potato (baked with skin) - 1 medium has 450 mg of potassium.  Swiss chard -  cup has 480 mg of potassium.  Tomato or vegetable juice -  cup has 275 mg of potassium.  Tomato (sauce or puree) -  cup has 400-550 mg of potassium.  Tomato (raw) - 1 medium has 290 mg of potassium.  Tomato (canned) -  cup has 200-300 mg of potassium.  Turkey - 3 oz has 250 mg of potassium.  Wheat germ - 1 oz has 250 mg of potassium.  Winter squash -  cup has 250 mg of potassium.  Yogurt (plain or fruited) - 6 oz has 260-435 mg of potassium.  Zucchini -  cup has 220 mg of potassium. Moderate in potassium The following foods and beverages have 50-200 mg of potassium per serving:  Apple - 1 fruit has 150 mg of potassium  Apple juice -  cup has 150 mg of potassium  Applesauce -  cup has 90 mg of potassium  Apricot nectar -  cup has 140 mg of potassium  Asparagus (small spears) -  cup has 155 mg of potassium  Asparagus (large spears) - 6 have 155 mg of potassium  Bagel (cinnamon raisin) - 1 four-inch bagel has 130 mg of potassium  Bagel (egg or plain) - 1 four- inch bagel has 70 mg of potassium  Beans (green) -  cup has 90 mg of potassium  Beans (yellow) -  cup has 190 mg of potassium  Beer, regular - 12 oz has 100 mg of potassium  Beets (canned) -  cup has 125 mg of potassium  Blackberries -  cup has 115 mg of potassium  Blueberries -  cup has 60 mg of potassium  Bread (whole wheat) - 1 slice has 70 mg of potassium  Broccoli (raw) -  cup has 145 mg of potassium  Cabbage -  cup has 150 mg of potassium  Carrots (cooked or raw) -  cup has 180 mg of potassium  Cauliflower (raw) -  cup has 150 mg of potassium  Celery (raw) -  cup has 155 mg of potassium  Cereal, bran   flakes -  cup has 120-150 mg  of potassium  Cheese (cottage) -  cup has 110 mg of potassium  Cherries - 10 have 150 mg of potassium  Chocolate - 1 oz bar has 165 mg of potassium  Coffee (brewed) - 6 oz has 90 mg of potassium  Corn -  cup or 1 ear has 195 mg of potassium  Cucumbers -  cup has 80 mg of potassium  Egg - 1 large egg has 60 mg of potassium  Eggplant -  cup has 60 mg of potassium  Endive (raw) -  cup has 80 mg of potassium  English muffin - 1 has 65 mg of potassium  Fish (ocean perch) - 3 oz has 192 mg of potassium  Frankfurter, beef or pork - 1 has 75 mg of potassium  Fruit cocktail -  cup has 115 mg of potassium  Grape juice -  cup has 170 mg of potassium  Grapefruit -  fruit has 175 mg of potassium  Grapes -  cup has 155 mg of potassium  Greens: kale, turnip, collard -  cup has 110-150 mg of potassium  Ice cream or frozen yogurt (chocolate) -  cup has 175 mg of potassium  Ice cream or frozen yogurt (vanilla) -  cup has 120-150 mg of potassium  Lemons, limes - 1 each has 80 mg of potassium  Lettuce - 1 cup has 100 mg of potassium  Mixed vegetables -  cup has 150 mg of potassium  Mushrooms, raw -  cup has 110 mg of potassium  Nuts (walnuts, pecans, or macadamia) - 1 oz has 125 mg of potassium  Oatmeal -  cup has 80 mg of potassium  Okra -  cup has 110 mg of potassium  Onions -  cup has 120 mg of potassium  Peach - 1 has 185 mg of potassium  Peaches (canned) -  cup has 120 mg of potassium  Pears (canned) -  cup has 120 mg of potassium  Peas, green (frozen) -  cup has 90 mg of potassium  Peppers (Green) -  cup has 130 mg of potassium  Peppers (Red) -  cup has 160 mg of potassium  Pineapple juice -  cup has 165 mg of potassium  Pineapple (fresh or canned) -  cup has 100 mg of potassium  Plums - 1 has 105 mg of potassium  Pudding, vanilla -  cup has 150 mg of potassium  Raspberries -  cup has 90 mg of potassium  Rhubarb -  cup has  115 mg of potassium  Rice, wild -  cup has 80 mg of potassium  Shrimp - 3 oz has 155 mg of potassium  Spinach (raw) - 1 cup has 170 mg of potassium  Strawberries -  cup has 125 mg of potassium  Summer squash -  cup has 175-200 mg of potassium  Swiss chard (raw) - 1 cup has 135 mg of potassium  Tangerines - 1 fruit has 140 mg of potassium  Tea, brewed - 6 oz has 65 mg of potassium  Turnips -  cup has 140 mg of potassium  Watermelon -  cup has 85 mg of potassium  Wine (Red, table) - 5 oz has 180 mg of potassium  Wine (White, table) - 5 oz 100 mg of potassium Low in potassium The following foods and beverages have less than 50 mg of potassium per serving.  Bread (white) - 1 slice has 30 mg of potassium    Carbonated beverages - 12 oz has less than 5 mg of potassium  Cheese - 1 oz has 20-30 mg of potassium  Cranberries -  cup has 45 mg of potassium  Cranberry juice cocktail -  cup has 20 mg of potassium  Fats and oils - 1 Tbsp has less than 5 mg of potassium  Hummus - 1 Tbsp has 32 mg of potassium  Nectar (papaya, mango, or pear) -  cup has 35 mg of potassium  Rice (white or brown) -  cup has 50 mg of potassium  Spaghetti or macaroni (cooked) -  cup has 30 mg of potassium  Tortilla, flour or corn - 1 has 50 mg of potassium  Waffle - 1 four-inch waffle has 50 mg of potassium  Water chestnuts -  cup has 40 mg of potassium Summary  Potassium is a mineral found in many foods and drinks. It affects how the heart works, and helps keep fluids and minerals balanced in the body.  The amount of potassium you need each day depends on your age and any existing medical conditions you may have. Your health care provider or dietitian may recommend an amount of potassium that you should have each day. This information is not intended to replace advice given to you by your health care provider. Make sure you discuss any questions you have with your health care  provider. Document Revised: 10/29/2017 Document Reviewed: 02/10/2017 Elsevier Patient Education  2020 [employer]   

## 2020-05-08 NOTE — Progress Notes (Signed)
[place]  [phone] [phone]     ID: [name] DOB: May 30, 1929  MR#: [id number]  KDX#:[id number]  Patient Care Team: Jani Gravel, MD as PCP - General (Internal Medicine) Dr. Iantha Fallen, D.D.S., PA Scot Dock, NP OTHER MD:  CHIEF COMPLAINT: multiple myeloma  CURRENT TREATMENT: Bortezomib, Dexamethasone, denosumab Delton See   INTERVAL HISTORY: Moss returns today for follow up of his multiple myeloma.   He continues on bortezomib.  He tolerates this without any side effects and in particular he has had intermittent mild neuropathy in his toes, but this is not currently an issue for him.    He also receives dexamethasone weekly.     REVIEW OF SYSTEMS: Donyel tells me he is doing well.  He has no new issues.  He continues to have chronic pain in his bilateral rib cage.  He also experiences constipation that he takes miralax for.  Otherwise, he denies any new issues and a detailed ROS was non contributory.   HISTORY OF CURRENT ILLNESS: From the original intake note:  Mr. Joe [name] is an 86 y/o [place] man formerly followed by my partner Dr Julien Nordmann for M-GUS. We have an M-Spike of 0.87 documented 07/12/2012. [name] kappa/lambda light chains and total IgG were follower yearly until 01/2016, when they were 2.36 and 1517 respectively (unremarkable). He was lost to follow-up after that point. Yesterday [02/29/2020] the patient presented to Urgent Care with poorly controlled pain. This was felt to be mussculoskeletal and he was started on a medrol dose-pak and robaxin. However his pain worsened and he presented to the ED at [facility] where vitals and exam were unremarkable but labs showed a creatinine of 1.84 (baseline 1.0), calcium 12.7 with albumin 2.8 and total protein 10.4 (globulin fraction 7.8). CT of the abdomen obtained for evaluation of his abdominal and back pain showed no hydronephrosis but multiple lytic  lesons. We were consulted for further evaluation and treatment of likely multiple myeloma and he was admitted to the hospital.   The patient's subsequent history is as detailed below.   PAST MEDICAL HISTORY: Past Medical History:  Diagnosis Date  . Enlarged prostate   . Hypertension     PAST SURGICAL HISTORY: Past Surgical History:  Procedure Laterality Date  . EYE SURGERY    . PROSTATE SURGERY      FAMILY HISTORY Family History  Problem Relation Age of Onset  . Hypertension Mother     SOCIAL HISTORY:  Retired, used to work for the [employer] Lives with wife [place]. Has 4 children, 2 in [place], one in [place], one in [place]; 5 grandchildren and 5 great-grandchildren. Attands a [employer].    ADVANCED DIRECTIVES: at the initial visit the patient confirmed his wife is his HCPOA (despite her memory issues).   HEALTH MAINTENANCE: Social History   Tobacco Use  . Smoking status: Never Smoker  . Smokeless tobacco: Never Used  Substance Use Topics  . Alcohol use: Never  . Drug use: Never     Colonoscopy: 6-8 years ago     No Known Allergies  Current Outpatient Medications  Medication Sig Dispense Refill  . acetaminophen (TYLENOL) 500 MG tablet Take 1,000 mg by mouth every 6 (six) hours as needed for mild pain.    [name] acyclovir (ZOVIRAX) 400 MG tablet Take 1 tablet (400 mg total) by mouth 2 (two) times daily. 60 tablet 3  . amLODipine (NORVASC) 5 MG tablet Take 5 mg by mouth daily.    [name]  doxazosin (CARDURA) 4 MG tablet Take 4 mg by mouth at bedtime.    . famotidine (PEPCID) 20 MG tablet Take 1 tablet by mouth once daily 30 tablet 0  . finasteride (PROSCAR) 5 MG tablet Take 1 tablet (5 mg total) by mouth daily. 30 tablet 3  . indapamide (LOZOL) 2.5 MG tablet Take 2.5 mg by mouth daily.     . magnesium citrate SOLN Take 1/2 bottle if no bowel movement in 2 hours repeat (Patient not taking: Reported on 04/24/2020) 195 mL 1  . polyethylene glycol  powder (MIRALAX) 17 GM/SCOOP powder Take 255 g by mouth daily. 255 g 0  . pravastatin (PRAVACHOL) 40 MG tablet Take 40 mg by mouth daily.    . prochlorperazine (COMPAZINE) 10 MG tablet Take 1 tablet (10 mg total) by mouth every 6 (six) hours as needed (Nausea or vomiting). 30 tablet 1  . senna-docusate (SENOKOT-S) 8.6-50 MG tablet Take 2 tablets by mouth 2 (two) times daily. 120 tablet 0  . traMADol (ULTRAM) 50 MG tablet Take 1 tablet (50 mg total) by mouth every 8 (eight) hours as needed for severe pain. 60 tablet 0  . vitamin B-12 (CYANOCOBALAMIN) 1000 MCG tablet Take 1,000 mcg by mouth daily.     No current facility-administered medications for this visit.    OBJECTIVE: African-American man who appears well  There were no vitals filed for this visit.   There is no height or weight on file to calculate BMI.   Wt Readings from Last 3 Encounters:  04/24/20 197 lb 4 oz (89.5 kg)  04/17/20 197 lb 12 oz (89.7 kg)  04/10/20 196 lb 4.8 oz (89 kg)      ECOG FS:1 - Symptomatic but completely ambulatory GENERAL: Patient is a well appearing male in no acute distress HEENT:  Sclerae anicteric.  Oropharynx clear and moist. No ulcerations or evidence of oropharyngeal candidiasis. Neck is supple.  NODES:  No cervical, supraclavicular, or axillary lymphadenopathy palpated.  LUNGS:  Clear to auscultation bilaterally.  No wheezes or rhonchi. HEART:  Regular rate and rhythm. No murmur appreciated. ABDOMEN:  Soft, nontender.  Positive, normoactive bowel sounds. No organomegaly palpated. MSK:  No focal spinal tenderness to palpation. Full range of motion bilaterally in the upper extremities. EXTREMITIES:  No peripheral edema.   SKIN:  Clear with no obvious rashes or skin changes. No nail dyscrasia. NEURO:  Nonfocal. Well oriented.  Appropriate affect.      LAB RESULTS:  CMP     Component Value Date/Time   NA 140 04/24/2020 1352   NA 140 01/09/2016 1045   K 3.5 04/24/2020 1352   K 3.8  01/09/2016 1045   CL 103 04/24/2020 1352   CO2 30 04/24/2020 1352   CO2 27 01/09/2016 1045   GLUCOSE 107 (H) 04/24/2020 1352   GLUCOSE 89 01/09/2016 1045   BUN 13 04/24/2020 1352   BUN 14.7 01/09/2016 1045   CREATININE 0.86 04/24/2020 1352   CREATININE 1.0 01/09/2016 1045   CALCIUM 8.8 (L) 04/24/2020 1352   CALCIUM 9.2 01/09/2016 1045   PROT 6.5 04/24/2020 1352   PROT 7.3 01/09/2016 1045   ALBUMIN 3.2 (L) 04/24/2020 1352   ALBUMIN 3.7 01/09/2016 1045   AST 16 04/24/2020 1352   AST 18 01/09/2016 1045   ALT 11 04/24/2020 1352   ALT 15 01/09/2016 1045   ALKPHOS 146 (H) 04/24/2020 1352   ALKPHOS 60 01/09/2016 1045   BILITOT 0.5 04/24/2020 1352   BILITOT 0.51 01/09/2016 1045  GFRNONAA >60 04/24/2020 1352   GFRAA >60 04/24/2020 1352    Lab Results  Component Value Date   TOTALPROTELP 6.5 04/17/2020   ALBUMINELP 56.1 07/12/2012   A1GS 3.5 07/12/2012   A2GS 9.8 07/12/2012   BETS 5.1 07/12/2012   BETA2SER 3.6 07/12/2012   GAMS 21.9 (H) 07/12/2012   MSPIKE 0.87 07/12/2012   SPEI * 07/12/2012     Lab Results  Component Value Date   KPAFRELGTCHN 46.6 (H) 04/17/2020   LAMBDASER 6.6 04/17/2020   KAPLAMBRATIO 7.06 (H) 04/17/2020    Lab Results  Component Value Date   WBC 6.0 04/24/2020   NEUTROABS 3.9 04/24/2020   HGB 10.0 (L) 04/24/2020   HCT 31.5 (L) 04/24/2020   MCV 80.8 04/24/2020   PLT 163 04/24/2020      Chemistry      Component Value Date/Time   NA 140 04/24/2020 1352   NA 140 01/09/2016 1045   K 3.5 04/24/2020 1352   K 3.8 01/09/2016 1045   CL 103 04/24/2020 1352   CO2 30 04/24/2020 1352   CO2 27 01/09/2016 1045   BUN 13 04/24/2020 1352   BUN 14.7 01/09/2016 1045   CREATININE 0.86 04/24/2020 1352   CREATININE 1.0 01/09/2016 1045      Component Value Date/Time   CALCIUM 8.8 (L) 04/24/2020 1352   CALCIUM 9.2 01/09/2016 1045   ALKPHOS 146 (H) 04/24/2020 1352   ALKPHOS 60 01/09/2016 1045   AST 16 04/24/2020 1352   AST 18 01/09/2016 1045   ALT 11  04/24/2020 1352   ALT 15 01/09/2016 1045   BILITOT 0.5 04/24/2020 1352   BILITOT 0.51 01/09/2016 1045       No results found for: LABCA2  No components found for: XQJJHE174  No results for input(s): INR in the last 168 hours.  No results found for: LABCA2  No results found for: YCX448  No results found for: JEH631  No results found for: SHF026  No results found for: CA2729  No components found for: HGQUANT  No results found for: CEA1 / No results found for: CEA1   No results found for: AFPTUMOR  No results found for: CHROMOGRNA  No results found for: HGBA, HGBA2QUANT, HGBFQUANT, HGBSQUAN (Hemoglobinopathy evaluation)   Lab Results  Component Value Date   LDH 136 01/09/2016    Lab Results  Component Value Date   IRON 49 03/01/2020   TIBC 238 (L) 03/01/2020   IRONPCTSAT 21 03/01/2020   (Iron and TIBC)  Lab Results  Component Value Date   FERRITIN 375 (H) 03/01/2020    Urinalysis    Component Value Date/Time   COLORURINE AMBER (A) 03/01/2020 1052   APPEARANCEUR CLOUDY (A) 03/01/2020 1052   LABSPEC 1.021 03/01/2020 1052   PHURINE 5.0 03/01/2020 1052   GLUCOSEU NEGATIVE 03/01/2020 1052   HGBUR NEGATIVE 03/01/2020 1052   BILIRUBINUR NEGATIVE 03/01/2020 1052   KETONESUR NEGATIVE 03/01/2020 1052   PROTEINUR 100 (A) 03/01/2020 1052   UROBILINOGEN 0.2 02/29/2020 1537   NITRITE NEGATIVE 03/01/2020 [place] 03/01/2020 1052    STUDIES: No results found.   ELIGIBLE FOR AVAILABLE RESEARCH PROTOCOL:   ASSESSMENT: 86 y.o.  54 [place] man with a history of M-GUS, presenting 02/29/2020 with a high globulin fraction, worsening renal function, hypercalcemia, anemia and multiple lytic bone lesions.   (1) IgG Kappa Multiple Myeloma: Labs diagnostic on 03/01/2020 with Mspike of 3.8, Kappa free light chain 690.6, and ratio 117.05.  (a) CT A/P on 03/01/2020 shows "  innumerable" lytic lesions involving all visualized bones  (b) bone marrow  biopsy 03/11/2020 shows 20% plasmacytosis by CD138, 12% by manual differential; molecular studies not requested  (c) Bortezomib and Dexamethasone given weekly 3 weeks on and 1 week off beginning 03/11/2020    (2) lytic bone lesions/ hypercalcemia:  (a) Pamidronate administered on 03/01/2020-- resolved  (b) denosumab/Xgeva to start 04/17/2020-- repeat every 12 weeks   (a) take TUMS 3 tabs on day of Xgeva dose   PLAN: Mr. Wittler is having a good initial response to his myeloma treatment. His labs are stable and he is remaining active by doing yard work throughout the day.  He did note some mild intermittent peripheral neuropathy that has since resolved.  He knows to let us know if it worsens, or persists.    He will continue on Miralax for his constipation as this has been controlling it.  His potassium is mildly decreased today.  I gave him a handout on potassium rich foods in his AVS.  He is not on a diuretic.   Mr. Cudworth will continue to receive weekly injections, and will return in 4 weeks for f/u with Dr. Jana Hakim.  He knows to call for any questions or concerns that may arise between now and his next appointment with [place].    Total encounter time 20 minutes.Wilber Bihari, NP 05/08/20 7:57 AM Medical Oncology and Hematology [facility] [place], [place] [address] Tel. [phone]    Fax. [phone]   *Total Encounter Time as defined by the Centers for Medicare and Medicaid Services includes, in addition to the face-to-face time of a patient visit (documented in the note above) non-face-to-face time: obtaining and reviewing outside history, ordering and reviewing medications, tests or procedures, care coordination (communications with other health care professionals or caregivers) and documentation in the medical record.

## 2020-05-08 NOTE — Patient Instructions (Signed)
[facility] Discharge Instructions for Patients Receiving Chemotherapy  Today you received the following chemotherapy agents Velcade.  To help prevent nausea and vomiting after your treatment, we encourage you to take your nausea medication as directed.  If you develop nausea and vomiting that is not controlled by your nausea medication, call the clinic.   BELOW ARE SYMPTOMS THAT SHOULD BE REPORTED IMMEDIATELY:  *FEVER GREATER THAN 100.5 F  *CHILLS WITH OR WITHOUT FEVER  NAUSEA AND VOMITING THAT IS NOT CONTROLLED WITH YOUR NAUSEA MEDICATION  *UNUSUAL SHORTNESS OF BREATH  *UNUSUAL BRUISING OR BLEEDING  TENDERNESS IN MOUTH AND THROAT WITH OR WITHOUT PRESENCE OF ULCERS  *URINARY PROBLEMS  *BOWEL PROBLEMS  UNUSUAL RASH Items with * indicate a potential emergency and should be followed up as soon as possible.  Feel free to call the clinic should you have any questions or concerns. The clinic phone number is [phone].  Please show the CHEMO ALERT CARD at check-in to the Emergency Department and triage nurse.   

## 2020-05-09 ENCOUNTER — Telehealth: Payer: Self-pay | Admitting: Adult Health

## 2020-05-09 NOTE — Telephone Encounter (Signed)
No 6/9 los. No changes made to pt's schedule.

## 2020-05-15 ENCOUNTER — Inpatient Hospital Stay: Payer: Medicare Other

## 2020-05-15 ENCOUNTER — Inpatient Hospital Stay (HOSPITAL_BASED_OUTPATIENT_CLINIC_OR_DEPARTMENT_OTHER): Payer: Medicare Other | Admitting: Medical

## 2020-05-15 ENCOUNTER — Other Ambulatory Visit: Payer: Self-pay

## 2020-05-15 ENCOUNTER — Other Ambulatory Visit: Payer: Self-pay | Admitting: Medical

## 2020-05-15 VITALS — BP 139/74 | HR 93 | Temp 98.7°F | Resp 18

## 2020-05-15 DIAGNOSIS — C9000 Multiple myeloma not having achieved remission: Secondary | ICD-10-CM

## 2020-05-15 DIAGNOSIS — R21 Rash and other nonspecific skin eruption: Secondary | ICD-10-CM

## 2020-05-15 DIAGNOSIS — W57XXXS Bitten or stung by nonvenomous insect and other nonvenomous arthropods, sequela: Secondary | ICD-10-CM

## 2020-05-15 DIAGNOSIS — D649 Anemia, unspecified: Secondary | ICD-10-CM

## 2020-05-15 DIAGNOSIS — Z5112 Encounter for antineoplastic immunotherapy: Secondary | ICD-10-CM | POA: Diagnosis not present

## 2020-05-15 DIAGNOSIS — Z7189 Other specified counseling: Secondary | ICD-10-CM

## 2020-05-15 DIAGNOSIS — N179 Acute kidney failure, unspecified: Secondary | ICD-10-CM

## 2020-05-15 LAB — CMP (CANCER CENTER ONLY)
ALT: 9 U/L (ref 0–44)
AST: 14 U/L — ABNORMAL LOW (ref 15–41)
Albumin: 3.5 g/dL (ref 3.5–5.0)
Alkaline Phosphatase: 120 U/L (ref 38–126)
Anion gap: 8 (ref 5–15)
BUN: 13 mg/dL (ref 8–23)
CO2: 29 mmol/L (ref 22–32)
Calcium: 8.7 mg/dL — ABNORMAL LOW (ref 8.9–10.3)
Chloride: 102 mmol/L (ref 98–111)
Creatinine: 0.9 mg/dL (ref 0.61–1.24)
GFR, Est AFR Am: >60 mL/min (ref >60)
GFR, Estimated: >60 mL/min (ref >60)
Glucose, Bld: 112 mg/dL — ABNORMAL HIGH (ref 70–99)
Potassium: 3.2 mmol/L — ABNORMAL LOW (ref 3.5–5.1)
Sodium: 139 mmol/L (ref 135–145)
Total Bilirubin: 0.6 mg/dL (ref 0.3–1.2)
Total Protein: 6.9 g/dL (ref 6.5–8.1)

## 2020-05-15 LAB — CBC WITH DIFFERENTIAL (CANCER CENTER ONLY)
Abs Immature Granulocytes: 0.02 10*3/uL (ref 0.00–0.07)
Basophils Absolute: 0 10*3/uL (ref 0.0–0.1)
Basophils Relative: 0 %
Eosinophils Absolute: 0.1 10*3/uL (ref 0.0–0.5)
Eosinophils Relative: 2 %
HCT: 33.1 % — ABNORMAL LOW (ref 39.0–52.0)
Hemoglobin: 10.3 g/dL — ABNORMAL LOW (ref 13.0–17.0)
Immature Granulocytes: 0 %
Lymphocytes Relative: 20 %
Lymphs Abs: 1.3 10*3/uL (ref 0.7–4.0)
MCH: 25.9 pg — ABNORMAL LOW (ref 26.0–34.0)
MCHC: 31.1 g/dL (ref 30.0–36.0)
MCV: 83.4 fL (ref 80.0–100.0)
Monocytes Absolute: 0.8 10*3/uL (ref 0.1–1.0)
Monocytes Relative: 12 %
Neutro Abs: 4.2 10*3/uL (ref 1.7–7.7)
Neutrophils Relative %: 66 %
Platelet Count: 201 10*3/uL (ref 150–400)
RBC: 3.97 MIL/uL — ABNORMAL LOW (ref 4.22–5.81)
RDW: 14.6 % (ref 11.5–15.5)
WBC Count: 6.3 10*3/uL (ref 4.0–10.5)
nRBC: 0 % (ref 0.0–0.2)

## 2020-05-15 MED ORDER — PROCHLORPERAZINE MALEATE 10 MG PO TABS
ORAL_TABLET | ORAL | Status: AC
  Filled 2020-05-15: qty 1

## 2020-05-15 MED ORDER — PROCHLORPERAZINE MALEATE 10 MG PO TABS
10.0000 mg | ORAL_TABLET | Freq: Once | ORAL | Status: AC
  Administered 2020-05-15: 10 mg via ORAL

## 2020-05-15 MED ORDER — DEXAMETHASONE 4 MG PO TABS
40.0000 mg | ORAL_TABLET | Freq: Once | ORAL | Status: AC
  Administered 2020-05-15: 40 mg via ORAL

## 2020-05-15 MED ORDER — BORTEZOMIB CHEMO SQ INJECTION 3.5 MG (2.5MG/ML)
1.3000 mg/m2 | Freq: Once | INTRAMUSCULAR | Status: AC
  Administered 2020-05-15: 2.75 mg via SUBCUTANEOUS
  Filled 2020-05-15: qty 1.1

## 2020-05-15 MED ORDER — DEXAMETHASONE 4 MG PO TABS
ORAL_TABLET | ORAL | Status: AC
  Filled 2020-05-15: qty 10

## 2020-05-15 MED ORDER — DOXYCYCLINE HYCLATE 100 MG PO TABS: 100.0000 mg | ORAL_TABLET | Freq: Two times a day (BID) | ORAL | 0 refills | Status: AC

## 2020-05-15 NOTE — Patient Instructions (Signed)
[facility] Discharge Instructions for Patients Receiving Chemotherapy  Today you received the following chemotherapy agents Velcade.  To help prevent nausea and vomiting after your treatment, we encourage you to take your nausea medication as directed.  If you develop nausea and vomiting that is not controlled by your nausea medication, call the clinic.   BELOW ARE SYMPTOMS THAT SHOULD BE REPORTED IMMEDIATELY:  *FEVER GREATER THAN 100.5 F  *CHILLS WITH OR WITHOUT FEVER  NAUSEA AND VOMITING THAT IS NOT CONTROLLED WITH YOUR NAUSEA MEDICATION  *UNUSUAL SHORTNESS OF BREATH  *UNUSUAL BRUISING OR BLEEDING  TENDERNESS IN MOUTH AND THROAT WITH OR WITHOUT PRESENCE OF ULCERS  *URINARY PROBLEMS  *BOWEL PROBLEMS  UNUSUAL RASH Items with * indicate a potential emergency and should be followed up as soon as possible.  Feel free to call the clinic should you have any questions or concerns. The clinic phone number is [phone].  Please show the CHEMO ALERT CARD at check-in to the Emergency Department and triage nurse.   

## 2020-05-16 LAB — KAPPA/LAMBDA LIGHT CHAINS
Kappa free light chain: 39.6 mg/L — ABNORMAL HIGH (ref 3.3–19.4)
Kappa, lambda light chain ratio: 11.31 — ABNORMAL HIGH (ref 0.26–1.65)
Lambda free light chains: 3.5 mg/L — ABNORMAL LOW (ref 5.7–26.3)

## 2020-05-16 NOTE — Progress Notes (Signed)
The patient was seen in the infusion room today for a rash on his right proximal lateral lower extremity.  He reports that he had a tick bite 2 weeks ago.  Since that time and erythematous round lesion.  Lesion a target lesion but then stated that it was not a target lesion after he was shown a picture of that lesion.   He denies any fevers chills or sweats.  He was given a prescription for doxycycline 100 twice daily times.  Sandi Mealy, MHS, PA-C Physician Assistant

## 2020-05-18 LAB — MULTIPLE MYELOMA PANEL, SERUM
Albumin SerPl Elph-Mcnc: 3.4 g/dL (ref 2.9–4.4)
Albumin/Glob SerPl: 1.2 (ref 0.7–1.7)
Alpha 1: 0.3 g/dL (ref 0.0–0.4)
Alpha2 Glob SerPl Elph-Mcnc: 0.7 g/dL (ref 0.4–1.0)
B-Globulin SerPl Elph-Mcnc: 0.8 g/dL (ref 0.7–1.3)
Gamma Glob SerPl Elph-Mcnc: 1.2 g/dL (ref 0.4–1.8)
Globulin, Total: 3 g/dL (ref 2.2–3.9)
IgA: 37 mg/dL — ABNORMAL LOW (ref 61–437)
IgG (Immunoglobin G), Serum: 1261 mg/dL (ref 603–1613)
IgM (Immunoglobulin M), Srm: 27 mg/dL (ref 15–143)
M Protein SerPl Elph-Mcnc: 0.9 g/dL — ABNORMAL HIGH
Total Protein ELP: 6.4 g/dL (ref 6.0–8.5)

## 2020-05-22 ENCOUNTER — Other Ambulatory Visit: Payer: Self-pay

## 2020-05-22 ENCOUNTER — Inpatient Hospital Stay: Payer: Medicare Other

## 2020-05-22 VITALS — BP 147/81 | HR 74 | Temp 97.9°F | Resp 16 | Wt 196.5 lb

## 2020-05-22 DIAGNOSIS — C9000 Multiple myeloma not having achieved remission: Secondary | ICD-10-CM

## 2020-05-22 DIAGNOSIS — Z5112 Encounter for antineoplastic immunotherapy: Secondary | ICD-10-CM | POA: Diagnosis not present

## 2020-05-22 LAB — CMP (CANCER CENTER ONLY)
ALT: 14 U/L (ref 0–44)
AST: 15 U/L (ref 15–41)
Albumin: 3.6 g/dL (ref 3.5–5.0)
Alkaline Phosphatase: 115 U/L (ref 38–126)
Anion gap: 6 (ref 5–15)
BUN: 14 mg/dL (ref 8–23)
CO2: 29 mmol/L (ref 22–32)
Calcium: 9 mg/dL (ref 8.9–10.3)
Chloride: 103 mmol/L (ref 98–111)
Creatinine: 1.11 mg/dL (ref 0.61–1.24)
GFR, Est AFR Am: >60 mL/min (ref >60)
GFR, Estimated: 59 mL/min — ABNORMAL LOW (ref >60)
Glucose, Bld: 82 mg/dL (ref 70–99)
Potassium: 3.5 mmol/L (ref 3.5–5.1)
Sodium: 138 mmol/L (ref 135–145)
Total Bilirubin: 0.6 mg/dL (ref 0.3–1.2)
Total Protein: 6.8 g/dL (ref 6.5–8.1)

## 2020-05-22 LAB — CBC WITH DIFFERENTIAL (CANCER CENTER ONLY)
Abs Immature Granulocytes: 0.03 10*3/uL (ref 0.00–0.07)
Basophils Absolute: 0 10*3/uL (ref 0.0–0.1)
Basophils Relative: 0 %
Eosinophils Absolute: 0.1 10*3/uL (ref 0.0–0.5)
Eosinophils Relative: 2 %
HCT: 33.6 % — ABNORMAL LOW (ref 39.0–52.0)
Hemoglobin: 10.7 g/dL — ABNORMAL LOW (ref 13.0–17.0)
Immature Granulocytes: 1 %
Lymphocytes Relative: 19 %
Lymphs Abs: 1.3 10*3/uL (ref 0.7–4.0)
MCH: 26.2 pg (ref 26.0–34.0)
MCHC: 31.8 g/dL (ref 30.0–36.0)
MCV: 82.2 fL (ref 80.0–100.0)
Monocytes Absolute: 0.9 10*3/uL (ref 0.1–1.0)
Monocytes Relative: 14 %
Neutro Abs: 4.3 10*3/uL (ref 1.7–7.7)
Neutrophils Relative %: 64 %
Platelet Count: 190 10*3/uL (ref 150–400)
RBC: 4.09 MIL/uL — ABNORMAL LOW (ref 4.22–5.81)
RDW: 14.5 % (ref 11.5–15.5)
WBC Count: 6.6 10*3/uL (ref 4.0–10.5)
nRBC: 0 % (ref 0.0–0.2)

## 2020-05-22 MED ORDER — BORTEZOMIB CHEMO SQ INJECTION 3.5 MG (2.5MG/ML)
1.3000 mg/m2 | Freq: Once | INTRAMUSCULAR | Status: AC
  Administered 2020-05-22: 2.75 mg via SUBCUTANEOUS
  Filled 2020-05-22: qty 1.1

## 2020-05-22 MED ORDER — DEXAMETHASONE 4 MG PO TABS
ORAL_TABLET | ORAL | Status: AC
  Filled 2020-05-22: qty 10

## 2020-05-22 MED ORDER — DEXAMETHASONE 4 MG PO TABS
40.0000 mg | ORAL_TABLET | Freq: Once | ORAL | Status: AC
  Administered 2020-05-22: 40 mg via ORAL

## 2020-05-22 MED ORDER — PROCHLORPERAZINE MALEATE 10 MG PO TABS
10.0000 mg | ORAL_TABLET | Freq: Once | ORAL | Status: AC
  Administered 2020-05-22: 10 mg via ORAL

## 2020-05-22 MED ORDER — PROCHLORPERAZINE MALEATE 10 MG PO TABS
ORAL_TABLET | ORAL | Status: AC
  Filled 2020-05-22: qty 1

## 2020-05-22 NOTE — Patient Instructions (Signed)
[facility] Discharge Instructions for Patients Receiving Chemotherapy  Today you received the following chemotherapy agents Bortezomib (VELCADE).  To help prevent nausea and vomiting after your treatment, we encourage you to take your nausea medication as prescribed.   If you develop nausea and vomiting that is not controlled by your nausea medication, call the clinic.   BELOW ARE SYMPTOMS THAT SHOULD BE REPORTED IMMEDIATELY:  *FEVER GREATER THAN 100.5 F  *CHILLS WITH OR WITHOUT FEVER  NAUSEA AND VOMITING THAT IS NOT CONTROLLED WITH YOUR NAUSEA MEDICATION  *UNUSUAL SHORTNESS OF BREATH  *UNUSUAL BRUISING OR BLEEDING  TENDERNESS IN MOUTH AND THROAT WITH OR WITHOUT PRESENCE OF ULCERS  *URINARY PROBLEMS  *BOWEL PROBLEMS  UNUSUAL RASH Items with * indicate a potential emergency and should be followed up as soon as possible.  Feel free to call the clinic should you have any questions or concerns. The clinic phone number is [phone].  Please show the CHEMO ALERT CARD at check-in to the Emergency Department and triage nurse.   

## 2020-05-30 ENCOUNTER — Other Ambulatory Visit: Payer: Self-pay | Admitting: Oncology

## 2020-06-05 ENCOUNTER — Other Ambulatory Visit: Payer: Self-pay

## 2020-06-05 ENCOUNTER — Inpatient Hospital Stay: Payer: Medicare Other | Attending: Adult Health

## 2020-06-05 ENCOUNTER — Inpatient Hospital Stay (HOSPITAL_BASED_OUTPATIENT_CLINIC_OR_DEPARTMENT_OTHER): Payer: Medicare Other | Admitting: Oncology

## 2020-06-05 ENCOUNTER — Inpatient Hospital Stay: Payer: Medicare Other

## 2020-06-05 VITALS — BP 146/61 | HR 82 | Temp 99.2°F | Resp 18 | Ht 71.0 in | Wt 201.1 lb

## 2020-06-05 DIAGNOSIS — C9000 Multiple myeloma not having achieved remission: Secondary | ICD-10-CM | POA: Insufficient documentation

## 2020-06-05 DIAGNOSIS — D649 Anemia, unspecified: Secondary | ICD-10-CM | POA: Insufficient documentation

## 2020-06-05 DIAGNOSIS — Z5112 Encounter for antineoplastic immunotherapy: Secondary | ICD-10-CM | POA: Insufficient documentation

## 2020-06-05 LAB — CMP (CANCER CENTER ONLY)
ALT: 13 U/L (ref 0–44)
AST: 16 U/L (ref 15–41)
Albumin: 3.4 g/dL — ABNORMAL LOW (ref 3.5–5.0)
Alkaline Phosphatase: 97 U/L (ref 38–126)
Anion gap: 9 (ref 5–15)
BUN: 13 mg/dL (ref 8–23)
CO2: 26 mmol/L (ref 22–32)
Calcium: 8.7 mg/dL — ABNORMAL LOW (ref 8.9–10.3)
Chloride: 106 mmol/L (ref 98–111)
Creatinine: 0.88 mg/dL (ref 0.61–1.24)
GFR, Est AFR Am: >60 mL/min (ref >60)
GFR, Estimated: >60 mL/min (ref >60)
Glucose, Bld: 107 mg/dL — ABNORMAL HIGH (ref 70–99)
Potassium: 3.3 mmol/L — ABNORMAL LOW (ref 3.5–5.1)
Sodium: 141 mmol/L (ref 135–145)
Total Bilirubin: 0.6 mg/dL (ref 0.3–1.2)
Total Protein: 6.6 g/dL (ref 6.5–8.1)

## 2020-06-05 LAB — CBC WITH DIFFERENTIAL (CANCER CENTER ONLY)
Abs Immature Granulocytes: 0.01 10*3/uL (ref 0.00–0.07)
Basophils Absolute: 0 10*3/uL (ref 0.0–0.1)
Basophils Relative: 0 %
Eosinophils Absolute: 0.1 10*3/uL (ref 0.0–0.5)
Eosinophils Relative: 2 %
HCT: 31.7 % — ABNORMAL LOW (ref 39.0–52.0)
Hemoglobin: 9.9 g/dL — ABNORMAL LOW (ref 13.0–17.0)
Immature Granulocytes: 0 %
Lymphocytes Relative: 19 %
Lymphs Abs: 0.9 10*3/uL (ref 0.7–4.0)
MCH: 25.5 pg — ABNORMAL LOW (ref 26.0–34.0)
MCHC: 31.2 g/dL (ref 30.0–36.0)
MCV: 81.7 fL (ref 80.0–100.0)
Monocytes Absolute: 0.7 10*3/uL (ref 0.1–1.0)
Monocytes Relative: 15 %
Neutro Abs: 2.9 10*3/uL (ref 1.7–7.7)
Neutrophils Relative %: 64 %
Platelet Count: 215 10*3/uL (ref 150–400)
RBC: 3.88 MIL/uL — ABNORMAL LOW (ref 4.22–5.81)
RDW: 14.4 % (ref 11.5–15.5)
WBC Count: 4.6 10*3/uL (ref 4.0–10.5)
nRBC: 0 % (ref 0.0–0.2)

## 2020-06-05 MED ORDER — PROCHLORPERAZINE MALEATE 10 MG PO TABS
ORAL_TABLET | ORAL | Status: AC
  Filled 2020-06-05: qty 1

## 2020-06-05 MED ORDER — TAMSULOSIN HCL 0.4 MG PO CAPS: 0.4000 mg | ORAL_CAPSULE | Freq: Every day | ORAL | 6 refills | Status: DC

## 2020-06-05 MED ORDER — BORTEZOMIB CHEMO SQ INJECTION 3.5 MG (2.5MG/ML)
1.3000 mg/m2 | Freq: Once | INTRAMUSCULAR | Status: AC
  Administered 2020-06-05: 2.75 mg via SUBCUTANEOUS
  Filled 2020-06-05: qty 1.1

## 2020-06-05 MED ORDER — DEXAMETHASONE 4 MG PO TABS
40.0000 mg | ORAL_TABLET | Freq: Once | ORAL | Status: AC
  Administered 2020-06-05: 40 mg via ORAL

## 2020-06-05 MED ORDER — DEXAMETHASONE 4 MG PO TABS
ORAL_TABLET | ORAL | Status: AC
  Filled 2020-06-05: qty 10

## 2020-06-05 MED ORDER — PROCHLORPERAZINE MALEATE 10 MG PO TABS
10.0000 mg | ORAL_TABLET | Freq: Once | ORAL | Status: AC
  Administered 2020-06-05: 10 mg via ORAL

## 2020-06-05 NOTE — Patient Instructions (Signed)
[facility] Discharge Instructions for Patients Receiving Chemotherapy  Today you received the following chemotherapy agents Bortezomib (VELCADE).  To help prevent nausea and vomiting after your treatment, we encourage you to take your nausea medication as prescribed.   If you develop nausea and vomiting that is not controlled by your nausea medication, call the clinic.   BELOW ARE SYMPTOMS THAT SHOULD BE REPORTED IMMEDIATELY:  *FEVER GREATER THAN 100.5 F  *CHILLS WITH OR WITHOUT FEVER  NAUSEA AND VOMITING THAT IS NOT CONTROLLED WITH YOUR NAUSEA MEDICATION  *UNUSUAL SHORTNESS OF BREATH  *UNUSUAL BRUISING OR BLEEDING  TENDERNESS IN MOUTH AND THROAT WITH OR WITHOUT PRESENCE OF ULCERS  *URINARY PROBLEMS  *BOWEL PROBLEMS  UNUSUAL RASH Items with * indicate a potential emergency and should be followed up as soon as possible.  Feel free to call the clinic should you have any questions or concerns. The clinic phone number is [phone].  Please show the CHEMO ALERT CARD at check-in to the Emergency Department and triage nurse.   

## 2020-06-05 NOTE — Progress Notes (Signed)
[place]  [phone] [phone]     ID: Joe [name] DOB: 1929-06-19  MR#: [id number]  WGY#:[id number]  Patient Care Team: Jani Gravel, MD as PCP - General (Internal Medicine) Skylar Priest, Virgie Dad, MD as Consulting Physician (Oncology) Dr. Iantha Fallen, D.D.S., PA Chauncey Cruel, MD OTHER MD:  CHIEF COMPLAINT: multiple myeloma  CURRENT TREATMENT: Bortezomib, Dexamethasone, denosumab Delton See   INTERVAL HISTORY: Joe [name] returns today for follow up and treatment of his multiple myeloma.   He continues on bortezomib.  He tolerates this without any side effects and in particular he has had intermittent mild neuropathy in his toes, but this is not currently an issue for him.    He also receives dexamethasone weekly.   We are following chiefly his M spike Results for Joe [name], Joe [name] (MRN [id number]) as of 06/05/2020 14:27  Ref. Range 01/11/2015 09:54 01/09/2016 10:46 03/01/2020 18:45 04/17/2020 13:49 05/15/2020 13:29  M Protein SerPl Elph-Mcnc Latest Ref Range: Not Observed g/dL   3.8 (H) 1.1 (H) 0.9 (H)  Results for Joe [name], Joe [name] (MRN [id number]) as of 06/05/2020 14:27  Ref. Range 01/11/2015 09:54 01/09/2016 10:46 03/01/2020 18:45 04/17/2020 13:49 05/15/2020 13:29  IgG (Immunoglobin G), Serum Latest Ref Range: 603 - 1,613 mg/dL 1,760 (H) 1,517 5,947 (H) 1,614 (H) 1,261    REVIEW OF SYSTEMS: Joe [name] continues to tolerate treatment remarkably well.  He has no peripheral edema symptoms.  He occasionally gets cramps in the leg.  He had a great time on July 4 and ate too much he says.  He does have nocturia x3 and that is a concern to him as well as a bother.  Overall he says he feels better than he did before starting treatment.  Detailed review of systems today was otherwise stable   HISTORY OF CURRENT ILLNESS: From the original intake note:  Joe [name] is an 86 y/o [place] man formerly followed by my partner Dr Julien Nordmann for M-GUS. We have an M-Spike of 0.87  documented 07/12/2012. [name] kappa/lambda light chains and total IgG were follower yearly until 01/2016, when they were 2.36 and 1517 respectively (unremarkable). He was lost to follow-up after that point. Yesterday [02/29/2020] the patient presented to Urgent Care with poorly controlled pain. This was felt to be mussculoskeletal and he was started on a medrol dose-pak and robaxin. However his pain worsened and he presented to the ED at [facility] where vitals and exam were unremarkable but labs showed a creatinine of 1.84 (baseline 1.0), calcium 12.7 with albumin 2.8 and total protein 10.4 (globulin fraction 7.8). CT of the abdomen obtained for evaluation of his abdominal and back pain showed no hydronephrosis but multiple lytic lesons. We were consulted for further evaluation and treatment of likely multiple myeloma and he was admitted to the hospital.   The patient's subsequent history is as detailed below.   PAST MEDICAL HISTORY: Past Medical History:  Diagnosis Date  . Enlarged prostate   . Hypertension     PAST SURGICAL HISTORY: Past Surgical History:  Procedure Laterality Date  . EYE SURGERY    . PROSTATE SURGERY      FAMILY HISTORY Family History  Problem Relation Age of Onset  . Hypertension Mother     SOCIAL HISTORY:  Retired, used to work for the [employer] Lives with wife [place]. Has 4 children, 2 in [place], one in [place], one in [place]; 5 grandchildren and 5 great-grandchildren. Attands a [employer].    ADVANCED DIRECTIVES: at the  initial visit the patient confirmed his wife is his HCPOA (despite her memory issues).   HEALTH MAINTENANCE: Social History   Tobacco Use  . Smoking status: Never Smoker  . Smokeless tobacco: Never Used  Vaping Use  . Vaping Use: Never used  Substance Use Topics  . Alcohol use: Never  . Drug use: Never     Colonoscopy: 6-8 years ago     No Known Allergies  Current Outpatient  Medications  Medication Sig Dispense Refill  . acetaminophen (TYLENOL) 500 MG tablet Take 1,000 mg by mouth every 6 (six) hours as needed for mild pain.    [name] acyclovir (ZOVIRAX) 400 MG tablet Take 1 tablet (400 mg total) by mouth 2 (two) times daily. 60 tablet 3  . amLODipine (NORVASC) 5 MG tablet Take 5 mg by mouth daily.    . calcium carbonate (TUMS - DOSED IN MG ELEMENTAL CALCIUM) 500 MG chewable tablet Chew 3 tablets by mouth daily.    [name] doxazosin (CARDURA) 4 MG tablet Take 4 mg by mouth at bedtime.    . famotidine (PEPCID) 20 MG tablet Take 1 tablet by mouth once daily 30 tablet 0  . finasteride (PROSCAR) 5 MG tablet Take 1 tablet (5 mg total) by mouth daily. 30 tablet 3  . indapamide (LOZOL) 2.5 MG tablet Take 2.5 mg by mouth daily.     . magnesium citrate SOLN Take 1/2 bottle if no bowel movement in 2 hours repeat (Patient not taking: Reported on 04/24/2020) 195 mL 1  . polyethylene glycol powder (MIRALAX) 17 GM/SCOOP powder Take 255 g by mouth daily. 255 g 0  . pravastatin (PRAVACHOL) 40 MG tablet Take 40 mg by mouth daily.    . prochlorperazine (COMPAZINE) 10 MG tablet Take 1 tablet (10 mg total) by mouth every 6 (six) hours as needed (Nausea or vomiting). 30 tablet 1  . senna-docusate (SENOKOT-S) 8.6-50 MG tablet Take 2 tablets by mouth 2 (two) times daily. (Patient taking differently: Take 2 tablets by mouth 2 (two) times daily as needed. ) 120 tablet 0  . tamsulosin (FLOMAX) 0.4 MG CAPS capsule Take 1 capsule (0.4 mg total) by mouth daily after supper. 30 capsule 6  . traMADol (ULTRAM) 50 MG tablet Take 1 tablet (50 mg total) by mouth every 8 (eight) hours as needed for severe pain. 60 tablet 0  . vitamin B-12 (CYANOCOBALAMIN) 1000 MCG tablet Take 1,000 mcg by mouth daily.     No current facility-administered medications for this visit.    OBJECTIVE: African-American man in no acute distress  Vitals:   06/05/20 1148  BP: (!) 146/61  Pulse: 82  Resp: 18  Temp: 99.2 F (37.3 C)   SpO2: 99%     Body mass index is 28.05 kg/m.   Wt Readings from Last 3 Encounters:  06/05/20 201 lb 1.6 oz (91.2 kg)  05/22/20 196 lb 8 oz (89.1 kg)  05/08/20 196 lb 9.6 oz (89.2 kg)      ECOG FS:1 - Symptomatic but completely ambulatory  Sclerae unicteric, EOMs intact Wearing a mask No cervical or supraclavicular adenopathy Lungs no rales or rhonchi Heart regular rate and rhythm Abd soft, nontender, positive bowel sounds MSK no focal spinal tenderness, no upper extremity lymphedema Neuro: nonfocal, well oriented, appropriate affect Breasts: Deferred   LAB RESULTS:  CMP     Component Value Date/Time   NA 141 06/05/2020 1137   NA 140 01/09/2016 1045   K 3.3 (L) 06/05/2020 1137   K 3.8  01/09/2016 1045   CL 106 06/05/2020 1137   CO2 26 06/05/2020 1137   CO2 27 01/09/2016 1045   GLUCOSE 107 (H) 06/05/2020 1137   GLUCOSE 89 01/09/2016 1045   BUN 13 06/05/2020 1137   BUN 14.7 01/09/2016 1045   CREATININE 0.88 06/05/2020 1137   CREATININE 1.0 01/09/2016 1045   CALCIUM 8.7 (L) 06/05/2020 1137   CALCIUM 9.2 01/09/2016 1045   PROT 6.6 06/05/2020 1137   PROT 7.3 01/09/2016 1045   ALBUMIN 3.4 (L) 06/05/2020 1137   ALBUMIN 3.7 01/09/2016 1045   AST 16 06/05/2020 1137   AST 18 01/09/2016 1045   ALT 13 06/05/2020 1137   ALT 15 01/09/2016 1045   ALKPHOS 97 06/05/2020 1137   ALKPHOS 60 01/09/2016 1045   BILITOT 0.6 06/05/2020 1137   BILITOT 0.51 01/09/2016 1045   GFRNONAA >60 06/05/2020 1137   GFRAA >60 06/05/2020 1137    Lab Results  Component Value Date   TOTALPROTELP 6.4 05/15/2020   ALBUMINELP 56.1 07/12/2012   A1GS 3.5 07/12/2012   A2GS 9.8 07/12/2012   BETS 5.1 07/12/2012   BETA2SER 3.6 07/12/2012   GAMS 21.9 (H) 07/12/2012   MSPIKE 0.87 07/12/2012   SPEI * 07/12/2012     Lab Results  Component Value Date   KPAFRELGTCHN 39.6 (H) 05/15/2020   LAMBDASER 3.5 (L) 05/15/2020   KAPLAMBRATIO 11.31 (H) 05/15/2020    Lab Results  Component Value Date    WBC 4.6 06/05/2020   NEUTROABS 2.9 06/05/2020   HGB 9.9 (L) 06/05/2020   HCT 31.7 (L) 06/05/2020   MCV 81.7 06/05/2020   PLT 215 06/05/2020      Chemistry      Component Value Date/Time   NA 141 06/05/2020 1137   NA 140 01/09/2016 1045   K 3.3 (L) 06/05/2020 1137   K 3.8 01/09/2016 1045   CL 106 06/05/2020 1137   CO2 26 06/05/2020 1137   CO2 27 01/09/2016 1045   BUN 13 06/05/2020 1137   BUN 14.7 01/09/2016 1045   CREATININE 0.88 06/05/2020 1137   CREATININE 1.0 01/09/2016 1045      Component Value Date/Time   CALCIUM 8.7 (L) 06/05/2020 1137   CALCIUM 9.2 01/09/2016 1045   ALKPHOS 97 06/05/2020 1137   ALKPHOS 60 01/09/2016 1045   AST 16 06/05/2020 1137   AST 18 01/09/2016 1045   ALT 13 06/05/2020 1137   ALT 15 01/09/2016 1045   BILITOT 0.6 06/05/2020 1137   BILITOT 0.51 01/09/2016 1045       No results found for: LABCA2  No components found for: ACZYSA630  No results for input(s): INR in the last 168 hours.  No results found for: LABCA2  No results found for: ZSW109  No results found for: NAT557  No results found for: DUK025  No results found for: CA2729  No components found for: HGQUANT  No results found for: CEA1 / No results found for: CEA1   No results found for: AFPTUMOR  No results found for: CHROMOGRNA  No results found for: HGBA, HGBA2QUANT, HGBFQUANT, HGBSQUAN (Hemoglobinopathy evaluation)   Lab Results  Component Value Date   LDH 136 01/09/2016    Lab Results  Component Value Date   IRON 49 03/01/2020   TIBC 238 (L) 03/01/2020   IRONPCTSAT 21 03/01/2020   (Iron and TIBC)  Lab Results  Component Value Date   FERRITIN 375 (H) 03/01/2020    Urinalysis    Component Value Date/Time   COLORURINE AMBER (  A) 03/01/2020 1052   APPEARANCEUR CLOUDY (A) 03/01/2020 1052   LABSPEC 1.021 03/01/2020 1052   PHURINE 5.0 03/01/2020 1052   GLUCOSEU NEGATIVE 03/01/2020 1052   HGBUR NEGATIVE 03/01/2020 1052   BILIRUBINUR NEGATIVE  03/01/2020 1052   KETONESUR NEGATIVE 03/01/2020 1052   PROTEINUR 100 (A) 03/01/2020 1052   UROBILINOGEN 0.2 02/29/2020 1537   NITRITE NEGATIVE 03/01/2020 1052   LEUKOCYTESUR NEGATIVE 03/01/2020 1052    STUDIES: No results found.   ELIGIBLE FOR AVAILABLE RESEARCH PROTOCOL:   ASSESSMENT: 86 y.o.  2 [place] man with a history of M-GUS, presenting 02/29/2020 with a high globulin fraction, worsening renal function, hypercalcemia, anemia and multiple lytic bone lesions.   (1) IgG Kappa Multiple Myeloma: Labs diagnostic on 03/01/2020 with Mspike of 3.8, Kappa free light chain 690.6, and ratio 117.05.  (a) CT A/P on 03/01/2020 shows "innumerable" lytic lesions involving all visualized bones  (b) bone marrow biopsy 03/11/2020 shows 20% plasmacytosis by CD138, 12% by manual differential; molecular studies not requested  (c) Bortezomib and Dexamethasone given weekly 3 weeks on and 1 week off beginning 03/11/2020    (2) lytic bone lesions/ hypercalcemia:  (a) Pamidronate administered on 03/01/2020-- resolved  (b) denosumab/Xgeva to start 04/17/2020-- repeat every 12 weeks   (a) take TUMS 3 tabs on day of Xgeva dose   PLAN: Joe [name] has had a very good response to Velcade and dexamethasone.  His M spike appears to be stabilizing.  If it is stable in this coming determination I will plan to switch him over to Revlimid daily to make his life a little simpler.  He is very much in agreement with this plan.  I am writing for Flomax for him.  He understands that this may cause his blood pressure to drop a little bit at night and he will need to be extra careful when he gets up to urinate at night  Otherwise he will see me again in a month.  He knows to call for any other issue that may develop before then  Total encounter time 25 minutes.Sarajane Jews C. Maeryn Mcgath, MD 06/05/20 2:27 PM Medical Oncology and Hematology [facility] [place], [place] [address] Tel. [phone]     Fax. [phone]   I, Wilburn Mylar, am acting as scribe for Dr. Virgie Dad. Almarie Kurdziel.  I, Lurline Del MD, have reviewed the above documentation for accuracy and completeness, and I agree with the above.    *Total Encounter Time as defined by the Centers for Medicare and Medicaid Services includes, in addition to the face-to-face time of a patient visit (documented in the note above) non-face-to-face time: obtaining and reviewing outside history, ordering and reviewing medications, tests or procedures, care coordination (communications with other health care professionals or caregivers) and documentation in the medical record.

## 2020-06-07 ENCOUNTER — Telehealth: Payer: Self-pay | Admitting: Oncology

## 2020-06-07 NOTE — Telephone Encounter (Signed)
Scheduled appts per 7/7 los. Unable to leave voicemail. Mailed appt reminder and calendar.

## 2020-06-12 ENCOUNTER — Inpatient Hospital Stay: Payer: Medicare Other

## 2020-06-12 ENCOUNTER — Other Ambulatory Visit: Payer: Self-pay

## 2020-06-12 VITALS — BP 122/57 | HR 93 | Temp 98.1°F | Resp 18 | Ht 71.0 in | Wt 199.8 lb

## 2020-06-12 DIAGNOSIS — C9000 Multiple myeloma not having achieved remission: Secondary | ICD-10-CM

## 2020-06-12 DIAGNOSIS — D649 Anemia, unspecified: Secondary | ICD-10-CM

## 2020-06-12 DIAGNOSIS — Z7189 Other specified counseling: Secondary | ICD-10-CM

## 2020-06-12 DIAGNOSIS — Z5112 Encounter for antineoplastic immunotherapy: Secondary | ICD-10-CM | POA: Diagnosis not present

## 2020-06-12 DIAGNOSIS — N179 Acute kidney failure, unspecified: Secondary | ICD-10-CM

## 2020-06-12 LAB — CBC WITH DIFFERENTIAL (CANCER CENTER ONLY)
Abs Immature Granulocytes: 0.02 10*3/uL (ref 0.00–0.07)
Basophils Absolute: 0 10*3/uL (ref 0.0–0.1)
Basophils Relative: 0 %
Eosinophils Absolute: 0.1 10*3/uL (ref 0.0–0.5)
Eosinophils Relative: 2 %
HCT: 33.5 % — ABNORMAL LOW (ref 39.0–52.0)
Hemoglobin: 10.5 g/dL — ABNORMAL LOW (ref 13.0–17.0)
Immature Granulocytes: 0 %
Lymphocytes Relative: 22 %
Lymphs Abs: 1 10*3/uL (ref 0.7–4.0)
MCH: 25.7 pg — ABNORMAL LOW (ref 26.0–34.0)
MCHC: 31.3 g/dL (ref 30.0–36.0)
MCV: 82.1 fL (ref 80.0–100.0)
Monocytes Absolute: 0.7 10*3/uL (ref 0.1–1.0)
Monocytes Relative: 14 %
Neutro Abs: 3 10*3/uL (ref 1.7–7.7)
Neutrophils Relative %: 62 %
Platelet Count: 197 10*3/uL (ref 150–400)
RBC: 4.08 MIL/uL — ABNORMAL LOW (ref 4.22–5.81)
RDW: 14.4 % (ref 11.5–15.5)
WBC Count: 4.8 10*3/uL (ref 4.0–10.5)
nRBC: 0 % (ref 0.0–0.2)

## 2020-06-12 LAB — CMP (CANCER CENTER ONLY)
ALT: 11 U/L (ref 0–44)
AST: 16 U/L (ref 15–41)
Albumin: 3.5 g/dL (ref 3.5–5.0)
Alkaline Phosphatase: 99 U/L (ref 38–126)
Anion gap: 9 (ref 5–15)
BUN: 14 mg/dL (ref 8–23)
CO2: 28 mmol/L (ref 22–32)
Calcium: 9.1 mg/dL (ref 8.9–10.3)
Chloride: 103 mmol/L (ref 98–111)
Creatinine: 0.99 mg/dL (ref 0.61–1.24)
GFR, Est AFR Am: >60 mL/min (ref >60)
GFR, Estimated: >60 mL/min (ref >60)
Glucose, Bld: 114 mg/dL — ABNORMAL HIGH (ref 70–99)
Potassium: 3.5 mmol/L (ref 3.5–5.1)
Sodium: 140 mmol/L (ref 135–145)
Total Bilirubin: 0.7 mg/dL (ref 0.3–1.2)
Total Protein: 6.9 g/dL (ref 6.5–8.1)

## 2020-06-12 MED ORDER — BORTEZOMIB CHEMO SQ INJECTION 3.5 MG (2.5MG/ML)
1.3000 mg/m2 | Freq: Once | INTRAMUSCULAR | Status: AC
  Administered 2020-06-12: 2.75 mg via SUBCUTANEOUS
  Filled 2020-06-12: qty 1.1

## 2020-06-12 MED ORDER — PROCHLORPERAZINE MALEATE 10 MG PO TABS
10.0000 mg | ORAL_TABLET | Freq: Once | ORAL | Status: AC
  Administered 2020-06-12: 10 mg via ORAL

## 2020-06-12 MED ORDER — PROCHLORPERAZINE MALEATE 10 MG PO TABS
ORAL_TABLET | ORAL | Status: AC
  Filled 2020-06-12: qty 1

## 2020-06-12 MED ORDER — DEXAMETHASONE 4 MG PO TABS
40.0000 mg | ORAL_TABLET | Freq: Once | ORAL | Status: AC
  Administered 2020-06-12: 40 mg via ORAL

## 2020-06-12 MED ORDER — DEXAMETHASONE 4 MG PO TABS
ORAL_TABLET | ORAL | Status: AC
  Filled 2020-06-12: qty 10

## 2020-06-12 NOTE — Patient Instructions (Signed)
[facility] Discharge Instructions for Patients Receiving Chemotherapy  Today you received the following chemotherapy agents Bortezomib (VELCADE).  To help prevent nausea and vomiting after your treatment, we encourage you to take your nausea medication as prescribed.   If you develop nausea and vomiting that is not controlled by your nausea medication, call the clinic.   BELOW ARE SYMPTOMS THAT SHOULD BE REPORTED IMMEDIATELY:  *FEVER GREATER THAN 100.5 F  *CHILLS WITH OR WITHOUT FEVER  NAUSEA AND VOMITING THAT IS NOT CONTROLLED WITH YOUR NAUSEA MEDICATION  *UNUSUAL SHORTNESS OF BREATH  *UNUSUAL BRUISING OR BLEEDING  TENDERNESS IN MOUTH AND THROAT WITH OR WITHOUT PRESENCE OF ULCERS  *URINARY PROBLEMS  *BOWEL PROBLEMS  UNUSUAL RASH Items with * indicate a potential emergency and should be followed up as soon as possible.  Feel free to call the clinic should you have any questions or concerns. The clinic phone number is [phone].  Please show the CHEMO ALERT CARD at check-in to the Emergency Department and triage nurse.   

## 2020-06-13 LAB — KAPPA/LAMBDA LIGHT CHAINS
Kappa free light chain: 36.7 mg/L — ABNORMAL HIGH (ref 3.3–19.4)
Kappa, lambda light chain ratio: 6.22 — ABNORMAL HIGH (ref 0.26–1.65)
Lambda free light chains: 5.9 mg/L (ref 5.7–26.3)

## 2020-06-17 LAB — MULTIPLE MYELOMA PANEL, SERUM
Albumin SerPl Elph-Mcnc: 3.6 g/dL (ref 2.9–4.4)
Albumin/Glob SerPl: 1.4 (ref 0.7–1.7)
Alpha 1: 0.2 g/dL (ref 0.0–0.4)
Alpha2 Glob SerPl Elph-Mcnc: 0.6 g/dL (ref 0.4–1.0)
B-Globulin SerPl Elph-Mcnc: 0.8 g/dL (ref 0.7–1.3)
Gamma Glob SerPl Elph-Mcnc: 1.1 g/dL (ref 0.4–1.8)
Globulin, Total: 2.7 g/dL (ref 2.2–3.9)
IgA: 37 mg/dL — ABNORMAL LOW (ref 61–437)
IgG (Immunoglobin G), Serum: 1176 mg/dL (ref 603–1613)
IgM (Immunoglobulin M), Srm: 29 mg/dL (ref 15–143)
M Protein SerPl Elph-Mcnc: 0.7 g/dL — ABNORMAL HIGH
Total Protein ELP: 6.3 g/dL (ref 6.0–8.5)

## 2020-06-19 ENCOUNTER — Inpatient Hospital Stay: Payer: Medicare Other

## 2020-06-19 ENCOUNTER — Other Ambulatory Visit: Payer: Self-pay

## 2020-06-19 VITALS — BP 138/80 | HR 70 | Temp 97.9°F | Resp 18

## 2020-06-19 DIAGNOSIS — C9000 Multiple myeloma not having achieved remission: Secondary | ICD-10-CM

## 2020-06-19 DIAGNOSIS — D649 Anemia, unspecified: Secondary | ICD-10-CM | POA: Diagnosis not present

## 2020-06-19 DIAGNOSIS — Z5112 Encounter for antineoplastic immunotherapy: Secondary | ICD-10-CM | POA: Diagnosis not present

## 2020-06-19 LAB — CMP (CANCER CENTER ONLY)
ALT: 12 U/L (ref 0–44)
AST: 16 U/L (ref 15–41)
Albumin: 3.5 g/dL (ref 3.5–5.0)
Alkaline Phosphatase: 91 U/L (ref 38–126)
Anion gap: 8 (ref 5–15)
BUN: 14 mg/dL (ref 8–23)
CO2: 28 mmol/L (ref 22–32)
Calcium: 9.3 mg/dL (ref 8.9–10.3)
Chloride: 103 mmol/L (ref 98–111)
Creatinine: 1.01 mg/dL (ref 0.61–1.24)
GFR, Est AFR Am: >60 mL/min (ref >60)
GFR, Estimated: >60 mL/min (ref >60)
Glucose, Bld: 94 mg/dL (ref 70–99)
Potassium: 3.5 mmol/L (ref 3.5–5.1)
Sodium: 139 mmol/L (ref 135–145)
Total Bilirubin: 0.6 mg/dL (ref 0.3–1.2)
Total Protein: 6.6 g/dL (ref 6.5–8.1)

## 2020-06-19 LAB — CBC WITH DIFFERENTIAL (CANCER CENTER ONLY)
Abs Immature Granulocytes: 0.03 10*3/uL (ref 0.00–0.07)
Basophils Absolute: 0 10*3/uL (ref 0.0–0.1)
Basophils Relative: 0 %
Eosinophils Absolute: 0.1 10*3/uL (ref 0.0–0.5)
Eosinophils Relative: 2 %
HCT: 30.8 % — ABNORMAL LOW (ref 39.0–52.0)
Hemoglobin: 9.8 g/dL — ABNORMAL LOW (ref 13.0–17.0)
Immature Granulocytes: 0 %
Lymphocytes Relative: 16 %
Lymphs Abs: 1.1 10*3/uL (ref 0.7–4.0)
MCH: 25.7 pg — ABNORMAL LOW (ref 26.0–34.0)
MCHC: 31.8 g/dL (ref 30.0–36.0)
MCV: 80.6 fL (ref 80.0–100.0)
Monocytes Absolute: 0.9 10*3/uL (ref 0.1–1.0)
Monocytes Relative: 14 %
Neutro Abs: 4.6 10*3/uL (ref 1.7–7.7)
Neutrophils Relative %: 68 %
Platelet Count: 168 10*3/uL (ref 150–400)
RBC: 3.82 MIL/uL — ABNORMAL LOW (ref 4.22–5.81)
RDW: 14.1 % (ref 11.5–15.5)
WBC Count: 6.7 10*3/uL (ref 4.0–10.5)
nRBC: 0.3 % — ABNORMAL HIGH (ref 0.0–0.2)

## 2020-06-19 MED ORDER — DEXAMETHASONE 4 MG PO TABS
40.0000 mg | ORAL_TABLET | Freq: Once | ORAL | Status: AC
  Administered 2020-06-19: 40 mg via ORAL

## 2020-06-19 MED ORDER — BORTEZOMIB CHEMO SQ INJECTION 3.5 MG (2.5MG/ML)
1.3000 mg/m2 | Freq: Once | INTRAMUSCULAR | Status: AC
  Administered 2020-06-19: 2.75 mg via SUBCUTANEOUS
  Filled 2020-06-19: qty 1.1

## 2020-06-19 MED ORDER — DEXAMETHASONE 4 MG PO TABS
ORAL_TABLET | ORAL | Status: AC
  Filled 2020-06-19: qty 10

## 2020-06-19 MED ORDER — PROCHLORPERAZINE MALEATE 10 MG PO TABS
10.0000 mg | ORAL_TABLET | Freq: Once | ORAL | Status: AC
  Administered 2020-06-19: 10 mg via ORAL

## 2020-06-19 MED ORDER — PROCHLORPERAZINE MALEATE 10 MG PO TABS
ORAL_TABLET | ORAL | Status: AC
  Filled 2020-06-19: qty 1

## 2020-06-19 NOTE — Patient Instructions (Signed)
[facility] Discharge Instructions for Patients Receiving Chemotherapy  Today you received the following chemotherapy agents Velcade.  To help prevent nausea and vomiting after your treatment, we encourage you to take your nausea medication as directed.  If you develop nausea and vomiting that is not controlled by your nausea medication, call the clinic.   BELOW ARE SYMPTOMS THAT SHOULD BE REPORTED IMMEDIATELY:  *FEVER GREATER THAN 100.5 F  *CHILLS WITH OR WITHOUT FEVER  NAUSEA AND VOMITING THAT IS NOT CONTROLLED WITH YOUR NAUSEA MEDICATION  *UNUSUAL SHORTNESS OF BREATH  *UNUSUAL BRUISING OR BLEEDING  TENDERNESS IN MOUTH AND THROAT WITH OR WITHOUT PRESENCE OF ULCERS  *URINARY PROBLEMS  *BOWEL PROBLEMS  UNUSUAL RASH Items with * indicate a potential emergency and should be followed up as soon as possible.  Feel free to call the clinic should you have any questions or concerns. The clinic phone number is [phone].  Please show the CHEMO ALERT CARD at check-in to the Emergency Department and triage nurse.   

## 2020-06-24 DIAGNOSIS — M25561 Pain in right knee: Secondary | ICD-10-CM | POA: Diagnosis not present

## 2020-06-24 DIAGNOSIS — M1712 Unilateral primary osteoarthritis, left knee: Secondary | ICD-10-CM | POA: Diagnosis not present

## 2020-06-24 DIAGNOSIS — M1711 Unilateral primary osteoarthritis, right knee: Secondary | ICD-10-CM | POA: Diagnosis not present

## 2020-06-24 DIAGNOSIS — M25461 Effusion, right knee: Secondary | ICD-10-CM | POA: Diagnosis not present

## 2020-06-24 DIAGNOSIS — S8991XA Unspecified injury of right lower leg, initial encounter: Secondary | ICD-10-CM | POA: Diagnosis not present

## 2020-06-25 ENCOUNTER — Other Ambulatory Visit: Payer: Self-pay | Admitting: *Deleted

## 2020-06-25 DIAGNOSIS — C9000 Multiple myeloma not having achieved remission: Secondary | ICD-10-CM

## 2020-06-26 ENCOUNTER — Inpatient Hospital Stay: Payer: Medicare Other

## 2020-06-26 ENCOUNTER — Other Ambulatory Visit: Payer: Self-pay

## 2020-06-26 VITALS — BP 137/65 | HR 62 | Temp 98.0°F | Resp 18 | Wt 199.2 lb

## 2020-06-26 DIAGNOSIS — C9000 Multiple myeloma not having achieved remission: Secondary | ICD-10-CM

## 2020-06-26 DIAGNOSIS — D649 Anemia, unspecified: Secondary | ICD-10-CM | POA: Diagnosis not present

## 2020-06-26 DIAGNOSIS — Z5112 Encounter for antineoplastic immunotherapy: Secondary | ICD-10-CM | POA: Diagnosis not present

## 2020-06-26 LAB — CBC WITH DIFFERENTIAL (CANCER CENTER ONLY)
Abs Immature Granulocytes: 0.02 10*3/uL (ref 0.00–0.07)
Basophils Absolute: 0 10*3/uL (ref 0.0–0.1)
Basophils Relative: 0 %
Eosinophils Absolute: 0.1 10*3/uL (ref 0.0–0.5)
Eosinophils Relative: 1 %
HCT: 32.1 % — ABNORMAL LOW (ref 39.0–52.0)
Hemoglobin: 10.2 g/dL — ABNORMAL LOW (ref 13.0–17.0)
Immature Granulocytes: 0 %
Lymphocytes Relative: 14 %
Lymphs Abs: 1 10*3/uL (ref 0.7–4.0)
MCH: 26.3 pg (ref 26.0–34.0)
MCHC: 31.8 g/dL (ref 30.0–36.0)
MCV: 82.7 fL (ref 80.0–100.0)
Monocytes Absolute: 0.9 10*3/uL (ref 0.1–1.0)
Monocytes Relative: 13 %
Neutro Abs: 5 10*3/uL (ref 1.7–7.7)
Neutrophils Relative %: 72 %
Platelet Count: 165 10*3/uL (ref 150–400)
RBC: 3.88 MIL/uL — ABNORMAL LOW (ref 4.22–5.81)
RDW: 14.2 % (ref 11.5–15.5)
WBC Count: 7 10*3/uL (ref 4.0–10.5)
nRBC: 0.4 % — ABNORMAL HIGH (ref 0.0–0.2)

## 2020-06-26 LAB — CMP (CANCER CENTER ONLY)
ALT: 14 U/L (ref 0–44)
AST: 19 U/L (ref 15–41)
Albumin: 3.7 g/dL (ref 3.5–5.0)
Alkaline Phosphatase: 82 U/L (ref 38–126)
Anion gap: 8 (ref 5–15)
BUN: 16 mg/dL (ref 8–23)
CO2: 31 mmol/L (ref 22–32)
Calcium: 9.9 mg/dL (ref 8.9–10.3)
Chloride: 102 mmol/L (ref 98–111)
Creatinine: 1.06 mg/dL (ref 0.61–1.24)
GFR, Est AFR Am: >60 mL/min (ref >60)
GFR, Estimated: >60 mL/min (ref >60)
Glucose, Bld: 101 mg/dL — ABNORMAL HIGH (ref 70–99)
Potassium: 4.1 mmol/L (ref 3.5–5.1)
Sodium: 141 mmol/L (ref 135–145)
Total Bilirubin: 0.6 mg/dL (ref 0.3–1.2)
Total Protein: 6.9 g/dL (ref 6.5–8.1)

## 2020-06-26 MED ORDER — ACETAMINOPHEN 325 MG PO TABS
ORAL_TABLET | ORAL | Status: AC
  Filled 2020-06-26: qty 2

## 2020-06-26 MED ORDER — BORTEZOMIB CHEMO SQ INJECTION 3.5 MG (2.5MG/ML)
1.3000 mg/m2 | Freq: Once | INTRAMUSCULAR | Status: AC
  Administered 2020-06-26: 2.75 mg via SUBCUTANEOUS
  Filled 2020-06-26: qty 1.1

## 2020-06-26 MED ORDER — DEXAMETHASONE 4 MG PO TABS
ORAL_TABLET | ORAL | Status: AC
  Filled 2020-06-26: qty 5

## 2020-06-26 MED ORDER — DEXAMETHASONE 4 MG PO TABS
40.0000 mg | ORAL_TABLET | Freq: Once | ORAL | Status: AC
  Administered 2020-06-26: 40 mg via ORAL

## 2020-06-26 MED ORDER — PROCHLORPERAZINE MALEATE 10 MG PO TABS
10.0000 mg | ORAL_TABLET | Freq: Once | ORAL | Status: AC
  Administered 2020-06-26: 10 mg via ORAL

## 2020-06-26 MED ORDER — PROCHLORPERAZINE MALEATE 10 MG PO TABS
ORAL_TABLET | ORAL | Status: AC
  Filled 2020-06-26: qty 1

## 2020-06-26 NOTE — Patient Instructions (Signed)
[facility] Discharge Instructions for Patients Receiving Chemotherapy  Today you received the following chemotherapy agents Velcade.  To help prevent nausea and vomiting after your treatment, we encourage you to take your nausea medication as directed.  If you develop nausea and vomiting that is not controlled by your nausea medication, call the clinic.   BELOW ARE SYMPTOMS THAT SHOULD BE REPORTED IMMEDIATELY:  *FEVER GREATER THAN 100.5 F  *CHILLS WITH OR WITHOUT FEVER  NAUSEA AND VOMITING THAT IS NOT CONTROLLED WITH YOUR NAUSEA MEDICATION  *UNUSUAL SHORTNESS OF BREATH  *UNUSUAL BRUISING OR BLEEDING  TENDERNESS IN MOUTH AND THROAT WITH OR WITHOUT PRESENCE OF ULCERS  *URINARY PROBLEMS  *BOWEL PROBLEMS  UNUSUAL RASH Items with * indicate a potential emergency and should be followed up as soon as possible.  Feel free to call the clinic should you have any questions or concerns. The clinic phone number is [phone].  Please show the CHEMO ALERT CARD at check-in to the Emergency Department and triage nurse.   

## 2020-06-27 MED ORDER — PROCHLORPERAZINE MALEATE 10 MG PO TABS
ORAL_TABLET | ORAL | Status: AC
  Filled 2020-06-27: qty 1

## 2020-06-27 MED ORDER — DEXAMETHASONE 4 MG PO TABS
ORAL_TABLET | ORAL | Status: AC
  Filled 2020-06-27: qty 10

## 2020-07-02 ENCOUNTER — Other Ambulatory Visit: Payer: Self-pay | Admitting: Oncology

## 2020-07-02 DIAGNOSIS — D472 Monoclonal gammopathy: Secondary | ICD-10-CM

## 2020-07-02 NOTE — Progress Notes (Signed)
[place]  [phone] [phone]) [phone]     ID: Joe [name] DOB: [birth year]  MR#: [id number]  XMI#:[id number]  Patient Care Team: Jani Gravel, MD as PCP - General (Internal Medicine) Canden Cieslinski, Virgie Dad, MD as Consulting Physician (Oncology) Dr. Iantha Fallen, D.D.S., PA Chauncey Cruel, MD OTHER MD:  CHIEF COMPLAINT: multiple myeloma  CURRENT TREATMENT: Bortezomib, Dexamethasone, denosumab Delton See   INTERVAL HISTORY: Joe [name] returns today for follow up and treatment of his multiple myeloma.   He continues on bortezomib.  He tolerates this without any side effects that he is aware of.  At night he feels his feet may be a little bit numb or just cold.  This is not a new problem and it does not seem to be any worse than before.  He also receives dexamethasone weekly.  He has had no side effects from this that he is aware of  We are following chiefly his M spike M Protein SerPl Elph-Mcnc Not Observed g/dL 0.7High VC  0.9High VC  1.1High VC  3.8High VC    IgG (Immunoglobin G), Serum 603 - 1,613 mg/dL 1,176  1,261  1,614High  5,947High      REVIEW OF SYSTEMS: Joe [name] is doing a lot of gardening, growing tomatoes, piece, and other vegetables.  He had the [place] vaccine x2 with no complications.  A detailed review of systems today was otherwise noncontributory   HISTORY OF CURRENT ILLNESS: From the original intake note:  Joe [name] is an 86 y/o [place] man formerly followed by my partner Dr Julien Nordmann for M-GUS. We have an M-Spike of 0.87 documented 07/12/2012. [name] kappa/lambda light chains and total IgG were follower yearly until 01/2016, when they were 2.36 and 1517 respectively (unremarkable). He was lost to follow-up after that point. Yesterday [02/29/2020] the patient presented to Urgent Care with poorly controlled pain. This was felt to be mussculoskeletal and he was started on a medrol dose-pak and robaxin. However his pain worsened  and he presented to the ED at [facility] where vitals and exam were unremarkable but labs showed a creatinine of 1.84 (baseline 1.0), calcium 12.7 with albumin 2.8 and total protein 10.4 (globulin fraction 7.8). CT of the abdomen obtained for evaluation of his abdominal and back pain showed no hydronephrosis but multiple lytic lesons. We were consulted for further evaluation and treatment of likely multiple myeloma and he was admitted to the hospital.   The patient's subsequent history is as detailed below.   PAST MEDICAL HISTORY: Past Medical History:  Diagnosis Date  . Enlarged prostate   . Hypertension     PAST SURGICAL HISTORY: Past Surgical History:  Procedure Laterality Date  . EYE SURGERY    . PROSTATE SURGERY      FAMILY HISTORY Family History  Problem Relation Age of Onset  . Hypertension Mother     SOCIAL HISTORY:  Retired, used to work for the [employer] Lives with wife [place]. Has 4 children, 2 in [place], one in [place], one in [place]; 5 grandchildren and 5 great-grandchildren. Attands a [employer].    ADVANCED DIRECTIVES: at the initial visit the patient confirmed his wife is his HCPOA (despite her memory issues).   HEALTH MAINTENANCE: Social History   Tobacco Use  . Smoking status: Never Smoker  . Smokeless tobacco: Never Used  Vaping Use  . Vaping Use: Never used  Substance Use Topics  . Alcohol use: Never  . Drug use: Never  Colonoscopy: 6-8 years ago     No Known Allergies  Current Outpatient Medications  Medication Sig Dispense Refill  . acetaminophen (TYLENOL) 500 MG tablet Take 1,000 mg by mouth every 6 (six) hours as needed for mild pain.    [name] acyclovir (ZOVIRAX) 400 MG tablet Take 1 tablet (400 mg total) by mouth 2 (two) times daily. 60 tablet 3  . amLODipine (NORVASC) 5 MG tablet Take 5 mg by mouth daily.    . calcium carbonate (TUMS - DOSED IN MG ELEMENTAL CALCIUM) 500 MG chewable tablet  Chew 3 tablets by mouth daily.    [name] doxazosin (CARDURA) 4 MG tablet Take 4 mg by mouth at bedtime.    . famotidine (PEPCID) 20 MG tablet Take 1 tablet by mouth once daily 30 tablet 0  . finasteride (PROSCAR) 5 MG tablet Take 1 tablet by mouth once daily 30 tablet 0  . indapamide (LOZOL) 2.5 MG tablet Take 2.5 mg by mouth daily.     . magnesium citrate SOLN Take 1/2 bottle if no bowel movement in 2 hours repeat (Patient not taking: Reported on 04/24/2020) 195 mL 1  . polyethylene glycol powder (MIRALAX) 17 GM/SCOOP powder Take 255 g by mouth daily. 255 g 0  . pravastatin (PRAVACHOL) 40 MG tablet Take 40 mg by mouth daily.    . prochlorperazine (COMPAZINE) 10 MG tablet Take 1 tablet (10 mg total) by mouth every 6 (six) hours as needed (Nausea or vomiting). 30 tablet 1  . senna-docusate (SENOKOT-S) 8.6-50 MG tablet Take 2 tablets by mouth 2 (two) times daily. (Patient taking differently: Take 2 tablets by mouth 2 (two) times daily as needed. ) 120 tablet 0  . tamsulosin (FLOMAX) 0.4 MG CAPS capsule Take 1 capsule (0.4 mg total) by mouth daily after supper. 30 capsule 6  . traMADol (ULTRAM) 50 MG tablet Take 1 tablet (50 mg total) by mouth every 8 (eight) hours as needed for severe pain. 60 tablet 0  . vitamin B-12 (CYANOCOBALAMIN) 1000 MCG tablet Take 1,000 mcg by mouth daily.     No current facility-administered medications for this visit.    OBJECTIVE: African-American man in no acute distress  Vitals:   07/03/20 1052  BP: (!) 146/70  Pulse: 66  Resp: 18  Temp: 98.2 F (36.8 C)  SpO2: 97%     Body mass index is 27.99 kg/m.   Wt Readings from Last 3 Encounters:  07/03/20 200 lb 11.2 oz (91 kg)  06/26/20 199 lb 4 oz (90.4 kg)  06/12/20 199 lb 12 oz (90.6 kg)      ECOG FS:1 - Symptomatic but completely ambulatory  Sclerae unicteric, EOMs intact Wearing a mask No cervical or supraclavicular adenopathy Lungs no rales or rhonchi Heart regular rate and rhythm Abd soft, nontender,  positive bowel sounds MSK no focal spinal tenderness, no upper extremity lymphedema Neuro: nonfocal, well oriented, appropriate affect   LAB RESULTS:  CMP     Component Value Date/Time   NA 141 06/26/2020 1313   NA 140 01/09/2016 1045   K 4.1 06/26/2020 1313   K 3.8 01/09/2016 1045   CL 102 06/26/2020 1313   CO2 31 06/26/2020 1313   CO2 27 01/09/2016 1045   GLUCOSE 101 (H) 06/26/2020 1313   GLUCOSE 89 01/09/2016 1045   BUN 16 06/26/2020 1313   BUN 14.7 01/09/2016 1045   CREATININE 1.06 06/26/2020 1313   CREATININE 1.0 01/09/2016 1045   CALCIUM 9.9 06/26/2020 1313   CALCIUM 9.2 01/09/2016  1045   PROT 6.9 06/26/2020 1313   PROT 7.3 01/09/2016 1045   ALBUMIN 3.7 06/26/2020 1313   ALBUMIN 3.7 01/09/2016 1045   AST 19 06/26/2020 1313   AST 18 01/09/2016 1045   ALT 14 06/26/2020 1313   ALT 15 01/09/2016 1045   ALKPHOS 82 06/26/2020 1313   ALKPHOS 60 01/09/2016 1045   BILITOT 0.6 06/26/2020 1313   BILITOT 0.51 01/09/2016 1045   GFRNONAA >60 06/26/2020 1313   GFRAA >60 06/26/2020 1313    Lab Results  Component Value Date   TOTALPROTELP 6.3 06/12/2020   ALBUMINELP 56.1 07/12/2012   A1GS 3.5 07/12/2012   A2GS 9.8 07/12/2012   BETS 5.1 07/12/2012   BETA2SER 3.6 07/12/2012   GAMS 21.9 (H) 07/12/2012   MSPIKE 0.87 07/12/2012   SPEI * 07/12/2012     Lab Results  Component Value Date   KPAFRELGTCHN 36.7 (H) 06/12/2020   LAMBDASER 5.9 06/12/2020   KAPLAMBRATIO 6.22 (H) 06/12/2020    Lab Results  Component Value Date   WBC 7.0 06/26/2020   NEUTROABS 5.0 06/26/2020   HGB 10.2 (L) 06/26/2020   HCT 32.1 (L) 06/26/2020   MCV 82.7 06/26/2020   PLT 165 06/26/2020      Chemistry      Component Value Date/Time   NA 141 06/26/2020 1313   NA 140 01/09/2016 1045   K 4.1 06/26/2020 1313   K 3.8 01/09/2016 1045   CL 102 06/26/2020 1313   CO2 31 06/26/2020 1313   CO2 27 01/09/2016 1045   BUN 16 06/26/2020 1313   BUN 14.7 01/09/2016 1045   CREATININE 1.06  06/26/2020 1313   CREATININE 1.0 01/09/2016 1045      Component Value Date/Time   CALCIUM 9.9 06/26/2020 1313   CALCIUM 9.2 01/09/2016 1045   ALKPHOS 82 06/26/2020 1313   ALKPHOS 60 01/09/2016 1045   AST 19 06/26/2020 1313   AST 18 01/09/2016 1045   ALT 14 06/26/2020 1313   ALT 15 01/09/2016 1045   BILITOT 0.6 06/26/2020 1313   BILITOT 0.51 01/09/2016 1045       No results found for: LABCA2  No components found for: OEHOZY248  No results for input(s): INR in the last 168 hours.  No results found for: LABCA2  No results found for: GNO037  No results found for: CWU889  No results found for: VQX450  No results found for: CA2729  No components found for: HGQUANT  No results found for: CEA1 / No results found for: CEA1   No results found for: AFPTUMOR  No results found for: CHROMOGRNA  No results found for: HGBA, HGBA2QUANT, HGBFQUANT, HGBSQUAN (Hemoglobinopathy evaluation)   Lab Results  Component Value Date   LDH 136 01/09/2016    Lab Results  Component Value Date   IRON 49 03/01/2020   TIBC 238 (L) 03/01/2020   IRONPCTSAT 21 03/01/2020   (Iron and TIBC)  Lab Results  Component Value Date   FERRITIN 375 (H) 03/01/2020    Urinalysis    Component Value Date/Time   COLORURINE AMBER (A) 03/01/2020 1052   APPEARANCEUR CLOUDY (A) 03/01/2020 1052   LABSPEC 1.021 03/01/2020 1052   PHURINE 5.0 03/01/2020 1052   GLUCOSEU NEGATIVE 03/01/2020 1052   HGBUR NEGATIVE 03/01/2020 [place] 03/01/2020 1052   KETONESUR NEGATIVE 03/01/2020 1052   PROTEINUR 100 (A) 03/01/2020 1052   UROBILINOGEN 0.2 02/29/2020 1537   NITRITE NEGATIVE 03/01/2020 [place] 03/01/2020 1052  STUDIES: No results found.   ELIGIBLE FOR AVAILABLE RESEARCH PROTOCOL:   ASSESSMENT: 86 y.o.  41 [place] man with a history of M-GUS, presenting 02/29/2020 with a high globulin fraction, worsening renal function, hypercalcemia, anemia and multiple  lytic bone lesions.   (1) IgG Kappa Multiple Myeloma: Labs diagnostic on 03/01/2020 with Mspike of 3.8, Kappa free light chain 690.6, and ratio 117.05.  (a) CT A/P on 03/01/2020 shows "innumerable" lytic lesions involving all visualized bones  (b) bone marrow biopsy 03/11/2020 shows 20% plasmacytosis by CD138, 12% by manual differential; molecular studies not requested  (c) Bortezomib and Dexamethasone given weekly 3 weeks on and 1 week off beginning 03/11/2020    (2) lytic bone lesions/ hypercalcemia:  (a) Pamidronate administered on 03/01/2020-- resolved  (b) denosumab/Xgeva to start 04/17/2020-- repeat every 12 weeks   (a) take TUMS 3 tabs on day of Xgeva dose   PLAN: Joe [name] is doing very well with his current treatment for myeloma.  He tells me he has more energy and is doing more, particularly gardening.  He is enjoying things more as well.  The only concern is very mild neuropathy in his feet, which is not a new issue, and which appears to not be progressing.  The plan is to continue the current treatment so as there is continuing improvement.  We are not yet at plateau but we are close as his M spike is down to 0.7 at this point.  His kappa lambda ratio is approaching normal.  Once we reach plateau we will stop the bortezomib/dexamethasone and switch him to lenalidomide  He will see me again in about a month.  He knows to call for any other issues that may develop before then  Total encounter time 25 minutes.Sarajane Jews C. Male Iafrate, MD 07/03/20 6:18 PM Medical Oncology and Hematology [facility] [place], [place] [address] Tel. [phone]    Fax. [phone]   I, Wilburn Mylar, am acting as scribe for Dr. Virgie Dad. Mayer Vondrak.  I, Lurline Del MD, have reviewed the above documentation for accuracy and completeness, and I agree with the above.   *Total Encounter Time as defined by the Centers for Medicare and Medicaid Services includes, in  addition to the face-to-face time of a patient visit (documented in the note above) non-face-to-face time: obtaining and reviewing outside history, ordering and reviewing medications, tests or procedures, care coordination (communications with other health care professionals or caregivers) and documentation in the medical record.

## 2020-07-03 ENCOUNTER — Other Ambulatory Visit: Payer: Self-pay | Admitting: *Deleted

## 2020-07-03 ENCOUNTER — Other Ambulatory Visit: Payer: Self-pay

## 2020-07-03 ENCOUNTER — Inpatient Hospital Stay (HOSPITAL_BASED_OUTPATIENT_CLINIC_OR_DEPARTMENT_OTHER): Payer: Medicare Other | Admitting: Oncology

## 2020-07-03 ENCOUNTER — Inpatient Hospital Stay: Payer: Medicare Other

## 2020-07-03 ENCOUNTER — Inpatient Hospital Stay: Payer: Medicare Other | Attending: Adult Health

## 2020-07-03 ENCOUNTER — Telehealth: Payer: Self-pay | Admitting: *Deleted

## 2020-07-03 VITALS — BP 146/70 | HR 66 | Temp 98.2°F | Resp 18 | Ht 71.0 in | Wt 200.7 lb

## 2020-07-03 DIAGNOSIS — Z7189 Other specified counseling: Secondary | ICD-10-CM

## 2020-07-03 DIAGNOSIS — C9000 Multiple myeloma not having achieved remission: Secondary | ICD-10-CM

## 2020-07-03 DIAGNOSIS — D649 Anemia, unspecified: Secondary | ICD-10-CM | POA: Insufficient documentation

## 2020-07-03 DIAGNOSIS — N179 Acute kidney failure, unspecified: Secondary | ICD-10-CM

## 2020-07-03 DIAGNOSIS — Z5112 Encounter for antineoplastic immunotherapy: Secondary | ICD-10-CM | POA: Diagnosis not present

## 2020-07-03 MED ORDER — DEXAMETHASONE 4 MG PO TABS
40.0000 mg | ORAL_TABLET | Freq: Once | ORAL | Status: AC
  Administered 2020-07-03: 40 mg via ORAL

## 2020-07-03 MED ORDER — DEXAMETHASONE 4 MG PO TABS
ORAL_TABLET | ORAL | Status: AC
  Filled 2020-07-03: qty 10

## 2020-07-03 MED ORDER — PROCHLORPERAZINE MALEATE 10 MG PO TABS
10.0000 mg | ORAL_TABLET | Freq: Once | ORAL | Status: AC
  Administered 2020-07-03: 10 mg via ORAL

## 2020-07-03 MED ORDER — PROCHLORPERAZINE MALEATE 10 MG PO TABS
ORAL_TABLET | ORAL | Status: AC
  Filled 2020-07-03: qty 1

## 2020-07-03 MED ORDER — BORTEZOMIB CHEMO SQ INJECTION 3.5 MG (2.5MG/ML)
1.3000 mg/m2 | Freq: Once | INTRAMUSCULAR | Status: AC
  Administered 2020-07-03: 2.75 mg via SUBCUTANEOUS
  Filled 2020-07-03: qty 1.1

## 2020-07-03 NOTE — Telephone Encounter (Signed)
Ok to treat today for day 8 cycle 5 of velcade with lab review of CBC and Cmet from 06/26/2020.  Standing orders for labs entered for future use.

## 2020-07-03 NOTE — Patient Instructions (Signed)
[facility] Discharge Instructions for Patients Receiving Chemotherapy  Today you received the following chemotherapy agents Velcade.  To help prevent nausea and vomiting after your treatment, we encourage you to take your nausea medication as directed.  If you develop nausea and vomiting that is not controlled by your nausea medication, call the clinic.   BELOW ARE SYMPTOMS THAT SHOULD BE REPORTED IMMEDIATELY:  *FEVER GREATER THAN 100.5 F  *CHILLS WITH OR WITHOUT FEVER  NAUSEA AND VOMITING THAT IS NOT CONTROLLED WITH YOUR NAUSEA MEDICATION  *UNUSUAL SHORTNESS OF BREATH  *UNUSUAL BRUISING OR BLEEDING  TENDERNESS IN MOUTH AND THROAT WITH OR WITHOUT PRESENCE OF ULCERS  *URINARY PROBLEMS  *BOWEL PROBLEMS  UNUSUAL RASH Items with * indicate a potential emergency and should be followed up as soon as possible.  Feel free to call the clinic should you have any questions or concerns. The clinic phone number is [phone].  Please show the CHEMO ALERT CARD at check-in to the Emergency Department and triage nurse.   

## 2020-07-04 LAB — KAPPA/LAMBDA LIGHT CHAINS
Kappa free light chain: 31.2 mg/L — ABNORMAL HIGH (ref 3.3–19.4)
Kappa, lambda light chain ratio: 9.18 — ABNORMAL HIGH (ref 0.26–1.65)
Lambda free light chains: 3.4 mg/L — ABNORMAL LOW (ref 5.7–26.3)

## 2020-07-05 LAB — MULTIPLE MYELOMA PANEL, SERUM
Albumin SerPl Elph-Mcnc: 3.5 g/dL (ref 2.9–4.4)
Albumin/Glob SerPl: 1.4 (ref 0.7–1.7)
Alpha 1: 0.2 g/dL (ref 0.0–0.4)
Alpha2 Glob SerPl Elph-Mcnc: 0.6 g/dL (ref 0.4–1.0)
B-Globulin SerPl Elph-Mcnc: 0.8 g/dL (ref 0.7–1.3)
Gamma Glob SerPl Elph-Mcnc: 1 g/dL (ref 0.4–1.8)
Globulin, Total: 2.6 g/dL (ref 2.2–3.9)
IgA: 38 mg/dL — ABNORMAL LOW (ref 61–437)
IgG (Immunoglobin G), Serum: 1074 mg/dL (ref 603–1613)
IgM (Immunoglobulin M), Srm: 21 mg/dL (ref 15–143)
M Protein SerPl Elph-Mcnc: 0.6 g/dL — ABNORMAL HIGH
Total Protein ELP: 6.1 g/dL (ref 6.0–8.5)

## 2020-07-09 ENCOUNTER — Other Ambulatory Visit: Payer: Self-pay | Admitting: Oncology

## 2020-07-09 DIAGNOSIS — M1711 Unilateral primary osteoarthritis, right knee: Secondary | ICD-10-CM | POA: Diagnosis not present

## 2020-07-09 DIAGNOSIS — M25561 Pain in right knee: Secondary | ICD-10-CM | POA: Diagnosis not present

## 2020-07-09 DIAGNOSIS — C9000 Multiple myeloma not having achieved remission: Secondary | ICD-10-CM

## 2020-07-10 ENCOUNTER — Inpatient Hospital Stay: Payer: Medicare Other

## 2020-07-10 ENCOUNTER — Other Ambulatory Visit: Payer: Self-pay

## 2020-07-10 VITALS — BP 145/71 | HR 77 | Temp 98.4°F | Resp 18 | Wt 198.2 lb

## 2020-07-10 DIAGNOSIS — C9000 Multiple myeloma not having achieved remission: Secondary | ICD-10-CM

## 2020-07-10 DIAGNOSIS — Z5112 Encounter for antineoplastic immunotherapy: Secondary | ICD-10-CM | POA: Diagnosis not present

## 2020-07-10 DIAGNOSIS — D649 Anemia, unspecified: Secondary | ICD-10-CM | POA: Diagnosis not present

## 2020-07-10 LAB — CMP (CANCER CENTER ONLY)
ALT: 12 U/L (ref 0–44)
AST: 15 U/L (ref 15–41)
Albumin: 3.8 g/dL (ref 3.5–5.0)
Alkaline Phosphatase: 77 U/L (ref 38–126)
Anion gap: 9 (ref 5–15)
BUN: 23 mg/dL (ref 8–23)
CO2: 26 mmol/L (ref 22–32)
Calcium: 9.9 mg/dL (ref 8.9–10.3)
Chloride: 101 mmol/L (ref 98–111)
Creatinine: 1.15 mg/dL (ref 0.61–1.24)
GFR, Est AFR Am: >60 mL/min (ref >60)
GFR, Estimated: 57 mL/min — ABNORMAL LOW (ref >60)
Glucose, Bld: 98 mg/dL (ref 70–99)
Potassium: 4.4 mmol/L (ref 3.5–5.1)
Sodium: 136 mmol/L (ref 135–145)
Total Bilirubin: 0.7 mg/dL (ref 0.3–1.2)
Total Protein: 7 g/dL (ref 6.5–8.1)

## 2020-07-10 LAB — CBC WITH DIFFERENTIAL (CANCER CENTER ONLY)
Abs Immature Granulocytes: 0.05 10*3/uL (ref 0.00–0.07)
Basophils Absolute: 0 10*3/uL (ref 0.0–0.1)
Basophils Relative: 0 %
Eosinophils Absolute: 0 10*3/uL (ref 0.0–0.5)
Eosinophils Relative: 0 %
HCT: 32.2 % — ABNORMAL LOW (ref 39.0–52.0)
Hemoglobin: 10.4 g/dL — ABNORMAL LOW (ref 13.0–17.0)
Immature Granulocytes: 0 %
Lymphocytes Relative: 4 %
Lymphs Abs: 0.5 10*3/uL — ABNORMAL LOW (ref 0.7–4.0)
MCH: 26.1 pg (ref 26.0–34.0)
MCHC: 32.3 g/dL (ref 30.0–36.0)
MCV: 80.9 fL (ref 80.0–100.0)
Monocytes Absolute: 0.4 10*3/uL (ref 0.1–1.0)
Monocytes Relative: 4 %
Neutro Abs: 11.6 10*3/uL — ABNORMAL HIGH (ref 1.7–7.7)
Neutrophils Relative %: 92 %
Platelet Count: 192 10*3/uL (ref 150–400)
RBC: 3.98 MIL/uL — ABNORMAL LOW (ref 4.22–5.81)
RDW: 14.5 % (ref 11.5–15.5)
WBC Count: 12.6 10*3/uL — ABNORMAL HIGH (ref 4.0–10.5)
nRBC: 0.3 % — ABNORMAL HIGH (ref 0.0–0.2)

## 2020-07-10 MED ORDER — PROCHLORPERAZINE MALEATE 10 MG PO TABS
10.0000 mg | ORAL_TABLET | Freq: Once | ORAL | Status: AC
  Administered 2020-07-10: 10 mg via ORAL

## 2020-07-10 MED ORDER — DEXAMETHASONE 4 MG PO TABS
ORAL_TABLET | ORAL | Status: AC
  Filled 2020-07-10: qty 10

## 2020-07-10 MED ORDER — BORTEZOMIB CHEMO SQ INJECTION 3.5 MG (2.5MG/ML)
1.3000 mg/m2 | Freq: Once | INTRAMUSCULAR | Status: AC
  Administered 2020-07-10: 2.75 mg via SUBCUTANEOUS
  Filled 2020-07-10: qty 1.1

## 2020-07-10 MED ORDER — DEXAMETHASONE 4 MG PO TABS
40.0000 mg | ORAL_TABLET | Freq: Once | ORAL | Status: AC
  Administered 2020-07-10: 40 mg via ORAL

## 2020-07-10 MED ORDER — PROCHLORPERAZINE MALEATE 10 MG PO TABS
ORAL_TABLET | ORAL | Status: AC
  Filled 2020-07-10: qty 1

## 2020-07-10 NOTE — Patient Instructions (Signed)
[facility] Discharge Instructions for Patients Receiving Chemotherapy  Today you received the following chemotherapy agents Velcade.  To help prevent nausea and vomiting after your treatment, we encourage you to take your nausea medication as directed.  If you develop nausea and vomiting that is not controlled by your nausea medication, call the clinic.   BELOW ARE SYMPTOMS THAT SHOULD BE REPORTED IMMEDIATELY:  *FEVER GREATER THAN 100.5 F  *CHILLS WITH OR WITHOUT FEVER  NAUSEA AND VOMITING THAT IS NOT CONTROLLED WITH YOUR NAUSEA MEDICATION  *UNUSUAL SHORTNESS OF BREATH  *UNUSUAL BRUISING OR BLEEDING  TENDERNESS IN MOUTH AND THROAT WITH OR WITHOUT PRESENCE OF ULCERS  *URINARY PROBLEMS  *BOWEL PROBLEMS  UNUSUAL RASH Items with * indicate a potential emergency and should be followed up as soon as possible.  Feel free to call the clinic should you have any questions or concerns. The clinic phone number is [phone].  Please show the CHEMO ALERT CARD at check-in to the Emergency Department and triage nurse.   

## 2020-07-16 DIAGNOSIS — M25561 Pain in right knee: Secondary | ICD-10-CM | POA: Diagnosis not present

## 2020-07-17 ENCOUNTER — Other Ambulatory Visit: Payer: Self-pay

## 2020-07-17 ENCOUNTER — Inpatient Hospital Stay: Payer: Medicare Other

## 2020-07-17 VITALS — BP 147/62 | HR 70 | Temp 97.9°F | Resp 18 | Ht 71.0 in | Wt 195.1 lb

## 2020-07-17 DIAGNOSIS — C9000 Multiple myeloma not having achieved remission: Secondary | ICD-10-CM

## 2020-07-17 DIAGNOSIS — D649 Anemia, unspecified: Secondary | ICD-10-CM | POA: Diagnosis not present

## 2020-07-17 DIAGNOSIS — Z5112 Encounter for antineoplastic immunotherapy: Secondary | ICD-10-CM | POA: Diagnosis not present

## 2020-07-17 LAB — CBC WITH DIFFERENTIAL (CANCER CENTER ONLY)
Abs Immature Granulocytes: 0.02 10*3/uL (ref 0.00–0.07)
Basophils Absolute: 0 10*3/uL (ref 0.0–0.1)
Basophils Relative: 0 %
Eosinophils Absolute: 0.1 10*3/uL (ref 0.0–0.5)
Eosinophils Relative: 1 %
HCT: 31.8 % — ABNORMAL LOW (ref 39.0–52.0)
Hemoglobin: 10.2 g/dL — ABNORMAL LOW (ref 13.0–17.0)
Immature Granulocytes: 0 %
Lymphocytes Relative: 15 %
Lymphs Abs: 0.9 10*3/uL (ref 0.7–4.0)
MCH: 26.4 pg (ref 26.0–34.0)
MCHC: 32.1 g/dL (ref 30.0–36.0)
MCV: 82.2 fL (ref 80.0–100.0)
Monocytes Absolute: 1 10*3/uL (ref 0.1–1.0)
Monocytes Relative: 16 %
Neutro Abs: 4.1 10*3/uL (ref 1.7–7.7)
Neutrophils Relative %: 68 %
Platelet Count: 167 10*3/uL (ref 150–400)
RBC: 3.87 MIL/uL — ABNORMAL LOW (ref 4.22–5.81)
RDW: 14.6 % (ref 11.5–15.5)
WBC Count: 6.1 10*3/uL (ref 4.0–10.5)
nRBC: 0 % (ref 0.0–0.2)

## 2020-07-17 LAB — CMP (CANCER CENTER ONLY)
ALT: 10 U/L (ref 0–44)
AST: 15 U/L (ref 15–41)
Albumin: 3.5 g/dL (ref 3.5–5.0)
Alkaline Phosphatase: 75 U/L (ref 38–126)
Anion gap: 6 (ref 5–15)
BUN: 17 mg/dL (ref 8–23)
CO2: 30 mmol/L (ref 22–32)
Calcium: 9.5 mg/dL (ref 8.9–10.3)
Chloride: 102 mmol/L (ref 98–111)
Creatinine: 0.97 mg/dL (ref 0.61–1.24)
GFR, Est AFR Am: >60 mL/min (ref >60)
GFR, Estimated: >60 mL/min (ref >60)
Glucose, Bld: 85 mg/dL (ref 70–99)
Potassium: 3.7 mmol/L (ref 3.5–5.1)
Sodium: 138 mmol/L (ref 135–145)
Total Bilirubin: 0.8 mg/dL (ref 0.3–1.2)
Total Protein: 6.4 g/dL — ABNORMAL LOW (ref 6.5–8.1)

## 2020-07-17 MED ORDER — DEXAMETHASONE 4 MG PO TABS
ORAL_TABLET | ORAL | Status: AC
  Filled 2020-07-17: qty 10

## 2020-07-17 MED ORDER — BORTEZOMIB CHEMO SQ INJECTION 3.5 MG (2.5MG/ML)
1.3000 mg/m2 | Freq: Once | INTRAMUSCULAR | Status: AC
  Administered 2020-07-17: 2.75 mg via SUBCUTANEOUS
  Filled 2020-07-17: qty 1.1

## 2020-07-17 MED ORDER — PROCHLORPERAZINE MALEATE 10 MG PO TABS
ORAL_TABLET | ORAL | Status: AC
  Filled 2020-07-17: qty 1

## 2020-07-17 MED ORDER — PROCHLORPERAZINE MALEATE 10 MG PO TABS
10.0000 mg | ORAL_TABLET | Freq: Once | ORAL | Status: AC
  Administered 2020-07-17: 10 mg via ORAL

## 2020-07-17 MED ORDER — DEXAMETHASONE 4 MG PO TABS
40.0000 mg | ORAL_TABLET | Freq: Once | ORAL | Status: AC
  Administered 2020-07-17: 40 mg via ORAL

## 2020-07-17 NOTE — Patient Instructions (Signed)
[facility] Discharge Instructions for Patients Receiving Chemotherapy  Today you received the following chemotherapy agents Velcade To help prevent nausea and vomiting after your treatment, we encourage you to take your nausea medication as prescribed.   If you develop nausea and vomiting that is not controlled by your nausea medication, call the clinic.   BELOW ARE SYMPTOMS THAT SHOULD BE REPORTED IMMEDIATELY:  *FEVER GREATER THAN 100.5 F  *CHILLS WITH OR WITHOUT FEVER  NAUSEA AND VOMITING THAT IS NOT CONTROLLED WITH YOUR NAUSEA MEDICATION  *UNUSUAL SHORTNESS OF BREATH  *UNUSUAL BRUISING OR BLEEDING  TENDERNESS IN MOUTH AND THROAT WITH OR WITHOUT PRESENCE OF ULCERS  *URINARY PROBLEMS  *BOWEL PROBLEMS  UNUSUAL RASH Items with * indicate a potential emergency and should be followed up as soon as possible.  Feel free to call the clinic should you have any questions or concerns. The clinic phone number is [phone].  Please show the CHEMO ALERT CARD at check-in to the Emergency Department and triage nurse.   

## 2020-07-24 ENCOUNTER — Inpatient Hospital Stay: Payer: Medicare Other

## 2020-07-24 ENCOUNTER — Other Ambulatory Visit: Payer: Self-pay

## 2020-07-24 ENCOUNTER — Telehealth: Payer: Self-pay | Admitting: *Deleted

## 2020-07-24 VITALS — BP 137/74 | HR 67 | Temp 98.1°F | Resp 18

## 2020-07-24 DIAGNOSIS — Z5112 Encounter for antineoplastic immunotherapy: Secondary | ICD-10-CM | POA: Diagnosis not present

## 2020-07-24 DIAGNOSIS — C9000 Multiple myeloma not having achieved remission: Secondary | ICD-10-CM | POA: Diagnosis not present

## 2020-07-24 DIAGNOSIS — Z7189 Other specified counseling: Secondary | ICD-10-CM

## 2020-07-24 DIAGNOSIS — D649 Anemia, unspecified: Secondary | ICD-10-CM | POA: Diagnosis not present

## 2020-07-24 LAB — CBC WITH DIFFERENTIAL (CANCER CENTER ONLY)
Abs Immature Granulocytes: 0.02 10*3/uL (ref 0.00–0.07)
Basophils Absolute: 0 10*3/uL (ref 0.0–0.1)
Basophils Relative: 0 %
Eosinophils Absolute: 0.2 10*3/uL (ref 0.0–0.5)
Eosinophils Relative: 2 %
HCT: 31.2 % — ABNORMAL LOW (ref 39.0–52.0)
Hemoglobin: 10 g/dL — ABNORMAL LOW (ref 13.0–17.0)
Immature Granulocytes: 0 %
Lymphocytes Relative: 12 %
Lymphs Abs: 0.8 10*3/uL (ref 0.7–4.0)
MCH: 26.4 pg (ref 26.0–34.0)
MCHC: 32.1 g/dL (ref 30.0–36.0)
MCV: 82.3 fL (ref 80.0–100.0)
Monocytes Absolute: 1 10*3/uL (ref 0.1–1.0)
Monocytes Relative: 13 %
Neutro Abs: 5.3 10*3/uL (ref 1.7–7.7)
Neutrophils Relative %: 73 %
Platelet Count: 133 10*3/uL — ABNORMAL LOW (ref 150–400)
RBC: 3.79 MIL/uL — ABNORMAL LOW (ref 4.22–5.81)
RDW: 14.6 % (ref 11.5–15.5)
WBC Count: 7.2 10*3/uL (ref 4.0–10.5)
nRBC: 0.4 % — ABNORMAL HIGH (ref 0.0–0.2)

## 2020-07-24 MED ORDER — DENOSUMAB 120 MG/1.7ML ~~LOC~~ SOLN
120.0000 mg | Freq: Once | SUBCUTANEOUS | Status: AC
  Administered 2020-07-24: 120 mg via SUBCUTANEOUS

## 2020-07-24 MED ORDER — DENOSUMAB 120 MG/1.7ML ~~LOC~~ SOLN
SUBCUTANEOUS | Status: AC
  Filled 2020-07-24: qty 1.7

## 2020-07-24 MED ORDER — BORTEZOMIB CHEMO SQ INJECTION 3.5 MG (2.5MG/ML)
1.3000 mg/m2 | Freq: Once | INTRAMUSCULAR | Status: AC
  Administered 2020-07-24: 2.75 mg via SUBCUTANEOUS
  Filled 2020-07-24: qty 1.1

## 2020-07-24 MED ORDER — PROCHLORPERAZINE MALEATE 10 MG PO TABS
ORAL_TABLET | ORAL | Status: AC
  Filled 2020-07-24: qty 1

## 2020-07-24 MED ORDER — DEXAMETHASONE 4 MG PO TABS
40.0000 mg | ORAL_TABLET | Freq: Once | ORAL | Status: AC
  Administered 2020-07-24: 40 mg via ORAL

## 2020-07-24 MED ORDER — DEXAMETHASONE 4 MG PO TABS
ORAL_TABLET | ORAL | Status: AC
  Filled 2020-07-24: qty 10

## 2020-07-24 MED ORDER — PROCHLORPERAZINE MALEATE 10 MG PO TABS
10.0000 mg | ORAL_TABLET | Freq: Once | ORAL | Status: AC
  Administered 2020-07-24: 10 mg via ORAL

## 2020-07-24 NOTE — Patient Instructions (Signed)
[facility] Discharge Instructions for Patients Receiving Chemotherapy  Today you received the following chemotherapy agents: Velcade.  To help prevent nausea and vomiting after your treatment, we encourage you to take your nausea medication as directed.   If you develop nausea and vomiting that is not controlled by your nausea medication, call the clinic.   BELOW ARE SYMPTOMS THAT SHOULD BE REPORTED IMMEDIATELY:  *FEVER GREATER THAN 100.5 F  *CHILLS WITH OR WITHOUT FEVER  NAUSEA AND VOMITING THAT IS NOT CONTROLLED WITH YOUR NAUSEA MEDICATION  *UNUSUAL SHORTNESS OF BREATH  *UNUSUAL BRUISING OR BLEEDING  TENDERNESS IN MOUTH AND THROAT WITH OR WITHOUT PRESENCE OF ULCERS  *URINARY PROBLEMS  *BOWEL PROBLEMS  UNUSUAL RASH Items with * indicate a potential emergency and should be followed up as soon as possible.  Feel free to call the clinic should you have any questions or concerns. The clinic phone number is [phone].  Please show the CHEMO ALERT CARD at check-in to the Emergency Department and triage nurse.   Denosumab injection What is this medicine? DENOSUMAB (den oh sue mab) slows bone breakdown. Prolia is used to treat osteoporosis in women after menopause and in men, and in people who are taking corticosteroids for 6 months or more. Xgeva is used to treat a high calcium level due to cancer and to prevent bone fractures and other bone problems caused by multiple myeloma or cancer bone metastases. Xgeva is also used to treat giant cell tumor of the bone. This medicine may be used for other purposes; ask your health care provider or pharmacist if you have questions. COMMON BRAND NAME(S): Prolia, XGEVA What should I tell my health care provider before I take this medicine? They need to know if you have any of these conditions:  dental disease  having surgery or tooth extraction  infection  kidney disease  low levels of calcium or Vitamin D in the  blood  malnutrition  on hemodialysis  skin conditions or sensitivity  thyroid or parathyroid disease  an unusual reaction to denosumab, other medicines, foods, dyes, or preservatives  pregnant or trying to get pregnant  breast-feeding How should I use this medicine? This medicine is for injection under the skin. It is given by a health care professional in a hospital or clinic setting. A special MedGuide will be given to you before each treatment. Be sure to read this information carefully each time. For Prolia, talk to your pediatrician regarding the use of this medicine in children. Special care may be needed. For Xgeva, talk to your pediatrician regarding the use of this medicine in children. While this drug may be prescribed for children as young as 13 years for selected conditions, precautions do apply. Overdosage: If you think you have taken too much of this medicine contact a poison control center or emergency room at once. NOTE: This medicine is only for you. Do not share this medicine with others. What if I miss a dose? It is important not to miss your dose. Call your doctor or health care professional if you are unable to keep an appointment. What may interact with this medicine? Do not take this medicine with any of the following medications:  other medicines containing denosumab This medicine may also interact with the following medications:  medicines that lower your chance of fighting infection  steroid medicines like prednisone or cortisone This list may not describe all possible interactions. Give your health care provider a list of all the medicines, herbs,   drugs, or dietary supplements you use. Also tell them if you smoke, drink alcohol, or use illegal drugs. Some items may interact with your medicine. What should I watch for while using this medicine? Visit your doctor or health care professional for regular checks on your progress. Your doctor or  health care professional may order blood tests and other tests to see how you are doing. Call your doctor or health care professional for advice if you get a fever, chills or sore throat, or other symptoms of a cold or flu. Do not treat yourself. This drug may decrease your body's ability to fight infection. Try to avoid being around people who are sick. You should make sure you get enough calcium and vitamin D while you are taking this medicine, unless your doctor tells you not to. Discuss the foods you eat and the vitamins you take with your health care professional. See your dentist regularly. Brush and floss your teeth as directed. Before you have any dental work done, tell your dentist you are receiving this medicine. Do not become pregnant while taking this medicine or for 5 months after stopping it. Talk with your doctor or health care professional about your birth control options while taking this medicine. Women should inform their doctor if they wish to become pregnant or think they might be pregnant. There is a potential for serious side effects to an unborn child. Talk to your health care professional or pharmacist for more information. What side effects may I notice from receiving this medicine? Side effects that you should report to your doctor or health care professional as soon as possible:  allergic reactions like skin rash, itching or hives, swelling of the face, lips, or tongue  bone pain  breathing problems  dizziness  jaw pain, especially after dental work  redness, blistering, peeling of the skin  signs and symptoms of infection like fever or chills; cough; sore throat; pain or trouble passing urine  signs of low calcium like fast heartbeat, muscle cramps or muscle pain; pain, tingling, numbness in the hands or feet; seizures  unusual bleeding or bruising  unusually weak or tired Side effects that usually do not require medical attention (report to your doctor or  health care professional if they continue or are bothersome):  constipation  diarrhea  headache  joint pain  loss of appetite  muscle pain  runny nose  tiredness  upset stomach This list may not describe all possible side effects. Call your doctor for medical advice about side effects. You may report side effects to FDA at 1-800-FDA-1088. Where should I keep my medicine? This medicine is only given in a clinic, doctor's office, or other health care setting and will not be stored at home. NOTE: This sheet is a summary. It may not cover all possible information. If you have questions about this medicine, talk to your doctor, pharmacist, or health care provider.  2020 Elsevier/Gold Standard (2018-03-25 16:10:44)  

## 2020-07-24 NOTE — Telephone Encounter (Signed)
Per MD ok to use the Cmet results for velcade injection today drawn on 07/17/2020.

## 2020-07-27 ENCOUNTER — Other Ambulatory Visit: Payer: Self-pay | Admitting: Oncology

## 2020-07-30 ENCOUNTER — Other Ambulatory Visit: Payer: Self-pay | Admitting: Oncology

## 2020-07-30 DIAGNOSIS — D472 Monoclonal gammopathy: Secondary | ICD-10-CM

## 2020-07-31 ENCOUNTER — Inpatient Hospital Stay: Payer: Medicare Other | Attending: Adult Health | Admitting: Oncology

## 2020-07-31 ENCOUNTER — Other Ambulatory Visit: Payer: Self-pay

## 2020-07-31 ENCOUNTER — Telehealth: Payer: Self-pay | Admitting: Pharmacist

## 2020-07-31 ENCOUNTER — Telehealth: Payer: Self-pay | Admitting: Oncology

## 2020-07-31 ENCOUNTER — Inpatient Hospital Stay: Payer: Medicare Other | Admitting: Adult Health

## 2020-07-31 ENCOUNTER — Telehealth: Payer: Self-pay

## 2020-07-31 ENCOUNTER — Ambulatory Visit: Payer: Medicare Other | Admitting: Physician Assistant

## 2020-07-31 ENCOUNTER — Other Ambulatory Visit: Payer: Self-pay | Admitting: Pharmacist

## 2020-07-31 ENCOUNTER — Ambulatory Visit (HOSPITAL_COMMUNITY)
Admission: RE | Admit: 2020-07-31 | Discharge: 2020-07-31 | Disposition: A | Discharge status: Home / Self Care | Payer: Medicare Other | Source: Ambulatory Visit | Attending: Oncology | Admitting: Oncology

## 2020-07-31 VITALS — BP 138/67 | HR 73 | Temp 97.5°F | Resp 18 | Ht 71.0 in | Wt 196.4 lb

## 2020-07-31 DIAGNOSIS — C9000 Multiple myeloma not having achieved remission: Secondary | ICD-10-CM

## 2020-07-31 DIAGNOSIS — Z79899 Other long term (current) drug therapy: Secondary | ICD-10-CM | POA: Diagnosis not present

## 2020-07-31 DIAGNOSIS — R05 Cough: Secondary | ICD-10-CM | POA: Insufficient documentation

## 2020-07-31 DIAGNOSIS — J189 Pneumonia, unspecified organism: Secondary | ICD-10-CM | POA: Diagnosis not present

## 2020-07-31 DIAGNOSIS — D649 Anemia, unspecified: Secondary | ICD-10-CM | POA: Diagnosis not present

## 2020-07-31 DIAGNOSIS — R0602 Shortness of breath: Secondary | ICD-10-CM | POA: Diagnosis not present

## 2020-07-31 DIAGNOSIS — I10 Essential (primary) hypertension: Secondary | ICD-10-CM | POA: Insufficient documentation

## 2020-07-31 DIAGNOSIS — N400 Enlarged prostate without lower urinary tract symptoms: Secondary | ICD-10-CM | POA: Insufficient documentation

## 2020-07-31 MED ORDER — LENALIDOMIDE 10 MG PO CAPS: 10.0000 mg | ORAL_CAPSULE | Freq: Every day | ORAL | 6 refills | Status: DC

## 2020-07-31 NOTE — Progress Notes (Signed)
[place]  [phone] [phone]     ID: Joe [name] DOB: October 11, 1929  MR#: [id number]  AUQ#:[id number]  Patient Care Team: Jani Gravel, MD as PCP - General (Internal Medicine) Jenica Costilow, Virgie Dad, MD as Consulting Physician (Oncology) Dr. Iantha Fallen, D.D.S., PA Chauncey Cruel, MD OTHER MD:  CHIEF COMPLAINT: multiple myeloma  CURRENT TREATMENT: denosumab Xgeva; to start lenalidomide   INTERVAL HISTORY: Joe [name] returns today for follow up and treatment of his multiple myeloma.   He has been receiving bortezomib.  He tolerates this without any side effects that he is aware of.  At night he feels his feet may be a little bit numb or just cold.  This is not a new problem and it does not seem to be any worse than before.  He also has been receiving dexamethasone weekly.  He has had no side effects from this that he is aware of  Last dose of bortezomib/dexamethasone was July 24, 2020.  We are following chiefly his M spike M Protein SerPl Elph-Mcnc Not Observed g/dL 0.7High VC  0.9High VC  1.1High VC  3.8High VC    IgG (Immunoglobin G), Serum 603 - 1,613 mg/dL 1,176  1,261  1,614High  5,947High     Finally he receives denosumab/Xgeva every 12 weeks, with this most recent dose 07/24/2020.  REVIEW OF SYSTEMS: Joe [name] developed a cough yesterday.  He has a little bit of shortness of breath.  The cough is nonproductive.  He has no fever.  He has not lost his sense of smell.  He received his Covid vaccine x2, most recently in January.  Aside from these symptoms a detailed review of systems today was stable   HISTORY OF CURRENT ILLNESS: From the original intake note:  Joe [name] is an 86 y/o [place] man formerly followed by my partner Dr Julien Nordmann for M-GUS. We have an M-Spike of 0.87 documented 07/12/2012. [name] kappa/lambda light chains and total IgG were follower yearly until 01/2016, when they were 2.36 and 1517 respectively  (unremarkable). He was lost to follow-up after that point. Yesterday [02/29/2020] the patient presented to Urgent Care with poorly controlled pain. This was felt to be mussculoskeletal and he was started on a medrol dose-pak and robaxin. However his pain worsened and he presented to the ED at [facility] where vitals and exam were unremarkable but labs showed a creatinine of 1.84 (baseline 1.0), calcium 12.7 with albumin 2.8 and total protein 10.4 (globulin fraction 7.8). CT of the abdomen obtained for evaluation of his abdominal and back pain showed no hydronephrosis but multiple lytic lesons. We were consulted for further evaluation and treatment of likely multiple myeloma and he was admitted to the hospital.   The patient's subsequent history is as detailed below.   PAST MEDICAL HISTORY: Past Medical History:  Diagnosis Date  . Enlarged prostate   . Hypertension     PAST SURGICAL HISTORY: Past Surgical History:  Procedure Laterality Date  . EYE SURGERY    . PROSTATE SURGERY      FAMILY HISTORY Family History  Problem Relation Age of Onset  . Hypertension Mother     SOCIAL HISTORY:  Retired, used to work for the [employer] Lives with wife [place]. Has 4 children, 2 in [place], one in [place], one in [place]; 5 grandchildren and 5 great-grandchildren. Attands a [employer].    ADVANCED DIRECTIVES: at the initial visit the patient confirmed his wife is his HCPOA (despite her  memory issues).   HEALTH MAINTENANCE: Social History   Tobacco Use  . Smoking status: Never Smoker  . Smokeless tobacco: Never Used  Vaping Use  . Vaping Use: Never used  Substance Use Topics  . Alcohol use: Never  . Drug use: Never     Colonoscopy: 6-8 years ago     No Known Allergies  Current Outpatient Medications  Medication Sig Dispense Refill  . acetaminophen (TYLENOL) 500 MG tablet Take 1,000 mg by mouth every 6 (six) hours as needed for mild  pain.    [name] acyclovir (ZOVIRAX) 400 MG tablet Take 1 tablet by mouth twice daily 60 tablet 0  . amLODipine (NORVASC) 5 MG tablet Take 5 mg by mouth daily.    . calcium carbonate (TUMS - DOSED IN MG ELEMENTAL CALCIUM) 500 MG chewable tablet Chew 3 tablets by mouth daily.    [name] doxazosin (CARDURA) 4 MG tablet Take 4 mg by mouth at bedtime.    . famotidine (PEPCID) 20 MG tablet Take 1 tablet by mouth once daily 30 tablet 0  . finasteride (PROSCAR) 5 MG tablet Take 1 tablet by mouth once daily 30 tablet 0  . indapamide (LOZOL) 2.5 MG tablet Take 2.5 mg by mouth daily.     [name] lenalidomide (REVLIMID) 10 MG capsule Take 1 capsule (10 mg total) by mouth daily. [employer] Auth # [id number]     Date Obtained 07/31/2020 21 capsule 6  . magnesium citrate SOLN Take 1/2 bottle if no bowel movement in 2 hours repeat (Patient not taking: Reported on 04/24/2020) 195 mL 1  . polyethylene glycol powder (MIRALAX) 17 GM/SCOOP powder Take 255 g by mouth daily. 255 g 0  . pravastatin (PRAVACHOL) 40 MG tablet Take 40 mg by mouth daily.    . prochlorperazine (COMPAZINE) 10 MG tablet Take 1 tablet (10 mg total) by mouth every 6 (six) hours as needed (Nausea or vomiting). 30 tablet 1  . senna-docusate (SENOKOT-S) 8.6-50 MG tablet Take 2 tablets by mouth 2 (two) times daily. (Patient taking differently: Take 2 tablets by mouth 2 (two) times daily as needed. ) 120 tablet 0  . tamsulosin (FLOMAX) 0.4 MG CAPS capsule Take 1 capsule (0.4 mg total) by mouth daily after supper. 30 capsule 6  . traMADol (ULTRAM) 50 MG tablet Take 1 tablet (50 mg total) by mouth every 8 (eight) hours as needed for severe pain. 60 tablet 0  . vitamin B-12 (CYANOCOBALAMIN) 1000 MCG tablet Take 1,000 mcg by mouth daily.     No current facility-administered medications for this visit.    OBJECTIVE: African-American man who appears stated age  86:   07/31/20 1255  BP: 138/67  Pulse: 73  Resp: 18  Temp: (!) 97.5 F (36.4 C)  SpO2: 100%     Body mass  index is 27.39 kg/m.   Wt Readings from Last 3 Encounters:  07/31/20 196 lb 6.4 oz (89.1 kg)  07/17/20 195 lb 2 oz (88.5 kg)  07/10/20 198 lb 3 oz (89.9 kg)      ECOG FS:1 - Symptomatic but completely ambulatory  Sclerae unicteric, EOMs intact Wearing a mask No cervical or supraclavicular adenopathy Lungs no rales or rhonchi Heart regular rate and rhythm Abd soft, nontender, positive bowel sounds MSK no focal spinal tenderness, no upper extremity lymphedema Neuro: nonfocal, well oriented, appropriate affect  LAB RESULTS:  CMP     Component Value Date/Time   NA 138 07/17/2020 1334   NA 140 01/09/2016 1045   K  3.7 07/17/2020 1334   K 3.8 01/09/2016 1045   CL 102 07/17/2020 1334   CO2 30 07/17/2020 1334   CO2 27 01/09/2016 1045   GLUCOSE 85 07/17/2020 1334   GLUCOSE 89 01/09/2016 1045   BUN 17 07/17/2020 1334   BUN 14.7 01/09/2016 1045   CREATININE 0.97 07/17/2020 1334   CREATININE 1.0 01/09/2016 1045   CALCIUM 9.5 07/17/2020 1334   CALCIUM 9.2 01/09/2016 1045   PROT 6.4 (L) 07/17/2020 1334   PROT 7.3 01/09/2016 1045   ALBUMIN 3.5 07/17/2020 1334   ALBUMIN 3.7 01/09/2016 1045   AST 15 07/17/2020 1334   AST 18 01/09/2016 1045   ALT 10 07/17/2020 1334   ALT 15 01/09/2016 1045   ALKPHOS 75 07/17/2020 1334   ALKPHOS 60 01/09/2016 1045   BILITOT 0.8 07/17/2020 1334   BILITOT 0.51 01/09/2016 1045   GFRNONAA >60 07/17/2020 1334   GFRAA >60 07/17/2020 1334    Lab Results  Component Value Date   TOTALPROTELP 6.1 07/03/2020   ALBUMINELP 56.1 07/12/2012   A1GS 3.5 07/12/2012   A2GS 9.8 07/12/2012   BETS 5.1 07/12/2012   BETA2SER 3.6 07/12/2012   GAMS 21.9 (H) 07/12/2012   MSPIKE 0.87 07/12/2012   SPEI * 07/12/2012     Lab Results  Component Value Date   KPAFRELGTCHN 31.2 (H) 07/03/2020   LAMBDASER 3.4 (L) 07/03/2020   KAPLAMBRATIO 9.18 (H) 07/03/2020    Lab Results  Component Value Date   WBC 7.2 07/24/2020   NEUTROABS 5.3 07/24/2020   HGB 10.0 (L)  07/24/2020   HCT 31.2 (L) 07/24/2020   MCV 82.3 07/24/2020   PLT 133 (L) 07/24/2020      Chemistry      Component Value Date/Time   NA 138 07/17/2020 1334   NA 140 01/09/2016 1045   K 3.7 07/17/2020 1334   K 3.8 01/09/2016 1045   CL 102 07/17/2020 1334   CO2 30 07/17/2020 1334   CO2 27 01/09/2016 1045   BUN 17 07/17/2020 1334   BUN 14.7 01/09/2016 1045   CREATININE 0.97 07/17/2020 1334   CREATININE 1.0 01/09/2016 1045      Component Value Date/Time   CALCIUM 9.5 07/17/2020 1334   CALCIUM 9.2 01/09/2016 1045   ALKPHOS 75 07/17/2020 1334   ALKPHOS 60 01/09/2016 1045   AST 15 07/17/2020 1334   AST 18 01/09/2016 1045   ALT 10 07/17/2020 1334   ALT 15 01/09/2016 1045   BILITOT 0.8 07/17/2020 1334   BILITOT 0.51 01/09/2016 1045       No results found for: LABCA2  No components found for: QMGQQP619  No results for input(s): INR in the last 168 hours.  No results found for: LABCA2  No results found for: JKD326  No results found for: ZTI458  No results found for: KDX833  No results found for: CA2729  No components found for: HGQUANT  No results found for: CEA1 / No results found for: CEA1   No results found for: AFPTUMOR  No results found for: CHROMOGRNA  No results found for: HGBA, HGBA2QUANT, HGBFQUANT, HGBSQUAN (Hemoglobinopathy evaluation)   Lab Results  Component Value Date   LDH 136 01/09/2016    Lab Results  Component Value Date   IRON 49 03/01/2020   TIBC 238 (L) 03/01/2020   IRONPCTSAT 21 03/01/2020   (Iron and TIBC)  Lab Results  Component Value Date   FERRITIN 375 (H) 03/01/2020    Urinalysis    Component Value Date/Time  COLORURINE AMBER (A) 03/01/2020 1052   APPEARANCEUR CLOUDY (A) 03/01/2020 1052   LABSPEC 1.021 03/01/2020 1052   PHURINE 5.0 03/01/2020 1052   GLUCOSEU NEGATIVE 03/01/2020 1052   HGBUR NEGATIVE 03/01/2020 1052   BILIRUBINUR NEGATIVE 03/01/2020 1052   KETONESUR NEGATIVE 03/01/2020 1052   PROTEINUR 100 (A)  03/01/2020 1052   UROBILINOGEN 0.2 02/29/2020 1537   NITRITE NEGATIVE 03/01/2020 1052   LEUKOCYTESUR NEGATIVE 03/01/2020 1052    STUDIES: No results found.   ELIGIBLE FOR AVAILABLE RESEARCH PROTOCOL:   ASSESSMENT: 86 y.o.  85 [place] man with a history of M-GUS, presenting 02/29/2020 with a high globulin fraction, worsening renal function, hypercalcemia, anemia and multiple lytic bone lesions.   (1) IgG Kappa Multiple Myeloma: Labs diagnostic on 03/01/2020 with Mspike of 3.8, Kappa free light chain 690.6, and ratio 117.05.  (a) CT A/P on 03/01/2020 shows "innumerable" lytic lesions involving all visualized bones  (b) bone marrow biopsy 03/11/2020 shows 20% plasmacytosis by CD138, 12% by manual differential; molecular studies not requested  (c) Bortezomib and Dexamethasone given weekly 3 weeks on and 1 week off beginning 03/11/2020    (2) lytic bone lesions/ hypercalcemia:  (a) Pamidronate administered on 03/01/2020-- resolved  (b) denosumab/Xgeva to start 04/17/2020-- repeat every 12 weeks   (a) take TUMS 3 tabs on day of Xgeva dose   PLAN: Joe [name] has had an excellent response to his bortezomib/dexamethasone treatments.  His M spike was down to 0.6 last time we checked.  He tolerated treatment remarkably well.  At this point we are going to try to switch him over to lenalidomide.  He will be on 10 mg daily 21 days on 7 days off.  I have entered the order and we have sent a request to [facility].  I expect the patient will be receiving his first dose in about a week.  I have instructed Joe [name] to take start his lenalidomide whenever he receives the package.  He will take 1 tablet daily at bedtime.  He is going to see [place] again September 17 and we will check lab work that day.  We will review how he is taking the medicine at that point.  I think it is very unlikely that Joe [name] is having COVID-19.  He was vaccinated.  He has 100% saturation today on room air.  Nevertheless I am  obtaining a chest x-ray and if there are any suspicious findings there he will be directed to the emergency room for testing as I was not able to get him an appointment for testing before Saturday, 4 days from now  He knows to call us if any other symptoms develop or if his current symptoms worsen  Total encounter time 30 minutes.    Virgie Dad. Luby Seamans, MD 07/31/20 1:26 PM Medical Oncology and Hematology [facility] [place], [place] [address] Tel. [phone]    Fax. [phone]   I, Wilburn Mylar, am acting as scribe for Dr. Virgie Dad. Delois Tolbert.  I, Lurline Del MD, have reviewed the above documentation for accuracy and completeness, and I agree with the above.   *Total Encounter Time as defined by the Centers for Medicare and Medicaid Services includes, in addition to the face-to-face time of a patient visit (documented in the note above) non-face-to-face time: obtaining and reviewing outside history, ordering and reviewing medications, tests or procedures, care coordination (communications with other health care professionals or caregivers) and documentation in the medical record.

## 2020-07-31 NOTE — Telephone Encounter (Signed)
Scheduled appts per 9/1 los. Gave pt a print out of AVS.  

## 2020-07-31 NOTE — Progress Notes (Signed)
Oral Oncology Pharmacist Encounter  Prescription for Revlimid (lenalidomide) sent to [facility] in error. This is a limited distribution medication and is required to be filled at [facility]. Prescription redirected to their dispensing pharmacy.  Leron Croak, PharmD, BCPS Hematology/Oncology Clinical Pharmacist [facility] [phone] 07/31/2020 12:04 PM

## 2020-07-31 NOTE — Telephone Encounter (Signed)
Oral Oncology Pharmacist Encounter  Received new prescription for Revlimid (lenalidomide) for the maintenance treatment of multiple myeloma, planned duration until disease progression or unacceptable drug toxicity.  Prescription dose and frequency assessed for appropriateness. Appropriate for therapy initiation.   CBC w/ Diff from 07/24/20 and CMP from 07/17/20 assessed, noted platelet count of 133 K/uL - all other labs stable for treatment initiation. Patient has follow-up labs scheduled on 08/16/20 after treatment initiation.   Current medication list in Epic reviewed, no significant/relevant DDIs with Revlimid identified.  Evaluated chart and no patient barriers to medication adherence noted.   Prescription has been e-scribed to [facility] by [employer] since Revlimid is a limited distrubution medication.   Oral Oncology Clinic will continue to follow for insurance authorization, copayment issues, initial counseling and start date.  Leron Croak, PharmD, BCPS Hematology/Oncology Clinical Pharmacist [facility] [phone] 07/31/2020 1:48 PM

## 2020-07-31 NOTE — Telephone Encounter (Signed)
Oral Oncology Patient Advocate Encounter  Received notification from [place] [employer] [place] that prior authorization for Revlimid is required.  PA submitted on CoverMyMeds Key [id number] Status is pending  Oral Oncology Clinic will continue to follow.  [place] Patient [place] Phone [phone] Fax [phone] 07/31/2020 12:13 PM

## 2020-08-01 NOTE — Telephone Encounter (Signed)
Oral Oncology Patient Advocate Encounter  Prior Authorization for Revlimid has been approved.    PA# [id number] Effective dates: 07/31/20 through 07/31/21  Prescription has been sent to [facility].  Oral Oncology Clinic will continue to follow.   [place] Patient [place] Phone [phone] Fax [phone] 08/01/2020 8:46 AM

## 2020-08-02 DIAGNOSIS — M25561 Pain in right knee: Secondary | ICD-10-CM | POA: Diagnosis not present

## 2020-08-07 NOTE — Progress Notes (Signed)
Pt notified of CXR results.  Verbalized understanding.  Pt reports feeling better since LOV.  Denies any changes with fever or shortness of breath.  No further needs at this time.

## 2020-08-08 ENCOUNTER — Telehealth: Payer: Self-pay | Admitting: *Deleted

## 2020-08-08 NOTE — Telephone Encounter (Signed)
This RN was notified by [facility] that the [place] authorization number put on pt's prescription is not correct.  Authorization is for another patient.  Need for correction and notification to [facility] of current authorization number.  This RN reviewed above at the [place] web site - and noted above as correct- auth number obtained for this pt's prescription was registered under a sound alike name.  This RN obtained a new authorization number for this patient and contacted a [employer] representative and discontinued prior authorization.  This RN then called to [facility] and gave correct authorization number per Anderson Malta at [phone]

## 2020-08-09 ENCOUNTER — Other Ambulatory Visit: Payer: Self-pay | Admitting: Oncology

## 2020-08-09 DIAGNOSIS — M1711 Unilateral primary osteoarthritis, right knee: Secondary | ICD-10-CM | POA: Diagnosis not present

## 2020-08-09 DIAGNOSIS — C9000 Multiple myeloma not having achieved remission: Secondary | ICD-10-CM

## 2020-08-09 DIAGNOSIS — M1712 Unilateral primary osteoarthritis, left knee: Secondary | ICD-10-CM | POA: Diagnosis not present

## 2020-08-09 DIAGNOSIS — M25561 Pain in right knee: Secondary | ICD-10-CM | POA: Diagnosis not present

## 2020-08-09 DIAGNOSIS — M170 Bilateral primary osteoarthritis of knee: Secondary | ICD-10-CM | POA: Diagnosis not present

## 2020-08-09 NOTE — Telephone Encounter (Addendum)
Oral Chemotherapy Pharmacist Encounter Patient stated he spoke with [facility] representative 08/08/20 and they obtained his financial information to apply for foundation funding to help cover the cost of Revlimid since his medication copay was unaffordable. Spoke with [facility] pharmacist and was informed that Revlimid is shipping out 08/09/20 for delivery on 08/12/20.  I spoke with patient for overview of: Revlimid for the maintenance treatment of multiple myeloma, planned duration until disease progression or unacceptable drug toxicity.   Counseled patient on administration, dosing, side effects, monitoring, drug-food interactions, safe handling, storage, and disposal.  Patient will take Revlimid 109m capsules, 1 capsule by mouth once daily, without regard to food, with a full glass of water.  Revlimid will be given 21 days on, 7 days off, repeat every 28 days.  Revlimid start date: 08/12/20  Adverse effects of Revlimid include but are not limited to: nausea, constipation, diarrhea, abdominal pain, rash, fatigue, drug fever, and decreased blood counts.    Reviewed with patient importance of keeping a medication schedule and plan for any missed doses. No barriers to medication adherence identified.  Medication reconciliation performed and medication/allergy list updated.  Patient confirmed he continues to take acyclovir Patient counseled on importance of daily aspirin 870mfor VTE prophylaxis.  Insurance authorization for Revlimid has been obtained.  Revlimid prescription is being dispensed from [facility] it is a limited distribution medication.  All questions answered.  Mr. FiGoshornoiced understanding and appreciation.   Patient knows to call the office with questions or concerns.  ReLeron CroakPharmD, BCPS Hematology/Oncology Clinical Pharmacist [phone]/08/2020 11:39 AM

## 2020-08-13 ENCOUNTER — Telehealth: Payer: Self-pay | Admitting: *Deleted

## 2020-08-13 NOTE — Telephone Encounter (Signed)
VM left by pt stating he is unable to come on 08/16/2020 - second message left requesting a return call.  This RN returned call and obtained no answer and message stating VM not set up.  This RN sent a LOS order to reschedule appointment per above.

## 2020-08-14 ENCOUNTER — Telehealth: Payer: Self-pay | Admitting: *Deleted

## 2020-08-14 NOTE — Telephone Encounter (Signed)
This RN spoke with pt per his call stating need to reschedule appointment on 9/17 ( los sent per prior call ).  Joe [name] has been rescheduled to 10/1 for lab and visit.  Of note pt received his lenalidomide this Monday and has started on it with no issues at this time.  Lab and visit would be on day 19 of 1st cycle.  Joe [name] verbalized understanding to call if he has any concerns.

## 2020-08-16 ENCOUNTER — Ambulatory Visit: Payer: Medicare Other | Admitting: Adult Health

## 2020-08-16 ENCOUNTER — Other Ambulatory Visit: Payer: Medicare Other

## 2020-08-22 DIAGNOSIS — R739 Hyperglycemia, unspecified: Secondary | ICD-10-CM | POA: Diagnosis not present

## 2020-08-22 DIAGNOSIS — I10 Essential (primary) hypertension: Secondary | ICD-10-CM | POA: Diagnosis not present

## 2020-08-22 DIAGNOSIS — N400 Enlarged prostate without lower urinary tract symptoms: Secondary | ICD-10-CM | POA: Diagnosis not present

## 2020-08-22 DIAGNOSIS — E785 Hyperlipidemia, unspecified: Secondary | ICD-10-CM | POA: Diagnosis not present

## 2020-08-29 ENCOUNTER — Other Ambulatory Visit: Payer: Self-pay | Admitting: Oncology

## 2020-08-29 DIAGNOSIS — K5900 Constipation, unspecified: Secondary | ICD-10-CM | POA: Diagnosis not present

## 2020-08-29 DIAGNOSIS — D472 Monoclonal gammopathy: Secondary | ICD-10-CM

## 2020-08-29 DIAGNOSIS — R739 Hyperglycemia, unspecified: Secondary | ICD-10-CM | POA: Diagnosis not present

## 2020-08-29 DIAGNOSIS — N400 Enlarged prostate without lower urinary tract symptoms: Secondary | ICD-10-CM | POA: Diagnosis not present

## 2020-08-29 DIAGNOSIS — R42 Dizziness and giddiness: Secondary | ICD-10-CM | POA: Diagnosis not present

## 2020-08-29 DIAGNOSIS — C61 Malignant neoplasm of prostate: Secondary | ICD-10-CM | POA: Diagnosis not present

## 2020-08-29 DIAGNOSIS — D509 Iron deficiency anemia, unspecified: Secondary | ICD-10-CM | POA: Diagnosis not present

## 2020-08-29 DIAGNOSIS — E785 Hyperlipidemia, unspecified: Secondary | ICD-10-CM | POA: Diagnosis not present

## 2020-08-29 DIAGNOSIS — Z23 Encounter for immunization: Secondary | ICD-10-CM | POA: Diagnosis not present

## 2020-08-29 DIAGNOSIS — M1711 Unilateral primary osteoarthritis, right knee: Secondary | ICD-10-CM | POA: Diagnosis not present

## 2020-08-29 DIAGNOSIS — M1712 Unilateral primary osteoarthritis, left knee: Secondary | ICD-10-CM | POA: Diagnosis not present

## 2020-08-29 DIAGNOSIS — I10 Essential (primary) hypertension: Secondary | ICD-10-CM | POA: Diagnosis not present

## 2020-08-30 ENCOUNTER — Encounter: Payer: Self-pay | Admitting: Adult Health

## 2020-08-30 ENCOUNTER — Inpatient Hospital Stay: Payer: Medicare Other | Attending: Adult Health

## 2020-08-30 ENCOUNTER — Inpatient Hospital Stay (HOSPITAL_BASED_OUTPATIENT_CLINIC_OR_DEPARTMENT_OTHER): Payer: Medicare Other | Admitting: Adult Health

## 2020-08-30 ENCOUNTER — Other Ambulatory Visit: Payer: Self-pay

## 2020-08-30 VITALS — BP 144/68 | HR 78 | Temp 97.4°F | Resp 18 | Ht 71.0 in | Wt 190.3 lb

## 2020-08-30 DIAGNOSIS — M899 Disorder of bone, unspecified: Secondary | ICD-10-CM | POA: Diagnosis not present

## 2020-08-30 DIAGNOSIS — C9000 Multiple myeloma not having achieved remission: Secondary | ICD-10-CM

## 2020-08-30 DIAGNOSIS — N179 Acute kidney failure, unspecified: Secondary | ICD-10-CM

## 2020-08-30 DIAGNOSIS — Z7189 Other specified counseling: Secondary | ICD-10-CM

## 2020-08-30 DIAGNOSIS — D649 Anemia, unspecified: Secondary | ICD-10-CM

## 2020-08-30 LAB — CMP (CANCER CENTER ONLY)
ALT: 17 U/L (ref 0–44)
AST: 18 U/L (ref 15–41)
Albumin: 3.3 g/dL — ABNORMAL LOW (ref 3.5–5.0)
Alkaline Phosphatase: 79 U/L (ref 38–126)
Anion gap: 7 (ref 5–15)
BUN: 14 mg/dL (ref 8–23)
CO2: 32 mmol/L (ref 22–32)
Calcium: 8.9 mg/dL (ref 8.9–10.3)
Chloride: 99 mmol/L (ref 98–111)
Creatinine: 1.07 mg/dL (ref 0.61–1.24)
GFR, Est AFR Am: >60 mL/min (ref >60)
GFR, Estimated: >60 mL/min (ref >60)
Glucose, Bld: 102 mg/dL — ABNORMAL HIGH (ref 70–99)
Potassium: 2.7 mmol/L — CL (ref 3.5–5.1)
Sodium: 138 mmol/L (ref 135–145)
Total Bilirubin: 0.7 mg/dL (ref 0.3–1.2)
Total Protein: 7 g/dL (ref 6.5–8.1)

## 2020-08-30 LAB — CBC WITH DIFFERENTIAL (CANCER CENTER ONLY)
Abs Immature Granulocytes: 0.03 10*3/uL (ref 0.00–0.07)
Basophils Absolute: 0 10*3/uL (ref 0.0–0.1)
Basophils Relative: 1 %
Eosinophils Absolute: 0.3 10*3/uL (ref 0.0–0.5)
Eosinophils Relative: 4 %
HCT: 34 % — ABNORMAL LOW (ref 39.0–52.0)
Hemoglobin: 10.9 g/dL — ABNORMAL LOW (ref 13.0–17.0)
Immature Granulocytes: 1 %
Lymphocytes Relative: 14 %
Lymphs Abs: 0.9 10*3/uL (ref 0.7–4.0)
MCH: 25.8 pg — ABNORMAL LOW (ref 26.0–34.0)
MCHC: 32.1 g/dL (ref 30.0–36.0)
MCV: 80.4 fL (ref 80.0–100.0)
Monocytes Absolute: 0.9 10*3/uL (ref 0.1–1.0)
Monocytes Relative: 14 %
Neutro Abs: 4.3 10*3/uL (ref 1.7–7.7)
Neutrophils Relative %: 66 %
Platelet Count: 220 10*3/uL (ref 150–400)
RBC: 4.23 MIL/uL (ref 4.22–5.81)
RDW: 12.4 % (ref 11.5–15.5)
WBC Count: 6.4 10*3/uL (ref 4.0–10.5)
nRBC: 0 % (ref 0.0–0.2)

## 2020-08-30 MED ORDER — POTASSIUM CHLORIDE ER 10 MEQ PO TBCR: 20.0000 meq | EXTENDED_RELEASE_TABLET | Freq: Two times a day (BID) | ORAL | 0 refills | Status: DC

## 2020-08-30 NOTE — Progress Notes (Signed)
Joe [name]  [phone] [phone]     ID: Joe [name] DOB: February 24, 1929  MR#: [id number]  CHY#:[id number]  Patient Care Team: Jani Gravel, MD as PCP - General (Internal Medicine) Magrinat, Virgie Dad, MD as Consulting Physician (Oncology) Dr. Iantha Fallen, D.D.S., PA Scot Dock, NP OTHER MD:  CHIEF COMPLAINT: multiple myeloma  CURRENT TREATMENT: denosumab Xgeva; to start lenalidomide   INTERVAL HISTORY: Joe [name] returns today for follow up and treatment of his multiple myeloma.    He had been receiving bortezomib/dexamethasone and his last dose was given on July 24, 2020.  He was transitioned to Lenalidomide and he takes 33m daily 21 days on and 7 day off.  He has three days left of this therapy.  We are following chiefly his M spike Results for FHILL, MACKIE(MRN [id number] as of 08/30/2020 08:55  Ref. Range 03/01/2020 18:45 04/17/2020 13:49 05/15/2020 13:29 06/12/2020 13:25 07/03/2020 09:54  M Protein SerPl Elph-Mcnc Latest Ref Range: Not Observed g/dL 3.8 (H) 1.1 (H) 0.9 (H) 0.7 (H) 0.6 (H)    Finally he receives denosumab/Xgeva every 12 weeks, with this most recent dose 07/24/2020.  REVIEW OF SYSTEMS: LEyadis feeling quite well.  He has no issue such as urinary frequency, fever, chills, chest pain, palpitations, cough, headaches, vision issues, or any other concerns.  He has a low potassium today, and he has no diarrhea or frequent urination. He is not on a diuretic.  He denies feeling off, and thinks maybe he has been avoiding his usual intake of potassium recently.  A detailed ROS was otherwise non contributory.     HISTORY OF CURRENT ILLNESS: From the original intake note:  Joe [name] an 86y/o GGuyanaman formerly followed by my partner Dr MJulien Nordmannfor M-GUS. We have an M-Spike of 0.87 documented 07/12/2012. [name] kappa/lambda light chains and total IgG were follower yearly until 01/2016, when they were 2.36 and 1517  respectively (unremarkable). He was lost to follow-up after that point. Yesterday [02/29/2020] the patient presented to Urgent Care with poorly controlled pain. This was felt to be mussculoskeletal and he was started on a medrol dose-pak and robaxin. However his pain worsened [facility] but labs showed a creatinine of 1.84 (baseline 1.0), calcium 12.7 with albumin 2.8 and total protein 10.4 (globulin fraction 7.8). CT of the abdomen obtained for evaluation of his abdominal and back pain showed no hydronephrosis but multiple lytic lesons. We were consulted for further evaluation and treatment of likely multiple myeloma and he was admitted to the hospital.   The patient's subsequent history is as detailed below.   PAST MEDICAL HISTORY: Past Medical History:  Diagnosis Date  . Enlarged prostate   . Hypertension     PAST SURGICAL HISTORY: Past Surgical History:  Procedure Laterality Date  . EYE SURGERY    . PROSTATE SURGERY      FAMILY HISTORY Family History  Problem Relation Age of Onset  . Hypertension Mother     SOCIAL HISTORY:  Retired, used to work for the [employer] [place] 4 [place], [place] 5 grandchildren and 5 great-grandchildren. Attands a l[employer]    ADVANCED DIRECTIVES: at the initial visit the patient confirmed his wife is his HCPOA (despite her memory issues).   HEALTH MAINTENANCE: Social History   Tobacco Use  . Smoking  status: Never Smoker  . Smokeless tobacco: Never Used  Vaping Use  . Vaping Use: Never used  Substance Use Topics  . Alcohol use: Never  . Drug use: Never     Colonoscopy: 6-8 years ago     No Known Allergies  Current Outpatient Medications  Medication Sig Dispense Refill  . acetaminophen (TYLENOL) 500 MG tablet Take 1,000 mg by mouth every 6 (six) hours as  needed for mild pain.    [name] acyclovir (ZOVIRAX) 400 MG tablet Take 1 tablet by mouth twice daily 60 tablet 0  . amLODipine (NORVASC) 5 MG tablet Take 5 mg by mouth daily.    [name] aspirin EC 81 MG tablet Take 81 mg by mouth daily. Swallow whole.    . calcium carbonate (TUMS - DOSED IN MG ELEMENTAL CALCIUM) 500 MG chewable tablet Chew 3 tablets by mouth daily.    [name] doxazosin (CARDURA) 4 MG tablet Take 4 mg by mouth at bedtime.    . famotidine (PEPCID) 20 MG tablet Take 1 tablet by mouth once daily 30 tablet 0  . finasteride (PROSCAR) 5 MG tablet Take 1 tablet by mouth once daily 30 tablet 0  . indapamide (LOZOL) 2.5 MG tablet Take 2.5 mg by mouth daily.     [name] lenalidomide (REVLIMID) 10 MG capsule Take 1 capsule (10 mg total) by mouth daily. [employer] Auth # [id number]     Date Obtained 07/31/2020 21 capsule 6  . magnesium citrate SOLN Take 1/2 bottle if no bowel movement in 2 hours repeat (Patient not taking: Reported on 04/24/2020) 195 mL 1  . polyethylene glycol powder (MIRALAX) 17 GM/SCOOP powder Take 255 g by mouth daily. 255 g 0  . potassium chloride (KLOR-CON) 10 MEQ tablet Take 2 tablets (20 mEq total) by mouth 2 (two) times daily. 20 tablet 0  . pravastatin (PRAVACHOL) 40 MG tablet Take 40 mg by mouth daily.    . prochlorperazine (COMPAZINE) 10 MG tablet Take 1 tablet (10 mg total) by mouth every 6 (six) hours as needed (Nausea or vomiting). 30 tablet 1  . senna-docusate (SENOKOT-S) 8.6-50 MG tablet Take 2 tablets by mouth 2 (two) times daily. (Patient taking differently: Take 2 tablets by mouth 2 (two) times daily as needed. ) 120 tablet 0  . tamsulosin (FLOMAX) 0.4 MG CAPS capsule Take 1 capsule (0.4 mg total) by mouth daily after supper. 30 capsule 6  . traMADol (ULTRAM) 50 MG tablet Take 1 tablet (50 mg total) by mouth every 8 (eight) hours as needed for severe pain. 60 tablet 0  . vitamin B-12 (CYANOCOBALAMIN) 1000 MCG tablet Take 1,000 mcg by mouth daily.     No current facility-administered  medications for this visit.    OBJECTIVE: Vitals:   08/30/20 1513  BP: (!) 144/68  Pulse: 78  Resp: 18  Temp: (!) 97.4 F (36.3 C)  SpO2: 98%     Body mass index is 26.54 kg/m.   Wt Readings from Last 3 Encounters:  08/30/20 190 lb 4.8 oz (86.3 kg)  07/31/20 196 lb 6.4 oz (89.1 kg)  07/17/20 195 lb 2 oz (88.5 kg)  ECOG FS:1 - Symptomatic but completely ambulatory GENERAL: Patient is a well appearing older male in no acute distress HEENT:  Sclerae anicteric. Mask in place.. Neck is supple.  NODES:  No cervical, supraclavicular, or axillary lymphadenopathy palpated.  LUNGS:  Clear to auscultation bilaterally.  No wheezes or rhonchi. HEART:  Regular rate and rhythm. No murmur appreciated. ABDOMEN:  Soft,  nontender.  Positive, normoactive bowel sounds. No organomegaly palpated. MSK:  No focal spinal tenderness to palpation. Full range of motion bilaterally in the upper extremities. EXTREMITIES:  No peripheral edema.   SKIN:  Clear with no obvious rashes or skin changes. No nail dyscrasia. NEURO:  Nonfocal. Well oriented.  Appropriate affect.    LAB RESULTS:  CMP     Component Value Date/Time   NA 138 08/30/2020 1402   NA 140 01/09/2016 1045   K 2.7 (LL) 08/30/2020 1402   K 3.8 01/09/2016 1045   CL 99 08/30/2020 1402   CO2 32 08/30/2020 1402   CO2 27 01/09/2016 1045   GLUCOSE 102 (H) 08/30/2020 1402   GLUCOSE 89 01/09/2016 1045   BUN 14 08/30/2020 1402   BUN 14.7 01/09/2016 1045   CREATININE 1.07 08/30/2020 1402   CREATININE 1.0 01/09/2016 1045   CALCIUM 8.9 08/30/2020 1402   CALCIUM 9.2 01/09/2016 1045   PROT 7.0 08/30/2020 1402   PROT 7.3 01/09/2016 1045   ALBUMIN 3.3 (L) 08/30/2020 1402   ALBUMIN 3.7 01/09/2016 1045   AST 18 08/30/2020 1402   AST 18 01/09/2016 1045   ALT 17 08/30/2020 1402   ALT 15 01/09/2016 1045   ALKPHOS 79 08/30/2020 1402   ALKPHOS 60 01/09/2016 1045   BILITOT 0.7 08/30/2020 1402   BILITOT 0.51 01/09/2016 1045   GFRNONAA >60  08/30/2020 1402   GFRAA >60 08/30/2020 1402    Lab Results  Component Value Date   TOTALPROTELP 6.1 07/03/2020   ALBUMINELP 56.1 07/12/2012   A1GS 3.5 07/12/2012   A2GS 9.8 07/12/2012   BETS 5.1 07/12/2012   BETA2SER 3.6 07/12/2012   GAMS 21.9 (H) 07/12/2012   MSPIKE 0.87 07/12/2012   SPEI * 07/12/2012     Lab Results  Component Value Date   KPAFRELGTCHN 31.2 (H) 07/03/2020   LAMBDASER 3.4 (L) 07/03/2020   KAPLAMBRATIO 9.18 (H) 07/03/2020    Lab Results  Component Value Date   WBC 6.4 08/30/2020   NEUTROABS 4.3 08/30/2020   HGB 10.9 (L) 08/30/2020   HCT 34.0 (L) 08/30/2020   MCV 80.4 08/30/2020   PLT 220 08/30/2020      Chemistry      Component Value Date/Time   NA 138 08/30/2020 1402   NA 140 01/09/2016 1045   K 2.7 (LL) 08/30/2020 1402   K 3.8 01/09/2016 1045   CL 99 08/30/2020 1402   CO2 32 08/30/2020 1402   CO2 27 01/09/2016 1045   BUN 14 08/30/2020 1402   BUN 14.7 01/09/2016 1045   CREATININE 1.07 08/30/2020 1402   CREATININE 1.0 01/09/2016 1045      Component Value Date/Time   CALCIUM 8.9 08/30/2020 1402   CALCIUM 9.2 01/09/2016 1045   ALKPHOS 79 08/30/2020 1402   ALKPHOS 60 01/09/2016 1045   AST 18 08/30/2020 1402   AST 18 01/09/2016 1045   ALT 17 08/30/2020 1402   ALT 15 01/09/2016 1045   BILITOT 0.7 08/30/2020 1402   BILITOT 0.51 01/09/2016 1045       No results found for: LABCA2  No components found for: RKYHCW237  No results for input(s): INR in the last 168 hours.  No results found for: LABCA2  No results found for: SEG315  No results found for: VVO160  No results found for: VPX106  No results found for: CA2729  No components found for: HGQUANT  No results found for: CEA1 / No results found for: CEA1   No results found  for: AFPTUMOR  No results found for: CHROMOGRNA  No results found for: HGBA, HGBA2QUANT, HGBFQUANT, HGBSQUAN (Hemoglobinopathy evaluation)   Lab Results  Component Value Date   LDH 136 01/09/2016      Lab Results  Component Value Date   IRON 49 03/01/2020   TIBC 238 (L) 03/01/2020   IRONPCTSAT 21 03/01/2020   (Iron and TIBC)  Lab Results  Component Value Date   FERRITIN 375 (H) 03/01/2020    Urinalysis    Component Value Date/Time   COLORURINE AMBER (A) 03/01/2020 1052   APPEARANCEUR CLOUDY (A) 03/01/2020 1052   LABSPEC 1.021 03/01/2020 1052   PHURINE 5.0 03/01/2020 1052   GLUCOSEU NEGATIVE 03/01/2020 1052   HGBUR NEGATIVE 03/01/2020 1052   BILIRUBINUR NEGATIVE 03/01/2020 1052   KETONESUR NEGATIVE 03/01/2020 1052   PROTEINUR 100 (A) 03/01/2020 1052   UROBILINOGEN 0.2 02/29/2020 1537   NITRITE NEGATIVE 03/01/2020 1052   LEUKOCYTESUR NEGATIVE 03/01/2020 1052    STUDIES: No results found.   ELIGIBLE FOR AVAILABLE RESEARCH PROTOCOL:   ASSESSMENT: 85 y.o.  67 [place] man with a history of M-GUS, presenting 02/29/2020 with a high globulin fraction, worsening renal function, hypercalcemia, anemia and multiple lytic bone lesions.   (1) IgG Kappa Multiple Myeloma: Labs diagnostic on 03/01/2020 with Mspike of 3.8, Kappa free light chain 690.6, and ratio 117.05.  (a) CT A/P on 03/01/2020 shows "innumerable" lytic lesions involving all visualized bones  (b) bone marrow biopsy 03/11/2020 shows 20% plasmacytosis by CD138, 12% by manual differential; molecular studies not requested  (c) Bortezomib and Dexamethasone given weekly 3 weeks on and 1 week off beginning 03/11/2020    (2) lytic bone lesions/ hypercalcemia:  (a) Pamidronate administered on 03/01/2020-- resolved  (b) denosumab/Xgeva to start 04/17/2020-- repeat every 12 weeks   (a) take TUMS 3 tabs on day of Xgeva dose   PLAN: Joe [name] is doing quite well.  He has started lenalidomide and is tolerating it quite well.  He denies any new concerns.    His potassium is decreased today at 2.7.  He has no symptoms of this and I cannot identify an etiology.  He will return next week for repeat labs, and in the meantime  will take potassium 70mq po BID.  We talked about food recommendations and I recommended increasing his oral intake.    Thurmon will return for his f/u in 4 weeks.  He knows to call [place] any other symptoms develop or if his current symptoms worsen  Total encounter time 20 minutes.  LWilber Bihari NP 08/30/20 3:39 PM [facility] On[facility] [place] [id number]. [phone]   Fax. [phone]   *Total Encounter Time as defined by the Centers for Medicare and Medicaid Services includes, in addition to the face-to-face time of a patient visit (documented in the note above) non-face-to-face time: obtaining and reviewing outside history, ordering and reviewing medications, tests or procedures, care coordination (communications with other health care professionals or caregivers) and documentation in the medical record.

## 2020-09-02 ENCOUNTER — Other Ambulatory Visit: Payer: Self-pay | Admitting: Oncology

## 2020-09-02 LAB — MULTIPLE MYELOMA PANEL, SERUM
Albumin SerPl Elph-Mcnc: 3.6 g/dL (ref 2.9–4.4)
Albumin/Glob SerPl: 1.4 (ref 0.7–1.7)
Alpha 1: 0.3 g/dL (ref 0.0–0.4)
Alpha2 Glob SerPl Elph-Mcnc: 0.7 g/dL (ref 0.4–1.0)
B-Globulin SerPl Elph-Mcnc: 0.8 g/dL (ref 0.7–1.3)
Gamma Glob SerPl Elph-Mcnc: 1 g/dL (ref 0.4–1.8)
Globulin, Total: 2.7 g/dL (ref 2.2–3.9)
IgA: 82 mg/dL (ref 61–437)
IgG (Immunoglobin G), Serum: 1179 mg/dL (ref 603–1613)
IgM (Immunoglobulin M), Srm: 44 mg/dL (ref 15–143)
M Protein SerPl Elph-Mcnc: 0.5 g/dL — ABNORMAL HIGH
Total Protein ELP: 6.3 g/dL (ref 6.0–8.5)

## 2020-09-02 LAB — KAPPA/LAMBDA LIGHT CHAINS
Kappa free light chain: 74 mg/L — ABNORMAL HIGH (ref 3.3–19.4)
Kappa, lambda light chain ratio: 5.65 — ABNORMAL HIGH (ref 0.26–1.65)
Lambda free light chains: 13.1 mg/L (ref 5.7–26.3)

## 2020-09-03 ENCOUNTER — Other Ambulatory Visit: Payer: Self-pay | Admitting: *Deleted

## 2020-09-03 DIAGNOSIS — C9000 Multiple myeloma not having achieved remission: Secondary | ICD-10-CM

## 2020-09-03 MED ORDER — LENALIDOMIDE 10 MG PO CAPS: 10.0000 mg | ORAL_CAPSULE | Freq: Every day | ORAL | 0 refills | Status: DC

## 2020-09-04 ENCOUNTER — Telehealth: Payer: Self-pay | Admitting: Adult Health

## 2020-09-04 NOTE — Telephone Encounter (Signed)
Called pt per 10/6 sch msg - scheduled appt , pt is aware of appt on 11/3

## 2020-09-05 ENCOUNTER — Telehealth: Payer: Self-pay | Admitting: *Deleted

## 2020-09-05 ENCOUNTER — Other Ambulatory Visit: Payer: Self-pay | Admitting: Oncology

## 2020-09-05 DIAGNOSIS — C9000 Multiple myeloma not having achieved remission: Secondary | ICD-10-CM

## 2020-09-05 NOTE — Telephone Encounter (Signed)
Received message from [employer] stating that DOB on pt script & [facility] site are different.  Verified with pt that DOB is [birth year].  Date on [facility] site is 12/29/1928.  Informed by  [employer] that we need pt to correct & initial on Pt/Physcian agreement form & fax back to them.

## 2020-09-06 DIAGNOSIS — H2512 Age-related nuclear cataract, left eye: Secondary | ICD-10-CM | POA: Diagnosis not present

## 2020-09-06 DIAGNOSIS — H52202 Unspecified astigmatism, left eye: Secondary | ICD-10-CM | POA: Diagnosis not present

## 2020-09-25 ENCOUNTER — Other Ambulatory Visit (HOSPITAL_COMMUNITY): Payer: Self-pay | Admitting: Internal Medicine

## 2020-09-25 ENCOUNTER — Ambulatory Visit: Payer: Medicare Other | Attending: Internal Medicine

## 2020-09-25 DIAGNOSIS — Z23 Encounter for immunization: Secondary | ICD-10-CM

## 2020-09-25 NOTE — Progress Notes (Signed)
   Covid-19 Vaccination Clinic  Name:  General Wearing    MRN: [id number] DOB: Feb 04, 1929  09/25/2020  Mr. Jasmin was observed post Covid-19 immunization for 15 minutes without incident. He was provided with Vaccine Information Sheet and instruction to access the V-Safe system.   Mr. Legate was instructed to call 911 with any severe reactions post vaccine: [name] Difficulty breathing  . Swelling of face and throat  . A fast heartbeat  . A bad rash all over body  . Dizziness and weakness

## 2020-09-30 ENCOUNTER — Other Ambulatory Visit: Payer: Self-pay | Admitting: Oncology

## 2020-09-30 DIAGNOSIS — D472 Monoclonal gammopathy: Secondary | ICD-10-CM

## 2020-09-30 DIAGNOSIS — C9000 Multiple myeloma not having achieved remission: Secondary | ICD-10-CM

## 2020-10-02 ENCOUNTER — Other Ambulatory Visit: Payer: Self-pay | Admitting: *Deleted

## 2020-10-02 ENCOUNTER — Encounter: Payer: Self-pay | Admitting: Adult Health

## 2020-10-02 ENCOUNTER — Other Ambulatory Visit: Payer: Self-pay

## 2020-10-02 ENCOUNTER — Inpatient Hospital Stay: Payer: Medicare Other

## 2020-10-02 ENCOUNTER — Inpatient Hospital Stay: Payer: Medicare Other | Attending: Adult Health

## 2020-10-02 ENCOUNTER — Other Ambulatory Visit: Payer: Self-pay | Admitting: Oncology

## 2020-10-02 ENCOUNTER — Inpatient Hospital Stay (HOSPITAL_BASED_OUTPATIENT_CLINIC_OR_DEPARTMENT_OTHER): Payer: Medicare Other | Admitting: Adult Health

## 2020-10-02 VITALS — BP 126/71 | HR 87 | Temp 96.9°F | Resp 17 | Ht 71.0 in | Wt 186.0 lb

## 2020-10-02 DIAGNOSIS — D649 Anemia, unspecified: Secondary | ICD-10-CM

## 2020-10-02 DIAGNOSIS — R634 Abnormal weight loss: Secondary | ICD-10-CM | POA: Diagnosis not present

## 2020-10-02 DIAGNOSIS — C9000 Multiple myeloma not having achieved remission: Secondary | ICD-10-CM | POA: Insufficient documentation

## 2020-10-02 DIAGNOSIS — M899 Disorder of bone, unspecified: Secondary | ICD-10-CM | POA: Diagnosis not present

## 2020-10-02 DIAGNOSIS — Z7189 Other specified counseling: Secondary | ICD-10-CM

## 2020-10-02 DIAGNOSIS — N179 Acute kidney failure, unspecified: Secondary | ICD-10-CM

## 2020-10-02 LAB — CMP (CANCER CENTER ONLY)
ALT: 16 U/L (ref 0–44)
AST: 19 U/L (ref 15–41)
Albumin: 3.6 g/dL (ref 3.5–5.0)
Alkaline Phosphatase: 70 U/L (ref 38–126)
Anion gap: 10 (ref 5–15)
BUN: 15 mg/dL (ref 8–23)
CO2: 31 mmol/L (ref 22–32)
Calcium: 8.8 mg/dL — ABNORMAL LOW (ref 8.9–10.3)
Chloride: 99 mmol/L (ref 98–111)
Creatinine: 1.19 mg/dL (ref 0.61–1.24)
GFR, Estimated: 59 mL/min — ABNORMAL LOW (ref >60)
Glucose, Bld: 150 mg/dL — ABNORMAL HIGH (ref 70–99)
Potassium: 2.8 mmol/L — ABNORMAL LOW (ref 3.5–5.1)
Sodium: 140 mmol/L (ref 135–145)
Total Bilirubin: 0.8 mg/dL (ref 0.3–1.2)
Total Protein: 7.2 g/dL (ref 6.5–8.1)

## 2020-10-02 LAB — CBC WITH DIFFERENTIAL (CANCER CENTER ONLY)
Abs Immature Granulocytes: 0.02 10*3/uL (ref 0.00–0.07)
Basophils Absolute: 0.1 10*3/uL (ref 0.0–0.1)
Basophils Relative: 2 %
Eosinophils Absolute: 0.7 10*3/uL — ABNORMAL HIGH (ref 0.0–0.5)
Eosinophils Relative: 13 %
HCT: 38.7 % — ABNORMAL LOW (ref 39.0–52.0)
Hemoglobin: 12.2 g/dL — ABNORMAL LOW (ref 13.0–17.0)
Immature Granulocytes: 0 %
Lymphocytes Relative: 25 %
Lymphs Abs: 1.3 10*3/uL (ref 0.7–4.0)
MCH: 24.6 pg — ABNORMAL LOW (ref 26.0–34.0)
MCHC: 31.5 g/dL (ref 30.0–36.0)
MCV: 78 fL — ABNORMAL LOW (ref 80.0–100.0)
Monocytes Absolute: 0.7 10*3/uL (ref 0.1–1.0)
Monocytes Relative: 12 %
Neutro Abs: 2.5 10*3/uL (ref 1.7–7.7)
Neutrophils Relative %: 48 %
Platelet Count: 167 10*3/uL (ref 150–400)
RBC: 4.96 MIL/uL (ref 4.22–5.81)
RDW: 12.6 % (ref 11.5–15.5)
WBC Count: 5.2 10*3/uL (ref 4.0–10.5)
nRBC: 0 % (ref 0.0–0.2)

## 2020-10-02 MED ORDER — DENOSUMAB 120 MG/1.7ML ~~LOC~~ SOLN
120.0000 mg | Freq: Once | SUBCUTANEOUS | Status: AC
  Administered 2020-10-02: 120 mg via SUBCUTANEOUS

## 2020-10-02 MED ORDER — POTASSIUM CHLORIDE ER 10 MEQ PO TBCR: 20.0000 meq | EXTENDED_RELEASE_TABLET | Freq: Two times a day (BID) | ORAL | 0 refills | Status: DC

## 2020-10-02 MED ORDER — LENALIDOMIDE 10 MG PO CAPS: 10.0000 mg | ORAL_CAPSULE | Freq: Every day | ORAL | 0 refills | Status: DC

## 2020-10-02 MED ORDER — DENOSUMAB 120 MG/1.7ML ~~LOC~~ SOLN
SUBCUTANEOUS | Status: AC
  Filled 2020-10-02: qty 1.7

## 2020-10-02 NOTE — Progress Notes (Signed)
Ok to treat with Ca 8.8. Corrected Ca = 9.12. Patient takes Calcium supplement with Xgeva treatment.  Hardie Pulley, PharmD, BCPS, BCOP

## 2020-10-02 NOTE — Progress Notes (Signed)
Per Wilber Bihari, NP. OK to give Xgeva today at 10 weeks.

## 2020-10-02 NOTE — Progress Notes (Signed)
[place]  [phone]) [phone] [phone]     ID: Joe [name] DOB: Aug 08, 1929  MR#: [id number]  ACZ#:[id number]  Patient Care Team: Jani Gravel, MD as PCP - General (Internal Medicine) Magrinat, Virgie Dad, MD as Consulting Physician (Oncology) Dr. Iantha Fallen, D.D.S., PA Scot Dock, NP OTHER MD:  CHIEF COMPLAINT: multiple myeloma  CURRENT TREATMENT: denosumab Delton See; lenalidomide   INTERVAL HISTORY: Joe [name] returns today for follow up and treatment of his multiple myeloma.    He had been receiving bortezomib/dexamethasone and his last dose was given on 86 July 24, 2020.  He was transitioned to Lenalidomide and he takes 65m daily 21 days on and 7 day off.  He is in his off week this week.  We are following chiefly his M spike Results for FERMAN, [name](MRN [id number] as of 10/02/2020 08:13  Ref. Range 04/17/2020 13:49 05/15/2020 13:29 06/12/2020 13:25 07/03/2020 09:54 08/30/2020 14:03  M Protein SerPl Elph-Mcnc Latest Ref Range: Not Observed g/dL 1.1 (H) 0.9 (H) 0.7 (H) 0.6 (H) 0.5 (H)   Finally he receives denosumab/Xgeva every 12 weeks, with this most recent dose 07/24/2020.  He is due for a dose today, and it is 2 weeks early, to get him back on schedule with his change in f/u to every 4 weeks.    REVIEW OF SYSTEMS:  LCaddenis doing well today.  He notes mild fatigue.  Intermittent pain in his lower back made worse with doing yard work such as rFreight forwarder and occ numbness in his feet.  He says the numbness has been slightly worse over the past week.  He denies any focal weakness, bowel/bladder incontinence.    His potassium is again 2.8.  He denies taking any medications that are causing increased urination.  His weight is decreased by 4 more pounds and states he is feeling well.  He denies any diarrhea.  A detailed ROS was otherwise non contributory today.    HISTORY OF CURRENT ILLNESS: From the original intake note:  Joe [name] an 86y/o  GGuyanaman formerly followed by my partner Dr MJulien Nordmannfor M-GUS. We have an M-Spike of 0.87 documented 07/12/2012. [name] kappa/lambda light chains and total IgG were follower yearly until 01/2016, when they were 2.36 and 1517 respectively (unremarkable). He was lost to follow-up after that point. Yesterday [02/29/2020] the patient presented to Urgent Care with poorly controlled pain. This was felt to be mussculoskeletal and he was started on a medrol dose-pak and robaxin. However his pain worsened and [facility] but labs showed a creatinine of 1.84 (baseline 1.0), calcium 12.7 with albumin 2.8 and total protein 10.4 (globulin fraction 7.8). CT of the abdomen obtained for evaluation of his abdominal and back pain showed no hydronephrosis but multiple lytic lesons. We were consulted for further evaluation and treatment of likely multiple myeloma and he was admitted to the hospital.   The patient's subsequent history is as detailed below.   PAST MEDICAL HISTORY: Past Medical History:  Diagnosis Date  . Enlarged prostate   . Hypertension     PAST SURGICAL HISTORY: Past Surgical History:  Procedure Laterality Date  . EYE SURGERY    . PROSTATE SURGERY      FAMILY HISTORY Family History  Problem Relation Age of Onset  . Hypertension Mother     SOCIAL HISTORY:  Retired, used to work for the [employer] Lives with wife [place] Has  4 children, 2 in [place], one in [place], one in [place]; 5 grandchildren and 5 great-grandchildren. Attands a [employer].    ADVANCED DIRECTIVES: at the initial visit the patient confirmed his wife is his HCPOA (despite her memory issues).   HEALTH MAINTENANCE: Social History   Tobacco Use  . Smoking status: Never Smoker  . Smokeless tobacco: Never Used  Vaping Use  . Vaping Use: Never used  Substance Use Topics  . Alcohol use: Never  . Drug  use: Never     Colonoscopy: 6-8 years ago     No Known Allergies  Current Outpatient Medications  Medication Sig Dispense Refill  . acetaminophen (TYLENOL) 500 MG tablet Take 1,000 mg by mouth every 6 (six) hours as needed for mild pain.    [name] acyclovir (ZOVIRAX) 400 MG tablet Take 1 tablet by mouth twice daily 60 tablet 0  . amLODipine (NORVASC) 5 MG tablet Take 5 mg by mouth daily.    . calcium carbonate (TUMS - DOSED IN MG ELEMENTAL CALCIUM) 500 MG chewable tablet Chew 3 tablets by mouth daily.    [name] doxazosin (CARDURA) 4 MG tablet Take 4 mg by mouth at bedtime.    . famotidine (PEPCID) 20 MG tablet Take 1 tablet by mouth once daily 30 tablet 0  . finasteride (PROSCAR) 5 MG tablet Take 1 tablet by mouth once daily 30 tablet 0  . indapamide (LOZOL) 2.5 MG tablet Take 2.5 mg by mouth daily.     [name] lenalidomide (REVLIMID) 10 MG capsule Take 1 capsule (10 mg total) by mouth daily. Adult male [place] # [id number]     Date Obtained 09/03/2020 21 capsule 0  . magnesium citrate SOLN Take 1/2 bottle if no bowel movement in 2 hours repeat 195 mL 1  . polyethylene glycol powder (MIRALAX) 17 GM/SCOOP powder Take 255 g by mouth daily. 255 g 0  . potassium chloride (KLOR-CON) 10 MEQ tablet Take 2 tablets (20 mEq total) by mouth 2 (two) times daily. 40 tablet 0  . pravastatin (PRAVACHOL) 40 MG tablet Take 40 mg by mouth daily.    . prochlorperazine (COMPAZINE) 10 MG tablet Take 1 tablet (10 mg total) by mouth every 6 (six) hours as needed (Nausea or vomiting). 30 tablet 1  . senna-docusate (SENOKOT-S) 8.6-50 MG tablet Take 2 tablets by mouth 2 (two) times daily. (Patient taking differently: Take 2 tablets by mouth 2 (two) times daily as needed. ) 120 tablet 0  . tamsulosin (FLOMAX) 0.4 MG CAPS capsule Take 1 capsule (0.4 mg total) by mouth daily after supper. 30 capsule 6  . traMADol (ULTRAM) 50 MG tablet Take 1 tablet (50 mg total) by mouth every 8 (eight) hours as needed for severe pain. 60 tablet 0    . vitamin B-12 (CYANOCOBALAMIN) 1000 MCG tablet Take 1,000 mcg by mouth daily.    [name] aspirin EC 81 MG tablet Take 81 mg by mouth daily. Swallow whole. (Patient not taking: Reported on 10/02/2020)     No current facility-administered medications for this visit.    OBJECTIVE: Vitals:   10/02/20 0824  BP: 126/71  Pulse: 87  Resp: 17  Temp: (!) 96.9 F (36.1 C)  SpO2: 100%     Body mass index is 25.94 kg/m.   Wt Readings from Last 3 Encounters:  10/02/20 186 lb (84.4 kg)  08/30/20 190 lb 4.8 oz (86.3 kg)  07/31/20 196 lb 6.4 oz (89.1 kg)  ECOG FS:1 - Symptomatic but completely ambulatory GENERAL:  Patient is a well appearing older male in no acute distress HEENT:  Sclerae anicteric. Mask in place.. Neck is supple.  NODES:  No cervical, supraclavicular, or axillary lymphadenopathy palpated.  LUNGS:  Clear to auscultation bilaterally.  No wheezes or rhonchi. HEART:  Regular rate and rhythm. No murmur appreciated. ABDOMEN:  Soft, nontender.  Positive, normoactive bowel sounds. No organomegaly palpated. MSK:  No focal spinal tenderness to palpation. Full range of motion bilaterally in the upper extremities.  Strength 5/5 in upper and lower extremities. EXTREMITIES:  No peripheral edema.   SKIN:  Clear with no obvious rashes or skin changes. No nail dyscrasia. NEURO:  Nonfocal. Well oriented.  Appropriate affect.    LAB RESULTS:  CMP     Component Value Date/Time   NA 140 10/02/2020 0811   NA 140 01/09/2016 1045   K 2.8 (L) 10/02/2020 0811   K 3.8 01/09/2016 1045   CL 99 10/02/2020 0811   CO2 31 10/02/2020 0811   CO2 27 01/09/2016 1045   GLUCOSE 150 (H) 10/02/2020 0811   GLUCOSE 89 01/09/2016 1045   BUN 15 10/02/2020 0811   BUN 14.7 01/09/2016 1045   CREATININE 1.19 10/02/2020 0811   CREATININE 1.0 01/09/2016 1045   CALCIUM 8.8 (L) 10/02/2020 0811   CALCIUM 9.2 01/09/2016 1045   PROT 7.2 10/02/2020 0811   PROT 7.3 01/09/2016 1045   ALBUMIN 3.6 10/02/2020 0811   ALBUMIN  3.7 01/09/2016 1045   AST 19 10/02/2020 0811   AST 18 01/09/2016 1045   ALT 16 10/02/2020 0811   ALT 15 01/09/2016 1045   ALKPHOS 70 10/02/2020 0811   ALKPHOS 60 01/09/2016 1045   BILITOT 0.8 10/02/2020 0811   BILITOT 0.51 01/09/2016 1045   GFRNONAA 59 (L) 10/02/2020 0811   GFRAA >60 08/30/2020 1402    Lab Results  Component Value Date   TOTALPROTELP 6.3 08/30/2020   ALBUMINELP 56.1 07/12/2012   A1GS 3.5 07/12/2012   A2GS 9.8 07/12/2012   BETS 5.1 07/12/2012   BETA2SER 3.6 07/12/2012   GAMS 21.9 (H) 07/12/2012   MSPIKE 0.87 07/12/2012   SPEI * 07/12/2012     Lab Results  Component Value Date   KPAFRELGTCHN 74.0 (H) 08/30/2020   LAMBDASER 13.1 08/30/2020   KAPLAMBRATIO 5.65 (H) 08/30/2020    Lab Results  Component Value Date   WBC 5.2 10/02/2020   NEUTROABS 2.5 10/02/2020   HGB 12.2 (L) 10/02/2020   HCT 38.7 (L) 10/02/2020   MCV 78.0 (L) 10/02/2020   PLT 167 10/02/2020      Chemistry      Component Value Date/Time   NA 140 10/02/2020 0811   NA 140 01/09/2016 1045   K 2.8 (L) 10/02/2020 0811   K 3.8 01/09/2016 1045   CL 99 10/02/2020 0811   CO2 31 10/02/2020 0811   CO2 27 01/09/2016 1045   BUN 15 10/02/2020 0811   BUN 14.7 01/09/2016 1045   CREATININE 1.19 10/02/2020 0811   CREATININE 1.0 01/09/2016 1045      Component Value Date/Time   CALCIUM 8.8 (L) 10/02/2020 0811   CALCIUM 9.2 01/09/2016 1045   ALKPHOS 70 10/02/2020 0811   ALKPHOS 60 01/09/2016 1045   AST 19 10/02/2020 0811   AST 18 01/09/2016 1045   ALT 16 10/02/2020 0811   ALT 15 01/09/2016 1045   BILITOT 0.8 10/02/2020 0811   BILITOT 0.51 01/09/2016 1045       No results found for: LABCA2  No components found for:  GYJEHU314  No results for input(s): INR in the last 168 hours.  No results found for: LABCA2  No results found for: HFW263  No results found for: ZCH885  No results found for: OYD741  No results found for: CA2729  No components found for: HGQUANT  No results  found for: CEA1 / No results found for: CEA1   No results found for: AFPTUMOR  No results found for: CHROMOGRNA  No results found for: HGBA, HGBA2QUANT, HGBFQUANT, HGBSQUAN (Hemoglobinopathy evaluation)   Lab Results  Component Value Date   LDH 136 01/09/2016    Lab Results  Component Value Date   IRON 49 03/01/2020   TIBC 238 (L) 03/01/2020   IRONPCTSAT 21 03/01/2020   (Iron and TIBC)  Lab Results  Component Value Date   FERRITIN 375 (H) 03/01/2020    Urinalysis    Component Value Date/Time   COLORURINE AMBER (A) 03/01/2020 1052   APPEARANCEUR CLOUDY (A) 03/01/2020 1052   LABSPEC 1.021 03/01/2020 1052   PHURINE 5.0 03/01/2020 1052   GLUCOSEU NEGATIVE 03/01/2020 1052   HGBUR NEGATIVE 03/01/2020 1052   BILIRUBINUR NEGATIVE 03/01/2020 1052   KETONESUR NEGATIVE 03/01/2020 1052   PROTEINUR 100 (A) 03/01/2020 1052   UROBILINOGEN 0.2 02/29/2020 1537   NITRITE NEGATIVE 03/01/2020 [place] 03/01/2020 1052    STUDIES: No results found.   ELIGIBLE FOR AVAILABLE RESEARCH PROTOCOL:   ASSESSMENT: 86 y.o.  39 [place] man with a history of M-GUS, presenting 02/29/2020 with a high globulin fraction, worsening renal function, hypercalcemia, anemia and multiple lytic bone lesions.   (1) IgG Kappa Multiple Myeloma: Labs diagnostic on 03/01/2020 with Mspike of 3.8, Kappa free light chain 690.6, and ratio 117.05.  (a) CT A/P on 03/01/2020 shows "innumerable" lytic lesions involving all visualized bones  (b) bone marrow biopsy 03/11/2020 shows 20% plasmacytosis by CD138, 12% by manual differential; molecular studies not requested  (c) Bortezomib and Dexamethasone given weekly 3 weeks on and 1 week off beginning 03/11/2020    (2) lytic bone lesions/ hypercalcemia:  (a) Pamidronate administered on 03/01/2020-- resolved  (b) denosumab/Xgeva to start 04/17/2020-- repeat every 12 weeks   (a) take TUMS 3 tabs on day of Xgeva dose   PLAN: Mr. Orsak continues on  Lynn given 3 weeks on and 1 week off, with Xgeva every 12 weeks.  His M spike has remained stable and he will continue on his current treatment.    He is having some intermittent back pain.  This is not worse with lying down, but worse with activities such as raking his yard.  I have placed orders for thoracic and lumbar plain films for him to undergo at his earliest convenience at [facility].  I asked for him to let me know if it worsens, as he may need an MRI.    His potassium is 2.8 again.  I have refilled his potassium prescription and have requested he return in 1 week for lab f/u.  I placed orders for him to undergo BMP at that time.    Due to his decreased weight I placed a nutrition consult.    Torrian will return for his f/u in 4 weeks.  He knows to call us if any other symptoms develop or if his current symptoms worsen.    Total encounter time 30 minutes*  Wilber Bihari, NP 10/02/20 9:47 AM Medical Oncology and Hematology [facility] [address] [place], [place] [address] Tel. [phone]  Fax. [phone]    *Total Encounter Time as defined by the Centers for Medicare and Medicaid Services includes, in addition to the face-to-face time of a patient visit (documented in the note above) non-face-to-face time: obtaining and reviewing outside history, ordering and reviewing medications, tests or procedures, care coordination (communications with other health care professionals or caregivers) and documentation in the medical record.

## 2020-10-03 ENCOUNTER — Other Ambulatory Visit: Payer: Self-pay

## 2020-10-03 DIAGNOSIS — C9000 Multiple myeloma not having achieved remission: Secondary | ICD-10-CM

## 2020-10-03 LAB — KAPPA/LAMBDA LIGHT CHAINS
Kappa free light chain: 67.7 mg/L — ABNORMAL HIGH (ref 3.3–19.4)
Kappa, lambda light chain ratio: 3.94 — ABNORMAL HIGH (ref 0.26–1.65)
Lambda free light chains: 17.2 mg/L (ref 5.7–26.3)

## 2020-10-03 MED ORDER — LENALIDOMIDE 10 MG PO CAPS: 10.0000 mg | ORAL_CAPSULE | Freq: Every day | ORAL | 0 refills | Status: DC

## 2020-10-04 LAB — MULTIPLE MYELOMA PANEL, SERUM
Albumin SerPl Elph-Mcnc: 3.4 g/dL (ref 2.9–4.4)
Albumin/Glob SerPl: 1.2 (ref 0.7–1.7)
Alpha 1: 0.2 g/dL (ref 0.0–0.4)
Alpha2 Glob SerPl Elph-Mcnc: 0.7 g/dL (ref 0.4–1.0)
B-Globulin SerPl Elph-Mcnc: 0.9 g/dL (ref 0.7–1.3)
Gamma Glob SerPl Elph-Mcnc: 1.2 g/dL (ref 0.4–1.8)
Globulin, Total: 3 g/dL (ref 2.2–3.9)
IgA: 92 mg/dL (ref 61–437)
IgG (Immunoglobin G), Serum: 1322 mg/dL (ref 603–1613)
IgM (Immunoglobulin M), Srm: 34 mg/dL (ref 15–143)
M Protein SerPl Elph-Mcnc: 0.4 g/dL — ABNORMAL HIGH
Total Protein ELP: 6.4 g/dL (ref 6.0–8.5)

## 2020-10-07 ENCOUNTER — Telehealth: Payer: Self-pay

## 2020-10-07 NOTE — Telephone Encounter (Signed)
Contacted patient to verify telephone visit for pre reg °

## 2020-10-08 ENCOUNTER — Inpatient Hospital Stay: Payer: Medicare Other

## 2020-10-08 NOTE — Progress Notes (Signed)
Nutrition Follow-up:  Patient with multiple myeloma.  Patient taking lenalidomide and denosumab/xgeva.  Past medical history of HTN.   Spoke with patient via phone.  Patient reports good appetite although eats less than before.  Reports at times some queasiness.  Takes nausea medications when has symptoms.  Reports typically eats egg and breakfast meat or cereal for breakfast.  Lunch is sandwich and supper is meat and couple side items.  Patient and wife prepare meals.  Does not really snack during the day.  Has not tried ensure/boost shakes.     Medications: reviewed  Labs: reviewed  Anthropometrics:   Height: 71 inches Weight: 186 lb 11/3 196 lb 9/1 BMI: 25  5% weight loss in the last 2 months, concerning  NUTRITION DIAGNOSIS: Inadequate oral intake related to cancer related treatment side effects as evidenced by 5% weight loss in 2 months and eating small volume of foods.    INTERVENTION:  Discussed importance of good nutrition and weight maintenance.   Encouraged taking nausea medications to control symptoms and allow to eat. Encouraged good sources of protein.  Contact information provided    MONITORING, EVALUATION, GOAL: weight trends, intake   NEXT VISIT: Dec 7 phone f/u  Layan Zalenski B. Zenia Resides, [place], [place] Registered Dietitian [phone] (mobile)

## 2020-10-09 ENCOUNTER — Other Ambulatory Visit: Payer: Self-pay

## 2020-10-09 ENCOUNTER — Inpatient Hospital Stay: Payer: Medicare Other

## 2020-10-09 DIAGNOSIS — C9000 Multiple myeloma not having achieved remission: Secondary | ICD-10-CM | POA: Diagnosis not present

## 2020-10-09 DIAGNOSIS — M899 Disorder of bone, unspecified: Secondary | ICD-10-CM | POA: Diagnosis not present

## 2020-10-09 LAB — CBC WITH DIFFERENTIAL (CANCER CENTER ONLY)
Abs Immature Granulocytes: 0.01 10*3/uL (ref 0.00–0.07)
Basophils Absolute: 0.1 10*3/uL (ref 0.0–0.1)
Basophils Relative: 1 %
Eosinophils Absolute: 0.3 10*3/uL (ref 0.0–0.5)
Eosinophils Relative: 6 %
HCT: 36.7 % — ABNORMAL LOW (ref 39.0–52.0)
Hemoglobin: 11.4 g/dL — ABNORMAL LOW (ref 13.0–17.0)
Immature Granulocytes: 0 %
Lymphocytes Relative: 29 %
Lymphs Abs: 1.3 10*3/uL (ref 0.7–4.0)
MCH: 24.9 pg — ABNORMAL LOW (ref 26.0–34.0)
MCHC: 31.1 g/dL (ref 30.0–36.0)
MCV: 80.1 fL (ref 80.0–100.0)
Monocytes Absolute: 0.7 10*3/uL (ref 0.1–1.0)
Monocytes Relative: 14 %
Neutro Abs: 2.2 10*3/uL (ref 1.7–7.7)
Neutrophils Relative %: 50 %
Platelet Count: 175 10*3/uL (ref 150–400)
RBC: 4.58 MIL/uL (ref 4.22–5.81)
RDW: 12.7 % (ref 11.5–15.5)
WBC Count: 4.5 10*3/uL (ref 4.0–10.5)
nRBC: 0 % (ref 0.0–0.2)

## 2020-10-09 LAB — BASIC METABOLIC PANEL - CANCER CENTER ONLY
Anion gap: 6 (ref 5–15)
BUN: 13 mg/dL (ref 8–23)
CO2: 31 mmol/L (ref 22–32)
Calcium: 8.6 mg/dL — ABNORMAL LOW (ref 8.9–10.3)
Chloride: 102 mmol/L (ref 98–111)
Creatinine: 0.98 mg/dL (ref 0.61–1.24)
GFR, Estimated: >60 mL/min (ref >60)
Glucose, Bld: 118 mg/dL — ABNORMAL HIGH (ref 70–99)
Potassium: 3.2 mmol/L — ABNORMAL LOW (ref 3.5–5.1)
Sodium: 139 mmol/L (ref 135–145)

## 2020-10-16 ENCOUNTER — Other Ambulatory Visit: Payer: Self-pay | Admitting: Oncology

## 2020-10-22 ENCOUNTER — Other Ambulatory Visit: Payer: Self-pay | Admitting: Oncology

## 2020-10-30 ENCOUNTER — Encounter: Payer: Self-pay | Admitting: Adult Health

## 2020-10-30 ENCOUNTER — Inpatient Hospital Stay: Payer: Medicare Other

## 2020-10-30 ENCOUNTER — Inpatient Hospital Stay: Payer: Medicare Other | Attending: Adult Health | Admitting: Adult Health

## 2020-10-30 ENCOUNTER — Other Ambulatory Visit: Payer: Self-pay

## 2020-10-30 VITALS — BP 165/61 | HR 76 | Temp 97.7°F | Resp 18 | Ht 71.0 in | Wt 186.5 lb

## 2020-10-30 DIAGNOSIS — C9000 Multiple myeloma not having achieved remission: Secondary | ICD-10-CM

## 2020-10-30 DIAGNOSIS — Z7189 Other specified counseling: Secondary | ICD-10-CM

## 2020-10-30 DIAGNOSIS — D649 Anemia, unspecified: Secondary | ICD-10-CM

## 2020-10-30 DIAGNOSIS — N179 Acute kidney failure, unspecified: Secondary | ICD-10-CM

## 2020-10-30 LAB — CMP (CANCER CENTER ONLY)
ALT: 12 U/L (ref 0–44)
AST: 17 U/L (ref 15–41)
Albumin: 3.4 g/dL — ABNORMAL LOW (ref 3.5–5.0)
Alkaline Phosphatase: 68 U/L (ref 38–126)
Anion gap: 8 (ref 5–15)
BUN: 15 mg/dL (ref 8–23)
CO2: 31 mmol/L (ref 22–32)
Calcium: 9.1 mg/dL (ref 8.9–10.3)
Chloride: 100 mmol/L (ref 98–111)
Creatinine: 1.13 mg/dL (ref 0.61–1.24)
GFR, Estimated: >60 mL/min (ref >60)
Glucose, Bld: 157 mg/dL — ABNORMAL HIGH (ref 70–99)
Potassium: 3 mmol/L — ABNORMAL LOW (ref 3.5–5.1)
Sodium: 139 mmol/L (ref 135–145)
Total Bilirubin: 0.6 mg/dL (ref 0.3–1.2)
Total Protein: 6.9 g/dL (ref 6.5–8.1)

## 2020-10-30 LAB — CBC WITH DIFFERENTIAL (CANCER CENTER ONLY)
Abs Immature Granulocytes: 0.01 10*3/uL (ref 0.00–0.07)
Basophils Absolute: 0 10*3/uL (ref 0.0–0.1)
Basophils Relative: 1 %
Eosinophils Absolute: 0.2 10*3/uL (ref 0.0–0.5)
Eosinophils Relative: 6 %
HCT: 38.5 % — ABNORMAL LOW (ref 39.0–52.0)
Hemoglobin: 11.9 g/dL — ABNORMAL LOW (ref 13.0–17.0)
Immature Granulocytes: 0 %
Lymphocytes Relative: 25 %
Lymphs Abs: 0.9 10*3/uL (ref 0.7–4.0)
MCH: 24.5 pg — ABNORMAL LOW (ref 26.0–34.0)
MCHC: 30.9 g/dL (ref 30.0–36.0)
MCV: 79.4 fL — ABNORMAL LOW (ref 80.0–100.0)
Monocytes Absolute: 0.5 10*3/uL (ref 0.1–1.0)
Monocytes Relative: 15 %
Neutro Abs: 1.8 10*3/uL (ref 1.7–7.7)
Neutrophils Relative %: 53 %
Platelet Count: 181 10*3/uL (ref 150–400)
RBC: 4.85 MIL/uL (ref 4.22–5.81)
RDW: 13.3 % (ref 11.5–15.5)
WBC Count: 3.4 10*3/uL — ABNORMAL LOW (ref 4.0–10.5)
nRBC: 0 % (ref 0.0–0.2)

## 2020-10-30 MED ORDER — POTASSIUM CHLORIDE ER 10 MEQ PO TBCR: 20.0000 meq | EXTENDED_RELEASE_TABLET | Freq: Every day | ORAL | 2 refills | Status: DC

## 2020-10-30 NOTE — Progress Notes (Signed)
**Note Joe-Identified via Obfuscation** [place]  Telephone:(336) [phone] Fax:(336) [phone]     ID: Joe [name] DOB: [birth year]  MR#: [id number]  AVW#:[id number]  Patient Care Team: Jani Gravel, MD as PCP - General (Internal Medicine) Magrinat, Virgie Dad, MD as Consulting Physician (Oncology) Dr. Iantha Fallen, D.D.S., PA Scot Dock, NP OTHER MD:  CHIEF COMPLAINT: multiple myeloma  CURRENT TREATMENT: denosumab Del[place] See; lenalidomide   INTERVAL HISTORY: Joe [name] returns today for follow up and treatment of his multiple myeloma.    He had been receiving bortezomib/dexamethasone and his last dose was given on July 24, 2020.  He was transitioned to Lenalidomide and he takes 72m daily 21 days on and 7 day off.  He is in his off week this week.  We are following chiefly his M spike Results for FMARWAN, LIPE(MRN [id number] as of 10/02/2020 08:13  Ref. Range 04/17/2020 13:49 05/15/2020 13:29 06/12/2020 13:25 07/03/2020 09:54 08/30/2020 14:03  M Protein SerPl Elph-Mcnc Latest Ref Range: Not Observed g/dL 1.1 (H) 0.9 (H) 0.7 (H) 0.6 (H) 0.5 (H)   Finally he receives denosumab/Xgeva every 12 weeks, and most recently received this on 10/02/2020  REVIEW OF SYSTEMS:  Dewight continues to feel well.  He has some mild fatigue, but denies any fever, chills, diarrhea, rash, headaches, dizziness, new pain.  His weight is stable today, and he notes he is eating well and has a good appetite.  A detailed ROS was otherwise non contributory.     HISTORY OF CURRENT ILLNESS: From the original intake note:  Joe [name] an 86y/o GGuyanaman formerly followed by my partner Dr MJulien Nordmannfor M-GUS. We have an M-Spike of 0.87 documented 07/12/2012. [name] kappa/lambda light chains and total IgG were follower yearly until 01/2016, when they were 2.36 and 1517 respectively (unremarkable). He was lost to follow-up after that point. Yesterday [02/29/2020] the patient presented to Urgent Care with poorly controlled pain. This was  felt to be mussculoskeletal and he was started on a medrol dose-pak and robaxin. However his pain worsened and [facility] but labs showed a creatinine of 1.84 (baseline 1.0), calcium 12.7 with albumin 2.8 and total protein 10.4 (globulin fraction 7.8). CT of the abdomen obtained for evaluation of his abdominal and back pain showed no hydronephrosis but multiple lytic lesons. We were consulted for further evaluation and treatment of likely multiple myeloma and he was admitted to the hospital.   The patient's subsequent history is as detailed below.   PAST MEDICAL HISTORY: Past Medical History:  Diagnosis Date  . Enlarged prostate   . Hypertension     PAST SURGICAL HISTORY: Past Surgical History:  Procedure Laterality Date  . EYE SURGERY    . PROSTATE SURGERY      FAMILY HISTORY Family History  Problem Relation Age of Onset  . Hypertension Mother     SOCIAL HISTORY:  Retired, used to work for the [employer] [place] 4 children, [place] 5 grandchildren and 5 great-grandchildren. Attands a [employer]    ADVANCED DIRECTIVES: at the initial visit the patient confirmed his wife is his HCPOA (despite her memory issues).   HEALTH MAINTENANCE: Social History   Tobacco Use  . Smoking status: Never Smoker  . Smokeless tobacco: Never Used  Vaping Use  . Vaping Use: Never used  Substance Use Topics  . Alcohol use: Never  .  Drug use: Never     Colonoscopy: 6-8 years ago     No Known Allergies  Current Outpatient Medications  Medication Sig Dispense Refill  . acetaminophen (TYLENOL) 500 MG tablet Take 1,000 mg by mouth every 6 (six) hours as needed for mild pain.    [name] acyclovir (ZOVIRAX) 400 MG tablet Take 1 tablet by mouth twice daily 60 tablet 0  . amLODipine (NORVASC) 5 MG tablet Take 5 mg by mouth daily.    [name]  aspirin EC 81 MG tablet Take 81 mg by mouth daily. Swallow whole.     . calcium carbonate (TUMS - DOSED IN MG ELEMENTAL CALCIUM) 500 MG chewable tablet Chew 3 tablets by mouth daily.    [name] doxazosin (CARDURA) 4 MG tablet Take 4 mg by mouth at bedtime.    . famotidine (PEPCID) 20 MG tablet Take 1 tablet by mouth once daily 90 tablet 0  . finasteride (PROSCAR) 5 MG tablet Take 1 tablet by mouth once daily 30 tablet 0  . indapamide (LOZOL) 2.5 MG tablet Take 2.5 mg by mouth daily.     [name] lenalidomide (REVLIMID) 10 MG capsule Take 1 capsule (10 mg total) by mouth daily. Adult male [place] # [id number]     Date Obtained 10/02/2020 21 capsule 0  . magnesium citrate SOLN Take 1/2 bottle if no bowel movement in 2 hours repeat 195 mL 1  . ondansetron (ZOFRAN) 8 MG tablet TAKE 1 TABLET BY MOUTH THREE TIMES DAILY AS NEEDED FOR NAUSEA FOR VOMITING 20 tablet 0  . polyethylene glycol powder (MIRALAX) 17 GM/SCOOP powder Take 255 g by mouth daily. 255 g 0  . potassium chloride (KLOR-CON) 10 MEQ tablet Take 2 tablets (20 mEq total) by mouth 2 (two) times daily. 40 tablet 0  . pravastatin (PRAVACHOL) 40 MG tablet Take 40 mg by mouth daily.    . prochlorperazine (COMPAZINE) 10 MG tablet Take 1 tablet (10 mg total) by mouth every 6 (six) hours as needed (Nausea or vomiting). 30 tablet 1  . senna-docusate (SENOKOT-S) 8.6-50 MG tablet Take 2 tablets by mouth 2 (two) times daily. (Patient taking differently: Take 2 tablets by mouth 2 (two) times daily as needed. ) 120 tablet 0  . tamsulosin (FLOMAX) 0.4 MG CAPS capsule Take 1 capsule (0.4 mg total) by mouth daily after supper. 30 capsule 6  . traMADol (ULTRAM) 50 MG tablet Take 1 tablet (50 mg total) by mouth every 8 (eight) hours as needed for severe pain. 60 tablet 0  . vitamin B-12 (CYANOCOBALAMIN) 1000 MCG tablet Take 1,000 mcg by mouth daily.     No current facility-administered medications for this visit.    OBJECTIVE: Vitals:   10/30/20 1157  BP: (!) 165/61    Pulse: 76  Resp: 18  Temp: 97.7 F (36.5 C)  SpO2: 100%     Body mass index is 26.01 kg/m.   Wt Readings from Last 3 Encounters:  10/30/20 186 lb 8 oz (84.6 kg)  10/02/20 186 lb (84.4 kg)  08/30/20 190 lb 4.8 oz (86.3 kg)  ECOG FS:1 - Symptomatic but completely ambulatory GENERAL: Patient is a well appearing older male in no acute distress HEENT:  Sclerae anicteric. Mask in place.. Neck is supple.  NODES:  No cervical, supraclavicular, or axillary lymphadenopathy palpated.  LUNGS:  Clear to auscultation bilaterally.  No wheezes or rhonchi. HEART:  Regular rate and rhythm. No murmur appreciated. ABDOMEN:  Soft, nontender.  Positive, normoactive bowel sounds. No organomegaly palpated. MSK:  No focal spinal tenderness to palpation. Full range of motion bilaterally in the upper extremities.  Strength 5/5 in upper and lower extremities. EXTREMITIES:  No peripheral edema.   SKIN:  Clear with no obvious rashes or skin changes. No nail dyscrasia. NEURO:  Nonfocal. Well oriented.  Appropriate affect.    LAB RESULTS:  CMP     Component Value Date/Time   NA 139 10/30/2020 1040   NA 140 01/09/2016 1045   K 3.0 (L) 10/30/2020 1040   K 3.8 01/09/2016 1045   CL 100 10/30/2020 1040   CO2 31 10/30/2020 1040   CO2 27 01/09/2016 1045   GLUCOSE 157 (H) 10/30/2020 1040   GLUCOSE 89 01/09/2016 1045   BUN 15 10/30/2020 1040   BUN 14.7 01/09/2016 1045   CREATININE 1.13 10/30/2020 1040   CREATININE 1.0 01/09/2016 1045   CALCIUM 9.1 10/30/2020 1040   CALCIUM 9.2 01/09/2016 1045   PROT 6.9 10/30/2020 1040   PROT 7.3 01/09/2016 1045   ALBUMIN 3.4 (L) 10/30/2020 1040   ALBUMIN 3.7 01/09/2016 1045   AST 17 10/30/2020 1040   AST 18 01/09/2016 1045   ALT 12 10/30/2020 1040   ALT 15 01/09/2016 1045   ALKPHOS 68 10/30/2020 1040   ALKPHOS 60 01/09/2016 1045   BILITOT 0.6 10/30/2020 1040   BILITOT 0.51 01/09/2016 1045   GFRNONAA >60 10/30/2020 1040   GFRAA >60 08/30/2020 1402    Lab  Results  Component Value Date   TOTALPROTELP 6.4 10/02/2020   ALBUMINELP 56.1 07/12/2012   A1GS 3.5 07/12/2012   A2GS 9.8 07/12/2012   BETS 5.1 07/12/2012   BETA2SER 3.6 07/12/2012   GAMS 21.9 (H) 07/12/2012   MSPIKE 0.87 07/12/2012   SPEI * 07/12/2012     Lab Results  Component Value Date   KPAFRELGTCHN 67.7 (H) 10/02/2020   LAMBDASER 17.2 10/02/2020   KAPLAMBRATIO 3.94 (H) 10/02/2020    Lab Results  Component Value Date   WBC 3.4 (L) 10/30/2020   NEUTROABS 1.8 10/30/2020   HGB 11.9 (L) 10/30/2020   HCT 38.5 (L) 10/30/2020   MCV 79.4 (L) 10/30/2020   PLT 181 10/30/2020      Chemistry      Component Value Date/Time   NA 139 10/30/2020 1040   NA 140 01/09/2016 1045   K 3.0 (L) 10/30/2020 1040   K 3.8 01/09/2016 1045   CL 100 10/30/2020 1040   CO2 31 10/30/2020 1040   CO2 27 01/09/2016 1045   BUN 15 10/30/2020 1040   BUN 14.7 01/09/2016 1045   CREATININE 1.13 10/30/2020 1040   CREATININE 1.0 01/09/2016 1045      Component Value Date/Time   CALCIUM 9.1 10/30/2020 1040   CALCIUM 9.2 01/09/2016 1045   ALKPHOS 68 10/30/2020 1040   ALKPHOS 60 01/09/2016 1045   AST 17 10/30/2020 1040   AST 18 01/09/2016 1045   ALT 12 10/30/2020 1040   ALT 15 01/09/2016 1045   BILITOT 0.6 10/30/2020 1040   BILITOT 0.51 01/09/2016 1045       No results found for: LABCA2  No components found for: KPTWSF681  No results for input(s): INR in the last 168 hours.  No results found for: LABCA2  No results found for: EXN170  No results found for: YFV494  No results found for: WHQ759  No results found for: CA2729  No components found for: HGQUANT  No results found for: CEA1 / No results found for: CEA1   No results found for: [facility]  No results found for: CHROMOGRNA  No results found for: HGBA, HGBA2QUANT, HGBFQUANT, HGBSQUAN (Hemoglobinopathy evaluation)   Lab Results  Component Value Date   LDH 136 01/09/2016    Lab Results  Component Value Date   IRON  49 03/01/2020   TIBC 238 (L) 03/01/2020   IRONPCTSAT 21 03/01/2020   (Iron and TIBC)  Lab Results  Component Value Date   FERRITIN 375 (H) 03/01/2020    Urinalysis    Component Value Date/Time   COLORURINE AMBER (A) 03/01/2020 1052   APPEARANCEUR CLOUDY (A) 03/01/2020 1052   LABSPEC 1.021 03/01/2020 1052   PHURINE 5.0 03/01/2020 1052   GLUCOSEU NEGATIVE 03/01/2020 1052   HGBUR NEGATIVE 03/01/2020 1052   BILIRUBINUR NEGATIVE 03/01/2020 1052   KETONESUR NEGATIVE 03/01/2020 1052   PROTEINUR 100 (A) 03/01/2020 1052   UROBILINOGEN 0.2 02/29/2020 1537   NITRITE NEGATIVE 03/01/2020 1052   LEUKOCYTESUR NEGATIVE 03/01/2020 1052    STUDIES: No results found.   ELIGIBLE FOR AVAILABLE RESEARCH PROTOCOL:   ASSESSMENT: 86 y.o.  31 [place] man with a history of M-GUS, presenting 02/29/2020 with a high globulin fraction, worsening renal function, hypercalcemia, anemia and multiple lytic bone lesions.   (1) IgG Kappa Multiple Myeloma: Labs diagnostic on 03/01/2020 with Mspike of 3.8, Kappa free light chain 690.6, and ratio 117.05.  (a) CT A/P on 03/01/2020 shows "innumerable" lytic lesions involving all visualized bones  (b) bone marrow biopsy 03/11/2020 shows 20% plasmacytosis by CD138, 12% by manual differential; molecular studies not requested  (c) Bortezomib and Dexamethasone given weekly 3 weeks on and 1 week off beginning 03/11/2020    (2) lytic bone lesions/ hypercalcemia:  (a) Pamidronate administered on 03/01/2020-- resolved  (b) denosumab/Xgeva to start 04/17/2020-- repeat every 12 weeks   (a) take TUMS 3 tabs on day of Xgeva dose   PLAN:  Mr. Laura is taking Lenalidomide 3 weeks on and one week off for his multiple myeloma.  He is tolerating this well.  He is due to restart this next week.  His m spike has remained quite low.  Hutch continues to have hypokalemia.  He goes on and off the potassium, however, at this point, he likely just needs to stay on a daily dose of  potassium.  I will send this in for him.    I recommended that he continue with his regular activity level.    [facility] will return for his f/u in 4 weeks.  He knows to call us if any other symptoms develop or if his current symptoms worsen.    Total encounter time 20 minutes*  Wilber Bihari, NP 10/30/20 12:22 PM Medical Oncology and Hematology [facility] [place], [place] [address] Tel. [phone]    Fax. [phone]    *Total Encounter Time as defined by the Centers for Medicare and Medicaid Services includes, in addition to the face-to-face time of a patient visit (documented in the note above) non-face-to-face time: obtaining and reviewing outside history, ordering and reviewing medications, tests or procedures, care coordination (communications with other health care professionals or caregivers) and documentation in the medical record.

## 2020-10-31 ENCOUNTER — Other Ambulatory Visit: Payer: Self-pay | Admitting: Oncology

## 2020-10-31 DIAGNOSIS — C9000 Multiple myeloma not having achieved remission: Secondary | ICD-10-CM

## 2020-10-31 DIAGNOSIS — D472 Monoclonal gammopathy: Secondary | ICD-10-CM

## 2020-10-31 LAB — KAPPA/LAMBDA LIGHT CHAINS
Kappa free light chain: 48.6 mg/L — ABNORMAL HIGH (ref 3.3–19.4)
Kappa, lambda light chain ratio: 2.72 — ABNORMAL HIGH (ref 0.26–1.65)
Lambda free light chains: 17.9 mg/L (ref 5.7–26.3)

## 2020-10-31 MED ORDER — LENALIDOMIDE 10 MG PO CAPS: 10.0000 mg | ORAL_CAPSULE | Freq: Every day | ORAL | 0 refills | Status: DC

## 2020-11-01 ENCOUNTER — Telehealth: Payer: Self-pay | Admitting: Adult Health

## 2020-11-01 ENCOUNTER — Telehealth: Payer: Self-pay | Admitting: *Deleted

## 2020-11-01 LAB — MULTIPLE MYELOMA PANEL, SERUM
Albumin SerPl Elph-Mcnc: 3.5 g/dL (ref 2.9–4.4)
Albumin/Glob SerPl: 1.1 (ref 0.7–1.7)
Alpha 1: 0.3 g/dL (ref 0.0–0.4)
Alpha2 Glob SerPl Elph-Mcnc: 0.7 g/dL (ref 0.4–1.0)
B-Globulin SerPl Elph-Mcnc: 0.9 g/dL (ref 0.7–1.3)
Gamma Glob SerPl Elph-Mcnc: 1.3 g/dL (ref 0.4–1.8)
Globulin, Total: 3.2 g/dL (ref 2.2–3.9)
IgA: 109 mg/dL (ref 61–437)
IgG (Immunoglobin G), Serum: 1262 mg/dL (ref 603–1613)
IgM (Immunoglobulin M), Srm: 32 mg/dL (ref 15–143)
M Protein SerPl Elph-Mcnc: 0.3 g/dL — ABNORMAL HIGH
Total Protein ELP: 6.7 g/dL (ref 6.0–8.5)

## 2020-11-01 MED ORDER — METHYLPREDNISOLONE 4 MG PO TBPK: ORAL_TABLET | ORAL | 0 refills | Status: DC

## 2020-11-01 NOTE — Telephone Encounter (Signed)
No 12/1 los, no changes made to pt schedule

## 2020-11-01 NOTE — Telephone Encounter (Signed)
Pt called to state irritation in toes continuing now progressing to pain in tops of toes.  Note he states he mentioned this to provider at visit here on 10/30/2020.  Per further discussion and inquiry - pt states history of gout and when ask if symptoms were like it- he stated " you know it does feel the same "  Joe [name] does not recall what medication was used in the past when he had gout.  Above review with LCC/NP with recommendation for medrol dose pack for alleviation of symptoms- As well as need to call primary MD on Monday for possible follow up and further interventions due to symptoms likely not related to current therapy.  This RN called and discussed above with pt - verified pharmacy and sent prescription.

## 2020-11-04 ENCOUNTER — Telehealth: Payer: Self-pay

## 2020-11-04 NOTE — Telephone Encounter (Signed)
Contacted patient to verify telephone visit for pre reg °

## 2020-11-05 ENCOUNTER — Inpatient Hospital Stay: Payer: Medicare Other

## 2020-11-05 NOTE — Progress Notes (Signed)
Nutrition  Called patient at scheduled nutrition follow-up phone visit and no answer.  No option to leave voicemail as voicemail not activated.    Damere Brandenburg B. Zenia Resides, [place], [place] Registered Dietitian [phone] (mobile)

## 2020-11-06 ENCOUNTER — Other Ambulatory Visit: Payer: Self-pay | Admitting: Oncology

## 2020-11-06 DIAGNOSIS — C9000 Multiple myeloma not having achieved remission: Secondary | ICD-10-CM

## 2020-11-27 ENCOUNTER — Other Ambulatory Visit: Payer: Self-pay | Admitting: *Deleted

## 2020-11-27 ENCOUNTER — Other Ambulatory Visit: Payer: Self-pay | Admitting: Oncology

## 2020-11-27 DIAGNOSIS — C9000 Multiple myeloma not having achieved remission: Secondary | ICD-10-CM

## 2020-11-27 DIAGNOSIS — D472 Monoclonal gammopathy: Secondary | ICD-10-CM

## 2020-11-27 MED ORDER — LENALIDOMIDE 10 MG PO CAPS: 10.0000 mg | ORAL_CAPSULE | Freq: Every day | ORAL | 0 refills | Status: DC

## 2020-11-28 ENCOUNTER — Other Ambulatory Visit: Payer: Self-pay | Admitting: *Deleted

## 2020-11-28 DIAGNOSIS — C9000 Multiple myeloma not having achieved remission: Secondary | ICD-10-CM

## 2020-11-28 MED ORDER — LENALIDOMIDE 10 MG PO CAPS: 10.0000 mg | ORAL_CAPSULE | Freq: Every day | ORAL | 0 refills | Status: DC

## 2020-11-28 MED ORDER — LENALIDOMIDE 10 MG PO CAPS: ORAL_CAPSULE | ORAL | 0 refills | Status: DC

## 2020-12-03 NOTE — Progress Notes (Signed)
Joe [name]  [phone] [phone]     ID: Joe [name] DOB: Apr 28, 1929  MR#: [id number]  SJG#:[id number]  Patient Care Team: Joe Gravel, MD as PCP - General (Internal Medicine) Joe [name], Joe Dad, MD as Consulting Physician (Oncology) Dr. Iantha [name], D.D.S., PA Joe Cruel, MD OTHER MD:  CHIEF COMPLAINT: multiple myeloma  CURRENT TREATMENT: denosumab Delton See; lenalidomide   INTERVAL HISTORY: Joe [name] returns today for follow up and treatment of his multiple myeloma.  He is now taking Lenalidomide, 18m daily 21 days on and 7 day off.   We are following chiefly his M spike Results for Joe [name], Joe [name](MRN [id number] as of 12/04/2020 14:03  Ref. Range 03/01/2020 18:45 04/17/2020 13:49 05/15/2020 13:29 06/12/2020 13:25 07/03/2020 09:54 08/30/2020 14:03 10/02/2020 08:12 10/30/2020 10:41  M Protein SerPl Elph-Mcnc Latest Ref Range: Not Observed g/dL 3.8 (H) 1.1 (H) 0.9 (H) 0.7 (H) 0.6 (H) 0.5 (H) 0.4 (H) 0.3 (H)   The kappa lambda ratio is also favorable Results for Joe [name](MRN [id number] as of 12/04/2020 14:03  Ref. Range 03/01/2020 18:45 04/17/2020 13:49 05/15/2020 13:29 06/12/2020 13:25 07/03/2020 09:54 08/30/2020 14:02 10/02/2020 08:11 10/30/2020 10:40  Kappa, lamda light chain ratio Latest Ref Range: 0.26 - 1.65  117.05 (H) 7.06 (H) 11.31 (H) 6.22 (H) 9.18 (H) 5.65 (H) 3.94 (H) 2.72 (H)   Finally he receives denosumab/Xgeva every 12 weeks, and most recently received this on 10/02/2020.   REVIEW OF SYSTEMS: Joe [name] tolerating the lenalidomide remarkably well, with no nausea, constipation, or sleepiness.  The problem he is having though is some neuropathy in his feet.  This is particularly noticeable at night time.  They tingle and are uncomfortable.  He has no similar symptoms involving his hands.  A detailed review of systems today was otherwise stable   COVID 19 VACCINATION STATUS: He is received Pfizer x2 with booster October 2021   HISTORY OF  CURRENT ILLNESS: From the original intake note:  Joe [name] an 87y/o GGuyanaman formerly followed by my partner Dr Joe Nordmannfor M-GUS. We have an M-Spike of 0.87 documented 07/12/2012. [name] kappa/lambda light chains and total IgG were follower yearly until 01/2016, when they were 2.36 and 1517 respectively (unremarkable). He was lost to follow-up after that point. Yesterday [02/29/2020] the patient presented to Urgent Care with poorly controlled pain. This was felt to be mussculoskeletal and he was started on a medrol dose-pak and robaxin. However his pain worsened [facility] but labs showed a creatinine of 1.84 (baseline 1.0), calcium 12.7 with albumin 2.8 and total protein 10.4 (globulin fraction 7.8). CT of the abdomen obtained for evaluation of his abdominal and back pain showed no hydronephrosis but multiple lytic lesons. We were consulted for further evaluation and treatment of likely multiple myeloma and he was admitted to the hospital.   The patient's subsequent history is as detailed below.   PAST MEDICAL HISTORY: Past Medical History:  Diagnosis Date  . Enlarged prostate   . Hypertension     PAST SURGICAL HISTORY: Past Surgical History:  Procedure Laterality Date  . EYE SURGERY    . PROSTATE SURGERY      FAMILY HISTORY Family History  Problem Relation Age of Onset  . Hypertension Mother     SOCIAL HISTORY:  Retired, used to work for the [employer] [place] 4 children, [place]  one in [place]; 5 grandchildren and 5 great-grandchildren. Attands a [employer].    ADVANCED DIRECTIVES: at the initial visit the patient confirmed his wife is his HCPOA (despite her memory issues).   HEALTH MAINTENANCE: Social History   Tobacco Use  . Smoking status: Never Smoker  . Smokeless tobacco: Never Used  Vaping Use  .  Vaping Use: Never used  Substance Use Topics  . Alcohol use: Never  . Drug use: Never     Colonoscopy: 6-8 years ago     No Known Allergies  Current Outpatient Medications  Medication Sig Dispense Refill  . acetaminophen (TYLENOL) 500 MG tablet Take 1,000 mg by mouth every 6 (six) hours as needed for mild pain.    [name] acyclovir (ZOVIRAX) 400 MG tablet Take 1 tablet by mouth twice daily 60 tablet 0  . amLODipine (NORVASC) 5 MG tablet Take 5 mg by mouth daily.    [name] aspirin EC 81 MG tablet Take 81 mg by mouth daily. Swallow whole.     . calcium carbonate (TUMS - DOSED IN MG ELEMENTAL CALCIUM) 500 MG chewable tablet Chew 3 tablets by mouth daily.    [name] doxazosin (CARDURA) 4 MG tablet Take 4 mg by mouth at bedtime.    . famotidine (PEPCID) 20 MG tablet Take 1 tablet by mouth once daily 90 tablet 0  . finasteride (PROSCAR) 5 MG tablet Take 1 tablet by mouth once daily 30 tablet 0  . indapamide (LOZOL) 2.5 MG tablet Take 2.5 mg by mouth daily.     [name] lenalidomide (REVLIMID) 10 MG capsule Take 1 tablet daily for 21 days then 7 days off every 28 days  Adult male [name]  [id number]    Date Obtained 12/29//2021 21 capsule 6  . magnesium citrate SOLN Take 1/2 bottle if no bowel movement in 2 hours repeat 195 mL 1  . methylPREDNISolone (MEDROL DOSEPAK) 4 MG TBPK tablet Take as directed 21 tablet 0  . ondansetron (ZOFRAN) 8 MG tablet TAKE 1 TABLET BY MOUTH THREE TIMES DAILY AS NEEDED FOR NAUSEA FOR VOMITING 20 tablet 0  . polyethylene glycol powder (MIRALAX) 17 GM/SCOOP powder Take 255 g by mouth daily. 255 g 0  . potassium chloride (KLOR-CON) 10 MEQ tablet Take 2 tablets (20 mEq total) by mouth daily. 180 tablet 2  . pravastatin (PRAVACHOL) 40 MG tablet Take 40 mg by mouth daily.    . prochlorperazine (COMPAZINE) 10 MG tablet Take 1 tablet (10 mg total) by mouth every 6 (six) hours as needed (Nausea or vomiting). 30 tablet 1  . senna-docusate (SENOKOT-S) 8.6-50 MG tablet Take 2 tablets by mouth 2  (two) times daily. (Patient taking differently: Take 2 tablets by mouth 2 (two) times daily as needed. ) 120 tablet 0  . tamsulosin (FLOMAX) 0.4 MG CAPS capsule Take 1 capsule (0.4 mg total) by mouth daily after supper. 30 capsule 6  . traMADol (ULTRAM) 50 MG tablet Take 1 tablet (50 mg total) by mouth every 8 (eight) hours as needed for severe pain. 60 tablet 0  . vitamin B-12 (CYANOCOBALAMIN) 1000 MCG tablet Take 1,000 mcg by mouth daily.     No current facility-administered medications for this visit.    OBJECTIVE: African-American man in no acute distress Vitals:   12/04/20 1344  BP: (!) 159/59  Pulse: 60  Resp: 18  Temp: 97.7 F (36.5 C)  SpO2: 100%     Body mass index is 26.1 kg/m.   Wt Readings from Last  3 Encounters:  12/04/20 187 lb 1.6 oz (84.9 kg)  10/30/20 186 lb 8 oz (84.6 kg)  10/02/20 186 lb (84.4 kg)  ECOG FS:1 - Symptomatic but completely ambulatory  Sclerae unicteric, EOMs intact Wearing a mask No cervical or supraclavicular adenopathy Lungs no rales or rhonchi Heart regular rate and rhythm Abd soft, nontender, positive bowel sounds MSK no focal spinal tenderness, no upper extremity lymphedema Neuro: nonfocal, well oriented, appropriate affect Breasts: Deferred   LAB RESULTS:  CMP     Component Value Date/Time   NA 140 12/04/2020 1255   NA 140 01/09/2016 1045   K 3.4 (L) 12/04/2020 1255   K 3.8 01/09/2016 1045   CL 103 12/04/2020 1255   CO2 29 12/04/2020 1255   CO2 27 01/09/2016 1045   GLUCOSE 122 (H) 12/04/2020 1255   GLUCOSE 89 01/09/2016 1045   BUN 13 12/04/2020 1255   BUN 14.7 01/09/2016 1045   CREATININE 0.97 12/04/2020 1255   CREATININE 1.0 01/09/2016 1045   CALCIUM 8.6 (L) 12/04/2020 1255   CALCIUM 9.2 01/09/2016 1045   PROT 6.9 12/04/2020 1255   PROT 7.3 01/09/2016 1045   ALBUMIN 3.3 (L) 12/04/2020 1255   ALBUMIN 3.7 01/09/2016 1045   AST 17 12/04/2020 1255   AST 18 01/09/2016 1045   ALT 13 12/04/2020 1255   ALT 15 01/09/2016  1045   ALKPHOS 60 12/04/2020 1255   ALKPHOS 60 01/09/2016 1045   BILITOT 0.8 12/04/2020 1255   BILITOT 0.51 01/09/2016 1045   GFRNONAA >60 12/04/2020 1255   GFRAA >60 08/30/2020 1402    Lab Results  Component Value Date   TOTALPROTELP 6.7 10/30/2020   ALBUMINELP 56.1 07/12/2012   A1GS 3.5 07/12/2012   A2GS 9.8 07/12/2012   BETS 5.1 07/12/2012   BETA2SER 3.6 07/12/2012   GAMS 21.9 (H) 07/12/2012   MSPIKE 0.87 07/12/2012   SPEI * 07/12/2012     Lab Results  Component Value Date   KPAFRELGTCHN 48.6 (H) 10/30/2020   LAMBDASER 17.9 10/30/2020   KAPLAMBRATIO 2.72 (H) 10/30/2020    Lab Results  Component Value Date   WBC 4.4 12/04/2020   NEUTROABS 2.2 12/04/2020   HGB 11.3 (L) 12/04/2020   HCT 36.3 (L) 12/04/2020   MCV 79.8 (L) 12/04/2020   PLT 186 12/04/2020      Chemistry      Component Value Date/Time   NA 140 12/04/2020 1255   NA 140 01/09/2016 1045   K 3.4 (L) 12/04/2020 1255   K 3.8 01/09/2016 1045   CL 103 12/04/2020 1255   CO2 29 12/04/2020 1255   CO2 27 01/09/2016 1045   BUN 13 12/04/2020 1255   BUN 14.7 01/09/2016 1045   CREATININE 0.97 12/04/2020 1255   CREATININE 1.0 01/09/2016 1045      Component Value Date/Time   CALCIUM 8.6 (L) 12/04/2020 1255   CALCIUM 9.2 01/09/2016 1045   ALKPHOS 60 12/04/2020 1255   ALKPHOS 60 01/09/2016 1045   AST 17 12/04/2020 1255   AST 18 01/09/2016 1045   ALT 13 12/04/2020 1255   ALT 15 01/09/2016 1045   BILITOT 0.8 12/04/2020 1255   BILITOT 0.51 01/09/2016 1045       No results found for: LABCA2  No components found for: MEQAST419  No results for input(s): INR in the last 168 hours.  No results found for: LABCA2  No results found for: QQI297  No results found for: LGX211  No results found for: HER740  No results found  for: CA2729  No components found for: HGQUANT  No results found for: CEA1 / No results found for: CEA1   No results found for: AFPTUMOR  No results found for: CHROMOGRNA  No  results found for: HGBA, HGBA2QUANT, HGBFQUANT, HGBSQUAN (Hemoglobinopathy evaluation)   Lab Results  Component Value Date   LDH 136 01/09/2016    Lab Results  Component Value Date   IRON 49 03/01/2020   TIBC 238 (L) 03/01/2020   IRONPCTSAT 21 03/01/2020   (Iron and TIBC)  Lab Results  Component Value Date   FERRITIN 375 (H) 03/01/2020    Urinalysis    Component Value Date/Time   COLORURINE AMBER (A) 03/01/2020 1052   APPEARANCEUR CLOUDY (A) 03/01/2020 1052   LABSPEC 1.021 03/01/2020 1052   PHURINE 5.0 03/01/2020 1052   GLUCOSEU NEGATIVE 03/01/2020 1052   HGBUR NEGATIVE 03/01/2020 1052   BILIRUBINUR NEGATIVE 03/01/2020 1052   KETONESUR NEGATIVE 03/01/2020 1052   PROTEINUR 100 (A) 03/01/2020 1052   UROBILINOGEN 0.2 02/29/2020 1537   NITRITE NEGATIVE 03/01/2020 [place] 03/01/2020 1052    STUDIES: No results found.   ELIGIBLE FOR AVAILABLE RESEARCH PROTOCOL:   ASSESSMENT: 87 y.o.  51 [place] man with a history of M-GUS, presenting 02/29/2020 with a high globulin fraction, worsening renal function, hypercalcemia, anemia and multiple lytic bone lesions.   (1) IgG Kappa Multiple Myeloma: Labs diagnostic on 03/01/2020 with Mspike of 3.8, Kappa free light chain 690.6, and ratio 117.05.  (a) CT A/P on 03/01/2020 shows "innumerable" lytic lesions involving all visualized bones  (b) bone marrow biopsy 03/11/2020 shows 20% plasmacytosis by CD138, 12% by manual differential; molecular studies not requested  (c) Bortezomib and Dexamethasone given weekly 3 weeks on and 1 week off beginning 03/11/2020    (2) lytic bone lesions/ hypercalcemia:  (a) Pamidronate administered on 03/01/2020-- resolved  (b) denosumab/Xgeva to start 04/17/2020-- repeat every 12 weeks   (a) take TUMS 3 tabs on day of Xgeva dose   PLAN:  Joe [name] continues on lenalidomide with an excellent response.  The problem is we are running into peripheral neuropathy issues.  He is very  reluctant at changing medications because he is doing so well on this plan.  I did explain to him that the neuropathy is going to limit what we can do.  For now however we are proceeding with the next cycle.  Options include giving him a treatment break, dropping the dose, a combining those 2, or changing to a different medication.  I am going to see him again 12/27/2019.  We will do Xgeva at that point.  We will make a definitive decision regarding further treatment at that visit.  Total encounter time 25 minutes.Sarajane Jews C. Blasa Raisch, MD 12/04/20 2:06 PM Medical Oncology and Hematology [facility] [place],  [address] Tel. [phone]    Fax. [phone]   I, Wilburn Mylar, am acting as scribe for Dr. Virgie [name]. [place] [employer].  I, Lurline Del MD, have reviewed the above documentation for accuracy and completeness, and I agree with the above.    *Total Encounter Time as defined by the Centers for Medicare and Medicaid Services includes, in addition to the face-to-face time of a patient visit (documented in the note above) non-face-to-face time: obtaining and reviewing outside history, ordering and reviewing medications, tests or procedures, care coordination (communications with other health care professionals or caregivers) and documentation in the medical record.

## 2020-12-04 ENCOUNTER — Telehealth: Payer: Self-pay | Admitting: Oncology

## 2020-12-04 ENCOUNTER — Inpatient Hospital Stay: Payer: Medicare Other | Attending: Adult Health | Admitting: Oncology

## 2020-12-04 ENCOUNTER — Inpatient Hospital Stay: Payer: Medicare Other

## 2020-12-04 ENCOUNTER — Other Ambulatory Visit: Payer: Self-pay

## 2020-12-04 VITALS — BP 159/59 | HR 60 | Temp 97.7°F | Resp 18 | Ht 71.0 in | Wt 187.1 lb

## 2020-12-04 DIAGNOSIS — C9000 Multiple myeloma not having achieved remission: Secondary | ICD-10-CM

## 2020-12-04 DIAGNOSIS — G629 Polyneuropathy, unspecified: Secondary | ICD-10-CM | POA: Insufficient documentation

## 2020-12-04 DIAGNOSIS — N179 Acute kidney failure, unspecified: Secondary | ICD-10-CM

## 2020-12-04 DIAGNOSIS — D649 Anemia, unspecified: Secondary | ICD-10-CM | POA: Diagnosis not present

## 2020-12-04 DIAGNOSIS — Z7189 Other specified counseling: Secondary | ICD-10-CM | POA: Diagnosis not present

## 2020-12-04 LAB — CBC WITH DIFFERENTIAL (CANCER CENTER ONLY)
Abs Immature Granulocytes: 0.01 10*3/uL (ref 0.00–0.07)
Basophils Absolute: 0.1 10*3/uL (ref 0.0–0.1)
Basophils Relative: 1 %
Eosinophils Absolute: 0.1 10*3/uL (ref 0.0–0.5)
Eosinophils Relative: 3 %
HCT: 36.3 % — ABNORMAL LOW (ref 39.0–52.0)
Hemoglobin: 11.3 g/dL — ABNORMAL LOW (ref 13.0–17.0)
Immature Granulocytes: 0 %
Lymphocytes Relative: 32 %
Lymphs Abs: 1.4 10*3/uL (ref 0.7–4.0)
MCH: 24.8 pg — ABNORMAL LOW (ref 26.0–34.0)
MCHC: 31.1 g/dL (ref 30.0–36.0)
MCV: 79.8 fL — ABNORMAL LOW (ref 80.0–100.0)
Monocytes Absolute: 0.6 10*3/uL (ref 0.1–1.0)
Monocytes Relative: 13 %
Neutro Abs: 2.2 10*3/uL (ref 1.7–7.7)
Neutrophils Relative %: 51 %
Platelet Count: 186 10*3/uL (ref 150–400)
RBC: 4.55 MIL/uL (ref 4.22–5.81)
RDW: 15 % (ref 11.5–15.5)
WBC Count: 4.4 10*3/uL (ref 4.0–10.5)
nRBC: 0 % (ref 0.0–0.2)

## 2020-12-04 LAB — CMP (CANCER CENTER ONLY)
ALT: 13 U/L (ref 0–44)
AST: 17 U/L (ref 15–41)
Albumin: 3.3 g/dL — ABNORMAL LOW (ref 3.5–5.0)
Alkaline Phosphatase: 60 U/L (ref 38–126)
Anion gap: 8 (ref 5–15)
BUN: 13 mg/dL (ref 8–23)
CO2: 29 mmol/L (ref 22–32)
Calcium: 8.6 mg/dL — ABNORMAL LOW (ref 8.9–10.3)
Chloride: 103 mmol/L (ref 98–111)
Creatinine: 0.97 mg/dL (ref 0.61–1.24)
GFR, Estimated: >60 mL/min (ref >60)
Glucose, Bld: 122 mg/dL — ABNORMAL HIGH (ref 70–99)
Potassium: 3.4 mmol/L — ABNORMAL LOW (ref 3.5–5.1)
Sodium: 140 mmol/L (ref 135–145)
Total Bilirubin: 0.8 mg/dL (ref 0.3–1.2)
Total Protein: 6.9 g/dL (ref 6.5–8.1)

## 2020-12-04 MED ORDER — LENALIDOMIDE 10 MG PO CAPS: ORAL_CAPSULE | ORAL | 6 refills | Status: DC

## 2020-12-04 NOTE — Telephone Encounter (Signed)
Scheduled appts per 1/5 los. Gave pt a print out of AVS.  °

## 2020-12-05 LAB — KAPPA/LAMBDA LIGHT CHAINS
Kappa free light chain: 34.2 mg/L — ABNORMAL HIGH (ref 3.3–19.4)
Kappa, lambda light chain ratio: 2.63 — ABNORMAL HIGH (ref 0.26–1.65)
Lambda free light chains: 13 mg/L (ref 5.7–26.3)

## 2020-12-06 LAB — MULTIPLE MYELOMA PANEL, SERUM
Albumin SerPl Elph-Mcnc: 3.4 g/dL (ref 2.9–4.4)
Albumin/Glob SerPl: 1.2 (ref 0.7–1.7)
Alpha 1: 0.2 g/dL (ref 0.0–0.4)
Alpha2 Glob SerPl Elph-Mcnc: 0.6 g/dL (ref 0.4–1.0)
B-Globulin SerPl Elph-Mcnc: 0.9 g/dL (ref 0.7–1.3)
Gamma Glob SerPl Elph-Mcnc: 1.2 g/dL (ref 0.4–1.8)
Globulin, Total: 2.9 g/dL (ref 2.2–3.9)
IgA: 120 mg/dL (ref 61–437)
IgG (Immunoglobin G), Serum: 1224 mg/dL (ref 603–1613)
IgM (Immunoglobulin M), Srm: 25 mg/dL (ref 15–143)
Total Protein ELP: 6.3 g/dL (ref 6.0–8.5)

## 2020-12-11 ENCOUNTER — Other Ambulatory Visit: Payer: Self-pay | Admitting: Oncology

## 2020-12-11 DIAGNOSIS — C9000 Multiple myeloma not having achieved remission: Secondary | ICD-10-CM

## 2020-12-16 ENCOUNTER — Other Ambulatory Visit: Payer: Self-pay | Admitting: Adult Health

## 2020-12-16 DIAGNOSIS — C9000 Multiple myeloma not having achieved remission: Secondary | ICD-10-CM

## 2020-12-16 NOTE — Progress Notes (Signed)
Referral placed for evusheld per Dr. Magrinat 

## 2020-12-23 ENCOUNTER — Other Ambulatory Visit: Payer: Self-pay

## 2020-12-23 ENCOUNTER — Inpatient Hospital Stay: Payer: Medicare Other

## 2020-12-23 DIAGNOSIS — C9000 Multiple myeloma not having achieved remission: Secondary | ICD-10-CM

## 2020-12-23 DIAGNOSIS — G629 Polyneuropathy, unspecified: Secondary | ICD-10-CM | POA: Diagnosis not present

## 2020-12-23 LAB — CBC WITH DIFFERENTIAL (CANCER CENTER ONLY)
Abs Immature Granulocytes: 0.04 10*3/uL (ref 0.00–0.07)
Basophils Absolute: 0 10*3/uL (ref 0.0–0.1)
Basophils Relative: 1 %
Eosinophils Absolute: 0.4 10*3/uL (ref 0.0–0.5)
Eosinophils Relative: 10 %
HCT: 39.8 % (ref 39.0–52.0)
Hemoglobin: 12.2 g/dL — ABNORMAL LOW (ref 13.0–17.0)
Immature Granulocytes: 1 %
Lymphocytes Relative: 27 %
Lymphs Abs: 1.2 10*3/uL (ref 0.7–4.0)
MCH: 24.7 pg — ABNORMAL LOW (ref 26.0–34.0)
MCHC: 30.7 g/dL (ref 30.0–36.0)
MCV: 80.6 fL (ref 80.0–100.0)
Monocytes Absolute: 0.6 10*3/uL (ref 0.1–1.0)
Monocytes Relative: 15 %
Neutro Abs: 2 10*3/uL (ref 1.7–7.7)
Neutrophils Relative %: 46 %
Platelet Count: 139 10*3/uL — ABNORMAL LOW (ref 150–400)
RBC: 4.94 MIL/uL (ref 4.22–5.81)
RDW: 14.5 % (ref 11.5–15.5)
WBC Count: 4.3 10*3/uL (ref 4.0–10.5)
nRBC: 0 % (ref 0.0–0.2)

## 2020-12-23 LAB — CMP (CANCER CENTER ONLY)
ALT: 12 U/L (ref 0–44)
AST: 17 U/L (ref 15–41)
Albumin: 3.5 g/dL (ref 3.5–5.0)
Alkaline Phosphatase: 60 U/L (ref 38–126)
Anion gap: 6 (ref 5–15)
BUN: 13 mg/dL (ref 8–23)
CO2: 31 mmol/L (ref 22–32)
Calcium: 9.2 mg/dL (ref 8.9–10.3)
Chloride: 104 mmol/L (ref 98–111)
Creatinine: 1.03 mg/dL (ref 0.61–1.24)
GFR, Estimated: >60 mL/min (ref >60)
Glucose, Bld: 104 mg/dL — ABNORMAL HIGH (ref 70–99)
Potassium: 3.5 mmol/L (ref 3.5–5.1)
Sodium: 141 mmol/L (ref 135–145)
Total Bilirubin: 0.7 mg/dL (ref 0.3–1.2)
Total Protein: 7 g/dL (ref 6.5–8.1)

## 2020-12-25 NOTE — Progress Notes (Signed)
[place]  [phone]) [phone] [phone]) [phone]     ID: Joe [name] DOB: [birth year]  MR#: [id number]  WUX#:[id number]  Patient Care Team: Jani Gravel, MD as PCP - General (Internal Medicine) Jameca Chumley, Virgie Dad, MD as Consulting Physician (Oncology) Dr. Iantha Fallen, D.D.S., PA Chauncey Cruel, MD OTHER MD:  CHIEF COMPLAINT: multiple myeloma  CURRENT TREATMENT: denosumab Xgeva every 12 weeks; lenalidomide   INTERVAL HISTORY: [name] returns today for follow up and treatment of his multiple myeloma.  He is now taking Lenalidomide, 13m daily 21 days on and 7 day off.  He tolerates this remarkably well, with minimal constipation, no fatigue or nausea, and no sleepiness.  We are following chiefly his M spike; as of 12/04/2020 it was "not observed".  The kappa lambda ratio is also favorable, down to 2.63  Finally he receives denosumab/Xgeva every 12 weeks, and most recently received this on 10/02/2020.  Dose is due today.  He has no side effects from these medications that he is aware of.   REVIEW OF SYSTEMS: LYeirentells me he does have some peripheral neuropathy affecting his feet.  He chiefly notices this at night when he is lying in bed.  Sometimes he rubs his feet with a little alcohol and that helps.  He does have a good sense of balance and there have been no intercurrent falls.  A detailed review of systems today was otherwise stable   COVID 19 VACCINATION STATUS: He is received Pfizer x2 with booster October 2021   HISTORY OF CURRENT ILLNESS: From the original intake note:  Mr. Joe [name] an 87y/o GGuyanaman formerly followed by my partner Dr MJulien Nordmannfor M-GUS. We have an M-Spike of 0.87 documented 07/12/2012. [name] kappa/lambda light chains and total IgG were follower yearly until 01/2016, when they were 2.36 and 1517 respectively (unremarkable). He was lost to follow-up after that point. Yesterday [02/29/2020] the patient presented to  Urgent Care with poorly controlled pain. This was felt to be mussculoskeletal and he was started on a medrol dose-pak and robaxin. However his pain worsened [facility] but labs showed a creatinine of 1.84 (baseline 1.0), calcium 12.7 with albumin 2.8 and total protein 10.4 (globulin fraction 7.8). CT of the abdomen obtained for evaluation of his abdominal and back pain showed no hydronephrosis but multiple lytic lesons. We were consulted for further evaluation and treatment of likely multiple myeloma and he was admitted to the hospital.   The patient's subsequent history is as detailed below.   PAST MEDICAL HISTORY: Past Medical History:  Diagnosis Date  . Enlarged prostate   . Hypertension     PAST SURGICAL HISTORY: Past Surgical History:  Procedure Laterality Date  . EYE SURGERY    . PROSTATE SURGERY      FAMILY HISTORY Family History  Problem Relation Age of Onset  . Hypertension Mother     SOCIAL HISTORY:  Retired, used to work for the [employer] [place] 4 children, [place] 5 grandchildren and 5 great-grandchildren. Attands a l[employer]    ADVANCED DIRECTIVES: at the initial visit the patient confirmed his wife is his HCPOA (despite her memory issues).   HEALTH MAINTENANCE: Social History   Tobacco Use  . Smoking status: Never Smoker  . Smokeless tobacco: Never Used  Vaping Use  . Vaping Use: Never  used  Substance Use Topics  . Alcohol use: Never  . Drug use: Never     Colonoscopy: 6-8 years ago     No Known Allergies  Current Outpatient Medications  Medication Sig Dispense Refill  . gabapentin (NEURONTIN) 100 MG capsule Take 1 capsule (100 mg total) by mouth at bedtime. 90 capsule 4  . acetaminophen (TYLENOL) 500 MG tablet Take 1,000 mg by mouth every 6 (six) hours as needed for mild pain.     [name] acyclovir (ZOVIRAX) 400 MG tablet Take 1 tablet by mouth twice daily 60 tablet 0  . amLODipine (NORVASC) 5 MG tablet Take 5 mg by mouth daily.    [name] aspirin EC 81 MG tablet Take 81 mg by mouth daily. Swallow whole.     . calcium carbonate (TUMS - DOSED IN MG ELEMENTAL CALCIUM) 500 MG chewable tablet Chew 3 tablets by mouth daily.    [name] doxazosin (CARDURA) 4 MG tablet Take 4 mg by mouth at bedtime.    . famotidine (PEPCID) 20 MG tablet Take 1 tablet by mouth once daily 90 tablet 0  . finasteride (PROSCAR) 5 MG tablet Take 1 tablet by mouth once daily 30 tablet 0  . indapamide (LOZOL) 2.5 MG tablet Take 2.5 mg by mouth daily.     [name] lenalidomide (REVLIMID) 10 MG capsule Take 1 tablet daily for 21 days then 7 days off every 28 days  Adult male Joe [name]  [id number]    Date Obtained 12/29//2021 21 capsule 6  . magnesium citrate SOLN Take 1/2 bottle if no bowel movement in 2 hours repeat 195 mL 1  . methylPREDNISolone (MEDROL DOSEPAK) 4 MG TBPK tablet Take as directed 21 tablet 0  . ondansetron (ZOFRAN) 8 MG tablet TAKE 1 TABLET BY MOUTH THREE TIMES DAILY AS NEEDED FOR NAUSEA FOR VOMITING 20 tablet 0  . polyethylene glycol powder (MIRALAX) 17 GM/SCOOP powder Take 255 g by mouth daily. 255 g 0  . potassium chloride (KLOR-CON) 10 MEQ tablet Take 2 tablets (20 mEq total) by mouth daily. 180 tablet 2  . pravastatin (PRAVACHOL) 40 MG tablet Take 40 mg by mouth daily.    . prochlorperazine (COMPAZINE) 10 MG tablet Take 1 tablet (10 mg total) by mouth every 6 (six) hours as needed (Nausea or vomiting). 30 tablet 1  . senna-docusate (SENOKOT-S) 8.6-50 MG tablet Take 2 tablets by mouth 2 (two) times daily. (Patient taking differently: Take 2 tablets by mouth 2 (two) times daily as needed. ) 120 tablet 0  . tamsulosin (FLOMAX) 0.4 MG CAPS capsule Take 1 capsule (0.4 mg total) by mouth daily after supper. 30 capsule 6  . traMADol (ULTRAM) 50 MG tablet Take 1 tablet (50 mg total) by mouth every 8 (eight) hours  as needed for severe pain. 60 tablet 0  . vitamin B-12 (CYANOCOBALAMIN) 1000 MCG tablet Take 1,000 mcg by mouth daily.     No current facility-administered medications for this visit.    OBJECTIVE: African-American man in no acute distress Vitals:   12/26/20 0924  BP: (!) 142/70  Pulse: 68  Resp: 18  Temp: (!) 96.6 F (35.9 C)  SpO2: 100%     Body mass index is 25.94 kg/m.   Wt Readings from Last 3 Encounters:  12/26/20 186 lb (84.4 kg)  12/04/20 187 lb 1.6 oz (84.9 kg)  10/30/20 186 lb 8 oz (84.6 kg)  ECOG FS:1 - Symptomatic but completely ambulatory  Sclerae unicteric, EOMs intact Wearing a mask No cervical or  supraclavicular adenopathy Lungs no rales or rhonchi Heart regular rate and rhythm Abd soft, nontender, positive bowel sounds MSK no focal spinal tenderness, no upper extremity lymphedema Neuro: nonfocal, well oriented, appropriate affect  LAB RESULTS:  CMP     Component Value Date/Time   NA 141 12/23/2020 1015   NA 140 01/09/2016 1045   K 3.5 12/23/2020 1015   K 3.8 01/09/2016 1045   CL 104 12/23/2020 1015   CO2 31 12/23/2020 1015   CO2 27 01/09/2016 1045   GLUCOSE 104 (H) 12/23/2020 1015   GLUCOSE 89 01/09/2016 1045   BUN 13 12/23/2020 1015   BUN 14.7 01/09/2016 1045   CREATININE 1.03 12/23/2020 1015   CREATININE 1.0 01/09/2016 1045   CALCIUM 9.2 12/23/2020 1015   CALCIUM 9.2 01/09/2016 1045   PROT 7.0 12/23/2020 1015   PROT 7.3 01/09/2016 1045   ALBUMIN 3.5 12/23/2020 1015   ALBUMIN 3.7 01/09/2016 1045   AST 17 12/23/2020 1015   AST 18 01/09/2016 1045   ALT 12 12/23/2020 1015   ALT 15 01/09/2016 1045   ALKPHOS 60 12/23/2020 1015   ALKPHOS 60 01/09/2016 1045   BILITOT 0.7 12/23/2020 1015   BILITOT 0.51 01/09/2016 1045   GFRNONAA >60 12/23/2020 1015   GFRAA >60 08/30/2020 1402    Lab Results  Component Value Date   TOTALPROTELP 6.3 12/04/2020   ALBUMINELP 56.1 07/12/2012   A1GS 3.5 07/12/2012   A2GS 9.8 07/12/2012   BETS 5.1  07/12/2012   BETA2SER 3.6 07/12/2012   GAMS 21.9 (H) 07/12/2012   MSPIKE 0.87 07/12/2012   SPEI * 07/12/2012     Lab Results  Component Value Date   KPAFRELGTCHN 34.2 (H) 12/04/2020   LAMBDASER 13.0 12/04/2020   KAPLAMBRATIO 2.63 (H) 12/04/2020    Lab Results  Component Value Date   WBC 4.3 12/23/2020   NEUTROABS 2.0 12/23/2020   HGB 12.2 (L) 12/23/2020   HCT 39.8 12/23/2020   MCV 80.6 12/23/2020   PLT 139 (L) 12/23/2020      Chemistry      Component Value Date/Time   NA 141 12/23/2020 1015   NA 140 01/09/2016 1045   K 3.5 12/23/2020 1015   K 3.8 01/09/2016 1045   CL 104 12/23/2020 1015   CO2 31 12/23/2020 1015   CO2 27 01/09/2016 1045   BUN 13 12/23/2020 1015   BUN 14.7 01/09/2016 1045   CREATININE 1.03 12/23/2020 1015   CREATININE 1.0 01/09/2016 1045      Component Value Date/Time   CALCIUM 9.2 12/23/2020 1015   CALCIUM 9.2 01/09/2016 1045   ALKPHOS 60 12/23/2020 1015   ALKPHOS 60 01/09/2016 1045   AST 17 12/23/2020 1015   AST 18 01/09/2016 1045   ALT 12 12/23/2020 1015   ALT 15 01/09/2016 1045   BILITOT 0.7 12/23/2020 1015   BILITOT 0.51 01/09/2016 1045       No results found for: LABCA2  No components found for: ULAGTX646  No results for input(s): INR in the last 168 hours.  No results found for: LABCA2  No results found for: OEH212  No results found for: YQM250  No results found for: IBB048  No results found for: CA2729  No components found for: HGQUANT  No results found for: CEA1 / No results found for: CEA1   No results found for: AFPTUMOR  No results found for: CHROMOGRNA  No results found for: HGBA, HGBA2QUANT, HGBFQUANT, HGBSQUAN (Hemoglobinopathy evaluation)   Lab Results  Component Value Date  LDH 136 01/09/2016    Lab Results  Component Value Date   IRON 49 03/01/2020   TIBC 238 (L) 03/01/2020   IRONPCTSAT 21 03/01/2020   (Iron and TIBC)  Lab Results  Component Value Date   FERRITIN 375 (H) 03/01/2020     Urinalysis    Component Value Date/Time   COLORURINE AMBER (A) 03/01/2020 1052   APPEARANCEUR CLOUDY (A) 03/01/2020 1052   LABSPEC 1.021 03/01/2020 1052   PHURINE 5.0 03/01/2020 1052   GLUCOSEU NEGATIVE 03/01/2020 1052   HGBUR NEGATIVE 03/01/2020 1052   BILIRUBINUR NEGATIVE 03/01/2020 1052   KETONESUR NEGATIVE 03/01/2020 1052   PROTEINUR 100 (A) 03/01/2020 1052   UROBILINOGEN 0.2 02/29/2020 1537   NITRITE NEGATIVE 03/01/2020 1052   LEUKOCYTESUR NEGATIVE 03/01/2020 1052    STUDIES: No results found.   ELIGIBLE FOR AVAILABLE RESEARCH PROTOCOL:   ASSESSMENT: 87 y.o.  [address] [place] man with a history of M-GUS, presenting 02/29/2020 with a high globulin fraction, worsening renal function, hypercalcemia, anemia and multiple lytic bone lesions.   (1) IgG Kappa Multiple Myeloma: Labs diagnostic on 03/01/2020 with Mspike of 3.8, Kappa free light chain 690.6, and ratio 117.05.  (a) CT A/P on 03/01/2020 shows "innumerable" lytic lesions involving all visualized bones  (b) bone marrow biopsy 03/11/2020 shows 20% plasmacytosis by CD138, 12% by manual differential; molecular studies not requested  (c) Bortezomib and Dexamethasone given weekly 3 weeks on and 1 week off beginning 03/11/2020, with good response, last dose 07/24/2020  (d) transitioned to lenalidomide 10 mg daily starting 07/31/2020    (2) lytic bone lesions/ hypercalcemia:  (a) Pamidronate administered on 03/01/2020  (b) denosumab/Xgeva started 04/17/2020-- repeat every 12 weeks   (a) to take TUMS 3 tabs on day of Xgeva dose   PLAN:  Mr. Ferencz' M spike is now not detectable.  His kappa lambda light chain is approaching normal.  This is a very good response.  He is tolerating lenalidomide well except for problems with neuropathy.  This chiefly affects his feet and he chiefly notices at night.  We are starting him on gabapentin 100 mg at bedtime to see if this is helpful.  If he does not become groggy from this and if he does not  get sufficient relief we can certainly increase the dose to 200 or 300 mg at bedtime.  At this point I think we can do lab work every 6 weeks and of course continue the injections every 12 weeks.  I am delighted at how well he is doing.  He will let us know if any other problems develop before the next visit  Total encounter time 25 minutes.Sarajane Jews C. Elianys Conry, MD 12/26/20 9:58 AM Medical Oncology and Hematology [facility] [place],  [address] Tel. [phone]    Fax. [phone]   I, Wilburn Mylar, am acting as scribe for Dr. Virgie Dad. Hillarie Harrigan.  I, Lurline Del MD, have reviewed the above documentation for accuracy and completeness, and I agree with the above.   *Total Encounter Time as defined by the Centers for Medicare and Medicaid Services includes, in addition to the face-to-face time of a patient visit (documented in the note above) non-face-to-face time: obtaining and reviewing outside history, ordering and reviewing medications, tests or procedures, care coordination (communications with other health care professionals or caregivers) and documentation in the medical record.

## 2020-12-26 ENCOUNTER — Other Ambulatory Visit: Payer: Self-pay

## 2020-12-26 ENCOUNTER — Other Ambulatory Visit: Payer: Self-pay | Admitting: Oncology

## 2020-12-26 ENCOUNTER — Inpatient Hospital Stay (HOSPITAL_BASED_OUTPATIENT_CLINIC_OR_DEPARTMENT_OTHER): Payer: Medicare Other | Admitting: Oncology

## 2020-12-26 ENCOUNTER — Inpatient Hospital Stay: Payer: Medicare Other

## 2020-12-26 VITALS — BP 142/70 | HR 68 | Temp 96.6°F | Resp 18 | Ht 71.0 in | Wt 186.0 lb

## 2020-12-26 DIAGNOSIS — C9000 Multiple myeloma not having achieved remission: Secondary | ICD-10-CM

## 2020-12-26 DIAGNOSIS — D649 Anemia, unspecified: Secondary | ICD-10-CM | POA: Diagnosis not present

## 2020-12-26 DIAGNOSIS — N179 Acute kidney failure, unspecified: Secondary | ICD-10-CM

## 2020-12-26 DIAGNOSIS — Z7189 Other specified counseling: Secondary | ICD-10-CM | POA: Diagnosis not present

## 2020-12-26 DIAGNOSIS — G629 Polyneuropathy, unspecified: Secondary | ICD-10-CM | POA: Diagnosis not present

## 2020-12-26 MED ORDER — GABAPENTIN 100 MG PO CAPS: 100.0000 mg | ORAL_CAPSULE | Freq: Every day | ORAL | 4 refills | Status: DC

## 2020-12-26 MED ORDER — LENALIDOMIDE 10 MG PO CAPS: ORAL_CAPSULE | ORAL | 6 refills | Status: DC

## 2020-12-26 MED ORDER — DENOSUMAB 120 MG/1.7ML ~~LOC~~ SOLN
120.0000 mg | Freq: Once | SUBCUTANEOUS | Status: AC
  Administered 2020-12-26: 120 mg via SUBCUTANEOUS

## 2020-12-26 NOTE — Patient Instructions (Signed)
Denosumab injection What is this medicine? DENOSUMAB (den oh sue mab) slows bone breakdown. Prolia is used to treat osteoporosis in women after menopause and in men, and in people who are taking corticosteroids for 6 months or more. Xgeva is used to treat a high calcium level due to cancer and to prevent bone fractures and other bone problems caused by multiple myeloma or cancer bone metastases. Xgeva is also used to treat giant cell tumor of the bone. This medicine may be used for other purposes; ask your health care provider or pharmacist if you have questions. COMMON BRAND NAME(S): Prolia, XGEVA What should I tell my health care provider before I take this medicine? They need to know if you have any of these conditions:  dental disease  having surgery or tooth extraction  infection  kidney disease  low levels of calcium or Vitamin D in the blood  malnutrition  on hemodialysis  skin conditions or sensitivity  thyroid or parathyroid disease  an unusual reaction to denosumab, other medicines, foods, dyes, or preservatives  pregnant or trying to get pregnant  breast-feeding How should I use this medicine? This medicine is for injection under the skin. It is given by a health care professional in a hospital or clinic setting. A special MedGuide will be given to you before each treatment. Be sure to read this information carefully each time. For Prolia, talk to your pediatrician regarding the use of this medicine in children. Special care may be needed. For Xgeva, talk to your pediatrician regarding the use of this medicine in children. While this drug may be prescribed for children as young as 13 years for selected conditions, precautions do apply. Overdosage: If you think you have taken too much of this medicine contact a poison control center or emergency room at once. NOTE: This medicine is only for you. Do not share this medicine with others. What if I miss a dose? It is  important not to miss your dose. Call your doctor or health care professional if you are unable to keep an appointment. What may interact with this medicine? Do not take this medicine with any of the following medications:  other medicines containing denosumab This medicine may also interact with the following medications:  medicines that lower your chance of fighting infection  steroid medicines like prednisone or cortisone This list may not describe all possible interactions. Give your health care provider a list of all the medicines, herbs, non-prescription drugs, or dietary supplements you use. Also tell them if you smoke, drink alcohol, or use illegal drugs. Some items may interact with your medicine. What should I watch for while using this medicine? Visit your doctor or health care professional for regular checks on your progress. Your doctor or health care professional may order blood tests and other tests to see how you are doing. Call your doctor or health care professional for advice if you get a fever, chills or sore throat, or other symptoms of a cold or flu. Do not treat yourself. This drug may decrease your body's ability to fight infection. Try to avoid being around people who are sick. You should make sure you get enough calcium and vitamin D while you are taking this medicine, unless your doctor tells you not to. Discuss the foods you eat and the vitamins you take with your health care professional. See your dentist regularly. Brush and floss your teeth as directed. Before you have any dental work done, tell your dentist you are   receiving this medicine. Do not become pregnant while taking this medicine or for 5 months after stopping it. Talk with your doctor or health care professional about your birth control options while taking this medicine. Women should inform their doctor if they wish to become pregnant or think they might be pregnant. There is a potential for serious side  effects to an unborn child. Talk to your health care professional or pharmacist for more information. What side effects may I notice from receiving this medicine? Side effects that you should report to your doctor or health care professional as soon as possible:  allergic reactions like skin rash, itching or hives, swelling of the face, lips, or tongue  bone pain  breathing problems  dizziness  jaw pain, especially after dental work  redness, blistering, peeling of the skin  signs and symptoms of infection like fever or chills; cough; sore throat; pain or trouble passing urine  signs of low calcium like fast heartbeat, muscle cramps or muscle pain; pain, tingling, numbness in the hands or feet; seizures  unusual bleeding or bruising  unusually weak or tired Side effects that usually do not require medical attention (report to your doctor or health care professional if they continue or are bothersome):  constipation  diarrhea  headache  joint pain  loss of appetite  muscle pain  runny nose  tiredness  upset stomach This list may not describe all possible side effects. Call your doctor for medical advice about side effects. You may report side effects to FDA at 1-800-FDA-1088. Where should I keep my medicine? This medicine is only given in a clinic, doctor's office, or other health care setting and will not be stored at home. NOTE: This sheet is a summary. It may not cover all possible information. If you have questions about this medicine, talk to your doctor, pharmacist, or health care provider.  2021 Elsevier/Gold Standard (2018-03-25 16:10:44)

## 2020-12-27 ENCOUNTER — Other Ambulatory Visit: Payer: Self-pay | Admitting: *Deleted

## 2020-12-27 DIAGNOSIS — C9000 Multiple myeloma not having achieved remission: Secondary | ICD-10-CM

## 2020-12-27 MED ORDER — LENALIDOMIDE 10 MG PO CAPS: ORAL_CAPSULE | ORAL | 6 refills | Status: DC

## 2020-12-30 ENCOUNTER — Other Ambulatory Visit: Payer: Self-pay | Admitting: Oncology

## 2020-12-30 ENCOUNTER — Telehealth: Payer: Self-pay | Admitting: Oncology

## 2020-12-30 DIAGNOSIS — D472 Monoclonal gammopathy: Secondary | ICD-10-CM

## 2020-12-30 NOTE — Telephone Encounter (Signed)
Scheduled per 1/27 los. Called and spoke with pt, confirmed added appts

## 2020-12-31 ENCOUNTER — Other Ambulatory Visit (HOSPITAL_COMMUNITY): Payer: Self-pay | Admitting: Adult Health

## 2020-12-31 DIAGNOSIS — C9000 Multiple myeloma not having achieved remission: Secondary | ICD-10-CM

## 2020-12-31 NOTE — Progress Notes (Signed)
I connected by phone with Joe [name] on 12/31/2020, 6:54 PM to discuss the potential use of a new treatment for mild to moderate COVID-19 viral infection in non-hospitalized patients.  This patient is a 87 y.o. male that meets the FDA criteria for Emergency Use Authorization of tixagevimab/cilgavimab for pre-exposure prophylaxis of COVID-19 disease. Pt meets following criteria:  Age >12 yr and weight > 40kg  Not currently infected with SARS-CoV-2 and has no known recent exposure to an individual infected with SARS-CoV-2 AND o Who has moderate to severe immune compromise due to a medical condition or receipt of immunosuppressive medications or treatments and may not mount an adequate immune response to COVID-19 vaccination or  o Vaccination with any available COVID-19 vaccine, according to the approved or authorized schedule, is not recommended due to a history of severe adverse reaction (e.g., severe allergic reaction) to a COVID-19 vaccine(s) and/or COVID-19 vaccine component(s).  o Patient meets the following definition of mod-severe immune compromised status: 4. Lung transplants, other solid organ transplant recipients on continual belatacept therapy, or multiple myeloma (actively receiving treatment)  I have spoken and communicated the following to the patient or parent/caregiver regarding COVID monoclonal antibody treatment:  1. FDA has authorized the emergency use of tixagevimab/cilgavimab for the pre-exposure prophylaxis of COVID-19 in patients with moderate-severe immunocompromised status, who meet above EUA criteria.  2. The significant known and potential risks and benefits of COVID monoclonal antibody, and the extent to which such potential risks and benefits are unknown.  3. Information on available alternative treatments and the risks and benefits of those alternatives, including clinical trials.  4. The patient or parent/caregiver has the option to accept or refuse COVID monoclonal  antibody treatment.  After reviewing this information with the patient, agree to receive tixagevimab/cilgavimab  Scot Dock, NP, 12/31/2020, 6:54 PM

## 2021-01-01 ENCOUNTER — Telehealth: Payer: Self-pay

## 2021-01-01 NOTE — Telephone Encounter (Signed)
Nutrition Follow-up:  Patient with multiple myeloma. Patient receiving denosumab/xgeva q 12 weeks and lenalidomide.    Called and spoke with patient for nutrition follow-up.  Patient reports that his appetite is good and he is eating well.      Medications: reviewed  Labs: reviewed  Anthropometrics:   Weight stable at 186 lb   NUTRITION DIAGNOSIS: Inadequate oral intake improved and weight is stable   INTERVENTION:  Encouraged patient to continue eating well balanced diet. No nutrition questions or concerns from patient.  Patient to reach out to RD if needed in the future.    NEXT VISIT: no follow-up. RD available if nutritional status changes  Javi Bollman B. Zenia Resides, [place], [place] Registered Dietitian [phone] (mobile)

## 2021-01-04 ENCOUNTER — Other Ambulatory Visit: Payer: Self-pay | Admitting: Oncology

## 2021-01-11 ENCOUNTER — Other Ambulatory Visit: Payer: Self-pay | Admitting: Oncology

## 2021-01-11 DIAGNOSIS — C9000 Multiple myeloma not having achieved remission: Secondary | ICD-10-CM

## 2021-01-17 ENCOUNTER — Other Ambulatory Visit: Payer: Self-pay | Admitting: Oncology

## 2021-01-20 ENCOUNTER — Other Ambulatory Visit: Payer: Self-pay | Admitting: Oncology

## 2021-01-23 ENCOUNTER — Telehealth: Payer: Self-pay

## 2021-01-23 ENCOUNTER — Other Ambulatory Visit: Payer: Self-pay

## 2021-01-23 DIAGNOSIS — C9000 Multiple myeloma not having achieved remission: Secondary | ICD-10-CM

## 2021-01-23 MED ORDER — LENALIDOMIDE 10 MG PO CAPS: ORAL_CAPSULE | ORAL | 0 refills | Status: DC

## 2021-01-23 NOTE — Telephone Encounter (Signed)
Patient called to update that he continues to experience numbness/pain in toes.    Patient confirms he is currently taking Gabapentin 100mg  QHS -  Pt denies any side effects since starting medication.   Per MD recommendations patient to increase Gabapentin to 200mg  QHS.   Pt verbalized understanding and agreement.   No further needs at this time.

## 2021-01-24 ENCOUNTER — Telehealth: Payer: Self-pay | Admitting: *Deleted

## 2021-01-24 ENCOUNTER — Other Ambulatory Visit: Payer: Self-pay | Admitting: Oncology

## 2021-01-24 NOTE — Telephone Encounter (Signed)
Mr Belsky states his toe is still hurting, took extra gabapentin last night. Instructed to continue gabapentin. Will take more than one to notice a difference. Discussed taking tramadol to see if that relieves pain. Will call on Monday to let us know how he is doing.

## 2021-01-27 ENCOUNTER — Ambulatory Visit (INDEPENDENT_AMBULATORY_CARE_PROVIDER_SITE_OTHER): Payer: Medicare Other

## 2021-01-27 ENCOUNTER — Ambulatory Visit (HOSPITAL_COMMUNITY)
Admission: EM | Admit: 2021-01-27 | Discharge: 2021-01-27 | Disposition: A | Discharge status: Home / Self Care | Payer: Medicare Other

## 2021-01-27 ENCOUNTER — Other Ambulatory Visit: Payer: Self-pay

## 2021-01-27 ENCOUNTER — Telehealth: Payer: Self-pay

## 2021-01-27 ENCOUNTER — Encounter (HOSPITAL_COMMUNITY): Payer: Self-pay | Admitting: Emergency Medicine

## 2021-01-27 DIAGNOSIS — M109 Gout, unspecified: Secondary | ICD-10-CM | POA: Diagnosis present

## 2021-01-27 DIAGNOSIS — R2243 Localized swelling, mass and lump, lower limb, bilateral: Secondary | ICD-10-CM | POA: Diagnosis not present

## 2021-01-27 DIAGNOSIS — M7989 Other specified soft tissue disorders: Secondary | ICD-10-CM | POA: Diagnosis not present

## 2021-01-27 DIAGNOSIS — M79672 Pain in left foot: Secondary | ICD-10-CM | POA: Diagnosis not present

## 2021-01-27 MED ORDER — PREDNISONE 20 MG PO TABS: 40.0000 mg | ORAL_TABLET | Freq: Once | ORAL | Status: DC

## 2021-01-27 NOTE — Telephone Encounter (Signed)
Left message unable to reach pt x 2

## 2021-01-27 NOTE — ED Triage Notes (Signed)
Pt states that he is having some left foot pain. Pt states that pain is primarily in his big toe and travels down the side of his foot. Pt does have redness and swelling. Pt states that his sx started a week ago.

## 2021-01-27 NOTE — Discharge Instructions (Signed)
Your x-ray was negative for fractures or acute findings.  Take your Tylenol and tramadol as prescribed.  Follow-up with your PCP in 2 to 3 days if symptoms have not improved.  Drink plenty of water.

## 2021-01-27 NOTE — ED Provider Notes (Signed)
Joe [name]    CSN: [id number] Arrival date & time: 01/27/21  1355      History   Chief Complaint Chief Complaint  Patient presents with  . Foot Pain    HPI Joe [name] is a 87 y.o. male.   Patient complaining of left great toe pain and swelling for 1 week, no known injury or insect bite.  Patient is currently undergoing bone tumor treatment, also has prior history for gout.  The history is provided by the patient. No language interpreter was used.    Past Medical History:  Diagnosis Date  . Enlarged prostate   . Hypertension   . Melanoma ([place]) 2021    Patient Active Problem List   Diagnosis Date Noted  . Acute gout involving toe of left foot 01/27/2021  . Multiple myeloma ([place]) 03/02/2020  . Goals of care, counseling/discussion 03/02/2020  . Hypertension   . Hypercalcemia   . AKI (acute kidney injury) ([place])   . Normocytic anemia   . HLD (hyperlipidemia)   . BPH (benign prostatic hyperplasia)   . Elevated total protein   . Hyponatremia   . Back pain     Past Surgical History:  Procedure Laterality Date  . EYE SURGERY    . PROSTATE SURGERY         Home Medications    Prior to Admission medications   Medication Sig Start Date End Date Taking? Authorizing Provider  acetaminophen (TYLENOL) 500 MG tablet Take 1,000 mg by mouth every 6 (six) hours as needed for mild pain.    [provider]  acyclovir (ZOVIRAX) 400 MG tablet Take 1 tablet by mouth twice daily 01/13/21   Magrinat, Virgie Dad, MD  amLODipine (NORVASC) 5 MG tablet Take 5 mg by mouth daily. 01/08/20   [provider]  aspirin EC 81 MG tablet Take 81 mg by mouth daily. Swallow whole.     [provider]  calcium carbonate (TUMS - DOSED IN MG ELEMENTAL CALCIUM) 500 MG chewable tablet Chew 3 tablets by mouth daily.    [provider]  doxazosin (CARDURA) 4 MG tablet Take 4 mg by mouth at bedtime.    [provider]  doxazosin (CARDURA) 8 MG  tablet Take 4 mg by mouth daily. 12/17/20   [provider]  famotidine (PEPCID) 20 MG tablet Take 1 tablet by mouth once daily 01/06/21   Magrinat, Virgie Dad, MD  finasteride (PROSCAR) 5 MG tablet Take 1 tablet by mouth once daily 12/30/20   Magrinat, Virgie Dad, MD  gabapentin (NEURONTIN) 100 MG capsule Take 1 capsule (100 mg total) by mouth at bedtime. 12/26/20   Magrinat, Virgie Dad, MD  indapamide (LOZOL) 2.5 MG tablet Take 2.5 mg by mouth daily.     [provider]  lenalidomide (REVLIMID) 10 MG capsule Take 1 tablet daily for 21 days then 7 days off every 28 days Adult male [place] # obtained 01/23/2021 [id number] 01/23/21   Magrinat, Virgie Dad, MD  magnesium citrate SOLN Take 1/2 bottle if no bowel movement in 2 hours repeat 03/06/20   Magrinat, Virgie Dad, MD  methylPREDNISolone (MEDROL DOSEPAK) 4 MG TBPK tablet Take as directed 11/01/20   Magrinat, Virgie Dad, MD  ondansetron (ZOFRAN) 8 MG tablet TAKE 1 TABLET BY MOUTH THREE TIMES DAILY AS NEEDED FOR NAUSEA OR VOMITING 01/17/21   Magrinat, Virgie Dad, MD  polyethylene glycol powder (MIRALAX) 17 GM/SCOOP powder Take 255 g by mouth daily. 03/11/20   Gardenia Phlegm,  NP  potassium chloride (KLOR-CON) 10 MEQ tablet Take 2 tablets (20 mEq total) by mouth daily. 10/30/20   Gardenia Phlegm, NP  pravastatin (PRAVACHOL) 40 MG tablet Take 40 mg by mouth daily. 01/08/20   [provider]  prochlorperazine (COMPAZINE) 10 MG tablet Take 1 tablet (10 mg total) by mouth every 6 (six) hours as needed (Nausea or vomiting). 03/10/20   Magrinat, Virgie Dad, MD  senna-docusate (SENOKOT-S) 8.6-50 MG tablet Take 2 tablets by mouth 2 (two) times daily. Patient taking differently: Take 2 tablets by mouth 2 (two) times daily as needed.  03/11/20   Gardenia Phlegm, NP  tamsulosin (FLOMAX) 0.4 MG CAPS capsule TAKE 1 CAPSULE BY MOUTH ONCE DAILY AFTER SUPPER 01/21/21   Magrinat, Virgie Dad, MD  traMADol (ULTRAM) 50 MG tablet TAKE 1 TABLET BY MOUTH  EVERY 8 HOURS AS NEEDED. 01/27/21   Magrinat, Virgie Dad, MD  vitamin B-12 (CYANOCOBALAMIN) 1000 MCG tablet Take 1,000 mcg by mouth daily.    [provider]    Family History Family History  Problem Relation Age of Onset  . Hypertension Mother     Social History Social History   Tobacco Use  . Smoking status: Never Smoker  . Smokeless tobacco: Never Used  Vaping Use  . Vaping Use: Never used  Substance Use Topics  . Alcohol use: Never  . Drug use: Never     Allergies   Patient has no known allergies.   Review of Systems Review of Systems  Constitutional: Negative for chills and fever.  HENT: Negative for ear pain and sore throat.   Eyes: Negative for pain and visual disturbance.  Respiratory: Negative for cough and shortness of breath.   Cardiovascular: Negative for chest pain and palpitations.  Gastrointestinal: Negative for abdominal pain and vomiting.  Genitourinary: Negative for dysuria and hematuria.  Musculoskeletal: Positive for arthralgias and joint swelling. Negative for back pain.  Skin: Positive for color change. Negative for rash and wound.  Neurological: Negative for seizures and syncope.  All other systems reviewed and are negative.    Physical Exam Triage Vital Signs ED Triage Vitals  Enc Vitals Group     BP      Pulse      Resp      Temp      Temp src      SpO2      Weight      Height      Head Circumference      Peak Flow      Pain Score      Pain Loc      Pain Edu?      Excl. in [place]?    No data found.  Updated Vital Signs BP (!) 142/53 (BP Location: Right Arm)   Pulse 64   Temp 98.3 F (36.8 C) (Oral)   Resp 18   SpO2 96%   Visual Acuity Right Eye Distance:   Left Eye Distance:   Bilateral Distance:    Right Eye Near:   Left Eye Near:    Bilateral Near:     Physical Exam Vitals and nursing note reviewed.  Constitutional:      Appearance: Normal appearance. He is well-developed, well-groomed and  well-nourished.  HENT:     Head: Normocephalic and atraumatic.  Eyes:     Conjunctiva/sclera: Conjunctivae normal.  Cardiovascular:     Rate and Rhythm: Normal rate and regular rhythm.     Pulses:  Dorsalis pedis pulses are 2+ on the right side.     Heart sounds: No murmur heard.   Pulmonary:     Effort: Pulmonary effort is normal. No respiratory distress.     Breath sounds: Normal breath sounds.  Abdominal:     Palpations: Abdomen is soft.     Tenderness: There is no abdominal tenderness.  Musculoskeletal:        General: No edema.     Cervical back: Neck supple.       Feet:  Skin:    General: Skin is warm and dry.     Capillary Refill: Capillary refill takes less than 2 seconds.  Neurological:     General: No focal deficit present.     Mental Status: He is alert and oriented to person, place, and time.     GCS: GCS eye subscore is 4. GCS verbal subscore is 5. GCS motor subscore is 6.     Cranial Nerves: Cranial nerves are intact.     Sensory: Sensation is intact.     Motor: Motor function is intact.     Coordination: Coordination is intact.     Gait: Gait is intact.  Psychiatric:        Attention and Perception: Attention normal.        Mood and Affect: Mood and affect and mood normal.        Speech: Speech normal.        Behavior: Behavior is cooperative.      UC Treatments / Results  Labs (all labs ordered are listed, but only abnormal results are displayed) Labs Reviewed - No data to display  EKG   Radiology DG Foot Complete Left  Result Date: 01/27/2021 CLINICAL DATA:  Pain and swelling. Pain especially in the big toe. Redness and swelling. Symptoms started 1 week ago. EXAM: LEFT FOOT - COMPLETE 3+ VIEW COMPARISON:  None. FINDINGS: There is diffuse soft tissue swelling of the forefoot. There is no acute fracture or subluxation. No radiopaque foreign body or soft tissue gas. IMPRESSION: Soft tissue swelling. Electronically Signed   By: Nolon Nations M.D.   On: 01/27/2021 15:12    Procedures Procedures (including critical care time)  Medications Ordered in UC Medications - No data to display  Initial Impression / Assessment and Plan / UC Course  I have reviewed the triage vital signs and the nursing notes.  Pertinent labs & imaging results that were available during my care of the patient were reviewed by me and considered in my medical decision making (see chart for details).     DDX: Gout,cellulitis, injury  Pt states he has tramadol at home, Tylenol helps with pain as well.  Advised patient to take either for pain management, low purine diet given.  Patient verbalized understanding to this provider Final Clinical Impressions(s) / UC Diagnoses   Final diagnoses:  Acute gout involving toe of left foot, unspecified cause     Discharge Instructions     Your x-ray was negative for fractures or acute findings.  Take your Tylenol and tramadol as prescribed.  Follow-up with your PCP in 2 to 3 days if symptoms have not improved.  Drink plenty of water.    ED Prescriptions    None     PDMP not reviewed this encounter.   Tori Milks, NP 29/79/89 [phone]

## 2021-01-28 ENCOUNTER — Telehealth (HOSPITAL_COMMUNITY): Payer: Self-pay | Admitting: Family Medicine

## 2021-01-28 ENCOUNTER — Other Ambulatory Visit (HOSPITAL_COMMUNITY): Payer: Self-pay | Admitting: Adult Health

## 2021-01-28 DIAGNOSIS — M109 Gout, unspecified: Secondary | ICD-10-CM

## 2021-01-28 MED ORDER — PREDNISONE 10 MG PO TABS: 20.0000 mg | ORAL_TABLET | Freq: Every day | ORAL | 0 refills | Status: AC

## 2021-01-28 NOTE — Telephone Encounter (Signed)
Sent in steroids for inflammation and pain.

## 2021-01-28 NOTE — Progress Notes (Signed)
Called and reviewed with patient the updated dosing guidance on Evusheld.  The recommended guideline calls for 300mg Tixagevimab/300mg  Cilgavimab.  Patient verbalized understanding and agreed to updated dose.  They will receive this dose on 03/20/2021 after their already scheduled appointment with Dr. Jana Hakim.   Will place new orders once updated order set is ready.  Joe Bihari, NP

## 2021-01-30 ENCOUNTER — Other Ambulatory Visit: Payer: Self-pay | Admitting: Oncology

## 2021-01-30 DIAGNOSIS — D472 Monoclonal gammopathy: Secondary | ICD-10-CM

## 2021-02-03 ENCOUNTER — Other Ambulatory Visit: Payer: Self-pay

## 2021-02-03 ENCOUNTER — Ambulatory Visit (HOSPITAL_COMMUNITY)
Admission: EM | Admit: 2021-02-03 | Discharge: 2021-02-03 | Disposition: A | Discharge status: Home / Self Care | Payer: Medicare Other | Attending: Emergency Medicine | Admitting: Emergency Medicine

## 2021-02-03 ENCOUNTER — Encounter (HOSPITAL_COMMUNITY): Payer: Self-pay

## 2021-02-03 DIAGNOSIS — M109 Gout, unspecified: Secondary | ICD-10-CM | POA: Diagnosis not present

## 2021-02-03 MED ORDER — PREDNISONE 20 MG PO TABS: 40.0000 mg | ORAL_TABLET | Freq: Every day | ORAL | 0 refills | Status: AC

## 2021-02-03 NOTE — Discharge Instructions (Signed)
Drink plenty of water.  Tylenol as needed for breakthrough pain.  5 days of prednisone, may stop taking if symptoms have resolved.  Follow up with your PCP and/or oncologist as scheduled and as needed.  Return if worsening- increased pain, redness, warmth or fevers

## 2021-02-03 NOTE — ED Triage Notes (Signed)
Pt c/o pain to left foot, great toe area. Pt was evaluated and tx for gout of left foot. Pt states he completed his prednisone Rx and foot is approximately 50% improved, but here today for re-eval b/c pain/swelling persists.  Denies fever, altered urine o/p.  Last took tylenol last night.

## 2021-02-03 NOTE — ED Provider Notes (Signed)
[facility]    CSN: [id number] Arrival date & time: 02/03/21  0940      History   Chief Complaint Chief Complaint  Patient presents with  . f/u gout left foot    HPI Joe [name] is a 87 y.o. male.   Joe [name] presents with complaints of persistent left great toe pain. History of gout, states he has had similar episodes in the past. No injury to the toe. He was seen here in [facility] 2/28 with a plain film obtained which demonstrated soft tissue swelling without any other bony abnormality. He was recommended to continue with tylenol and tramadol prn. He states he also took 4 days of prednisone, which did help. His symptoms have improved, but have not resolved. Still with left great toe MTP joint pain and swelling. No fevers. History of multiple myeloma and htn    ROS per HPI, negative if not otherwise mentioned.      Past Medical History:  Diagnosis Date  . Enlarged prostate   . Hypertension   . Melanoma ([place]) 2021    Patient Active Problem List   Diagnosis Date Noted  . Acute gout involving toe of left foot 01/27/2021  . Multiple myeloma ([place]) 03/02/2020  . Goals of care, counseling/discussion 03/02/2020  . Hypertension   . Hypercalcemia   . AKI (acute kidney injury) ([place])   . Normocytic anemia   . HLD (hyperlipidemia)   . BPH (benign prostatic hyperplasia)   . Elevated total protein   . Hyponatremia   . Back pain     Past Surgical History:  Procedure Laterality Date  . EYE SURGERY    . PROSTATE SURGERY         Home Medications    Prior to Admission medications   Medication Sig Start Date End Date Taking? Authorizing Provider  acetaminophen (TYLENOL) 500 MG tablet Take 1,000 mg by mouth every 6 (six) hours as needed for mild pain.   Yes [provider]  amLODipine (NORVASC) 5 MG tablet Take 5 mg by mouth daily. 01/08/20  Yes [provider]  calcium carbonate (TUMS - DOSED IN MG ELEMENTAL CALCIUM) 500 MG chewable tablet Chew 3  tablets by mouth daily.   Yes [provider]  doxazosin (CARDURA) 4 MG tablet Take 4 mg by mouth at bedtime.   Yes [provider]  famotidine (PEPCID) 20 MG tablet Take 1 tablet by mouth once daily 01/06/21  Yes Magrinat, Virgie Dad, MD  finasteride (PROSCAR) 5 MG tablet Take 1 tablet by mouth once daily 01/30/21  Yes Magrinat, Virgie Dad, MD  predniSONE (DELTASONE) 20 MG tablet Take 2 tablets (40 mg total) by mouth daily with breakfast for 5 days. 02/03/21 02/08/21 Yes Zigmund Gottron, NP  acyclovir (ZOVIRAX) 400 MG tablet Take 1 tablet by mouth twice daily 01/13/21   Magrinat, Virgie Dad, MD  aspirin EC 81 MG tablet Take 81 mg by mouth daily. Swallow whole.     [provider]  doxazosin (CARDURA) 8 MG tablet Take 4 mg by mouth daily. 12/17/20   [provider]  gabapentin (NEURONTIN) 100 MG capsule Take 1 capsule (100 mg total) by mouth at bedtime. 12/26/20   Magrinat, Virgie Dad, MD  indapamide (LOZOL) 2.5 MG tablet Take 2.5 mg by mouth daily.     [provider]  lenalidomide (REVLIMID) 10 MG capsule Take 1 tablet daily for 21 days then 7 days off every 28 days Adult male [place] # obtained 01/23/2021  [phone] 01/23/21   Magrinat, Virgie Dad, MD  magnesium citrate SOLN Take 1/2 bottle if no bowel movement in 2 hours repeat 03/06/20   Magrinat, Virgie Dad, MD  ondansetron (ZOFRAN) 8 MG tablet TAKE 1 TABLET BY MOUTH THREE TIMES DAILY AS NEEDED FOR NAUSEA OR VOMITING 01/17/21   Magrinat, Virgie Dad, MD  polyethylene glycol powder (MIRALAX) 17 GM/SCOOP powder Take 255 g by mouth daily. 03/11/20   Causey, Charlestine Massed, NP  potassium chloride (KLOR-CON) 10 MEQ tablet Take 2 tablets (20 mEq total) by mouth daily. 10/30/20   Gardenia Phlegm, NP  pravastatin (PRAVACHOL) 40 MG tablet Take 40 mg by mouth daily. 01/08/20   [provider]  prochlorperazine (COMPAZINE) 10 MG tablet Take 1 tablet (10 mg total) by mouth every 6 (six) hours as needed (Nausea or  vomiting). 03/10/20   Magrinat, Virgie Dad, MD  senna-docusate (SENOKOT-S) 8.6-50 MG tablet Take 2 tablets by mouth 2 (two) times daily. Patient taking differently: Take 2 tablets by mouth 2 (two) times daily as needed.  03/11/20   Gardenia Phlegm, NP  tamsulosin (FLOMAX) 0.4 MG CAPS capsule TAKE 1 CAPSULE BY MOUTH ONCE DAILY AFTER SUPPER 01/21/21   Magrinat, Virgie Dad, MD  traMADol (ULTRAM) 50 MG tablet TAKE 1 TABLET BY MOUTH EVERY 8 HOURS AS NEEDED. 01/27/21   Magrinat, Virgie Dad, MD  vitamin B-12 (CYANOCOBALAMIN) 1000 MCG tablet Take 1,000 mcg by mouth daily.    [provider]    Family History Family History  Problem Relation Age of Onset  . Hypertension Mother     Social History Social History   Tobacco Use  . Smoking status: Never Smoker  . Smokeless tobacco: Never Used  Vaping Use  . Vaping Use: Never used  Substance Use Topics  . Alcohol use: Never  . Drug use: Never     Allergies   Patient has no known allergies.   Review of Systems Review of Systems   Physical Exam Triage Vital Signs ED Triage Vitals  Enc Vitals Group     BP 02/03/21 1047 126/71     Pulse Rate 02/03/21 1047 88     Resp 02/03/21 1047 20     Temp 02/03/21 1047 98.4 F (36.9 C)     Temp Source 02/03/21 1047 Oral     SpO2 02/03/21 1047 100 %     Weight --      Height --      Head Circumference --      Peak Flow --      Pain Score 02/03/21 1043 7     Pain Loc --      Pain Edu? --      Excl. in [place]? --    No data found.  Updated Vital Signs BP 126/71 (BP Location: Right Wrist)   Pulse 88   Temp 98.4 F (36.9 C) (Oral)   Resp 20   SpO2 100%   Visual Acuity Right Eye Distance:   Left Eye Distance:   Bilateral Distance:    Right Eye Near:   Left Eye Near:    Bilateral Near:     Physical Exam Constitutional:      Appearance: He is well-developed.  Cardiovascular:     Rate and Rhythm: Normal rate.  Pulmonary:     Effort: Pulmonary effort is normal.   Musculoskeletal:       Legs:     Comments: Left foot MTP joint tenderness on palpation and with ROM. Swelling noted  with minimal redness; mildly warm; no skin break down or wounds; pain has not extended to foot at all; cap refill < 2 seconds    Skin:    General: Skin is warm and dry.  Neurological:     Mental Status: He is alert and oriented to person, place, and time.      UC Treatments / Results  Labs (all labs ordered are listed, but only abnormal results are displayed) Labs Reviewed - No data to display  EKG   Radiology No results found.  Procedures Procedures (including critical care time)  Medications Ordered in UC Medications - No data to display  Initial Impression / Assessment and Plan / UC Course  I have reviewed the triage vital signs and the nursing notes.  Pertinent labs & imaging results that were available during my care of the patient were reviewed by me and considered in my medical decision making (see chart for details).     Pain improving, although hasn't resolved. No obvious source of cellulitis and symptoms have been improving. Vitals stable. Xray from 2/28 reviewed. Additional prednisone provided today with follow up precautions discussed. Patient verbalized understanding and agreeable to plan.   Final Clinical Impressions(s) / UC Diagnoses   Final diagnoses:  Acute gout involving toe of left foot, unspecified cause     Discharge Instructions     Drink plenty of water.  Tylenol as needed for breakthrough pain.  5 days of prednisone, may stop taking if symptoms have resolved.  Follow up with your PCP and/or oncologist as scheduled and as needed.  Return if worsening- increased pain, redness, warmth or fevers   ED Prescriptions    Medication Sig Dispense Auth. Provider   predniSONE (DELTASONE) 20 MG tablet Take 2 tablets (40 mg total) by mouth daily with breakfast for 5 days. 10 tablet Zigmund Gottron, NP     PDMP not reviewed this  encounter.   Zigmund Gottron, NP 02/03/21 [phone]

## 2021-02-05 NOTE — Progress Notes (Signed)
[place]  [phone] [phone]     ID: Joe [name] DOB: 02/20/1929  MR#: [id number]  JYN#:[id number]  Patient Care Team: Jani Gravel, MD as PCP - General (Internal Medicine) Magrinat, Virgie Dad, MD as Consulting Physician (Oncology) Dr. Iantha Fallen, D.D.S., PA Scot Dock, NP OTHER MD:  CHIEF COMPLAINT: multiple myeloma  CURRENT TREATMENT: denosumab Xgeva every 12 weeks; lenalidomide   INTERVAL HISTORY: Joe [name] returns today for follow up and treatment of his multiple myeloma.  He is now taking Lenalidomide, 8m daily 21 days on and 7 day off.  He tolerates this remarkably well, with minimal constipation, no fatigue or nausea, and no sleepiness.  We are following chiefly his M spike;  Results for FFRED, FRANZEN(MRN [id number] as of 02/05/2021 11:05  Ref. Range 07/03/2020 09:54 08/30/2020 14:03 10/02/2020 08:12 10/30/2020 10:41 12/04/2020 12:55  M Protein SerPl Elph-Mcnc Latest Ref Range: Not Observed g/dL 0.6 (H) 0.5 (H) 0.4 (H) 0.3 (H) Not Observed    The kappa lambda ratio in 11/2020 was 2.63.    Finally he receives denosumab/Xgeva every 12 weeks, most recently given on 12/26/2020.  He is due for this again on 03/26/2021  REVIEW OF SYSTEMS: LJabareeis doing well.  He notes that he was diagnosed with a  Gout flare due to left foot and left great toe swelling.  He was started on prednisone and is improving.  He is eating and drinking well.  HE remains active.  He has no night sweats or unintentional weight loss.  He does not endorse fatigue.  A detailed ROS was otherwise non contributory.     COVID 19 VACCINATION STATUS: He is received Pfizer x2 with booster October 2021 He will receive evusheld in 4 weeks at the updated dosage of 850m     HISTORY OF CURRENT ILLNESS: From the original intake note:  Mr. FiTimothys an [age]/o [place] formerly followed by my partner Dr MoJulien Nordmannor M-GUS. We have an M-Spike of 0.87 documented  07/12/2012. [name] kappa/lambda light chains and total IgG were follower yearly until 01/2016, when they were 2.36 and 1517 respectively (unremarkable). He was lost to follow-up after that point. Yesterday [02/29/2020] the patient presented to Urgent Care with poorly controlled pain. This was felt to be mussculoskeletal and he was started on a medrol dose-pak and robaxin. However his pain worsened and [facility] and [facility] were unremarkable but labs showed a creatinine of 1.84 (baseline 1.0), calcium 12.7 with albumin 2.8 and total protein 10.4 (globulin fraction 7.8). CT of the abdomen obtained for evaluation of his abdominal and back pain showed no hydronephrosis but multiple lytic lesons. We were consulted for further evaluation and treatment of likely multiple myeloma and he was admitted to the hospital.   The patient's subsequent history is as detailed below.   PAST MEDICAL HISTORY: Past Medical History:  Diagnosis Date   Enlarged prostate    Hypertension    [place] [place]    PAST SURGICAL HISTORY: Past Surgical History:  Procedure Laterality Date   EYE SURGERY     PROSTATE SURGERY      FAMILY HISTORY Family History  Problem Relation Age of Onset   Hypertension Mother     SOCIAL HISTORY:  Retired, used to work for the [employer]ives with wife Ga[place]rian Arab Republic[place] 4 children, [place] grandchildren and 5 great-grandchildren. Attends a  local [employer].    ADVANCED DIRECTIVES: at the initial visit the patient confirmed his wife is his HCPOA (despite her memory issues).   HEALTH MAINTENANCE: Social History   Tobacco Use   Smoking status: Never Smoker   Smokeless tobacco: Never Used  Vaping Use   Vaping Use: Never used  Substance Use Topics   Alcohol use: Never   Drug use: Never     Colonoscopy: 6-8 years ago     No Known Allergies  Current  Outpatient Medications  Medication Sig Dispense Refill   acetaminophen (TYLENOL) 500 MG tablet Take 1,000 mg by mouth every 6 (six) hours as needed for mild pain.     acyclovir (ZOVIRAX) 400 MG tablet Take 1 tablet by mouth twice daily 60 tablet 0   amLODipine (NORVASC) 5 MG tablet Take 5 mg by mouth daily.     aspirin EC 81 MG tablet Take 81 mg by mouth daily. Swallow whole.      calcium carbonate (TUMS - DOSED IN MG ELEMENTAL CALCIUM) 500 MG chewable tablet Chew 3 tablets by mouth daily.     doxazosin (CARDURA) 4 MG tablet Take 4 mg by mouth at bedtime.     doxazosin (CARDURA) 8 MG tablet Take 4 mg by mouth daily.     famotidine (PEPCID) 20 MG tablet Take 1 tablet by mouth once daily 90 tablet 0   finasteride (PROSCAR) 5 MG tablet Take 1 tablet by mouth once daily 30 tablet 0   gabapentin (NEURONTIN) 100 MG capsule Take 1 capsule (100 mg total) by mouth at bedtime. 90 capsule 4   indapamide (LOZOL) 2.5 MG tablet Take 2.5 mg by mouth daily.      lenalidomide (REVLIMID) 10 MG capsule Take 1 tablet daily for 21 days then 7 days off every 28 days Adult male [place] # obtained 02/06/2021 [id number] 21 capsule 0   magnesium citrate SOLN Take 1/2 bottle if no bowel movement in 2 hours repeat 195 mL 1   ondansetron (ZOFRAN) 8 MG tablet TAKE 1 TABLET BY MOUTH THREE TIMES DAILY AS NEEDED FOR NAUSEA OR VOMITING 20 tablet 0   polyethylene glycol powder (MIRALAX) 17 GM/SCOOP powder Take 255 g by mouth daily. 255 g 0   potassium chloride (KLOR-CON) 10 MEQ tablet Take 2 tablets (20 mEq total) by mouth daily. 180 tablet 2   pravastatin (PRAVACHOL) 40 MG tablet Take 40 mg by mouth daily.     predniSONE (DELTASONE) 20 MG tablet Take 2 tablets (40 mg total) by mouth daily with breakfast for 5 days. 10 tablet 0   prochlorperazine (COMPAZINE) 10 MG tablet Take 1 tablet (10 mg total) by mouth every 6 (six) hours as needed (Nausea or vomiting). 30 tablet 1   senna-docusate (SENOKOT-S) 8.6-50 MG  tablet Take 2 tablets by mouth 2 (two) times daily. (Patient taking differently: Take 2 tablets by mouth 2 (two) times daily as needed. ) 120 tablet 0   tamsulosin (FLOMAX) 0.4 MG CAPS capsule TAKE 1 CAPSULE BY MOUTH ONCE DAILY AFTER SUPPER 30 capsule 0   traMADol (ULTRAM) 50 MG tablet TAKE 1 TABLET BY MOUTH EVERY 8 HOURS AS NEEDED. 30 tablet 0   vitamin B-12 (CYANOCOBALAMIN) 1000 MCG tablet Take 1,000 mcg by mouth daily.     No current facility-administered medications for this visit.    OBJECTIVE: African-American man in no acute distress Vitals:   02/06/21 1209  BP: (!) 140/57  Pulse: 68  Resp: 18  Temp: 97.9 F (36.6  C)  SpO2: 100%     Body mass index is 25.65 kg/m.   Wt Readings from Last 3 Encounters:  02/06/21 183 lb 14.4 oz (83.4 kg)  12/26/20 186 lb (84.4 kg)  12/04/20 187 lb 1.6 oz (84.9 kg)  ECOG FS:1 - Symptomatic but completely ambulatory GENERAL: Patient is a well appearing male in no acute distress HEENT:  Sclerae anicteric.  Mask in place Neck is supple.  NODES:  No cervical, supraclavicular, or axillary lymphadenopathy palpated.  BREAST EXAM:  Deferred. LUNGS:  Clear to auscultation bilaterally.  No wheezes or rhonchi. HEART:  Regular rate and rhythm. No murmur appreciated. ABDOMEN:  Soft, nontender.  Positive, normoactive bowel sounds. No organomegaly palpated. MSK:  No focal spinal tenderness to palpations. EXTREMITIES:  Left foot swelling SKIN:  Clear with no obvious rashes or skin changes. No nail dyscrasia. NEURO:  Nonfocal. Well oriented.  Appropriate affect.    LAB RESULTS:  CMP     Component Value Date/Time   NA 137 02/06/2021 1116   NA 140 01/09/2016 1045   K 3.0 (L) 02/06/2021 1116   K 3.8 01/09/2016 1045   CL 101 02/06/2021 1116   CO2 28 02/06/2021 1116   CO2 27 01/09/2016 1045   GLUCOSE 84 02/06/2021 1116   GLUCOSE 89 01/09/2016 1045   BUN 22 02/06/2021 1116   BUN 14.7 01/09/2016 1045   CREATININE 1.08 02/06/2021 1116   CREATININE  1.0 01/09/2016 1045   CALCIUM 9.1 02/06/2021 1116   CALCIUM 9.2 01/09/2016 1045   PROT 7.4 02/06/2021 1116   PROT 7.3 01/09/2016 1045   ALBUMIN 3.1 (L) 02/06/2021 1116   ALBUMIN 3.7 01/09/2016 1045   AST 15 02/06/2021 1116   AST 18 01/09/2016 1045   ALT 19 02/06/2021 1116   ALT 15 01/09/2016 1045   ALKPHOS 63 02/06/2021 1116   ALKPHOS 60 01/09/2016 1045   BILITOT 0.4 02/06/2021 1116   BILITOT 0.51 01/09/2016 1045   GFRNONAA >60 02/06/2021 1116   GFRAA >60 08/30/2020 1402    Lab Results  Component Value Date   TOTALPROTELP 6.3 12/04/2020   ALBUMINELP 56.1 07/12/2012   A1GS 3.5 07/12/2012   A2GS 9.8 07/12/2012   BETS 5.1 07/12/2012   BETA2SER 3.6 07/12/2012   GAMS 21.9 (H) 07/12/2012   MSPIKE 0.87 07/12/2012   SPEI * 07/12/2012     Lab Results  Component Value Date   KPAFRELGTCHN 34.2 (H) 12/04/2020   LAMBDASER 13.0 12/04/2020   KAPLAMBRATIO 2.63 (H) 12/04/2020    Lab Results  Component Value Date   WBC 7.6 02/06/2021   NEUTROABS 5.0 02/06/2021   HGB 11.0 (L) 02/06/2021   HCT 33.7 (L) 02/06/2021   MCV 77.8 (L) 02/06/2021   PLT 280 02/06/2021      Chemistry      Component Value Date/Time   NA 137 02/06/2021 1116   NA 140 01/09/2016 1045   K 3.0 (L) 02/06/2021 1116   K 3.8 01/09/2016 1045   CL 101 02/06/2021 1116   CO2 28 02/06/2021 1116   CO2 27 01/09/2016 1045   BUN 22 02/06/2021 1116   BUN 14.7 01/09/2016 1045   CREATININE 1.08 02/06/2021 1116   CREATININE 1.0 01/09/2016 1045      Component Value Date/Time   CALCIUM 9.1 02/06/2021 1116   CALCIUM 9.2 01/09/2016 1045   ALKPHOS 63 02/06/2021 1116   ALKPHOS 60 01/09/2016 1045   AST 15 02/06/2021 1116   AST 18 01/09/2016 1045   ALT 19 02/06/2021  1116   ALT 15 01/09/2016 1045   BILITOT 0.4 02/06/2021 1116   BILITOT 0.51 01/09/2016 1045       No results found for: LABCA2  No components found for: VBTYOM600  No results for input(s): INR in the last 168 hours.  No results found for:  LABCA2  No results found for: KHT977  No results found for: SFS239  No results found for: RVU023  No results found for: CA2729  No components found for: HGQUANT  No results found for: CEA1 / No results found for: CEA1   No results found for: AFPTUMOR  No results found for: CHROMOGRNA  No results found for: HGBA, HGBA2QUANT, HGBFQUANT, HGBSQUAN (Hemoglobinopathy evaluation)   Lab Results  Component Value Date   LDH 136 01/09/2016    Lab Results  Component Value Date   IRON 49 03/01/2020   TIBC 238 (L) 03/01/2020   IRONPCTSAT 21 03/01/2020   (Iron and TIBC)  Lab Results  Component Value Date   FERRITIN 375 (H) 03/01/2020    Urinalysis    Component Value Date/Time   COLORURINE AMBER (A) 03/01/2020 1052   APPEARANCEUR CLOUDY (A) 03/01/2020 1052   LABSPEC 1.021 03/01/2020 1052   PHURINE 5.0 03/01/2020 1052   GLUCOSEU NEGATIVE 03/01/2020 1052   HGBUR NEGATIVE 03/01/2020 1052   BILIRUBINUR NEGATIVE 03/01/2020 1052   KETONESUR NEGATIVE 03/01/2020 1052   PROTEINUR 100 (A) 03/01/2020 1052   UROBILINOGEN 0.2 02/29/2020 1537   NITRITE NEGATIVE 03/01/2020 [place] 03/01/2020 1052    STUDIES: DG Foot Complete Left  Result Date: 01/27/2021 CLINICAL DATA:  Pain and swelling. Pain especially in the big toe. Redness and swelling. Symptoms started 1 week ago. EXAM: LEFT FOOT - COMPLETE 3+ VIEW COMPARISON:  None. FINDINGS: There is diffuse soft tissue swelling of the forefoot. There is no acute fracture or subluxation. No radiopaque foreign body or soft tissue gas. IMPRESSION: Soft tissue swelling. Electronically Signed   By: Nolon Nations M.D.   On: 01/27/2021 15:12   VAS [place] LOWER EXTREMITY VENOUS (DVT)  Result Date: 02/06/2021  Lower Venous DVT Study Indications: Pain, and Swelling.  Risk Factors: Cancer. Comparison Study: No prior studies. Performing Technologist: Oliver Hum RVT  Examination Guidelines: A complete evaluation includes B-mode  imaging, spectral Doppler, color Doppler, and power Doppler as needed of all accessible portions of each vessel. Bilateral testing is considered an integral part of a complete examination. Limited examinations for reoccurring indications may be performed as noted. The reflux portion of the exam is performed with the patient in reverse Trendelenburg.  +-----+---------------+---------+-----------+----------+--------------+  RIGHT Compressibility Phasicity Spontaneity Properties Thrombus Aging  +-----+---------------+---------+-----------+----------+--------------+  CFV   Full            Yes       Yes                                    +-----+---------------+---------+-----------+----------+--------------+   +---------+---------------+---------+-----------+----------+--------------+  LEFT      Compressibility Phasicity Spontaneity Properties Thrombus Aging  +---------+---------------+---------+-----------+----------+--------------+  CFV       Full            Yes       Yes                                    +---------+---------------+---------+-----------+----------+--------------+  SFJ  Full                                                             +---------+---------------+---------+-----------+----------+--------------+  FV Prox   Full                                                             +---------+---------------+---------+-----------+----------+--------------+  FV Mid    Full                                                             +---------+---------------+---------+-----------+----------+--------------+  FV Distal Full                                                             +---------+---------------+---------+-----------+----------+--------------+  PFV       Full                                                             +---------+---------------+---------+-----------+----------+--------------+  POP       Full            Yes       Yes                                     +---------+---------------+---------+-----------+----------+--------------+  PTV       Full                                                             +---------+---------------+---------+-----------+----------+--------------+  PERO      Full                                                             +---------+---------------+---------+-----------+----------+--------------+     Summary: RIGHT: - No evidence of common femoral vein obstruction.  LEFT: - There is no evidence of deep vein thrombosis in the lower extremity.  - No cystic structure found in the popliteal fossa.  *See table(s) above for measurements and observations.    Preliminary      ELIGIBLE FOR AVAILABLE RESEARCH PROTOCOL:   ASSESSMENT:  87 y.o.  70 [place] man with a history of M-GUS, presenting 02/29/2020 with a high globulin fraction, worsening renal function, hypercalcemia, anemia and multiple lytic bone lesions.   (1) IgG Kappa Multiple Myeloma: Labs diagnostic on 03/01/2020 with Mspike of 3.8, Kappa free light chain 690.6, and ratio 117.05.  (a) CT A/P on 03/01/2020 shows "innumerable" lytic lesions involving all visualized bones  (b) bone marrow biopsy 03/11/2020 shows 20% plasmacytosis by CD138, 12% by manual differential; molecular studies not requested  (c) Bortezomib and Dexamethasone given weekly 3 weeks on and 1 week off beginning 03/11/2020, with good response, last dose 07/24/2020  (d) transitioned to lenalidomide 10 mg daily starting 07/31/2020    (2) lytic bone lesions/ hypercalcemia:  (a) Pamidronate administered on 03/01/2020  (b) denosumab/Xgeva started 04/17/2020-- repeat every 12 weeks   (a) to take TUMS 3 tabs on day of Xgeva dose   PLAN:  Joe [name]' is doing well today.  His myeloma labs are pending, but most recently there was no detectible M Spike which is great.  He will continue on treatment with Lenalidomide every day 3 weeks on and 1 week off.  He is tolerating it well.  I asked Dr. Virgie Dad nurse to  get it refilled through the [facility] prescribing program.    Unnamed was diagnosed with gout.  His left foot is swollen.  I think this is likely solely related to gout, however we need to do a doppler to r/o DVT as Lenalidomide increases this risk, particularly in this inflammatory setting.    Mandy and I talked about his injections which include Xgeva and Evusheld.  He will receive these in 4 weeks, in April, after his appt with Dr. Jana Hakim.  He understands this.    His myeloma labs today are pending.  He knows to call us for any questions or concerns he may have between now and his next appt.  Total encounter time 20 minutes.Wilber Bihari, NP 02/06/21 2:59 PM Medical Oncology and Hematology [facility] [place], [place] [address] Tel. [phone]    Fax. [phone]    *Total Encounter Time as defined by the Centers for Medicare and Medicaid Services includes, in addition to the face-to-face time of a patient visit (documented in the note above) non-face-to-face time: obtaining and reviewing outside history, ordering and reviewing medications, tests or procedures, care coordination (communications with other health care professionals or caregivers) and documentation in the medical record.

## 2021-02-06 ENCOUNTER — Other Ambulatory Visit: Payer: Self-pay

## 2021-02-06 ENCOUNTER — Encounter: Payer: Self-pay | Admitting: Adult Health

## 2021-02-06 ENCOUNTER — Ambulatory Visit (HOSPITAL_BASED_OUTPATIENT_CLINIC_OR_DEPARTMENT_OTHER)
Admission: RE | Admit: 2021-02-06 | Discharge: 2021-02-06 | Disposition: A | Discharge status: Home / Self Care | Payer: Medicare Other | Source: Ambulatory Visit | Attending: Adult Health | Admitting: Adult Health

## 2021-02-06 ENCOUNTER — Telehealth: Payer: Self-pay

## 2021-02-06 ENCOUNTER — Inpatient Hospital Stay: Payer: Medicare Other | Attending: Adult Health | Admitting: Adult Health

## 2021-02-06 ENCOUNTER — Inpatient Hospital Stay: Payer: Medicare Other

## 2021-02-06 VITALS — BP 140/57 | HR 68 | Temp 97.9°F | Resp 18 | Ht 71.0 in | Wt 183.9 lb

## 2021-02-06 DIAGNOSIS — Z7189 Other specified counseling: Secondary | ICD-10-CM

## 2021-02-06 DIAGNOSIS — M7989 Other specified soft tissue disorders: Secondary | ICD-10-CM | POA: Insufficient documentation

## 2021-02-06 DIAGNOSIS — D649 Anemia, unspecified: Secondary | ICD-10-CM | POA: Diagnosis not present

## 2021-02-06 DIAGNOSIS — Z8249 Family history of ischemic heart disease and other diseases of the circulatory system: Secondary | ICD-10-CM | POA: Diagnosis not present

## 2021-02-06 DIAGNOSIS — Z79899 Other long term (current) drug therapy: Secondary | ICD-10-CM | POA: Insufficient documentation

## 2021-02-06 DIAGNOSIS — C9000 Multiple myeloma not having achieved remission: Secondary | ICD-10-CM | POA: Insufficient documentation

## 2021-02-06 DIAGNOSIS — N179 Acute kidney failure, unspecified: Secondary | ICD-10-CM

## 2021-02-06 DIAGNOSIS — M549 Dorsalgia, unspecified: Secondary | ICD-10-CM | POA: Diagnosis not present

## 2021-02-06 DIAGNOSIS — K5900 Constipation, unspecified: Secondary | ICD-10-CM | POA: Insufficient documentation

## 2021-02-06 DIAGNOSIS — M109 Gout, unspecified: Secondary | ICD-10-CM | POA: Insufficient documentation

## 2021-02-06 DIAGNOSIS — M79676 Pain in unspecified toe(s): Secondary | ICD-10-CM | POA: Diagnosis not present

## 2021-02-06 DIAGNOSIS — U071 COVID-19: Secondary | ICD-10-CM | POA: Insufficient documentation

## 2021-02-06 LAB — CMP (CANCER CENTER ONLY)
ALT: 19 U/L (ref 0–44)
AST: 15 U/L (ref 15–41)
Albumin: 3.1 g/dL — ABNORMAL LOW (ref 3.5–5.0)
Alkaline Phosphatase: 63 U/L (ref 38–126)
Anion gap: 8 (ref 5–15)
BUN: 22 mg/dL (ref 8–23)
CO2: 28 mmol/L (ref 22–32)
Calcium: 9.1 mg/dL (ref 8.9–10.3)
Chloride: 101 mmol/L (ref 98–111)
Creatinine: 1.08 mg/dL (ref 0.61–1.24)
GFR, Estimated: >60 mL/min (ref >60)
Glucose, Bld: 84 mg/dL (ref 70–99)
Potassium: 3 mmol/L — ABNORMAL LOW (ref 3.5–5.1)
Sodium: 137 mmol/L (ref 135–145)
Total Bilirubin: 0.4 mg/dL (ref 0.3–1.2)
Total Protein: 7.4 g/dL (ref 6.5–8.1)

## 2021-02-06 LAB — CBC WITH DIFFERENTIAL (CANCER CENTER ONLY)
Abs Immature Granulocytes: 0.04 10*3/uL (ref 0.00–0.07)
Basophils Absolute: 0 10*3/uL (ref 0.0–0.1)
Basophils Relative: 0 %
Eosinophils Absolute: 0.1 10*3/uL (ref 0.0–0.5)
Eosinophils Relative: 2 %
HCT: 33.7 % — ABNORMAL LOW (ref 39.0–52.0)
Hemoglobin: 11 g/dL — ABNORMAL LOW (ref 13.0–17.0)
Immature Granulocytes: 1 %
Lymphocytes Relative: 22 %
Lymphs Abs: 1.7 10*3/uL (ref 0.7–4.0)
MCH: 25.4 pg — ABNORMAL LOW (ref 26.0–34.0)
MCHC: 32.6 g/dL (ref 30.0–36.0)
MCV: 77.8 fL — ABNORMAL LOW (ref 80.0–100.0)
Monocytes Absolute: 0.8 10*3/uL (ref 0.1–1.0)
Monocytes Relative: 11 %
Neutro Abs: 5 10*3/uL (ref 1.7–7.7)
Neutrophils Relative %: 64 %
Platelet Count: 280 10*3/uL (ref 150–400)
RBC: 4.33 MIL/uL (ref 4.22–5.81)
RDW: 13.7 % (ref 11.5–15.5)
WBC Count: 7.6 10*3/uL (ref 4.0–10.5)
nRBC: 0 % (ref 0.0–0.2)

## 2021-02-06 MED ORDER — LENALIDOMIDE 10 MG PO CAPS: ORAL_CAPSULE | ORAL | 0 refills | Status: DC

## 2021-02-06 NOTE — Progress Notes (Signed)
Left lower extremity venous duplex has been completed. Preliminary results can be found in CV Proc through chart review.  Results were given to Santiago Glad at [employer] NP office.  02/06/21 1:43 PM Carlos Levering RVT

## 2021-02-06 NOTE — Telephone Encounter (Signed)
TC from [place] in vascular informed giving verbal report for ultrasound done on Joe [name]. Report was negative for DVT. Message relayed to Suzan Garibaldi NP.

## 2021-02-07 LAB — KAPPA/LAMBDA LIGHT CHAINS
Kappa free light chain: 28.7 mg/L — ABNORMAL HIGH (ref 3.3–19.4)
Kappa, lambda light chain ratio: 1.79 — ABNORMAL HIGH (ref 0.26–1.65)
Lambda free light chains: 16 mg/L (ref 5.7–26.3)

## 2021-02-10 LAB — MULTIPLE MYELOMA PANEL, SERUM
Albumin SerPl Elph-Mcnc: 3.1 g/dL (ref 2.9–4.4)
Albumin/Glob SerPl: 0.9 (ref 0.7–1.7)
Alpha 1: 0.3 g/dL (ref 0.0–0.4)
Alpha2 Glob SerPl Elph-Mcnc: 0.9 g/dL (ref 0.4–1.0)
B-Globulin SerPl Elph-Mcnc: 1 g/dL (ref 0.7–1.3)
Gamma Glob SerPl Elph-Mcnc: 1.4 g/dL (ref 0.4–1.8)
Globulin, Total: 3.6 g/dL (ref 2.2–3.9)
IgA: 185 mg/dL (ref 61–437)
IgG (Immunoglobin G), Serum: 1383 mg/dL (ref 603–1613)
IgM (Immunoglobulin M), Srm: 31 mg/dL (ref 15–143)
Total Protein ELP: 6.7 g/dL (ref 6.0–8.5)

## 2021-02-11 ENCOUNTER — Other Ambulatory Visit: Payer: Self-pay | Admitting: Oncology

## 2021-02-11 DIAGNOSIS — C9000 Multiple myeloma not having achieved remission: Secondary | ICD-10-CM

## 2021-02-16 ENCOUNTER — Other Ambulatory Visit: Payer: Self-pay | Admitting: Physician Assistant

## 2021-02-16 DIAGNOSIS — C9000 Multiple myeloma not having achieved remission: Secondary | ICD-10-CM

## 2021-02-16 NOTE — Progress Notes (Signed)
I connected by phone with Joe [name] on 02/16/2021, 12:39 PM to discuss the potential use of a new treatment, tixagevimab/cilgavimab, for pre-exposure prophylaxis for prevention of coronavirus disease 2019 (COVID-19) caused by the SARS-CoV-2 virus.  This patient is a 87 y.o. male that meets the FDA criteria for Emergency Use Authorization of tixagevimab/cilgavimab for pre-exposure prophylaxis of COVID-19 disease. Pt meets following criteria:  Age >12 yr and weight > 40kg  Not currently infected with SARS-CoV-2 and has no known recent exposure to an individual infected with SARS-CoV-2 AND o Who has moderate to severe immune compromise due to a medical condition or receipt of immunosuppressive medications or treatments and may not mount an adequate immune response to COVID-19 vaccination or  o Vaccination with any available COVID-19 vaccine, according to the approved or authorized schedule, is not recommended due to a history of severe adverse reaction (e.g., severe allergic reaction) to a COVID-19 vaccine(s) and/or COVID-19 vaccine component(s).  o Patient meets the following definition of mod-severe immune compromised status: 6. Other actively treated hematologic malignancies or severe congenital immunodeficiency syndromes  I have spoken and communicated the following to the patient or parent/caregiver regarding COVID monoclonal antibody treatment:  1. FDA has authorized the emergency use of tixagevimab/cilgavimab for the pre-exposure prophylaxis of COVID-19 in patients with moderate-severe immunocompromised status, who meet above EUA criteria.  2. The significant known and potential risks and benefits of COVID monoclonal antibody, and the extent to which such potential risks and benefits are unknown.  3. Information on available alternative treatments and the risks and benefits of those alternatives, including clinical trials.  4. The patient or parent/caregiver has the option to accept or  refuse COVID monoclonal antibody treatment.  After reviewing this information with the patient, agree to receive tixagevimab/cilgavimab  Angelena Form, PA-C, 02/16/2021, 12:39 PM

## 2021-02-17 DIAGNOSIS — R946 Abnormal results of thyroid function studies: Secondary | ICD-10-CM | POA: Diagnosis not present

## 2021-02-17 DIAGNOSIS — E785 Hyperlipidemia, unspecified: Secondary | ICD-10-CM | POA: Diagnosis not present

## 2021-02-17 DIAGNOSIS — R7309 Other abnormal glucose: Secondary | ICD-10-CM | POA: Diagnosis not present

## 2021-02-17 DIAGNOSIS — I10 Essential (primary) hypertension: Secondary | ICD-10-CM | POA: Diagnosis not present

## 2021-02-27 DIAGNOSIS — D509 Iron deficiency anemia, unspecified: Secondary | ICD-10-CM | POA: Diagnosis not present

## 2021-02-27 DIAGNOSIS — M25476 Effusion, unspecified foot: Secondary | ICD-10-CM | POA: Diagnosis not present

## 2021-02-27 DIAGNOSIS — Z0000 Encounter for general adult medical examination without abnormal findings: Secondary | ICD-10-CM | POA: Diagnosis not present

## 2021-02-27 DIAGNOSIS — C61 Malignant neoplasm of prostate: Secondary | ICD-10-CM | POA: Diagnosis not present

## 2021-02-27 DIAGNOSIS — I10 Essential (primary) hypertension: Secondary | ICD-10-CM | POA: Diagnosis not present

## 2021-02-27 DIAGNOSIS — E785 Hyperlipidemia, unspecified: Secondary | ICD-10-CM | POA: Diagnosis not present

## 2021-02-27 DIAGNOSIS — C439 Malignant melanoma of skin, unspecified: Secondary | ICD-10-CM | POA: Diagnosis not present

## 2021-02-28 ENCOUNTER — Other Ambulatory Visit: Payer: Self-pay | Admitting: Oncology

## 2021-02-28 DIAGNOSIS — D472 Monoclonal gammopathy: Secondary | ICD-10-CM

## 2021-03-05 ENCOUNTER — Other Ambulatory Visit: Payer: Self-pay | Admitting: Oncology

## 2021-03-13 ENCOUNTER — Other Ambulatory Visit: Payer: Self-pay | Admitting: Oncology

## 2021-03-13 DIAGNOSIS — C9000 Multiple myeloma not having achieved remission: Secondary | ICD-10-CM

## 2021-03-19 NOTE — Progress Notes (Signed)
Joe [name]  [phone] [phone]     ID: Joe [name] DOB: [birth year]  Joe#: [id number]  VFI#:[id number]  Patient Care Team: Jani Gravel, MD as PCP - General (Internal Medicine) Adalia Pettis, Virgie Dad, MD as Consulting Physician (Oncology) Long-Stokes, Benson Setting, MD OTHER MD:  CHIEF COMPLAINT: multiple myeloma  CURRENT TREATMENT: denosumab Xgeva every 12 weeks; lenalidomide   INTERVAL HISTORY: Joe [name] returns today for follow up and treatment of his multiple myeloma.  He is accompanied by his wife.  He is now taking Lenalidomide, 64m daily 21 days on and 7 day off.  He tolerates this remarkably well, with minimal constipation, no fatigue or nausea, and no sleepiness.  We are following chiefly his M spike;  Results for FNIRAJ, KUDRNA(MRN [id number] as of 02/05/2021 11:05  Ref. Range 07/03/2020 09:54 08/30/2020 14:03 10/02/2020 08:12 10/30/2020 10:41 12/04/2020 12:55  M Protein SerPl Elph-Mcnc Latest Ref Range: Not Observed g/dL 0.6 (H) 0.5 (H) 0.4 (H) 0.3 (H) Not Observed    The kappa lambda ratio in 11/2020 was 2.63.    Finally he receives denosumab/Xgeva every 12 weeks, most recently given on 12/26/2020.  He is due for this again on 03/26/2021   REVIEW OF SYSTEMS: LRentontells me he is a little bit more tired than he would like.  He is doing a little walking for exercise but has not started gardening yet.  He has had no fevers rash or bleeding.  He has a little bit of low back pain which comes and goes and is not a new problem.  He does have gout and is being treated for that with some steroids.  He has no peripheral neuropathy symptoms to report.  He tells me he has been taking his potassium supplementation.  A detailed review of systems today was otherwise stable.   COVID 19 VACCINATION STATUS: He is received Pfizer x2 with booster October 2021 He will receive evusheld in 4 weeks at the updated dosage of 3027m     HISTORY OF CURRENT  ILLNESS: From the original intake note:  Joe. [name] an [age]/o [place] formerly followed by my partner Dr MoJulien Nordmannor M-GUS. We have an M-Spike of 0.87 documented 07/12/2012. [name] kappa/lambda light chains and total IgG were follower yearly until 01/2016, when they were 2.36 and 1517 respectively (unremarkable). He was lost to follow-up after that point. Yesterday [02/29/2020] the patient presented to Urgent Care with poorly controlled pain. This was felt to be mussculoskeletal and he was started on a medrol dose-pak and robaxin. However his pain worsened and he presented to [facility] ED [facility]here vitals and exam were unremarkable but labs showed a creatinine of 1.84 (baseline 1.0), calcium 12.7 with albumin 2.8 and total protein 10.4 (globulin fraction 7.8). CT of the abdomen obtained for evaluation of his abdominal and back pain showed no hydronephrosis but multiple lytic lesons. We were consulted for further evaluation and treatment of likely multiple myeloma and he was admitted to the hospital.   The patient's subsequent history is as detailed below.   PAST MEDICAL HISTORY: Past Medical History:  Diagnosis Date  . Enlarged prostate   . Hypertension   . [place] [place]    PAST SURGICAL HISTORY: Past Surgical History:  Procedure Laterality Date  . EYE SURGERY    . PROSTATE SURGERY      FAMILY HISTORY Family History  Problem Relation Age of Onset  . Hypertension Mother  SOCIAL HISTORY:  Retired, used to work for the [employer] Lives with wife [place]. Has 4 children, 2 in [place], one in [place], one in [place]; 5 grandchildren and 5 great-grandchildren. Attends a [employer].    ADVANCED DIRECTIVES: at the initial visit the patient confirmed his wife is his HCPOA (despite her memory issues).   HEALTH MAINTENANCE: Social History   Tobacco Use  . Smoking status: Never Smoker  . Smokeless tobacco: Never Used   Vaping Use  . Vaping Use: Never used  Substance Use Topics  . Alcohol use: Never  . Drug use: Never     Colonoscopy: 6-8 years ago     No Known Allergies  Current Outpatient Medications  Medication Sig Dispense Refill  . acetaminophen (TYLENOL) 500 MG tablet Take 1,000 mg by mouth every 6 (six) hours as needed for mild pain.    [name] acyclovir (ZOVIRAX) 400 MG tablet Take 1 tablet by mouth twice daily 60 tablet 0  . amLODipine (NORVASC) 5 MG tablet Take 5 mg by mouth daily.    [name] aspirin EC 81 MG tablet Take 81 mg by mouth daily. Swallow whole.     . calcium carbonate (TUMS - DOSED IN MG ELEMENTAL CALCIUM) 500 MG chewable tablet Chew 3 tablets by mouth daily.    [name] COVID-19 mRNA vaccine, Pfizer, 30 MCG/0.3ML injection INJECT AS DIRECTED .3 mL 0  . doxazosin (CARDURA) 4 MG tablet Take 4 mg by mouth at bedtime.    [name] doxazosin (CARDURA) 8 MG tablet Take 4 mg by mouth daily.    . famotidine (PEPCID) 20 MG tablet Take 1 tablet by mouth once daily 90 tablet 0  . finasteride (PROSCAR) 5 MG tablet Take 1 tablet by mouth once daily 30 tablet 0  . gabapentin (NEURONTIN) 100 MG capsule Take 1 capsule (100 mg total) by mouth at bedtime. 90 capsule 4  . indapamide (LOZOL) 2.5 MG tablet Take 2.5 mg by mouth daily.     [name] lenalidomide (REVLIMID) 10 MG capsule Take 1 tablet daily for 21 days then 7 days off every 28 days Adult male [place] # obtained 03/20/2021 [id number] 21 capsule 0  . magnesium citrate SOLN Take 1/2 bottle if no bowel movement in 2 hours repeat 195 mL 1  . ondansetron (ZOFRAN) 8 MG tablet TAKE 1 TABLET BY MOUTH THREE TIMES DAILY AS NEEDED FOR NAUSEA OR VOMITING 20 tablet 0  . polyethylene glycol powder (MIRALAX) 17 GM/SCOOP powder Take 255 g by mouth daily. 255 g 0  . potassium chloride (KLOR-CON) 10 MEQ tablet Take 2 tablets (20 mEq total) by mouth daily. 180 tablet 2  . pravastatin (PRAVACHOL) 40 MG tablet Take 40 mg by mouth daily.    . prochlorperazine (COMPAZINE) 10 MG tablet  Take 1 tablet (10 mg total) by mouth every 6 (six) hours as needed (Nausea or vomiting). 30 tablet 1  . senna-docusate (SENOKOT-S) 8.6-50 MG tablet Take 2 tablets by mouth 2 (two) times daily. (Patient taking differently: Take 2 tablets by mouth 2 (two) times daily as needed. ) 120 tablet 0  . tamsulosin (FLOMAX) 0.4 MG CAPS capsule TAKE 1 CAPSULE BY MOUTH ONCE DAILY AFTER SUPPER 30 capsule 0  . traMADol (ULTRAM) 50 MG tablet TAKE 1 TABLET BY MOUTH EVERY 8 HOURS AS NEEDED. 30 tablet 0  . vitamin B-12 (CYANOCOBALAMIN) 1000 MCG tablet Take 1,000 mcg by mouth daily.     No current facility-administered medications for this visit.    OBJECTIVE:  African-American man i who appears stated age 19:   03/20/21 1125  BP: (!) 133/58  Pulse: 69  Resp: 17  Temp: 97.6 F (36.4 C)  SpO2: 100%     Body mass index is 25.27 kg/m.   Wt Readings from Last 3 Encounters:  03/20/21 181 lb 3.2 oz (82.2 kg)  02/06/21 183 lb 14.4 oz (83.4 kg)  12/26/20 186 lb (84.4 kg)  ECOG FS:1 - Symptomatic but completely ambulatory  Sclerae unicteric, EOMs intact Wearing a mask No cervical or supraclavicular adenopathy Lungs no rales or rhonchi Heart regular rate and rhythm Abd soft, nontender, positive bowel sounds MSK no focal spinal tenderness Neuro: nonfocal, well oriented, appropriate affect   LAB RESULTS:  CMP     Component Value Date/Time   NA 140 03/20/2021 1100   NA 140 01/09/2016 1045   K 3.1 (L) 03/20/2021 1100   K 3.8 01/09/2016 1045   CL 103 03/20/2021 1100   CO2 26 03/20/2021 1100   CO2 27 01/09/2016 1045   GLUCOSE 96 03/20/2021 1100   GLUCOSE 89 01/09/2016 1045   BUN 23 03/20/2021 1100   BUN 14.7 01/09/2016 1045   CREATININE 1.06 03/20/2021 1100   CREATININE 1.0 01/09/2016 1045   CALCIUM 8.4 (L) 03/20/2021 1100   CALCIUM 9.2 01/09/2016 1045   PROT 6.7 03/20/2021 1100   PROT 7.3 01/09/2016 1045   ALBUMIN 3.4 (L) 03/20/2021 1100   ALBUMIN 3.7 01/09/2016 1045   AST 15 03/20/2021  1100   AST 18 01/09/2016 1045   ALT 11 03/20/2021 1100   ALT 15 01/09/2016 1045   ALKPHOS 52 03/20/2021 1100   ALKPHOS 60 01/09/2016 1045   BILITOT 0.6 03/20/2021 1100   BILITOT 0.51 01/09/2016 1045   GFRNONAA >60 03/20/2021 1100   GFRAA >60 08/30/2020 1402    Lab Results  Component Value Date   TOTALPROTELP 6.7 02/06/2021   ALBUMINELP 56.1 07/12/2012   A1GS 3.5 07/12/2012   A2GS 9.8 07/12/2012   BETS 5.1 07/12/2012   BETA2SER 3.6 07/12/2012   GAMS 21.9 (H) 07/12/2012   MSPIKE 0.87 07/12/2012   SPEI * 07/12/2012     Lab Results  Component Value Date   KPAFRELGTCHN 28.7 (H) 02/06/2021   LAMBDASER 16.0 02/06/2021   KAPLAMBRATIO 1.79 (H) 02/06/2021    Lab Results  Component Value Date   WBC 4.3 03/20/2021   NEUTROABS 2.4 03/20/2021   HGB 9.6 (L) 03/20/2021   HCT 30.7 (L) 03/20/2021   MCV 80.6 03/20/2021   PLT 117 (L) 03/20/2021      Chemistry      Component Value Date/Time   NA 140 03/20/2021 1100   NA 140 01/09/2016 1045   K 3.1 (L) 03/20/2021 1100   K 3.8 01/09/2016 1045   CL 103 03/20/2021 1100   CO2 26 03/20/2021 1100   CO2 27 01/09/2016 1045   BUN 23 03/20/2021 1100   BUN 14.7 01/09/2016 1045   CREATININE 1.06 03/20/2021 1100   CREATININE 1.0 01/09/2016 1045      Component Value Date/Time   CALCIUM 8.4 (L) 03/20/2021 1100   CALCIUM 9.2 01/09/2016 1045   ALKPHOS 52 03/20/2021 1100   ALKPHOS 60 01/09/2016 1045   AST 15 03/20/2021 1100   AST 18 01/09/2016 1045   ALT 11 03/20/2021 1100   ALT 15 01/09/2016 1045   BILITOT 0.6 03/20/2021 1100   BILITOT 0.51 01/09/2016 1045       No results found for: LABCA2  No components found for:  GQQPYP950  No results for input(s): INR in the last 168 hours.  No results found for: LABCA2  No results found for: DTO671  No results found for: IWP809  No results found for: XIP382  No results found for: CA2729  No components found for: HGQUANT  No results found for: CEA1 / No results found for:  CEA1   No results found for: AFPTUMOR  No results found for: CHROMOGRNA  No results found for: HGBA, HGBA2QUANT, HGBFQUANT, HGBSQUAN (Hemoglobinopathy evaluation)   Lab Results  Component Value Date   LDH 136 01/09/2016    Lab Results  Component Value Date   IRON 49 03/01/2020   TIBC 238 (L) 03/01/2020   IRONPCTSAT 21 03/01/2020   (Iron and TIBC)  Lab Results  Component Value Date   FERRITIN 375 (H) 03/01/2020    Urinalysis    Component Value Date/Time   COLORURINE AMBER (A) 03/01/2020 1052   APPEARANCEUR CLOUDY (A) 03/01/2020 1052   LABSPEC 1.021 03/01/2020 1052   PHURINE 5.0 03/01/2020 1052   GLUCOSEU NEGATIVE 03/01/2020 1052   HGBUR NEGATIVE 03/01/2020 1052   BILIRUBINUR NEGATIVE 03/01/2020 1052   KETONESUR NEGATIVE 03/01/2020 1052   PROTEINUR 100 (A) 03/01/2020 1052   UROBILINOGEN 0.2 02/29/2020 1537   NITRITE NEGATIVE 03/01/2020 [place] 03/01/2020 1052    STUDIES: No results found.   ELIGIBLE FOR AVAILABLE RESEARCH PROTOCOL:   ASSESSMENT: 87 y.o.  43 [place] man with a history of M-GUS, presenting 02/29/2020 with a high globulin fraction, worsening renal function, hypercalcemia, anemia and multiple lytic bone lesions.   (1) IgG Kappa Multiple Myeloma: Labs diagnostic on 03/01/2020 with Mspike of 3.8, Kappa free light chain 690.6, and ratio 117.05.  (a) CT A/P on 03/01/2020 shows "innumerable" lytic lesions involving all visualized bones  (b) bone marrow biopsy 03/11/2020 shows 20% plasmacytosis by CD138, 12% by manual differential; molecular studies not requested  (c) Bortezomib and Dexamethasone given weekly 3 weeks on and 1 week off beginning 03/11/2020, with good response, last dose 07/24/2020  (d) transitioned to lenalidomide 10 mg daily starting 07/31/2020    (2) lytic bone lesions/ hypercalcemia:  (a) Pamidronate administered on 03/01/2020  (b) denosumab/Xgeva started 04/17/2020-- repeat every 12 weeks   (a) to take TUMS 3 tabs  on day of Xgeva dose   PLAN:  Joe. Tudisco is now a year out from initial diagnosis of multiple myeloma.  He has been on a very stable dose of lenalidomide with good tolerance.  I did suggest he take the lenalidomide at bedtime.  So far he has not had peripheral neuropathy.  He is now anemic and that likely explains his feeling of fatigue.  It could be secondary to inflammation of course now that he has a flare of gout.  On the other hand it could be the lenalidomide and I am asking him to not restarted in 1 week as he normally would (he only has 2 more days on the current cycle) but give himself an extra week and not started on till 04/05/2021.  I am going to bring him in for lab work only in 4 weeks and we will do an anemia work-up at that time.  He will return to see me again of course in 12 weeks and receive his next Xgeva dose at that time.  If we do document progression on the myeloma lab panel we will move to bone marrow biopsy and reconsider treatment options  Total encounter time 25 minutes.*  Virgie Dad. Grisel Blumenstock, MD 03/20/21 12:12 PM Medical Oncology and Hematology [facility] [place], [place] [address] Tel. [phone]    Fax. [phone]   I, Wilburn Mylar, am acting as scribe for Dr. Virgie Dad. Tajanay Hurley.  I, Lurline Del MD, have reviewed the above documentation for accuracy and completeness, and I agree with the above.    *Total Encounter Time as defined by the Centers for Medicare and Medicaid Services includes, in addition to the face-to-face time of a patient visit (documented in the note above) non-face-to-face time: obtaining and reviewing outside history, ordering and reviewing medications, tests or procedures, care coordination (communications with other health care professionals or caregivers) and documentation in the medical record.

## 2021-03-20 ENCOUNTER — Other Ambulatory Visit: Payer: Self-pay

## 2021-03-20 ENCOUNTER — Inpatient Hospital Stay: Payer: Medicare Other

## 2021-03-20 ENCOUNTER — Inpatient Hospital Stay: Payer: Medicare Other | Attending: Adult Health | Admitting: Oncology

## 2021-03-20 VITALS — BP 97/47 | HR 55 | Temp 97.6°F | Resp 16

## 2021-03-20 VITALS — BP 133/58 | HR 69 | Temp 97.6°F | Resp 17 | Ht 71.0 in | Wt 181.2 lb

## 2021-03-20 DIAGNOSIS — D649 Anemia, unspecified: Secondary | ICD-10-CM | POA: Insufficient documentation

## 2021-03-20 DIAGNOSIS — N179 Acute kidney failure, unspecified: Secondary | ICD-10-CM

## 2021-03-20 DIAGNOSIS — Z7189 Other specified counseling: Secondary | ICD-10-CM

## 2021-03-20 DIAGNOSIS — C9000 Multiple myeloma not having achieved remission: Secondary | ICD-10-CM

## 2021-03-20 DIAGNOSIS — Z79899 Other long term (current) drug therapy: Secondary | ICD-10-CM | POA: Insufficient documentation

## 2021-03-20 DIAGNOSIS — Z298 Encounter for other specified prophylactic measures: Secondary | ICD-10-CM | POA: Insufficient documentation

## 2021-03-20 DIAGNOSIS — M109 Gout, unspecified: Secondary | ICD-10-CM | POA: Diagnosis not present

## 2021-03-20 LAB — CMP (CANCER CENTER ONLY)
ALT: 11 U/L (ref 0–44)
AST: 15 U/L (ref 15–41)
Albumin: 3.4 g/dL — ABNORMAL LOW (ref 3.5–5.0)
Alkaline Phosphatase: 52 U/L (ref 38–126)
Anion gap: 11 (ref 5–15)
BUN: 23 mg/dL (ref 8–23)
CO2: 26 mmol/L (ref 22–32)
Calcium: 8.4 mg/dL — ABNORMAL LOW (ref 8.9–10.3)
Chloride: 103 mmol/L (ref 98–111)
Creatinine: 1.06 mg/dL (ref 0.61–1.24)
GFR, Estimated: >60 mL/min (ref >60)
Glucose, Bld: 96 mg/dL (ref 70–99)
Potassium: 3.1 mmol/L — ABNORMAL LOW (ref 3.5–5.1)
Sodium: 140 mmol/L (ref 135–145)
Total Bilirubin: 0.6 mg/dL (ref 0.3–1.2)
Total Protein: 6.7 g/dL (ref 6.5–8.1)

## 2021-03-20 LAB — CBC WITH DIFFERENTIAL (CANCER CENTER ONLY)
Abs Immature Granulocytes: 0.02 10*3/uL (ref 0.00–0.07)
Basophils Absolute: 0 10*3/uL (ref 0.0–0.1)
Basophils Relative: 1 %
Eosinophils Absolute: 0.2 10*3/uL (ref 0.0–0.5)
Eosinophils Relative: 4 %
HCT: 30.7 % — ABNORMAL LOW (ref 39.0–52.0)
Hemoglobin: 9.6 g/dL — ABNORMAL LOW (ref 13.0–17.0)
Immature Granulocytes: 1 %
Lymphocytes Relative: 25 %
Lymphs Abs: 1.1 10*3/uL (ref 0.7–4.0)
MCH: 25.2 pg — ABNORMAL LOW (ref 26.0–34.0)
MCHC: 31.3 g/dL (ref 30.0–36.0)
MCV: 80.6 fL (ref 80.0–100.0)
Monocytes Absolute: 0.7 10*3/uL (ref 0.1–1.0)
Monocytes Relative: 15 %
Neutro Abs: 2.4 10*3/uL (ref 1.7–7.7)
Neutrophils Relative %: 54 %
Platelet Count: 117 10*3/uL — ABNORMAL LOW (ref 150–400)
RBC: 3.81 MIL/uL — ABNORMAL LOW (ref 4.22–5.81)
RDW: 15.3 % (ref 11.5–15.5)
WBC Count: 4.3 10*3/uL (ref 4.0–10.5)
nRBC: 0 % (ref 0.0–0.2)

## 2021-03-20 MED ORDER — TIXAGEVIMAB (PART OF EVUSHELD) INJECTION
300.0000 mg | Freq: Once | INTRAMUSCULAR | Status: AC
  Administered 2021-03-20: 300 mg via INTRAMUSCULAR
  Filled 2021-03-20: qty 3

## 2021-03-20 MED ORDER — LENALIDOMIDE 10 MG PO CAPS: ORAL_CAPSULE | ORAL | 0 refills | Status: DC

## 2021-03-20 MED ORDER — CILGAVIMAB (PART OF EVUSHELD) INJECTION
300.0000 mg | Freq: Once | INTRAMUSCULAR | Status: AC
  Administered 2021-03-20: 300 mg via INTRAMUSCULAR
  Filled 2021-03-20: qty 3

## 2021-03-20 MED ORDER — DENOSUMAB 120 MG/1.7ML ~~LOC~~ SOLN
SUBCUTANEOUS | Status: AC
  Filled 2021-03-20: qty 1.7

## 2021-03-20 MED ORDER — DENOSUMAB 120 MG/1.7ML ~~LOC~~ SOLN
120.0000 mg | Freq: Once | SUBCUTANEOUS | Status: AC
  Administered 2021-03-20: 120 mg via SUBCUTANEOUS

## 2021-03-20 NOTE — Progress Notes (Signed)
Ok to proceed w/ Xgeva today w/ Corr Ca++ = 8.8 per Dr. Jana Hakim. Pt should be reminded to "take extra Tums today and tomorrow."  Kennith Center, Pharm.D., CPP 03/20/2021@12 :40 PM

## 2021-03-20 NOTE — Progress Notes (Signed)
Joe [name] monitored for 1 hour post Evusheld injection. Patient feeling well and vital signs stable following injection.

## 2021-03-20 NOTE — Patient Instructions (Signed)
Denosumab injection What is this medicine? DENOSUMAB (den oh sue mab) slows bone breakdown. Prolia is used to treat osteoporosis in women after menopause and in men, and in people who are taking corticosteroids for 6 months or more. Xgeva is used to treat a high calcium level due to cancer and to prevent bone fractures and other bone problems caused by multiple myeloma or cancer bone metastases. Xgeva is also used to treat giant cell tumor of the bone. This medicine may be used for other purposes; ask your health care provider or pharmacist if you have questions. COMMON BRAND NAME(S): Prolia, XGEVA What should I tell my health care provider before I take this medicine? They need to know if you have any of these conditions:  dental disease  having surgery or tooth extraction  infection  kidney disease  low levels of calcium or Vitamin D in the blood  malnutrition  on hemodialysis  skin conditions or sensitivity  thyroid or parathyroid disease  an unusual reaction to denosumab, other medicines, foods, dyes, or preservatives  pregnant or trying to get pregnant  breast-feeding How should I use this medicine? This medicine is for injection under the skin. It is given by a health care professional in a hospital or clinic setting. A special MedGuide will be given to you before each treatment. Be sure to read this information carefully each time. For Prolia, talk to your pediatrician regarding the use of this medicine in children. Special care may be needed. For Xgeva, talk to your pediatrician regarding the use of this medicine in children. While this drug may be prescribed for children as young as 13 years for selected conditions, precautions do apply. Overdosage: If you think you have taken too much of this medicine contact a poison control center or emergency room at once. NOTE: This medicine is only for you. Do not share this medicine with others. What if I miss a dose? It is  important not to miss your dose. Call your doctor or health care professional if you are unable to keep an appointment. What may interact with this medicine? Do not take this medicine with any of the following medications:  other medicines containing denosumab This medicine may also interact with the following medications:  medicines that lower your chance of fighting infection  steroid medicines like prednisone or cortisone This list may not describe all possible interactions. Give your health care provider a list of all the medicines, herbs, non-prescription drugs, or dietary supplements you use. Also tell them if you smoke, drink alcohol, or use illegal drugs. Some items may interact with your medicine. What should I watch for while using this medicine? Visit your doctor or health care professional for regular checks on your progress. Your doctor or health care professional may order blood tests and other tests to see how you are doing. Call your doctor or health care professional for advice if you get a fever, chills or sore throat, or other symptoms of a cold or flu. Do not treat yourself. This drug may decrease your body's ability to fight infection. Try to avoid being around people who are sick. You should make sure you get enough calcium and vitamin D while you are taking this medicine, unless your doctor tells you not to. Discuss the foods you eat and the vitamins you take with your health care professional. See your dentist regularly. Brush and floss your teeth as directed. Before you have any dental work done, tell your dentist you are   receiving this medicine. Do not become pregnant while taking this medicine or for 5 months after stopping it. Talk with your doctor or health care professional about your birth control options while taking this medicine. Women should inform their doctor if they wish to become pregnant or think they might be pregnant. There is a potential for serious side  effects to an unborn child. Talk to your health care professional or pharmacist for more information. What side effects may I notice from receiving this medicine? Side effects that you should report to your doctor or health care professional as soon as possible:  allergic reactions like skin rash, itching or hives, swelling of the face, lips, or tongue  bone pain  breathing problems  dizziness  jaw pain, especially after dental work  redness, blistering, peeling of the skin  signs and symptoms of infection like fever or chills; cough; sore throat; pain or trouble passing urine  signs of low calcium like fast heartbeat, muscle cramps or muscle pain; pain, tingling, numbness in the hands or feet; seizures  unusual bleeding or bruising  unusually weak or tired Side effects that usually do not require medical attention (report to your doctor or health care professional if they continue or are bothersome):  constipation  diarrhea  headache  joint pain  loss of appetite  muscle pain  runny nose  tiredness  upset stomach This list may not describe all possible side effects. Call your doctor for medical advice about side effects. You may report side effects to FDA at 1-800-FDA-1088. Where should I keep my medicine? This medicine is only given in a clinic, doctor's office, or other health care setting and will not be stored at home. NOTE: This sheet is a summary. It may not cover all possible information. If you have questions about this medicine, talk to your doctor, pharmacist, or health care provider.  2021 Elsevier/Gold Standard (2018-03-25 16:10:44)

## 2021-03-21 LAB — KAPPA/LAMBDA LIGHT CHAINS
Kappa free light chain: 27.9 mg/L — ABNORMAL HIGH (ref 3.3–19.4)
Kappa, lambda light chain ratio: 2.07 — ABNORMAL HIGH (ref 0.26–1.65)
Lambda free light chains: 13.5 mg/L (ref 5.7–26.3)

## 2021-03-24 LAB — MULTIPLE MYELOMA PANEL, SERUM
Albumin SerPl Elph-Mcnc: 3.2 g/dL (ref 2.9–4.4)
Albumin/Glob SerPl: 1.2 (ref 0.7–1.7)
Alpha 1: 0.3 g/dL (ref 0.0–0.4)
Alpha2 Glob SerPl Elph-Mcnc: 0.8 g/dL (ref 0.4–1.0)
B-Globulin SerPl Elph-Mcnc: 0.9 g/dL (ref 0.7–1.3)
Gamma Glob SerPl Elph-Mcnc: 0.9 g/dL (ref 0.4–1.8)
Globulin, Total: 2.8 g/dL (ref 2.2–3.9)
IgA: 137 mg/dL (ref 61–437)
IgG (Immunoglobin G), Serum: 964 mg/dL (ref 603–1613)
IgM (Immunoglobulin M), Srm: 25 mg/dL (ref 15–143)
Total Protein ELP: 6 g/dL (ref 6.0–8.5)

## 2021-03-26 DIAGNOSIS — Z23 Encounter for immunization: Secondary | ICD-10-CM | POA: Diagnosis not present

## 2021-04-03 ENCOUNTER — Other Ambulatory Visit: Payer: Self-pay | Admitting: Oncology

## 2021-04-07 ENCOUNTER — Other Ambulatory Visit: Payer: Self-pay | Admitting: Oncology

## 2021-04-07 DIAGNOSIS — D472 Monoclonal gammopathy: Secondary | ICD-10-CM

## 2021-04-14 ENCOUNTER — Other Ambulatory Visit: Payer: Self-pay | Admitting: Oncology

## 2021-04-14 DIAGNOSIS — C9000 Multiple myeloma not having achieved remission: Secondary | ICD-10-CM

## 2021-04-17 ENCOUNTER — Inpatient Hospital Stay: Payer: Medicare Other | Attending: Oncology

## 2021-04-17 ENCOUNTER — Other Ambulatory Visit: Payer: Self-pay

## 2021-04-17 DIAGNOSIS — M109 Gout, unspecified: Secondary | ICD-10-CM | POA: Diagnosis not present

## 2021-04-17 DIAGNOSIS — Z7189 Other specified counseling: Secondary | ICD-10-CM

## 2021-04-17 DIAGNOSIS — N179 Acute kidney failure, unspecified: Secondary | ICD-10-CM

## 2021-04-17 DIAGNOSIS — C9000 Multiple myeloma not having achieved remission: Secondary | ICD-10-CM | POA: Insufficient documentation

## 2021-04-17 DIAGNOSIS — Z298 Encounter for other specified prophylactic measures: Secondary | ICD-10-CM | POA: Diagnosis not present

## 2021-04-17 DIAGNOSIS — Z79899 Other long term (current) drug therapy: Secondary | ICD-10-CM | POA: Insufficient documentation

## 2021-04-17 DIAGNOSIS — D649 Anemia, unspecified: Secondary | ICD-10-CM | POA: Diagnosis not present

## 2021-04-17 LAB — CMP (CANCER CENTER ONLY)
ALT: 7 U/L (ref 0–44)
AST: 14 U/L — ABNORMAL LOW (ref 15–41)
Albumin: 3.1 g/dL — ABNORMAL LOW (ref 3.5–5.0)
Alkaline Phosphatase: 61 U/L (ref 38–126)
Anion gap: 9 (ref 5–15)
BUN: 20 mg/dL (ref 8–23)
CO2: 27 mmol/L (ref 22–32)
Calcium: 8.6 mg/dL — ABNORMAL LOW (ref 8.9–10.3)
Chloride: 104 mmol/L (ref 98–111)
Creatinine: 1.1 mg/dL (ref 0.61–1.24)
GFR, Estimated: >60 mL/min (ref >60)
Glucose, Bld: 112 mg/dL — ABNORMAL HIGH (ref 70–99)
Potassium: 3.6 mmol/L (ref 3.5–5.1)
Sodium: 140 mmol/L (ref 135–145)
Total Bilirubin: 0.6 mg/dL (ref 0.3–1.2)
Total Protein: 6.3 g/dL — ABNORMAL LOW (ref 6.5–8.1)

## 2021-04-17 LAB — RETICULOCYTES
Immature Retic Fract: 10.5 % (ref 2.3–15.9)
RBC.: 3.48 MIL/uL — ABNORMAL LOW (ref 4.22–5.81)
Retic Count, Absolute: 32.7 10*3/uL (ref 19.0–186.0)
Retic Ct Pct: 0.9 % (ref 0.4–3.1)

## 2021-04-17 LAB — VITAMIN B12: Vitamin B-12: 582 pg/mL (ref 180–914)

## 2021-04-17 LAB — CBC WITH DIFFERENTIAL (CANCER CENTER ONLY)
Abs Immature Granulocytes: 0.01 10*3/uL (ref 0.00–0.07)
Basophils Absolute: 0 10*3/uL (ref 0.0–0.1)
Basophils Relative: 0 %
Eosinophils Absolute: 0.4 10*3/uL (ref 0.0–0.5)
Eosinophils Relative: 9 %
HCT: 28.5 % — ABNORMAL LOW (ref 39.0–52.0)
Hemoglobin: 9 g/dL — ABNORMAL LOW (ref 13.0–17.0)
Immature Granulocytes: 0 %
Lymphocytes Relative: 19 %
Lymphs Abs: 0.8 10*3/uL (ref 0.7–4.0)
MCH: 25.9 pg — ABNORMAL LOW (ref 26.0–34.0)
MCHC: 31.6 g/dL (ref 30.0–36.0)
MCV: 82.1 fL (ref 80.0–100.0)
Monocytes Absolute: 0.6 10*3/uL (ref 0.1–1.0)
Monocytes Relative: 15 %
Neutro Abs: 2.2 10*3/uL (ref 1.7–7.7)
Neutrophils Relative %: 57 %
Platelet Count: 156 10*3/uL (ref 150–400)
RBC: 3.47 MIL/uL — ABNORMAL LOW (ref 4.22–5.81)
RDW: 15.4 % (ref 11.5–15.5)
WBC Count: 4 10*3/uL (ref 4.0–10.5)
nRBC: 0 % (ref 0.0–0.2)

## 2021-04-17 LAB — IRON AND TIBC
Iron: 89 ug/dL (ref 42–163)
Saturation Ratios: 36 % (ref 20–55)
TIBC: 245 ug/dL (ref 202–409)
UIBC: 156 ug/dL (ref 117–376)

## 2021-04-17 LAB — FOLATE: Folate: 8.2 ng/mL (ref >5.9)

## 2021-04-17 LAB — FERRITIN: Ferritin: 340 ng/mL — ABNORMAL HIGH (ref 24–336)

## 2021-04-18 LAB — KAPPA/LAMBDA LIGHT CHAINS
Kappa free light chain: 49.8 mg/L — ABNORMAL HIGH (ref 3.3–19.4)
Kappa, lambda light chain ratio: 2.57 — ABNORMAL HIGH (ref 0.26–1.65)
Lambda free light chains: 19.4 mg/L (ref 5.7–26.3)

## 2021-04-21 ENCOUNTER — Other Ambulatory Visit: Payer: Self-pay | Admitting: *Deleted

## 2021-04-21 DIAGNOSIS — C9000 Multiple myeloma not having achieved remission: Secondary | ICD-10-CM

## 2021-04-21 MED ORDER — LENALIDOMIDE 10 MG PO CAPS: ORAL_CAPSULE | ORAL | 0 refills | Status: DC

## 2021-04-22 LAB — MULTIPLE MYELOMA PANEL, SERUM
Albumin SerPl Elph-Mcnc: 3.1 g/dL (ref 2.9–4.4)
Albumin/Glob SerPl: 1.2 (ref 0.7–1.7)
Alpha 1: 0.3 g/dL (ref 0.0–0.4)
Alpha2 Glob SerPl Elph-Mcnc: 0.7 g/dL (ref 0.4–1.0)
B-Globulin SerPl Elph-Mcnc: 0.8 g/dL (ref 0.7–1.3)
Gamma Glob SerPl Elph-Mcnc: 1 g/dL (ref 0.4–1.8)
Globulin, Total: 2.8 g/dL (ref 2.2–3.9)
IgA: 135 mg/dL (ref 61–437)
IgG (Immunoglobin G), Serum: 1040 mg/dL (ref 603–1613)
IgM (Immunoglobulin M), Srm: 25 mg/dL (ref 15–143)
Total Protein ELP: 5.9 g/dL — ABNORMAL LOW (ref 6.0–8.5)

## 2021-04-24 DIAGNOSIS — M25476 Effusion, unspecified foot: Secondary | ICD-10-CM | POA: Diagnosis not present

## 2021-05-01 DIAGNOSIS — C439 Malignant melanoma of skin, unspecified: Secondary | ICD-10-CM | POA: Diagnosis not present

## 2021-05-01 DIAGNOSIS — I10 Essential (primary) hypertension: Secondary | ICD-10-CM | POA: Diagnosis not present

## 2021-05-01 DIAGNOSIS — M1A09X0 Idiopathic chronic gout, multiple sites, without tophus (tophi): Secondary | ICD-10-CM | POA: Diagnosis not present

## 2021-05-01 DIAGNOSIS — E785 Hyperlipidemia, unspecified: Secondary | ICD-10-CM | POA: Diagnosis not present

## 2021-05-09 ENCOUNTER — Other Ambulatory Visit: Payer: Self-pay | Admitting: Oncology

## 2021-05-09 DIAGNOSIS — D472 Monoclonal gammopathy: Secondary | ICD-10-CM

## 2021-05-14 ENCOUNTER — Other Ambulatory Visit: Payer: Self-pay | Admitting: Oncology

## 2021-05-14 DIAGNOSIS — C9000 Multiple myeloma not having achieved remission: Secondary | ICD-10-CM

## 2021-05-16 IMAGING — CT CT ABD-PELV W/O CM
2 of 4 series · 16 of 46 positions shown, 18 images · non-contrast
Comparison: 12/28/2003

CLINICAL DATA: Left abdominal pain, back pain x1 week

EXAM:
CT ABDOMEN AND PELVIS WITHOUT CONTRAST
TECHNIQUE: Multidetector CT imaging of the abdomen and pelvis was performed
following the standard protocol without IV contrast.

[Series 3: a/p w/o 5mm · axial · non-contrast · 0.81mm/px · z∈[+930,+1430]mm · 13 of 110 slices shown, 15 images]
[im 5/110  soft-tissue]
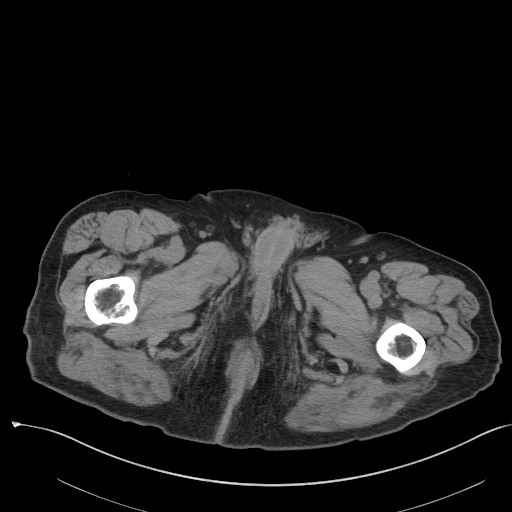
[im 5/110  bone]
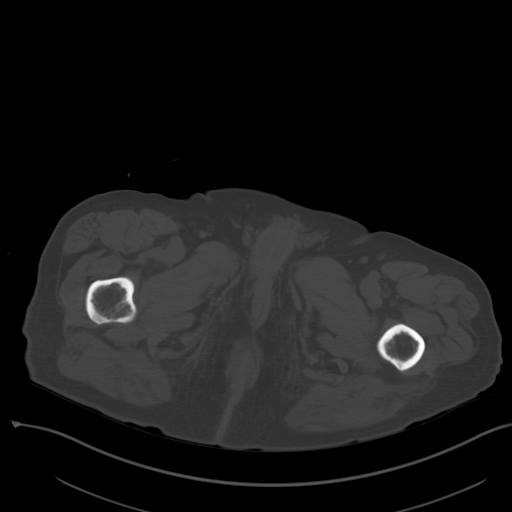
[im 14/110  soft-tissue]
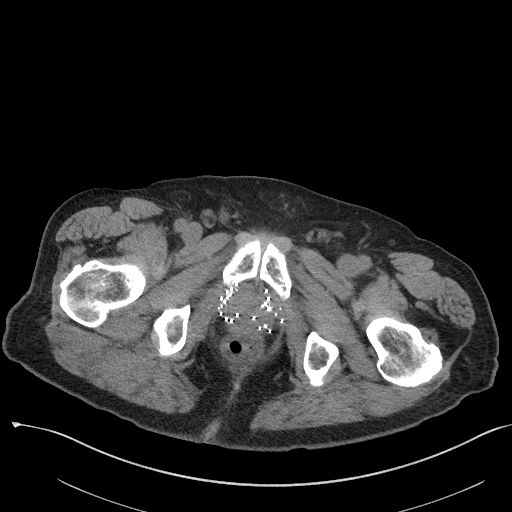
[im 23/110  soft-tissue]
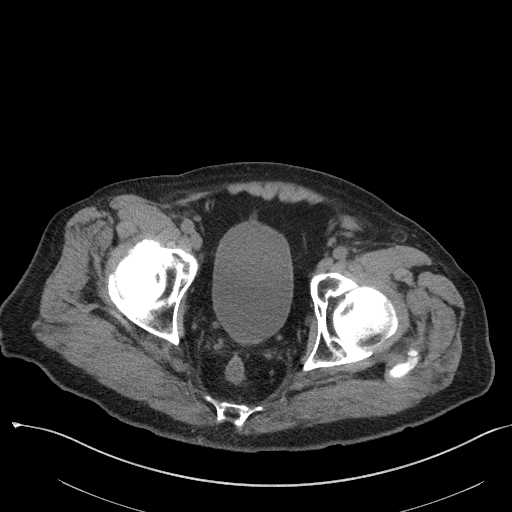
[im 32/110  soft-tissue]
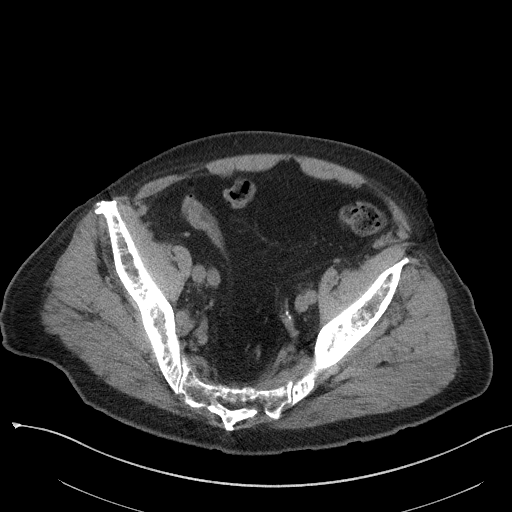
[im 37/110  soft-tissue]
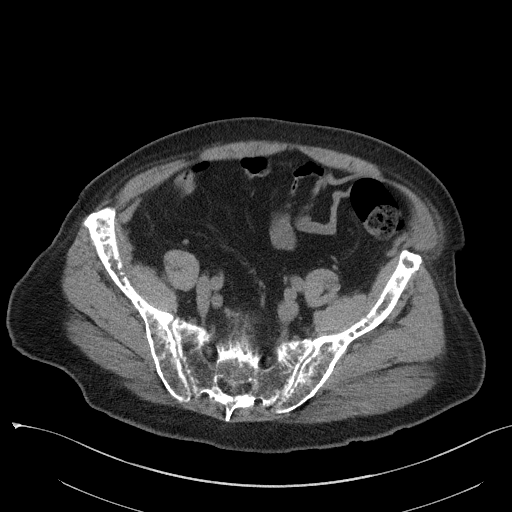
[im 46/110  soft-tissue]
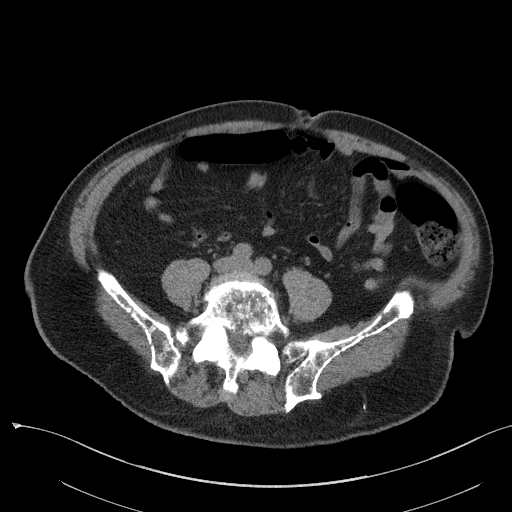
[im 55/110  soft-tissue]
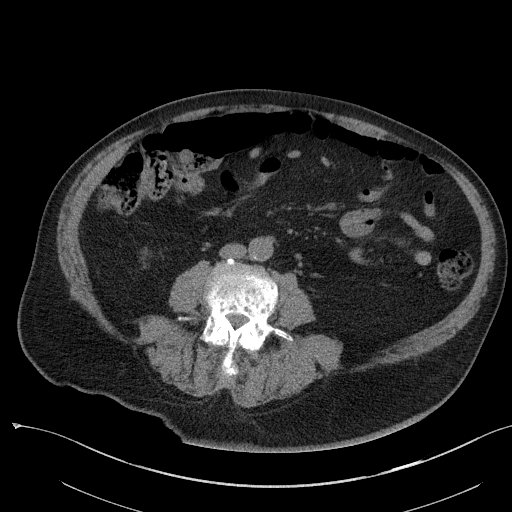
[im 64/110  soft-tissue]
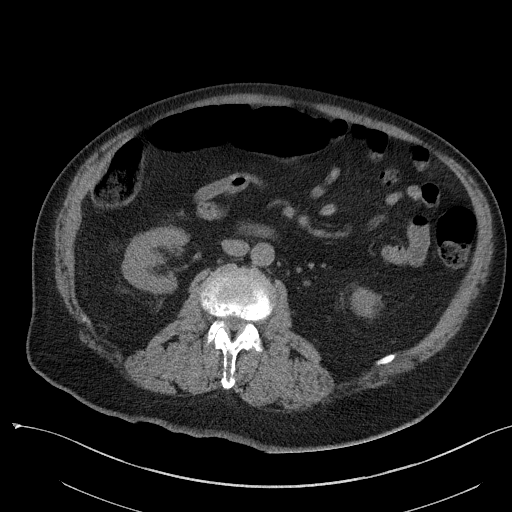
[im 73/110  soft-tissue]
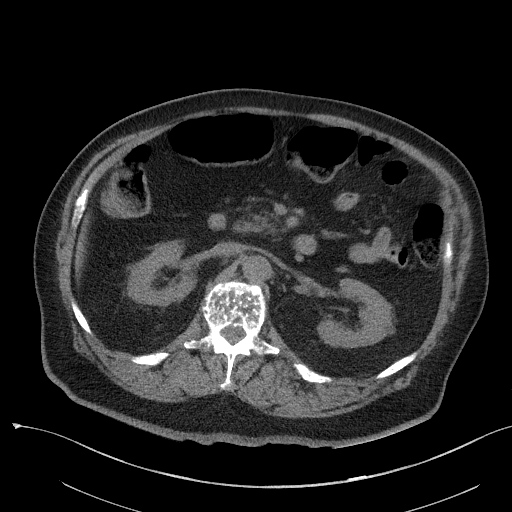
[im 73/110  bone]
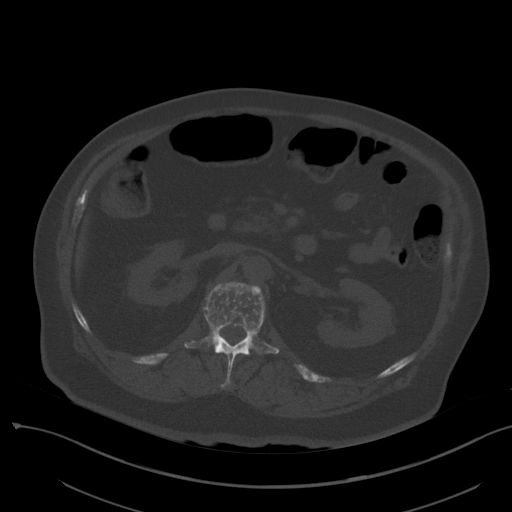
[im 78/110  soft-tissue]
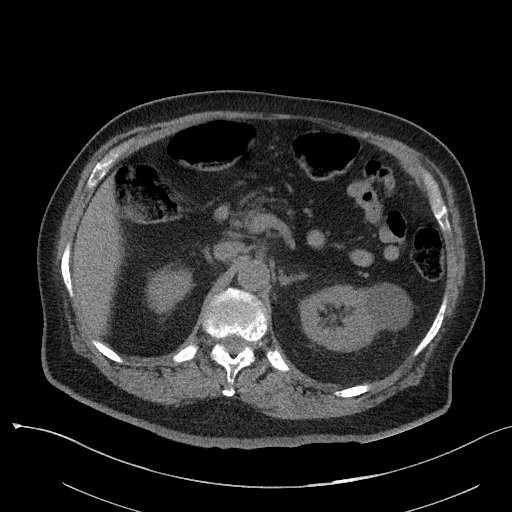
[im 87/110  soft-tissue]
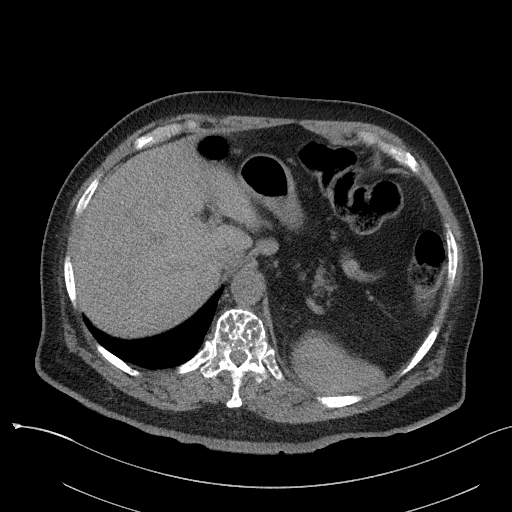
[im 96/110  soft-tissue]
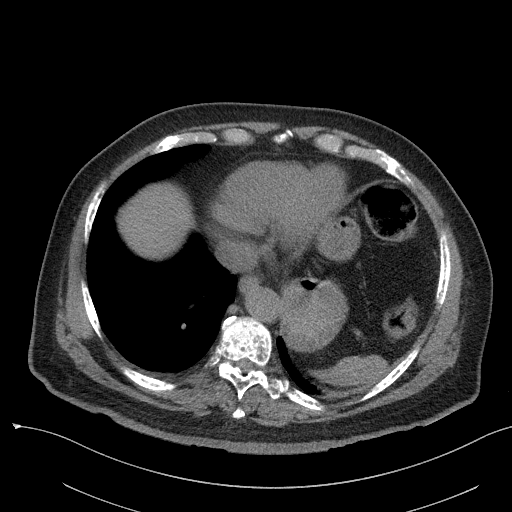
[im 105/110  soft-tissue]
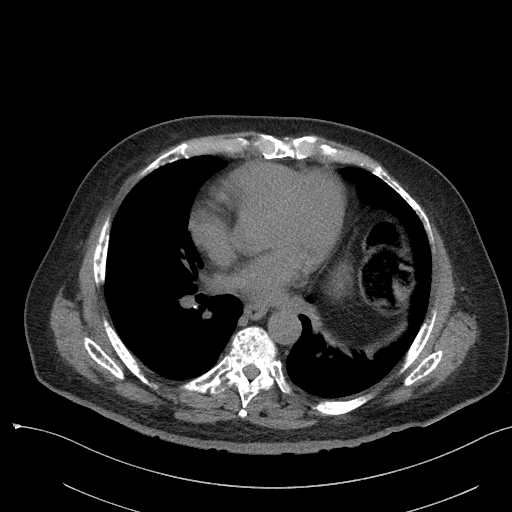

[Series 6: a/p w/o cor · coronal · non-contrast · 0.86mm/px · 3 of 151 slices shown]
[im 51/151  soft-tissue]
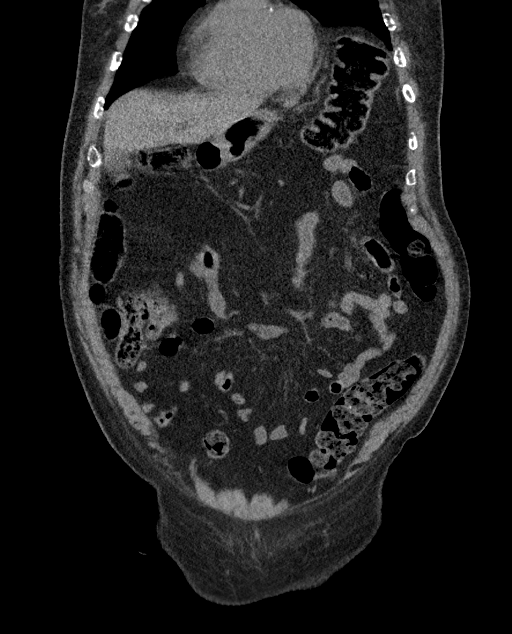
[im 67/151  soft-tissue]
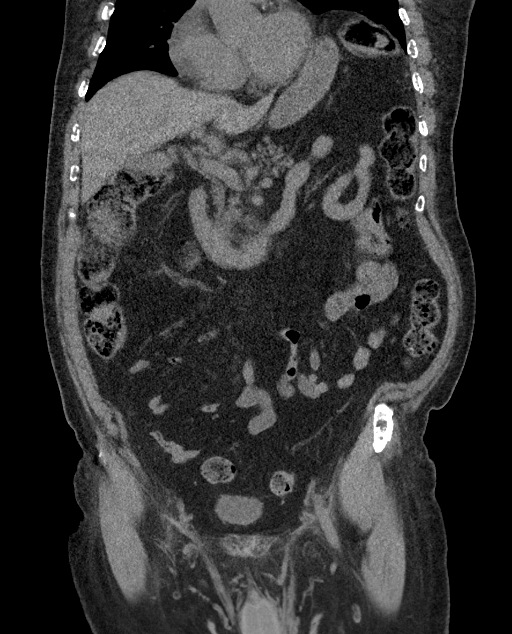
[im 84/151  soft-tissue]
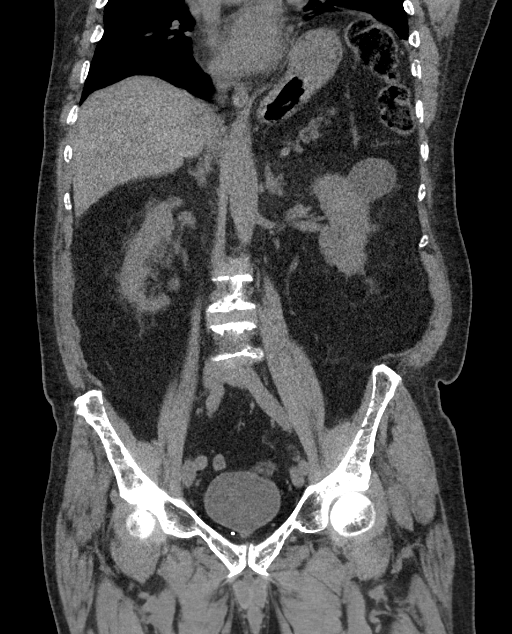

[16 of 46 positions shown; findings below may reference images not displayed]

FINDINGS: Lower chest: Coronary calcifications. No pleural or pericardial
effusion.

Hepatobiliary: No focal liver abnormality is seen. No gallstones,
gallbladder wall thickening, or biliary dilatation.

Pancreas: Unremarkable. No pancreatic ductal dilatation or
surrounding inflammatory changes.

Spleen: Normal in size without focal abnormality.

Adrenals/Urinary Tract: 4.3 cm left upper pole renal cyst. No
hydronephrosis. Adrenal glands unremarkable. Urinary bladder
incompletely distended.

Stomach/Bowel: Stomach is within normal limits. Appendix not
identified. No evidence of bowel wall thickening, distention, or
inflammatory changes.

Vascular/Lymphatic: Minimal aortoiliac atherosclerosis ([id number]-170.0)
without aneurysm. No abdominal or pelvic adenopathy.

Reproductive: Multiple metallic seeds around the prostate.

Other: No ascites.  No free air.

Musculoskeletal: Innumerable small lytic lesions throughout all
visualized bones. Mild T12 compression deformity, age indeterminate.
IMPRESSION: 1. Innumerable lytic lesions involving all visualized bones
suggesting metastatic disease or multiple myeloma.

## 2021-05-16 IMAGING — US US RENAL
1 series · 14 of 25 positions shown · non-contrast
Comparison: None.

CLINICAL DATA: Valle, Brij flank pain for 1 week

EXAM:
RENAL / URINARY TRACT ULTRASOUND COMPLETE

[Series 1: us renal · 14 of 33 slices shown]
[im 1/33]
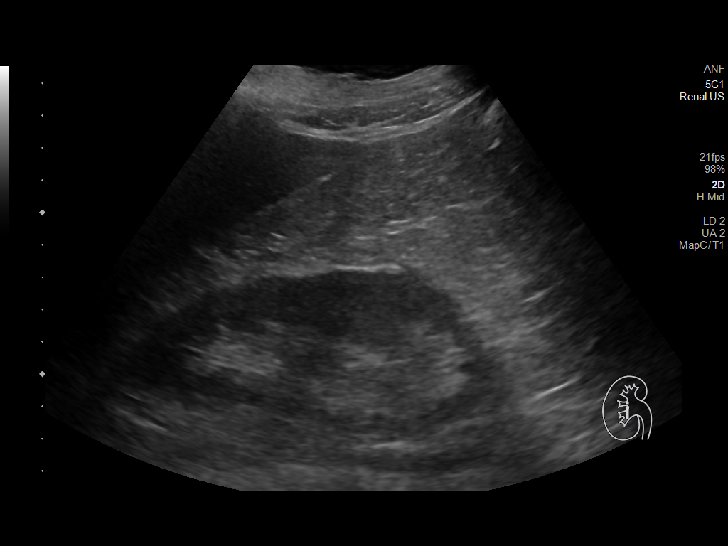
[im 3/33]
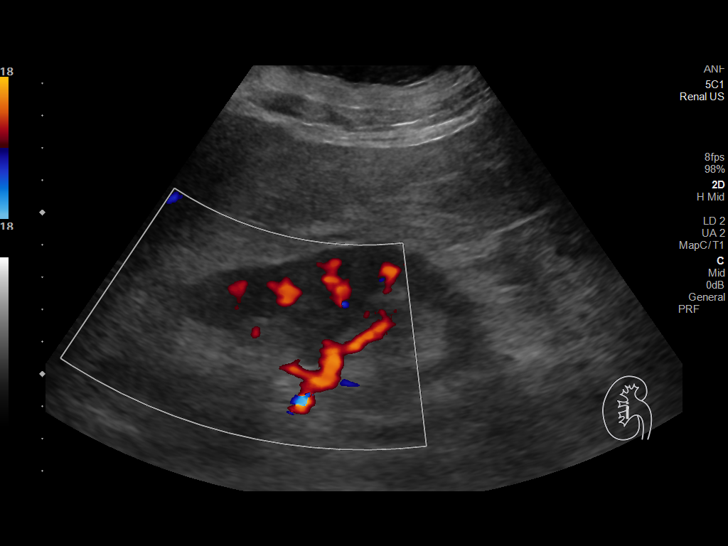
[im 6/33]
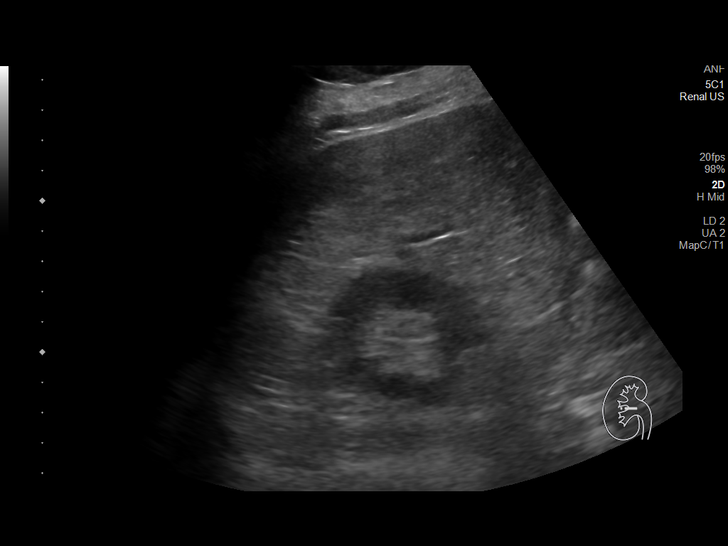
[im 9/33]
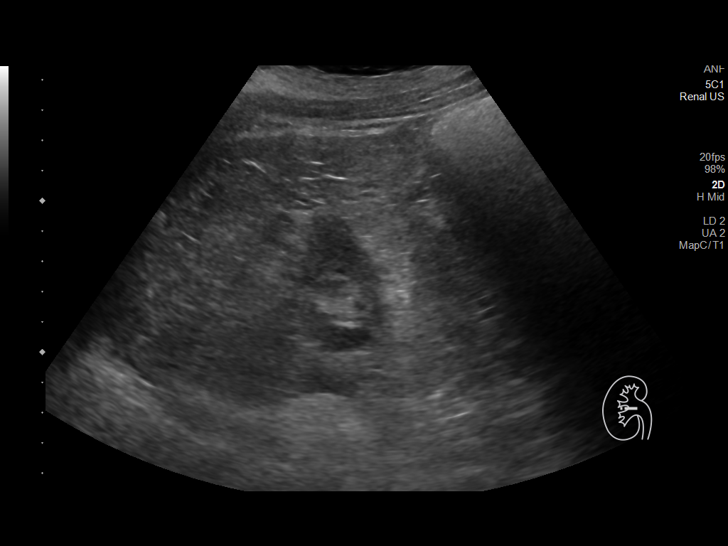
[im 11/33]
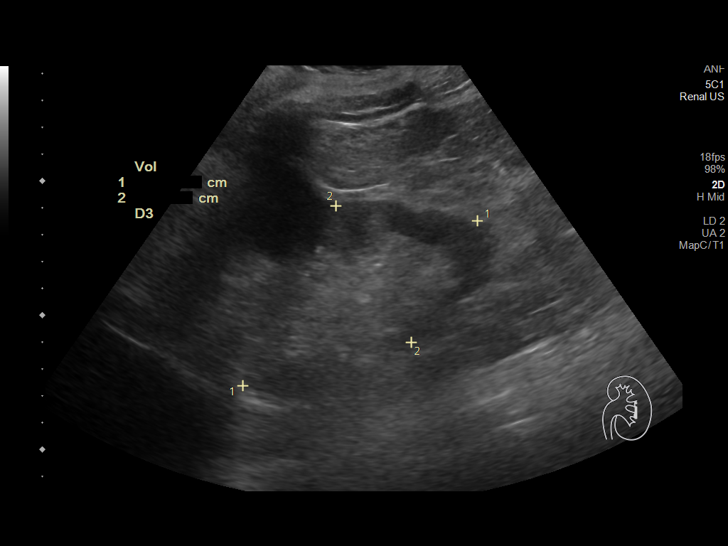
[im 13/33]
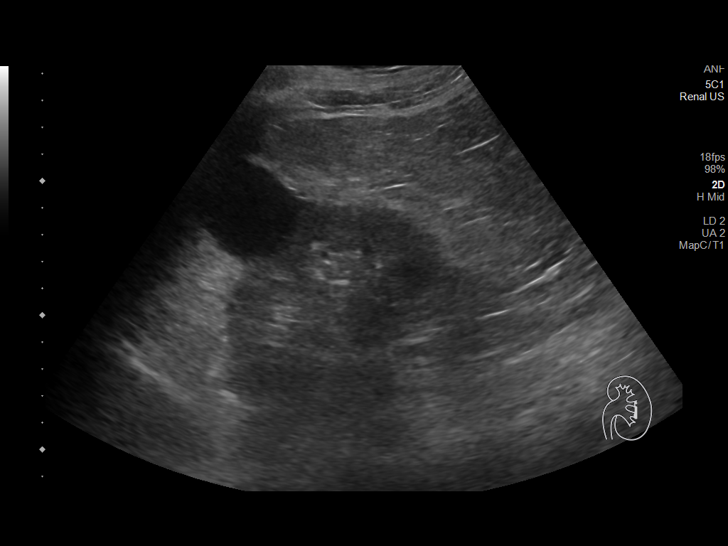
[im 15/33]
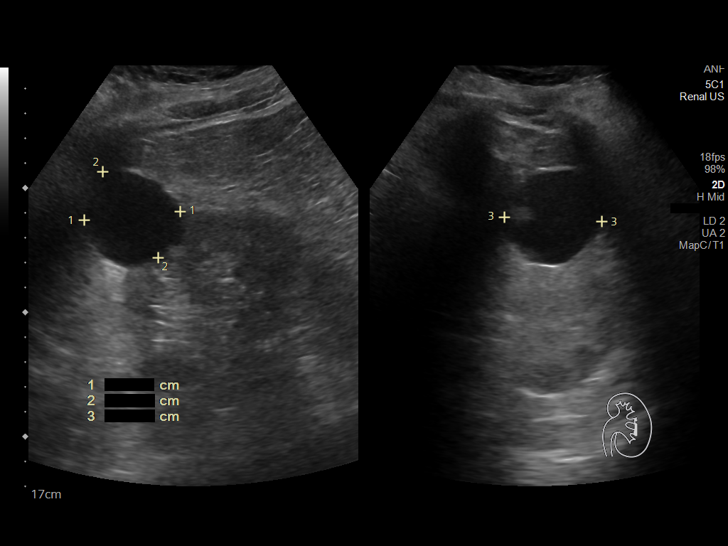
[im 18/33]
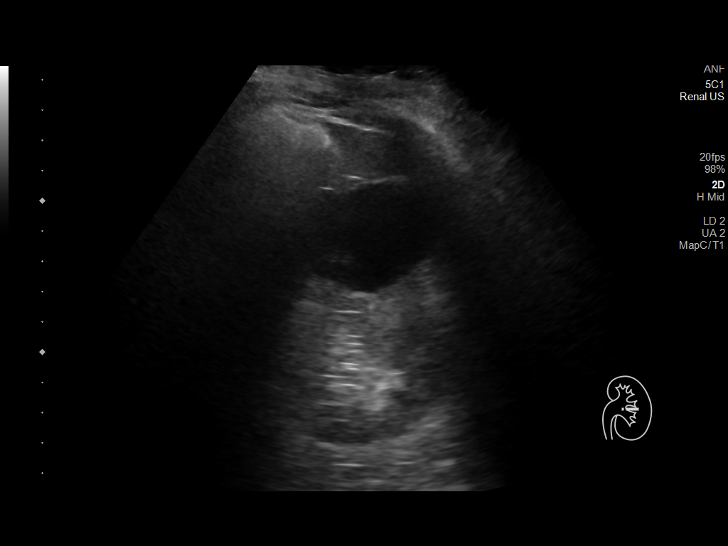
[im 21/33]
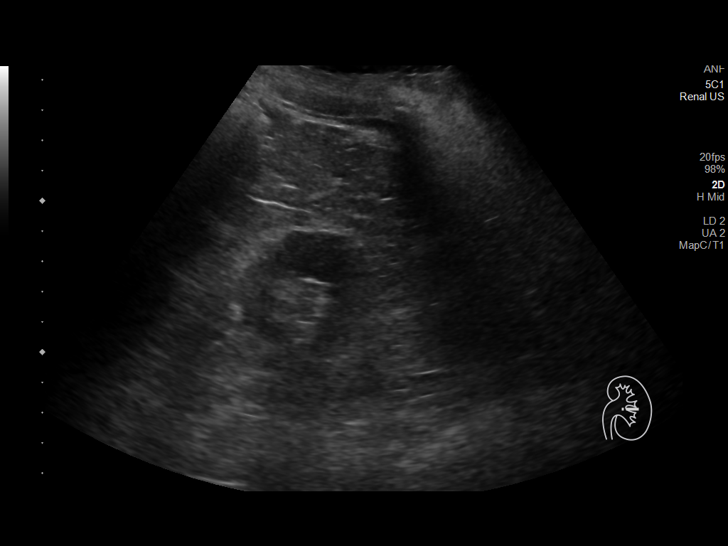
[im 22/33]
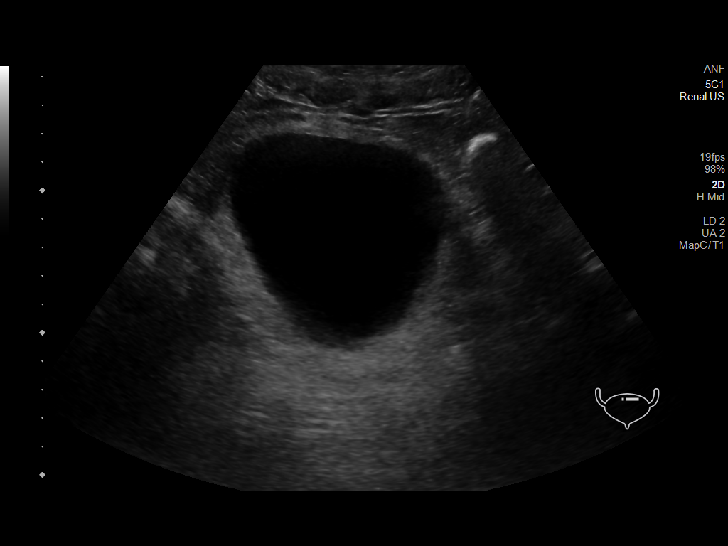
[im 25/33]
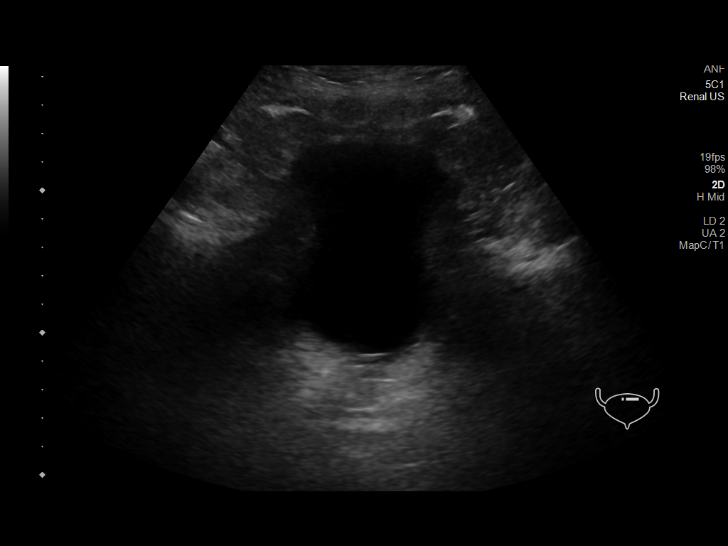
[im 27/33]
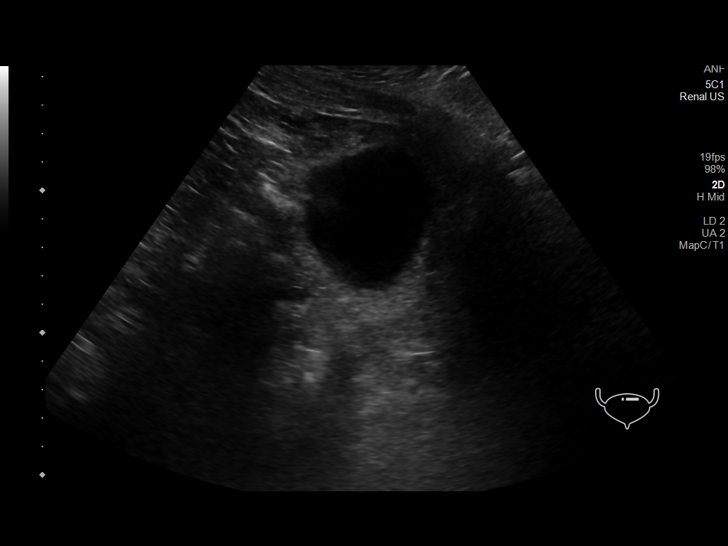
[im 30/33]
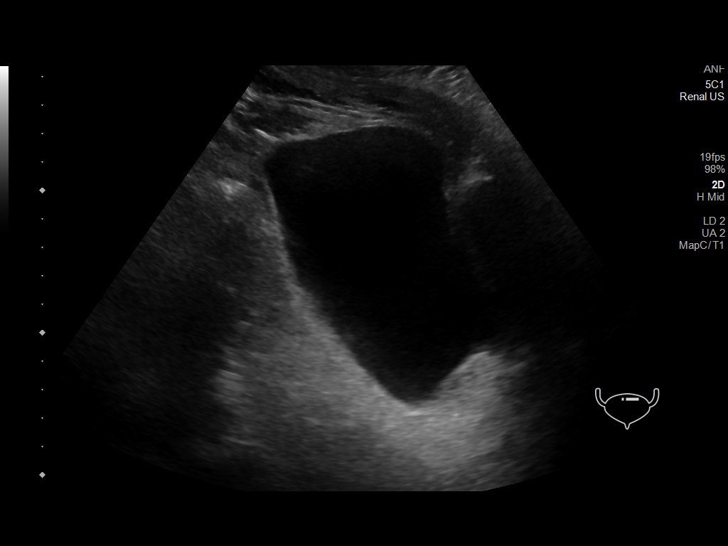
[im 33/33]
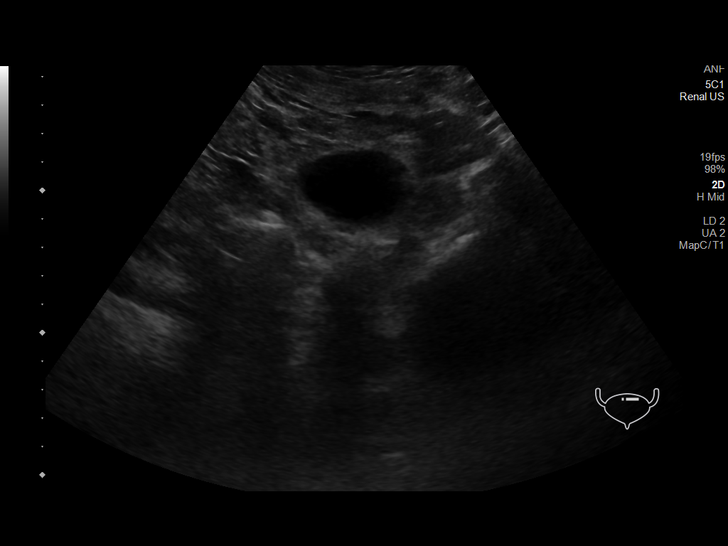

[14 of 25 positions shown; findings below may reference images not displayed]

FINDINGS: Right Kidney:

Renal measurements: 11.8 x 4.8 x 4.9 cm = volume: 144 mL .
Echogenicity within normal limits. No mass or hydronephrosis
visualized.

Left Kidney:

Renal measurements: 10.7 x 5.8 x 5.7 cm = volume: 187 mL.
Echogenicity within normal limits. Exophytic cyst measuring 4.1 cm.
No mass or hydronephrosis visualized.

Bladder:

Appears normal for degree of bladder distention.

Other:

None.
IMPRESSION: No ultrasound abnormality to explain left flank pain. No
hydronephrosis. Exophytic cyst of the left kidney measuring 4 cm, in
general unlikely to be symptomatic given modest size.

## 2021-05-26 ENCOUNTER — Other Ambulatory Visit: Payer: Self-pay | Admitting: Oncology

## 2021-05-28 ENCOUNTER — Other Ambulatory Visit: Payer: Self-pay | Admitting: *Deleted

## 2021-05-28 DIAGNOSIS — C9000 Multiple myeloma not having achieved remission: Secondary | ICD-10-CM

## 2021-05-28 MED ORDER — LENALIDOMIDE 10 MG PO CAPS: ORAL_CAPSULE | ORAL | 0 refills | Status: DC

## 2021-06-09 ENCOUNTER — Other Ambulatory Visit: Payer: Self-pay | Admitting: Adult Health

## 2021-06-09 ENCOUNTER — Inpatient Hospital Stay: Payer: Medicare Other | Attending: Oncology

## 2021-06-09 ENCOUNTER — Other Ambulatory Visit: Payer: Self-pay

## 2021-06-09 DIAGNOSIS — D472 Monoclonal gammopathy: Secondary | ICD-10-CM

## 2021-06-09 DIAGNOSIS — D649 Anemia, unspecified: Secondary | ICD-10-CM | POA: Diagnosis not present

## 2021-06-09 DIAGNOSIS — M109 Gout, unspecified: Secondary | ICD-10-CM | POA: Insufficient documentation

## 2021-06-09 DIAGNOSIS — Z79899 Other long term (current) drug therapy: Secondary | ICD-10-CM | POA: Insufficient documentation

## 2021-06-09 DIAGNOSIS — N179 Acute kidney failure, unspecified: Secondary | ICD-10-CM

## 2021-06-09 DIAGNOSIS — C9000 Multiple myeloma not having achieved remission: Secondary | ICD-10-CM

## 2021-06-09 DIAGNOSIS — Z7189 Other specified counseling: Secondary | ICD-10-CM

## 2021-06-09 LAB — CMP (CANCER CENTER ONLY)
ALT: 10 U/L (ref 0–44)
AST: 16 U/L (ref 15–41)
Albumin: 3.4 g/dL — ABNORMAL LOW (ref 3.5–5.0)
Alkaline Phosphatase: 57 U/L (ref 38–126)
Anion gap: 7 (ref 5–15)
BUN: 16 mg/dL (ref 8–23)
CO2: 28 mmol/L (ref 22–32)
Calcium: 9.3 mg/dL (ref 8.9–10.3)
Chloride: 103 mmol/L (ref 98–111)
Creatinine: 1.02 mg/dL (ref 0.61–1.24)
GFR, Estimated: >60 mL/min (ref >60)
Glucose, Bld: 105 mg/dL — ABNORMAL HIGH (ref 70–99)
Potassium: 3.8 mmol/L (ref 3.5–5.1)
Sodium: 138 mmol/L (ref 135–145)
Total Bilirubin: 0.6 mg/dL (ref 0.3–1.2)
Total Protein: 6.6 g/dL (ref 6.5–8.1)

## 2021-06-09 LAB — CBC WITH DIFFERENTIAL (CANCER CENTER ONLY)
Abs Immature Granulocytes: 0.01 10*3/uL (ref 0.00–0.07)
Basophils Absolute: 0 10*3/uL (ref 0.0–0.1)
Basophils Relative: 1 %
Eosinophils Absolute: 0.2 10*3/uL (ref 0.0–0.5)
Eosinophils Relative: 7 %
HCT: 33.1 % — ABNORMAL LOW (ref 39.0–52.0)
Hemoglobin: 10.4 g/dL — ABNORMAL LOW (ref 13.0–17.0)
Immature Granulocytes: 0 %
Lymphocytes Relative: 28 %
Lymphs Abs: 0.9 10*3/uL (ref 0.7–4.0)
MCH: 25.8 pg — ABNORMAL LOW (ref 26.0–34.0)
MCHC: 31.4 g/dL (ref 30.0–36.0)
MCV: 82.1 fL (ref 80.0–100.0)
Monocytes Absolute: 0.3 10*3/uL (ref 0.1–1.0)
Monocytes Relative: 8 %
Neutro Abs: 1.7 10*3/uL (ref 1.7–7.7)
Neutrophils Relative %: 56 %
Platelet Count: 112 10*3/uL — ABNORMAL LOW (ref 150–400)
RBC: 4.03 MIL/uL — ABNORMAL LOW (ref 4.22–5.81)
RDW: 13.6 % (ref 11.5–15.5)
WBC Count: 3.1 10*3/uL — ABNORMAL LOW (ref 4.0–10.5)
nRBC: 0 % (ref 0.0–0.2)

## 2021-06-10 LAB — KAPPA/LAMBDA LIGHT CHAINS
Kappa free light chain: 25.1 mg/L — ABNORMAL HIGH (ref 3.3–19.4)
Kappa, lambda light chain ratio: 1.53 (ref 0.26–1.65)
Lambda free light chains: 16.4 mg/L (ref 5.7–26.3)

## 2021-06-11 NOTE — Progress Notes (Signed)
Joe [name]  Telephone:([phone] Fax:(336) [phone]     ID: [name] DOB: [birth year]  MR#: [id number]  JSE#:[id number]  Patient Care Team: [name], MD as PCP - General (Internal Medicine) Josip Merolla, Virgie Dad, MD as Consulting Physician (Oncology) Bonnee Quin Curt Bears OTHER MD:  CHIEF COMPLAINT: multiple myeloma  CURRENT TREATMENT: denosumab Xgeva every 12 weeks; lenalidomide   INTERVAL HISTORY: Joe [name] returns today for follow up and treatment of his multiple myeloma.  He tells me he is generally doing well, and has been doing some yard work.  He continues on lenalidomide, 58m daily 21 days on and 7 day off.  He tolerates this remarkably well, with minimal constipation, no fatigue or nausea, and no sleepiness.  We are following chiefly his M spike;  Results for FMALIKIAH, DEBARR(MRN [id number] as of 06/12/2021 10:56  Ref. Range 10/30/2020 10:41 12/04/2020 12:55 02/06/2021 11:16 03/20/2021 11:01 04/17/2021 10:40  M Protein SerPl Elph-Mcnc Latest Ref Range: Not Observed g/dL 0.3 (H) Not Observed Not Observed Not Observed Not Observed     The kappa lambda ratio has normalized Lab Results  Component Value Date   KAPLAMBRATIO 1.53 06/09/2021   KAPLAMBRATIO 2.57 (H) 04/17/2021   KAPLAMBRATIO 2.07 (H) 03/20/2021   KAPLAMBRATIO 1.79 (H) 02/06/2021   KAPLAMBRATIO 2.63 (H) 12/04/2020   Also he receives denosumab/Xgeva every 12 weeks, most recently given on 03/26/2021.  He has a dose due today   REVIEW OF SYSTEMS: LKirillhas a little bit of low back pain sometimes, when he bends over or does a lot of yard work.  It does not run down his legs.  It is not constant or intense.  He is very concerned about his wife who he says is confused, sees things, and very forgetful.  A detailed review of systems was otherwise stable.  COVID 19 VACCINATION STATUS: He is received Pfizer x2 with booster October 2021 He will receive evusheld in 4 weeks at the  updated dosage of 3056m     HISTORY OF CURRENT ILLNESS: From the original intake note:  Mr. Joe [name] an 87 1/o [place] formerly followed by my partner Joe [name] M-GUS. We have an M-Spike of 0.87 documented 07/12/2012. Joe [name] were follower yearly until 01/2016, when they were 2.36 and 1517 respectively (unremarkable). He was lost to follow-up after that point.  Yesterday [02/29/2020] the patient presented to Urgent Care with poorly controlled pain. This was felt to be mussculoskeletal and he was started on a medrol dose-pak and robaxin. However his pain worsened and [facility] but labs showed a creatinine of 1.84 (baseline 1.0), calcium 12.7 with albumin 2.8 and total protein 10.4 (globulin fraction 7.8). CT of the abdomen obtained for evaluation of his abdominal and back pain showed no hydronephrosis but multiple lytic lesons. We were consulted for further evaluation and treatment of likely multiple myeloma and he was admitted to the hospital.    The patient's subsequent history is as detailed below.   PAST MEDICAL HISTORY: Past Medical History:  Diagnosis Date   Enlarged prostate    Hypertension    [facility]    PAST SURGICAL HISTORY: Past Surgical History:  Procedure Laterality Date   EYE SURGERY     PROSTATE SURGERY      FAMILY HISTORY Family History  Problem Relation Age of Onset   Hypertension Mother  SOCIAL HISTORY:  Retired, used to work for the [employer] Lives with wife [place]. Has 4 children, 2 in [place], one in [place], one in [place]; 5 grandchildren and 5 great-grandchildren. Attends a [employer].    ADVANCED DIRECTIVES: at the initial visit the patient confirmed his wife is his HCPOA (despite her memory issues).   HEALTH MAINTENANCE: Social History   Tobacco Use   Smoking status: Never    Smokeless tobacco: Never  Vaping Use   Vaping Use: Never used  Substance Use Topics   Alcohol use: Never   Drug use: Never     Colonoscopy: 6-8 years ago     No Known Allergies  Current Outpatient Medications  Medication Sig Dispense Refill   acetaminophen (TYLENOL) 500 MG tablet Take 1,000 mg by mouth every 6 (six) hours as needed for mild pain.     acyclovir (ZOVIRAX) 400 MG tablet Take 1 tablet by mouth twice daily 60 tablet 0   amLODipine (NORVASC) 5 MG tablet Take 5 mg by mouth daily.     aspirin EC 81 MG tablet Take 81 mg by mouth daily. Swallow whole.      calcium carbonate (TUMS - DOSED IN MG ELEMENTAL CALCIUM) 500 MG chewable tablet Chew 3 tablets by mouth daily.     COVID-19 mRNA vaccine, Pfizer, 30 MCG/0.3ML injection INJECT AS DIRECTED .3 mL 0   doxazosin (CARDURA) 4 MG tablet Take 4 mg by mouth at bedtime.     doxazosin (CARDURA) 8 MG tablet Take 4 mg by mouth daily.     famotidine (PEPCID) 20 MG tablet Take 1 tablet by mouth once daily 90 tablet 0   finasteride (PROSCAR) 5 MG tablet Take 1 tablet by mouth once daily 30 tablet 0   gabapentin (NEURONTIN) 100 MG capsule Take 1 capsule (100 mg total) by mouth at bedtime. 90 capsule 4   indapamide (LOZOL) 2.5 MG tablet Take 2.5 mg by mouth daily.      lenalidomide (REVLIMID) 10 MG capsule Take 1 tablet daily for 21 days then 7 days off every 28 days Adult male [place] # obtained 04/18/2021 [id number] 21 capsule 0   magnesium citrate SOLN Take 1/2 bottle if no bowel movement in 2 hours repeat 195 mL 1   ondansetron (ZOFRAN) 8 MG tablet TAKE 1 TABLET BY MOUTH THREE TIMES DAILY AS NEEDED FOR NAUSEA OR VOMITING 20 tablet 0   polyethylene glycol powder (MIRALAX) 17 GM/SCOOP powder Take 255 g by mouth daily. 255 g 0   potassium chloride (KLOR-CON) 10 MEQ tablet Take 2 tablets (20 mEq total) by mouth daily. 180 tablet 2   pravastatin (PRAVACHOL) 40 MG tablet Take 40 mg by mouth daily.     prochlorperazine (COMPAZINE) 10 MG tablet  Take 1 tablet (10 mg total) by mouth every 6 (six) hours as needed (Nausea or vomiting). 30 tablet 1   senna-docusate (SENOKOT-S) 8.6-50 MG tablet Take 2 tablets by mouth 2 (two) times daily. (Patient taking differently: Take 2 tablets by mouth 2 (two) times daily as needed. ) 120 tablet 0   tamsulosin (FLOMAX) 0.4 MG CAPS capsule TAKE 1 CAPSULE BY MOUTH ONCE DAILY AFTER SUPPER 30 capsule 3   traMADol (ULTRAM) 50 MG tablet TAKE 1 TABLET BY MOUTH EVERY 8 HOURS AS NEEDED. 30 tablet 0   vitamin B-12 (CYANOCOBALAMIN) 1000 MCG tablet Take 1,000 mcg by mouth daily.     No current facility-administered medications for this visit.    OBJECTIVE: African-American man  i who appears stated age There were no vitals filed for this visit.    There is no height or weight on file to calculate BMI.   Wt Readings from Last 3 Encounters:  03/20/21 181 lb 3.2 oz (82.2 kg)  02/06/21 183 lb 14.4 oz (83.4 kg)  12/26/20 186 lb (84.4 kg)  ECOG FS:1 - Symptomatic but completely ambulatory  Vitals today did not upload for some reason.  He weighed 180.3 pounds, is 5 feet 11 inches tall, blood pressure 126/65 on the left arm sitting, pulse rate 69 respiratory rate 18 and temperature 97.7.  Sclerae unicteric, EOMs intact Wearing a mask No cervical or supraclavicular adenopathy Lungs no rales or rhonchi Heart regular rate and rhythm Abd soft, nontender, positive bowel sounds MSK no focal spinal tenderness to careful palpation particularly in the lower back, no upper extremity lymphedema Neuro: nonfocal, well oriented, appropriate affect   LAB RESULTS:  CMP     Component Value Date/Time   NA 138 06/09/2021 1021   NA 140 01/09/2016 1045   K 3.8 06/09/2021 1021   K 3.8 01/09/2016 1045   CL 103 06/09/2021 1021   CO2 28 06/09/2021 1021   CO2 27 01/09/2016 1045   GLUCOSE 105 (H) 06/09/2021 1021   GLUCOSE 89 01/09/2016 1045   BUN 16 06/09/2021 1021   BUN 14.7 01/09/2016 1045   CREATININE 1.02 06/09/2021 1021    CREATININE 1.0 01/09/2016 1045   CALCIUM 9.3 06/09/2021 1021   CALCIUM 9.2 01/09/2016 1045   PROT 6.6 06/09/2021 1021   PROT 7.3 01/09/2016 1045   ALBUMIN 3.4 (L) 06/09/2021 1021   ALBUMIN 3.7 01/09/2016 1045   AST 16 06/09/2021 1021   AST 18 01/09/2016 1045   ALT 10 06/09/2021 1021   ALT 15 01/09/2016 1045   ALKPHOS 57 06/09/2021 1021   ALKPHOS 60 01/09/2016 1045   BILITOT 0.6 06/09/2021 1021   BILITOT 0.51 01/09/2016 1045   GFRNONAA >60 06/09/2021 1021   GFRAA >60 08/30/2020 1402    Lab Results  Component Value Date   TOTALPROTELP 5.9 (L) 04/17/2021   ALBUMINELP 56.1 07/12/2012   A1GS 3.5 07/12/2012   A2GS 9.8 07/12/2012   BETS 5.1 07/12/2012   BETA2SER 3.6 07/12/2012   GAMS 21.9 (H) 07/12/2012   MSPIKE 0.87 07/12/2012   SPEI * 07/12/2012     Lab Results  Component Value Date   KPAFRELGTCHN 25.1 (H) 06/09/2021   LAMBDASER 16.4 06/09/2021   KAPLAMBRATIO 1.53 06/09/2021    Lab Results  Component Value Date   WBC 3.1 (L) 06/09/2021   NEUTROABS 1.7 06/09/2021   HGB 10.4 (L) 06/09/2021   HCT 33.1 (L) 06/09/2021   MCV 82.1 06/09/2021   PLT 112 (L) 06/09/2021      Chemistry      Component Value Date/Time   NA 138 06/09/2021 1021   NA 140 01/09/2016 1045   K 3.8 06/09/2021 1021   K 3.8 01/09/2016 1045   CL 103 06/09/2021 1021   CO2 28 06/09/2021 1021   CO2 27 01/09/2016 1045   BUN 16 06/09/2021 1021   BUN 14.7 01/09/2016 1045   CREATININE 1.02 06/09/2021 1021   CREATININE 1.0 01/09/2016 1045      Component Value Date/Time   CALCIUM 9.3 06/09/2021 1021   CALCIUM 9.2 01/09/2016 1045   ALKPHOS 57 06/09/2021 1021   ALKPHOS 60 01/09/2016 1045   AST 16 06/09/2021 1021   AST 18 01/09/2016 1045   ALT 10 06/09/2021 1021  ALT 15 01/09/2016 1045   BILITOT 0.6 06/09/2021 1021   BILITOT 0.51 01/09/2016 1045       No results found for: LABCA2  No components found for: KZLDJT701  No results for input(s): INR in the last 168 hours.  No results found  for: LABCA2  No results found for: XBL390  No results found for: ZES923  No results found for: RAQ762  No results found for: CA2729  No components found for: HGQUANT  No results found for: CEA1 / No results found for: CEA1   No results found for: AFPTUMOR  No results found for: CHROMOGRNA  No results found for: HGBA, HGBA2QUANT, HGBFQUANT, HGBSQUAN (Hemoglobinopathy evaluation)   Lab Results  Component Value Date   LDH 136 01/09/2016    Lab Results  Component Value Date   IRON 89 04/17/2021   TIBC 245 04/17/2021   IRONPCTSAT 36 04/17/2021   (Iron and TIBC)  Lab Results  Component Value Date   FERRITIN 340 (H) 04/17/2021    Urinalysis    Component Value Date/Time   COLORURINE AMBER (A) 03/01/2020 1052   APPEARANCEUR CLOUDY (A) 03/01/2020 1052   LABSPEC 1.021 03/01/2020 1052   PHURINE 5.0 03/01/2020 1052   GLUCOSEU NEGATIVE 03/01/2020 1052   HGBUR NEGATIVE 03/01/2020 1052   BILIRUBINUR NEGATIVE 03/01/2020 1052   KETONESUR NEGATIVE 03/01/2020 1052   PROTEINUR 100 (A) 03/01/2020 1052   UROBILINOGEN 0.2 02/29/2020 1537   NITRITE NEGATIVE 03/01/2020 [place] 03/01/2020 1052    STUDIES: No results found.   ELIGIBLE FOR AVAILABLE RESEARCH PROTOCOL:   ASSESSMENT: 87 y.o.  43 [place] man with a history of M-GUS, presenting 02/29/2020 with a high globulin fraction, worsening renal function, hypercalcemia, anemia and multiple lytic bone lesions.    (1) [name] Kappa Multiple Myeloma: Labs diagnostic on 03/01/2020 with Mspike of 3.8, Kappa free light chain 690.6, and ratio 117.05.  (a) CT A/P on 03/01/2020 shows "innumerable" lytic lesions involving all visualized bones  (b) bone marrow biopsy 03/11/2020 shows 20% plasmacytosis by CD138, 12% by manual differential; molecular studies not requested  (c) Bortezomib and Dexamethasone given weekly 3 weeks on and 1 week off beginning 03/11/2020, with good response, last dose 07/24/2020  (d) transitioned  to lenalidomide 10 mg daily 21/7 starting 07/31/2020    (2) lytic bone lesions/ hypercalcemia:  (a) Pamidronate administered on 03/01/2020  (b) denosumab/Xgeva started 04/17/2020-- repeat every 12 weeks   (a) to take TUMS 3 tabs on day of Xgeva dose   PLAN:  Mr. Cutshaw is now a little over a year out from initial diagnosis of multiple myeloma.  His disease is very well controlled, with no detectable M spike and now a normal kappa lambda ratio.  He is tolerating lenalidomide well at the current dose and we are making no changes there.  He also receives denosumab/Xgeva every 12 weeks, with a dose due today.  Accordingly we are continuing as before.  He we will see me again in 12 weeks for his next Xgeva dose.  He knows to call for any other issue that may develop before the next visit.  Total encounter time 25 minutes.Sarajane Jews C. Malina Geers, MD 06/11/21 4:04 PM Medical Oncology and Hematology [facility] [place], [place] [address] Tel. [phone]    Fax. [phone]   I, Wilburn Mylar, am acting as scribe for Joe. Virgie Dad. Onda Kattner.  Lindie Spruce MD, have reviewed the above documentation for accuracy and  completeness, and I agree with the above.   *Total Encounter Time as defined by the Centers for Medicare and Medicaid Services includes, in addition to the face-to-face time of a patient visit (documented in the note above) non-face-to-face time: obtaining and reviewing outside history, ordering and reviewing medications, tests or procedures, care coordination (communications with other health care professionals or caregivers) and documentation in the medical record.

## 2021-06-12 ENCOUNTER — Other Ambulatory Visit: Payer: Self-pay

## 2021-06-12 ENCOUNTER — Inpatient Hospital Stay (HOSPITAL_BASED_OUTPATIENT_CLINIC_OR_DEPARTMENT_OTHER): Payer: Medicare Other | Admitting: Oncology

## 2021-06-12 ENCOUNTER — Inpatient Hospital Stay: Payer: Medicare Other

## 2021-06-12 VITALS — BP 126/65 | HR 69 | Temp 97.7°F | Resp 18 | Ht 71.0 in | Wt 180.3 lb

## 2021-06-12 DIAGNOSIS — D649 Anemia, unspecified: Secondary | ICD-10-CM

## 2021-06-12 DIAGNOSIS — M109 Gout, unspecified: Secondary | ICD-10-CM | POA: Diagnosis not present

## 2021-06-12 DIAGNOSIS — C9000 Multiple myeloma not having achieved remission: Secondary | ICD-10-CM

## 2021-06-12 DIAGNOSIS — Z79899 Other long term (current) drug therapy: Secondary | ICD-10-CM | POA: Diagnosis not present

## 2021-06-12 DIAGNOSIS — Z7189 Other specified counseling: Secondary | ICD-10-CM

## 2021-06-12 MED ORDER — DENOSUMAB 120 MG/1.7ML ~~LOC~~ SOLN
120.0000 mg | Freq: Once | SUBCUTANEOUS | Status: AC
  Administered 2021-06-12: 120 mg via SUBCUTANEOUS

## 2021-06-12 MED ORDER — DENOSUMAB 120 MG/1.7ML ~~LOC~~ SOLN
SUBCUTANEOUS | Status: AC
  Filled 2021-06-12: qty 1.7

## 2021-06-12 MED ORDER — LENALIDOMIDE 10 MG PO CAPS: ORAL_CAPSULE | ORAL | 6 refills | Status: DC

## 2021-06-12 NOTE — Patient Instructions (Signed)
Denosumab injection What is this medication? DENOSUMAB (den oh sue mab) slows bone breakdown. Prolia is used to treat osteoporosis in women after menopause and in men, and in people who are taking corticosteroids for 6 months or more. Delton See is used to treat a high calcium level due to cancer and to prevent bone fractures and other bone problems caused by multiple myeloma or cancer bone metastases. Delton See is also used totreat giant cell tumor of the bone. This medicine may be used for other purposes; ask your health care provider orpharmacist if you have questions. COMMON BRAND NAME(S): Prolia, XGEVA What should I tell my care team before I take this medication? They need to know if you have any of these conditions: dental disease having surgery or tooth extraction infection kidney disease low levels of calcium or Vitamin D in the blood malnutrition on hemodialysis skin conditions or sensitivity thyroid or parathyroid disease an unusual reaction to denosumab, other medicines, foods, dyes, or preservatives pregnant or trying to get pregnant breast-feeding How should I use this medication? This medicine is for injection under the skin. It is given by a health careprofessional in a hospital or clinic setting. A special MedGuide will be given to you before each treatment. Be sure to readthis information carefully each time. For Prolia, talk to your pediatrician regarding the use of this medicine in children. Special care may be needed. For Delton See, talk to your pediatrician regarding the use of this medicine in children. While this drug may be prescribed for children as young as 13 years for selected conditions,precautions do apply. Overdosage: If you think you have taken too much of this medicine contact apoison control center or emergency room at once. NOTE: This medicine is only for you. Do not share this medicine with others. What if I miss a dose? It is important not to miss your dose. Call  your doctor or health careprofessional if you are unable to keep an appointment. What may interact with this medication? Do not take this medicine with any of the following medications: other medicines containing denosumab This medicine may also interact with the following medications: medicines that lower your chance of fighting infection steroid medicines like prednisone or cortisone This list may not describe all possible interactions. Give your health care provider a list of all the medicines, herbs, non-prescription drugs, or dietary supplements you use. Also tell them if you smoke, drink alcohol, or use illegaldrugs. Some items may interact with your medicine. What should I watch for while using this medication? Visit your doctor or health care professional for regular checks on your progress. Your doctor or health care professional may order blood tests andother tests to see how you are doing. Call your doctor or health care professional for advice if you get a fever, chills or sore throat, or other symptoms of a cold or flu. Do not treat yourself. This drug may decrease your body's ability to fight infection. Try toavoid being around people who are sick. You should make sure you get enough calcium and vitamin D while you are taking this medicine, unless your doctor tells you not to. Discuss the foods you eatand the vitamins you take with your health care professional. See your dentist regularly. Brush and floss your teeth as directed. Before youhave any dental work done, tell your dentist you are receiving this medicine. Do not become pregnant while taking this medicine or for 5 months after stopping it. Talk with your doctor or health care professional about your  birth control options while taking this medicine. Women should inform their doctor if they wish to become pregnant or think they might be pregnant. There is a potential for serious side effects to an unborn child. Talk to your health  careprofessional or pharmacist for more information. What side effects may I notice from receiving this medication? Side effects that you should report to your doctor or health care professionalas soon as possible: allergic reactions like skin rash, itching or hives, swelling of the face, lips, or tongue bone pain breathing problems dizziness jaw pain, especially after dental work redness, blistering, peeling of the skin signs and symptoms of infection like fever or chills; cough; sore throat; pain or trouble passing urine signs of low calcium like fast heartbeat, muscle cramps or muscle pain; pain, tingling, numbness in the hands or feet; seizures unusual bleeding or bruising unusually weak or tired Side effects that usually do not require medical attention (report to yourdoctor or health care professional if they continue or are bothersome): constipation diarrhea headache joint pain loss of appetite muscle pain runny nose tiredness upset stomach This list may not describe all possible side effects. Call your doctor for medical advice about side effects. You may report side effects to FDA at1-800-FDA-1088. Where should I keep my medication? This medicine is only given in a clinic, doctor's office, or other health caresetting and will not be stored at home. NOTE: This sheet is a summary. It may not cover all possible information. If you have questions about this medicine, talk to your doctor, pharmacist, orhealth care provider.  2022 Elsevier/Gold Standard (2018-03-25 16:10:44)

## 2021-06-13 LAB — MULTIPLE MYELOMA PANEL, SERUM
Albumin SerPl Elph-Mcnc: 3.5 g/dL (ref 2.9–4.4)
Albumin/Glob SerPl: 1.3 (ref 0.7–1.7)
Alpha 1: 0.2 g/dL (ref 0.0–0.4)
Alpha2 Glob SerPl Elph-Mcnc: 0.7 g/dL (ref 0.4–1.0)
B-Globulin SerPl Elph-Mcnc: 0.8 g/dL (ref 0.7–1.3)
Gamma Glob SerPl Elph-Mcnc: 1 g/dL (ref 0.4–1.8)
Globulin, Total: 2.7 g/dL (ref 2.2–3.9)
IgA: 111 mg/dL (ref 61–437)
IgG (Immunoglobin G), Serum: 1075 mg/dL (ref 603–1613)
IgM (Immunoglobulin M), Srm: 19 mg/dL (ref 15–143)
Total Protein ELP: 6.2 g/dL (ref 6.0–8.5)

## 2021-06-16 ENCOUNTER — Other Ambulatory Visit: Payer: Self-pay | Admitting: *Deleted

## 2021-06-16 ENCOUNTER — Other Ambulatory Visit: Payer: Self-pay | Admitting: Oncology

## 2021-06-16 DIAGNOSIS — C9000 Multiple myeloma not having achieved remission: Secondary | ICD-10-CM

## 2021-06-16 MED ORDER — LENALIDOMIDE 10 MG PO CAPS: ORAL_CAPSULE | ORAL | 6 refills | Status: DC

## 2021-06-26 ENCOUNTER — Encounter: Payer: Self-pay | Admitting: Oncology

## 2021-07-07 DIAGNOSIS — G609 Hereditary and idiopathic neuropathy, unspecified: Secondary | ICD-10-CM | POA: Diagnosis not present

## 2021-07-09 ENCOUNTER — Other Ambulatory Visit: Payer: Self-pay | Admitting: Adult Health

## 2021-07-09 DIAGNOSIS — D472 Monoclonal gammopathy: Secondary | ICD-10-CM

## 2021-07-16 ENCOUNTER — Other Ambulatory Visit: Payer: Self-pay | Admitting: Oncology

## 2021-07-16 DIAGNOSIS — C9000 Multiple myeloma not having achieved remission: Secondary | ICD-10-CM

## 2021-07-17 DIAGNOSIS — C439 Malignant melanoma of skin, unspecified: Secondary | ICD-10-CM | POA: Diagnosis not present

## 2021-07-17 DIAGNOSIS — E785 Hyperlipidemia, unspecified: Secondary | ICD-10-CM | POA: Diagnosis not present

## 2021-07-17 DIAGNOSIS — M1A09X0 Idiopathic chronic gout, multiple sites, without tophus (tophi): Secondary | ICD-10-CM | POA: Diagnosis not present

## 2021-07-17 DIAGNOSIS — I10 Essential (primary) hypertension: Secondary | ICD-10-CM | POA: Diagnosis not present

## 2021-07-22 ENCOUNTER — Other Ambulatory Visit: Payer: Self-pay | Admitting: *Deleted

## 2021-07-22 DIAGNOSIS — C9000 Multiple myeloma not having achieved remission: Secondary | ICD-10-CM

## 2021-07-22 MED ORDER — LENALIDOMIDE 10 MG PO CAPS: ORAL_CAPSULE | ORAL | 6 refills | Status: DC

## 2021-07-24 DIAGNOSIS — M1A09X0 Idiopathic chronic gout, multiple sites, without tophus (tophi): Secondary | ICD-10-CM | POA: Diagnosis not present

## 2021-07-24 DIAGNOSIS — D472 Monoclonal gammopathy: Secondary | ICD-10-CM | POA: Diagnosis not present

## 2021-07-24 DIAGNOSIS — I10 Essential (primary) hypertension: Secondary | ICD-10-CM | POA: Diagnosis not present

## 2021-07-24 DIAGNOSIS — G609 Hereditary and idiopathic neuropathy, unspecified: Secondary | ICD-10-CM | POA: Diagnosis not present

## 2021-07-24 DIAGNOSIS — E785 Hyperlipidemia, unspecified: Secondary | ICD-10-CM | POA: Diagnosis not present

## 2021-08-12 ENCOUNTER — Other Ambulatory Visit: Payer: Self-pay | Admitting: Adult Health

## 2021-08-12 DIAGNOSIS — D472 Monoclonal gammopathy: Secondary | ICD-10-CM

## 2021-08-19 ENCOUNTER — Other Ambulatory Visit: Payer: Self-pay | Admitting: *Deleted

## 2021-08-19 DIAGNOSIS — C9000 Multiple myeloma not having achieved remission: Secondary | ICD-10-CM

## 2021-08-19 MED ORDER — LENALIDOMIDE 10 MG PO CAPS: ORAL_CAPSULE | ORAL | 0 refills | Status: DC

## 2021-08-25 ENCOUNTER — Other Ambulatory Visit: Payer: Self-pay | Admitting: Oncology

## 2021-08-25 DIAGNOSIS — C9000 Multiple myeloma not having achieved remission: Secondary | ICD-10-CM

## 2021-09-05 ENCOUNTER — Other Ambulatory Visit: Payer: Self-pay | Admitting: Adult Health

## 2021-09-05 DIAGNOSIS — C9000 Multiple myeloma not having achieved remission: Secondary | ICD-10-CM

## 2021-09-08 ENCOUNTER — Inpatient Hospital Stay: Payer: Medicare Other | Attending: Oncology

## 2021-09-08 ENCOUNTER — Other Ambulatory Visit: Payer: Self-pay

## 2021-09-08 DIAGNOSIS — C9000 Multiple myeloma not having achieved remission: Secondary | ICD-10-CM | POA: Diagnosis not present

## 2021-09-08 DIAGNOSIS — D649 Anemia, unspecified: Secondary | ICD-10-CM | POA: Insufficient documentation

## 2021-09-08 DIAGNOSIS — N179 Acute kidney failure, unspecified: Secondary | ICD-10-CM

## 2021-09-08 DIAGNOSIS — M109 Gout, unspecified: Secondary | ICD-10-CM | POA: Diagnosis not present

## 2021-09-08 DIAGNOSIS — Z7961 Long term (current) use of immunomodulator: Secondary | ICD-10-CM | POA: Insufficient documentation

## 2021-09-08 DIAGNOSIS — Z79899 Other long term (current) drug therapy: Secondary | ICD-10-CM | POA: Insufficient documentation

## 2021-09-08 DIAGNOSIS — Z7189 Other specified counseling: Secondary | ICD-10-CM

## 2021-09-08 DIAGNOSIS — Z298 Encounter for other specified prophylactic measures: Secondary | ICD-10-CM | POA: Diagnosis not present

## 2021-09-09 LAB — KAPPA/LAMBDA LIGHT CHAINS
Kappa free light chain: 58.4 mg/L — ABNORMAL HIGH (ref 3.3–19.4)
Kappa, lambda light chain ratio: 2.16 — ABNORMAL HIGH (ref 0.26–1.65)
Lambda free light chains: 27 mg/L — ABNORMAL HIGH (ref 5.7–26.3)

## 2021-09-10 NOTE — Progress Notes (Signed)
Joe [name]  [phone] [phone]     ID: Joe [name] DOB: 1929-10-21  MR#: [id number]  DPO#:[id number]  Patient Care Team: Joe Seashore, MD as PCP - General (Internal Medicine) Joe [name], Joe Dad, MD as Consulting Physician (Oncology) Joe [name], Joe Setting, MD OTHER MD:  CHIEF COMPLAINT: multiple myeloma  CURRENT TREATMENT: denosumab Xgeva every 12 weeks; lenalidomide 10 mg 21/7   INTERVAL HISTORY: 87 returns today for follow up and treatment of his multiple myeloma.   He continues on lenalidomide, 85m daily 21 days on and 7 day off.  He tolerates this well, with minimal constipation, no fatigue or nausea, no neuropathy and no sleepiness.  He does say however he feels a little bit better on the off week.  We are following chiefly his M spike; however the most recent reading is pending  The kappa lambda ratio shows good control overall Lab Results  Component Value Date   KAPLAMBRATIO 2.16 (H) 09/08/2021   KAPLAMBRATIO 1.53 06/09/2021   KAPLAMBRATIO 2.57 (H) 04/17/2021   KAPLAMBRATIO 2.07 (H) 03/20/2021   KAPLAMBRATIO 1.79 (H) 02/06/2021   Also he receives denosumab/Xgeva every 12 weeks, most recently given on 06/12/2021.  He has a dose due today   REVIEW OF SYSTEMS: [name] very stressed because his wife continues to decline.  She has significant dementia and is now "hallucinating" he says.  He has Belson the door because she tends to wander.  When he is not with her the family has to be there and of course she also wakes up in the middle of the night and that means he has to get up 2 to make sure she does not get into any trouble.  Aside from that a detailed review of systems today was stable   COVID 19 VACCINATION STATUS: He is received Pfizer x2 with booster October 2021.  He received evusheld on 03/20/2021 and is due a second dose this month.  HISTORY OF CURRENT ILLNESS: From the original intake  note:  Mr. Joe [name] an 87y/o GGuyanaman formerly followed by my partner Dr Joe [name] [name]. We have an M-Spike of 0.87 documented 07/12/2012. [name] kappa/lambda light chains and total IgG were follower yearly until 01/2016, when they were 2.36 and 1517 respectively (unremarkable). He was lost to follow-up after that point.  Yesterday [02/29/2020] the patient presented to Urgent Care with poorly controlled pain. This was felt to be mussculoskeletal and he was started on a medrol dose-pak and robaxin. However his pain worsened and [facility] but labs showed a creatinine of 1.84 (baseline 1.0), calcium 12.7 with albumin 2.8 and total protein 10.4 (globulin fraction 7.8). CT of the abdomen obtained for evaluation of his abdominal and back pain showed no hydronephrosis but multiple lytic lesons. We were consulted for further evaluation and treatment of likely multiple myeloma and he was admitted to the [name].    The patient's subsequent history is as detailed below.   PAST MEDICAL HISTORY: Past Medical History:  Diagnosis Date   Enlarged prostate    Hypertension    Melanoma ([name] 2021    PAST SURGICAL HISTORY: Past Surgical History:  Procedure Laterality Date   EYE SURGERY     PROSTATE SURGERY      FAMILY HISTORY Family History  Problem Relation Age of Onset   Hypertension Mother     SOCIAL HISTORY:  Retired, used to work for  [employer]. Lives with wife Joe [name]. Has 4 children, 2 in [place], one in [place], one in [place]; 5 grandchildren and 5 great-grandchildren. Attends a [name].    ADVANCED DIRECTIVES: at the initial visit the patient confirmed his wife is his HCPOA (despite her memory issues).   HEALTH MAINTENANCE: Social History   Tobacco Use   Smoking status: Never   Smokeless tobacco: Never  Vaping Use   Vaping Use: Never used  Substance Use Topics    Alcohol use: Never   Drug use: Never     Colonoscopy: 6-8 years ago     No Known Allergies  Current Outpatient Medications  Medication Sig Dispense Refill   acyclovir (ZOVIRAX) 400 MG tablet Take 1 tablet (400 mg total) by mouth 2 (two) times daily. 180 tablet 3   amLODipine (NORVASC) 5 MG tablet Take 5 mg by mouth daily.     aspirin EC 81 MG tablet Take 81 mg by mouth daily. Swallow whole.      calcium carbonate (TUMS - DOSED IN MG ELEMENTAL CALCIUM) 500 MG chewable tablet Chew 3 tablets by mouth daily.     doxazosin (CARDURA) 4 MG tablet Take 4 mg by mouth at bedtime.     famotidine (PEPCID) 20 MG tablet Take 1 tablet by mouth once daily 90 tablet 0   finasteride (PROSCAR) 5 MG tablet Take 1 tablet by mouth once daily 30 tablet 0   gabapentin (NEURONTIN) 100 MG capsule Take 2 capsules (200 mg total) by mouth at bedtime. 90 capsule 4   indapamide (LOZOL) 2.5 MG tablet Take 2.5 mg by mouth daily.      lenalidomide (REVLIMID) 10 MG capsule Take 1 tablet daily for 21 days then 7 days off every 28 days Adult male [employer] auth # [id number] obtained09/20/2022 21 capsule 6   ondansetron (ZOFRAN) 8 MG tablet TAKE 1 TABLET BY MOUTH THREE TIMES DAILY AS NEEDED FOR NAUSEA OR VOMITING 20 tablet 0   polyethylene glycol powder (MIRALAX) 17 GM/SCOOP powder Take 255 g by mouth daily. 255 g 0   potassium chloride (KLOR-CON) 10 MEQ tablet Take 2 tablets by mouth once daily 180 tablet 0   pravastatin (PRAVACHOL) 40 MG tablet Take 40 mg by mouth daily.     prochlorperazine (COMPAZINE) 10 MG tablet TAKE 1 TABLET BY MOUTH EVERY 6 HOURS AS NEEDED FOR NAUSEA AND VOMITING 30 tablet 0   tamsulosin (FLOMAX) 0.4 MG CAPS capsule TAKE 1 CAPSULE BY MOUTH ONCE DAILY AFTER SUPPER 30 capsule 3   traMADol (ULTRAM) 50 MG tablet TAKE 1 TABLET BY MOUTH EVERY 8 HOURS AS NEEDED. 30 tablet 0   vitamin B-12 (CYANOCOBALAMIN) 1000 MCG tablet Take 1,000 mcg by mouth daily.     No current facility-administered medications for this  visit.   Facility-Administered Medications Ordered in Other Visits  Medication Dose Route Frequency Provider Last Rate Last Admin   denosumab (XGEVA) injection 120 mg  120 mg Subcutaneous Once Joe [name], Joe Dad, MD        OBJECTIVE: African-American man in no acute distress Vitals:   09/11/21 1033  BP: (!) 137/44  Pulse: 90  Resp: 16  Temp: 97.9 F (36.6 C)  SpO2: 100%      Body mass index is 24.62 kg/m.   Wt Readings from Last 3 Encounters:  09/11/21 176 lb 8 oz (80.1 kg)  06/12/21 180 lb 4.8 oz (81.8 kg)  03/20/21 181 lb 3.2 oz (82.2 kg)  ECOG FS:1 - Symptomatic but  completely ambulatory  Sclerae unicteric, EOMs intact Wearing a mask No cervical or supraclavicular adenopathy Lungs no rales or rhonchi Heart regular rate and rhythm Abd soft, nontender, positive bowel sounds MSK no focal spinal tenderness Neuro: nonfocal, well oriented, appropriate affect   LAB RESULTS:  CMP     Component Value Date/Time   NA 138 06/09/2021 1021   NA 140 01/09/2016 1045   K 3.8 06/09/2021 1021   K 3.8 01/09/2016 1045   CL 103 06/09/2021 1021   CO2 28 06/09/2021 1021   CO2 27 01/09/2016 1045   GLUCOSE 105 (H) 06/09/2021 1021   GLUCOSE 89 01/09/2016 1045   BUN 16 06/09/2021 1021   BUN 14.7 01/09/2016 1045   CREATININE 1.02 06/09/2021 1021   CREATININE 1.0 01/09/2016 1045   CALCIUM 9.3 06/09/2021 1021   CALCIUM 9.2 01/09/2016 1045   PROT 6.6 06/09/2021 1021   PROT 7.3 01/09/2016 1045   ALBUMIN 3.4 (L) 06/09/2021 1021   ALBUMIN 3.7 01/09/2016 1045   AST 16 06/09/2021 1021   AST 18 01/09/2016 1045   ALT 10 06/09/2021 1021   ALT 15 01/09/2016 1045   ALKPHOS 57 06/09/2021 1021   ALKPHOS 60 01/09/2016 1045   BILITOT 0.6 06/09/2021 1021   BILITOT 0.51 01/09/2016 1045   GFRNONAA >60 06/09/2021 1021   GFRAA >60 08/30/2020 1402    Lab Results  Component Value Date   TOTALPROTELP 6.2 06/09/2021   ALBUMINELP 56.1 07/12/2012   A1GS 3.5 07/12/2012   A2GS 9.8 07/12/2012    BETS 5.1 07/12/2012   BETA2SER 3.6 07/12/2012   GAMS 21.9 (H) 07/12/2012   MSPIKE 0.87 07/12/2012   SPEI * 07/12/2012     Lab Results  Component Value Date   KPAFRELGTCHN 58.4 (H) 09/08/2021   LAMBDASER 27.0 (H) 09/08/2021   KAPLAMBRATIO 2.16 (H) 09/08/2021    Lab Results  Component Value Date   WBC 4.0 09/11/2021   NEUTROABS 1.9 09/11/2021   HGB 9.3 (L) 09/11/2021   HCT 29.9 (L) 09/11/2021   MCV 80.4 09/11/2021   PLT 115 (L) 09/11/2021      Chemistry      Component Value Date/Time   NA 138 06/09/2021 1021   NA 140 01/09/2016 1045   K 3.8 06/09/2021 1021   K 3.8 01/09/2016 1045   CL 103 06/09/2021 1021   CO2 28 06/09/2021 1021   CO2 27 01/09/2016 1045   BUN 16 06/09/2021 1021   BUN 14.7 01/09/2016 1045   CREATININE 1.02 06/09/2021 1021   CREATININE 1.0 01/09/2016 1045      Component Value Date/Time   CALCIUM 9.3 06/09/2021 1021   CALCIUM 9.2 01/09/2016 1045   ALKPHOS 57 06/09/2021 1021   ALKPHOS 60 01/09/2016 1045   AST 16 06/09/2021 1021   AST 18 01/09/2016 1045   ALT 10 06/09/2021 1021   ALT 15 01/09/2016 1045   BILITOT 0.6 06/09/2021 1021   BILITOT 0.51 01/09/2016 1045       No results found for: LABCA2  No components found for: QPRFFM384  No results for input(s): INR in the last 168 hours.  No results found for: LABCA2  No results found for: YKZ993  No results found for: TTS177  No results found for: LTJ030  No results found for: CA2729  No components found for: HGQUANT  No results found for: CEA1 / No results found for: CEA1   No results found for: AFPTUMOR  No results found for: [place]  No results found for: HGBA,  HGBA2QUANT, HGBFQUANT, HGBSQUAN (Hemoglobinopathy evaluation)   Lab Results  Component Value Date   LDH 136 01/09/2016    Lab Results  Component Value Date   IRON 89 04/17/2021   TIBC 245 04/17/2021   IRONPCTSAT 36 04/17/2021   (Iron and TIBC)  Lab Results  Component Value Date   FERRITIN 340 (H)  04/17/2021    Urinalysis    Component Value Date/Time   COLORURINE AMBER (A) 03/01/2020 1052   APPEARANCEUR CLOUDY (A) 03/01/2020 1052   LABSPEC 1.021 03/01/2020 1052   PHURINE 5.0 03/01/2020 1052   GLUCOSEU NEGATIVE 03/01/2020 1052   HGBUR NEGATIVE 03/01/2020 1052   BILIRUBINUR NEGATIVE 03/01/2020 1052   KETONESUR NEGATIVE 03/01/2020 1052   PROTEINUR 100 (A) 03/01/2020 1052   UROBILINOGEN 0.2 02/29/2020 1537   NITRITE NEGATIVE 03/01/2020 1052   LEUKOCYTESUR NEGATIVE 03/01/2020 1052    STUDIES: No results found.   ELIGIBLE FOR AVAILABLE RESEARCH PROTOCOL:   ASSESSMENT: 87 y.o.  11  man with a history of [name], presenting 02/29/2020 with a high globulin fraction, worsening renal function, hypercalcemia, anemia and multiple lytic bone lesions.    (1) IgG Kappa Multiple Myeloma: labs diagnostic on 03/01/2020 with Mspike of 3.8, Kappa free light chain 690.6, and ratio 117.05.  (a) CT A/P on 03/01/2020 shows "innumerable" lytic lesions involving all visualized bones  (b) bone marrow biopsy 03/11/2020 shows 20% plasmacytosis by CD138, 12% by manual differential; molecular studies not requested  (c) Bortezomib and Dexamethasone given weekly 3 weeks on and 1 week off beginning 03/11/2020, with good response, last dose 07/24/2020  (d) transitioned to lenalidomide 10 mg daily 21/7 starting 07/31/2020    (2) lytic bone lesions/ hypercalcemia:  (a) Pamidronate administered on 03/01/2020  (b) denosumab/Xgeva started 04/17/2020-- repeat every 12 weeks   (a) to take TUMS 3 tabs on day of Xgeva dose   PLAN:  Mr. Bonsall is now a year and a half out from definitive diagnosis of multiple myeloma.  Clinically he is very stable on lenalidomide 10 mg daily 3 weeks on 1 week off.  He tolerates the treatment well.  We are awaiting on his more recent M spike results..  We were supposed to give him Xgeva and evusheld today but his labs have not been drawn by the time he got back and the   evusheld could not be added for today.  Accordingly he will be coming back within the next few days for lab work and to receive those treatments  We discussed the very difficult situation with his wife.  At this point the family is not ready for her to be institutionalized.   We are going to recheck his myeloma labs in 6 weeks and he will return in 12 weeks for his next visit and next Xgeva dose.  Total encounter time 25 minutes.Sarajane Jews C. Norman Piacentini, MD 09/11/21 1:28 PM Medical Oncology and Hematology [facility] [place], [place] [address] Tel. [phone]    Fax. [phone]   I, Wilburn Mylar, am acting as scribe for Dr. Virgie [name]. Renda Pohlman.  I, Lurline Del MD, have reviewed the above documentation for accuracy and completeness, and I agree with the above.   *Total Encounter Time as defined by the Centers for Medicare and Medicaid Services includes, in addition to the face-to-face time of a patient visit (documented in the note above) non-face-to-face time: obtaining and reviewing outside history, ordering and reviewing medications, tests or procedures, care coordination (communications with other health  care professionals or caregivers) and documentation in the medical record.

## 2021-09-11 ENCOUNTER — Inpatient Hospital Stay (HOSPITAL_BASED_OUTPATIENT_CLINIC_OR_DEPARTMENT_OTHER): Payer: Medicare Other | Admitting: Oncology

## 2021-09-11 ENCOUNTER — Other Ambulatory Visit: Payer: Self-pay

## 2021-09-11 ENCOUNTER — Inpatient Hospital Stay: Payer: Medicare Other

## 2021-09-11 VITALS — BP 137/44 | HR 90 | Temp 97.9°F | Resp 16 | Ht 71.0 in | Wt 176.5 lb

## 2021-09-11 DIAGNOSIS — D649 Anemia, unspecified: Secondary | ICD-10-CM

## 2021-09-11 DIAGNOSIS — C9000 Multiple myeloma not having achieved remission: Secondary | ICD-10-CM

## 2021-09-11 DIAGNOSIS — Z79899 Other long term (current) drug therapy: Secondary | ICD-10-CM | POA: Diagnosis not present

## 2021-09-11 DIAGNOSIS — Z7189 Other specified counseling: Secondary | ICD-10-CM

## 2021-09-11 DIAGNOSIS — N179 Acute kidney failure, unspecified: Secondary | ICD-10-CM

## 2021-09-11 DIAGNOSIS — Z298 Encounter for other specified prophylactic measures: Secondary | ICD-10-CM | POA: Diagnosis not present

## 2021-09-11 DIAGNOSIS — M109 Gout, unspecified: Secondary | ICD-10-CM | POA: Diagnosis not present

## 2021-09-11 LAB — CMP (CANCER CENTER ONLY)
ALT: 14 U/L (ref 0–44)
AST: 16 U/L (ref 15–41)
Albumin: 3.4 g/dL — ABNORMAL LOW (ref 3.5–5.0)
Alkaline Phosphatase: 46 U/L (ref 38–126)
Anion gap: 6 (ref 5–15)
BUN: 25 mg/dL — ABNORMAL HIGH (ref 8–23)
CO2: 27 mmol/L (ref 22–32)
Calcium: 8.5 mg/dL — ABNORMAL LOW (ref 8.9–10.3)
Chloride: 103 mmol/L (ref 98–111)
Creatinine: 0.97 mg/dL (ref 0.61–1.24)
GFR, Estimated: >60 mL/min (ref >60)
Glucose, Bld: 82 mg/dL (ref 70–99)
Potassium: 3.5 mmol/L (ref 3.5–5.1)
Sodium: 136 mmol/L (ref 135–145)
Total Bilirubin: 0.7 mg/dL (ref 0.3–1.2)
Total Protein: 6.9 g/dL (ref 6.5–8.1)

## 2021-09-11 LAB — MULTIPLE MYELOMA PANEL, SERUM
Albumin SerPl Elph-Mcnc: 3.3 g/dL (ref 2.9–4.4)
Albumin/Glob SerPl: 1 (ref 0.7–1.7)
Alpha 1: 0.3 g/dL (ref 0.0–0.4)
Alpha2 Glob SerPl Elph-Mcnc: 0.8 g/dL (ref 0.4–1.0)
B-Globulin SerPl Elph-Mcnc: 1 g/dL (ref 0.7–1.3)
Gamma Glob SerPl Elph-Mcnc: 1.3 g/dL (ref 0.4–1.8)
Globulin, Total: 3.4 g/dL (ref 2.2–3.9)
IgA: 241 mg/dL (ref 61–437)
IgG (Immunoglobin G), Serum: 1425 mg/dL (ref 603–1613)
IgM (Immunoglobulin M), Srm: 41 mg/dL (ref 15–143)
Total Protein ELP: 6.7 g/dL (ref 6.0–8.5)

## 2021-09-11 LAB — CBC WITH DIFFERENTIAL/PLATELET
Abs Immature Granulocytes: 0.01 10*3/uL (ref 0.00–0.07)
Basophils Absolute: 0 10*3/uL (ref 0.0–0.1)
Basophils Relative: 1 %
Eosinophils Absolute: 0.3 10*3/uL (ref 0.0–0.5)
Eosinophils Relative: 8 %
HCT: 29.9 % — ABNORMAL LOW (ref 39.0–52.0)
Hemoglobin: 9.3 g/dL — ABNORMAL LOW (ref 13.0–17.0)
Immature Granulocytes: 0 %
Lymphocytes Relative: 34 %
Lymphs Abs: 1.4 10*3/uL (ref 0.7–4.0)
MCH: 25 pg — ABNORMAL LOW (ref 26.0–34.0)
MCHC: 31.1 g/dL (ref 30.0–36.0)
MCV: 80.4 fL (ref 80.0–100.0)
Monocytes Absolute: 0.4 10*3/uL (ref 0.1–1.0)
Monocytes Relative: 11 %
Neutro Abs: 1.9 10*3/uL (ref 1.7–7.7)
Neutrophils Relative %: 46 %
Platelets: 115 10*3/uL — ABNORMAL LOW (ref 150–400)
RBC: 3.72 MIL/uL — ABNORMAL LOW (ref 4.22–5.81)
RDW: 14.6 % (ref 11.5–15.5)
WBC: 4 10*3/uL (ref 4.0–10.5)
nRBC: 0 % (ref 0.0–0.2)

## 2021-09-11 MED ORDER — ACYCLOVIR 400 MG PO TABS: 400.0000 mg | ORAL_TABLET | Freq: Two times a day (BID) | ORAL | 3 refills | Status: DC

## 2021-09-11 MED ORDER — DENOSUMAB 120 MG/1.7ML ~~LOC~~ SOLN
120.0000 mg | Freq: Once | SUBCUTANEOUS | Status: AC
Start: 2021-09-11 — End: 2021-09-11
  Administered 2021-09-11: 120 mg via SUBCUTANEOUS
  Filled 2021-09-11: qty 1.7

## 2021-09-11 MED ORDER — LENALIDOMIDE 10 MG PO CAPS
ORAL_CAPSULE | ORAL | 6 refills | Status: DC
Start: 2021-09-11 — End: 2021-09-17

## 2021-09-11 NOTE — Patient Instructions (Signed)
Denosumab injection What is this medication? DENOSUMAB (den oh sue mab) slows bone breakdown. Prolia is used to treat osteoporosis in women after menopause and in men, and in people who are taking corticosteroids for 6 months or more. Xgeva is used to treat a high calcium level due to cancer and to prevent bone fractures and other bone problems caused by multiple myeloma or cancer bone metastases. Xgeva is also used to treat giant cell tumor of the bone. This medicine may be used for other purposes; ask your health care provider or pharmacist if you have questions. COMMON BRAND NAME(S): Prolia, XGEVA What should I tell my care team before I take this medication? They need to know if you have any of these conditions: dental disease having surgery or tooth extraction infection kidney disease low levels of calcium or Vitamin D in the blood malnutrition on hemodialysis skin conditions or sensitivity thyroid or parathyroid disease an unusual reaction to denosumab, other medicines, foods, dyes, or preservatives pregnant or trying to get pregnant breast-feeding How should I use this medication? This medicine is for injection under the skin. It is given by a health care professional in a hospital or clinic setting. A special MedGuide will be given to you before each treatment. Be sure to read this information carefully each time. For Prolia, talk to your pediatrician regarding the use of this medicine in children. Special care may be needed. For Xgeva, talk to your pediatrician regarding the use of this medicine in children. While this drug may be prescribed for children as young as 13 years for selected conditions, precautions do apply. Overdosage: If you think you have taken too much of this medicine contact a poison control center or emergency room at once. NOTE: This medicine is only for you. Do not share this medicine with others. What if I miss a dose? It is important not to miss your dose.  Call your doctor or health care professional if you are unable to keep an appointment. What may interact with this medication? Do not take this medicine with any of the following medications: other medicines containing denosumab This medicine may also interact with the following medications: medicines that lower your chance of fighting infection steroid medicines like prednisone or cortisone This list may not describe all possible interactions. Give your health care provider a list of all the medicines, herbs, non-prescription drugs, or dietary supplements you use. Also tell them if you smoke, drink alcohol, or use illegal drugs. Some items may interact with your medicine. What should I watch for while using this medication? Visit your doctor or health care professional for regular checks on your progress. Your doctor or health care professional may order blood tests and other tests to see how you are doing. Call your doctor or health care professional for advice if you get a fever, chills or sore throat, or other symptoms of a cold or flu. Do not treat yourself. This drug may decrease your body's ability to fight infection. Try to avoid being around people who are sick. You should make sure you get enough calcium and vitamin D while you are taking this medicine, unless your doctor tells you not to. Discuss the foods you eat and the vitamins you take with your health care professional. See your dentist regularly. Brush and floss your teeth as directed. Before you have any dental work done, tell your dentist you are receiving this medicine. Do not become pregnant while taking this medicine or for 5 months after   stopping it. Talk with your doctor or health care professional about your birth control options while taking this medicine. Women should inform their doctor if they wish to become pregnant or think they might be pregnant. There is a potential for serious side effects to an unborn child. Talk to  your health care professional or pharmacist for more information. What side effects may I notice from receiving this medication? Side effects that you should report to your doctor or health care professional as soon as possible: allergic reactions like skin rash, itching or hives, swelling of the face, lips, or tongue bone pain breathing problems dizziness jaw pain, especially after dental work redness, blistering, peeling of the skin signs and symptoms of infection like fever or chills; cough; sore throat; pain or trouble passing urine signs of low calcium like fast heartbeat, muscle cramps or muscle pain; pain, tingling, numbness in the hands or feet; seizures unusual bleeding or bruising unusually weak or tired Side effects that usually do not require medical attention (report to your doctor or health care professional if they continue or are bothersome): constipation diarrhea headache joint pain loss of appetite muscle pain runny nose tiredness upset stomach This list may not describe all possible side effects. Call your doctor for medical advice about side effects. You may report side effects to FDA at 1-800-FDA-1088. Where should I keep my medication? This medicine is only given in a clinic, doctor's office, or other health care setting and will not be stored at home. NOTE: This sheet is a summary. It may not cover all possible information. If you have questions about this medicine, talk to your doctor, pharmacist, or health care provider.  2022 Elsevier/Gold Standard (2018-03-25 16:10:44)

## 2021-09-11 NOTE — Progress Notes (Signed)
Patient came in for Xgeva injection and Calcium level is 8.5.  Okay per Dr. Jana Hakim to proceed.  Instructed patient to double up on Calcium at home for the next 3 days.  Patient verbalized understanding.

## 2021-09-12 ENCOUNTER — Other Ambulatory Visit: Payer: Self-pay | Admitting: Adult Health

## 2021-09-12 DIAGNOSIS — D472 Monoclonal gammopathy: Secondary | ICD-10-CM

## 2021-09-16 DIAGNOSIS — H5212 Myopia, left eye: Secondary | ICD-10-CM | POA: Diagnosis not present

## 2021-09-16 DIAGNOSIS — H2512 Age-related nuclear cataract, left eye: Secondary | ICD-10-CM | POA: Diagnosis not present

## 2021-09-17 ENCOUNTER — Other Ambulatory Visit: Payer: Self-pay | Admitting: *Deleted

## 2021-09-17 DIAGNOSIS — C9000 Multiple myeloma not having achieved remission: Secondary | ICD-10-CM

## 2021-09-17 MED ORDER — LENALIDOMIDE 10 MG PO CAPS: ORAL_CAPSULE | ORAL | 6 refills | Status: DC

## 2021-09-22 ENCOUNTER — Inpatient Hospital Stay: Payer: Medicare Other

## 2021-09-22 ENCOUNTER — Other Ambulatory Visit: Payer: Self-pay

## 2021-09-22 DIAGNOSIS — D649 Anemia, unspecified: Secondary | ICD-10-CM | POA: Diagnosis not present

## 2021-09-22 DIAGNOSIS — M109 Gout, unspecified: Secondary | ICD-10-CM | POA: Diagnosis not present

## 2021-09-22 DIAGNOSIS — C9000 Multiple myeloma not having achieved remission: Secondary | ICD-10-CM | POA: Diagnosis not present

## 2021-09-22 DIAGNOSIS — Z79899 Other long term (current) drug therapy: Secondary | ICD-10-CM | POA: Diagnosis not present

## 2021-09-22 DIAGNOSIS — Z298 Encounter for other specified prophylactic measures: Secondary | ICD-10-CM | POA: Diagnosis not present

## 2021-09-22 DIAGNOSIS — Z7961 Long term (current) use of immunomodulator: Secondary | ICD-10-CM | POA: Diagnosis not present

## 2021-09-22 MED ORDER — CILGAVIMAB (PART OF EVUSHELD) INJECTION
300.0000 mg | Freq: Once | INTRAMUSCULAR | Status: AC
  Administered 2021-09-22: 300 mg via INTRAMUSCULAR
  Filled 2021-09-22: qty 3

## 2021-09-22 MED ORDER — TIXAGEVIMAB (PART OF EVUSHELD) INJECTION
300.0000 mg | Freq: Once | INTRAMUSCULAR | Status: AC
  Administered 2021-09-22: 300 mg via INTRAMUSCULAR
  Filled 2021-09-22: qty 3

## 2021-09-22 NOTE — Patient Instructions (Signed)
Tixagevimab; Cilg[employer]imab Solutions for Injection What is this medication? TIXAGEVIMAB; CILGAVIMAB (tix a jev i mab; sil gav i mab) reduces the risk of getting COVID-19 in persons with immune system problems who may not respond properly to the COVID-19 vaccine or persons who can't receive the COVID-19 vaccine. It belongs to a group of medications called monoclonal antibodies. It may decrease the risk of developing severe symptoms of COVID-19. It may also decrease the chance of going to the hospital. This medication is not approved by the FDA. The FDA has authorized emergency use of this medication during the COVID-19 pandemic. This medicine may be used for other purposes; ask your health care provider or pharmacist if you have questions. COMMON BRAND NAME(S): EVUSHELD What should I tell my care team before I take this medication? They need to know if you have any of these conditions: any allergies any serious illness bleeding disorder have received a COVID-19 vaccine heart disease an unusual or allergic reaction to tixagevimab, cilgavimab, other medications, foods, dyes, or preservatives pregnant or trying to get pregnant breast-feeding How should I use this medication? This medication is injected into a muscle. It is given by your care team in a hospital or clinic setting. Talk to your care team about the use of this medication in children. While it may be given to children as young as 12 years, precautions do apply. Overdosage: If you think you have taken too much of this medicine contact a poison control center or emergency room at once. NOTE: This medicine is only for you. Do not share this medicine with others. What if I miss a dose? Keep appointments for follow-up doses. It is important not to miss your dose. Call your care team if you are unable to keep an appointment. What may interact with this medication? COVID-19 vaccines This list may not describe all possible interactions. Give  your health care provider a list of all the medicines, herbs, non-prescription drugs, or dietary supplements you use. Also tell them if you smoke, drink alcohol, or use illegal drugs. Some items may interact with your medicine. What should I watch for while using this medication? Your condition will be monitored carefully while you are receiving this medication. Visit your care team for regular checks on your progress. Tell your care team if your symptoms do not start to get better or if they get worse. After receiving this medication, wait at least 14 days before getting the COVID-19 vaccine. What side effects may I notice from receiving this medication? Side effects that you should report to your care team as soon as possible: Allergic reactions-skin rash, itching, hives, swelling of the face, lips, tongue, or throat Heart attack-pain or tightness in the chest, shoulders, arms, or jaw, nausea, shortness of breath, cold or clammy skin, feeling faint or lightheaded Heart failure-shortness of breath, swelling of the ankles, feet, or hands, sudden weight gain, unusual weakness or fatigue Heart rhythm changes-fast or irregular heartbeat, dizziness, feeling faint or lightheaded, chest pain, trouble breathing Side effects that usually do not require medical attention (report these to your care team if they continue or are bothersome): Cough Fatigue Headache Pain, redness, or irritation at site where injected This list may not describe all possible side effects. Call your doctor for medical advice about side effects. You may report side effects to FDA at 1-800-FDA-1088. Where should I keep my medication? This medication is given in a hospital or clinic. It will not be stored at home. NOTE: This sheet is  a summary. It may not cover all possible information. If you have questions about this medicine, talk to your doctor, pharmacist, or health care provider.  2022 Elsevier/Gold Standard (2020-11-07  16:22:16)

## 2021-09-23 DIAGNOSIS — Z23 Encounter for immunization: Secondary | ICD-10-CM | POA: Diagnosis not present

## 2021-09-25 ENCOUNTER — Other Ambulatory Visit: Payer: Self-pay | Admitting: Oncology

## 2021-09-26 ENCOUNTER — Telehealth: Payer: Self-pay

## 2021-09-26 NOTE — Telephone Encounter (Signed)
Pt called and LVM requesting refill on gabapentin. Attempted to return pt's call to inform him it is too soon to fill and he has 4 refills when needed. LVM for pt to CB.

## 2021-10-01 ENCOUNTER — Other Ambulatory Visit: Payer: Self-pay | Admitting: Oncology

## 2021-10-05 ENCOUNTER — Other Ambulatory Visit: Payer: Self-pay | Admitting: Oncology

## 2021-10-06 ENCOUNTER — Encounter: Payer: Self-pay | Admitting: Oncology

## 2021-10-10 ENCOUNTER — Other Ambulatory Visit: Payer: Self-pay | Admitting: Oncology

## 2021-10-10 DIAGNOSIS — D472 Monoclonal gammopathy: Secondary | ICD-10-CM

## 2021-10-15 ENCOUNTER — Inpatient Hospital Stay: Payer: Medicare Other | Attending: Oncology

## 2021-10-15 ENCOUNTER — Other Ambulatory Visit: Payer: Self-pay

## 2021-10-15 DIAGNOSIS — Z79899 Other long term (current) drug therapy: Secondary | ICD-10-CM | POA: Diagnosis not present

## 2021-10-15 DIAGNOSIS — N179 Acute kidney failure, unspecified: Secondary | ICD-10-CM

## 2021-10-15 DIAGNOSIS — Z7189 Other specified counseling: Secondary | ICD-10-CM

## 2021-10-15 DIAGNOSIS — Z7982 Long term (current) use of aspirin: Secondary | ICD-10-CM | POA: Insufficient documentation

## 2021-10-15 DIAGNOSIS — D649 Anemia, unspecified: Secondary | ICD-10-CM

## 2021-10-15 DIAGNOSIS — N400 Enlarged prostate without lower urinary tract symptoms: Secondary | ICD-10-CM | POA: Insufficient documentation

## 2021-10-15 DIAGNOSIS — C9000 Multiple myeloma not having achieved remission: Secondary | ICD-10-CM | POA: Insufficient documentation

## 2021-10-15 DIAGNOSIS — I10 Essential (primary) hypertension: Secondary | ICD-10-CM | POA: Diagnosis not present

## 2021-10-15 LAB — COMPREHENSIVE METABOLIC PANEL
ALT: 11 U/L (ref 0–44)
AST: 13 U/L — ABNORMAL LOW (ref 15–41)
Albumin: 3.5 g/dL (ref 3.5–5.0)
Alkaline Phosphatase: 49 U/L (ref 38–126)
Anion gap: 10 (ref 5–15)
BUN: 18 mg/dL (ref 8–23)
CO2: 24 mmol/L (ref 22–32)
Calcium: 8.5 mg/dL — ABNORMAL LOW (ref 8.9–10.3)
Chloride: 107 mmol/L (ref 98–111)
Creatinine, Ser: 1.19 mg/dL (ref 0.61–1.24)
GFR, Estimated: 58 mL/min — ABNORMAL LOW (ref >60)
Glucose, Bld: 91 mg/dL (ref 70–99)
Potassium: 3.5 mmol/L (ref 3.5–5.1)
Sodium: 141 mmol/L (ref 135–145)
Total Bilirubin: 0.5 mg/dL (ref 0.3–1.2)
Total Protein: 6.6 g/dL (ref 6.5–8.1)

## 2021-10-15 LAB — CBC WITH DIFFERENTIAL/PLATELET
Abs Immature Granulocytes: 0 10*3/uL (ref 0.00–0.07)
Basophils Absolute: 0 10*3/uL (ref 0.0–0.1)
Basophils Relative: 1 %
Eosinophils Absolute: 0.2 10*3/uL (ref 0.0–0.5)
Eosinophils Relative: 8 %
HCT: 30.7 % — ABNORMAL LOW (ref 39.0–52.0)
Hemoglobin: 9.6 g/dL — ABNORMAL LOW (ref 13.0–17.0)
Immature Granulocytes: 0 %
Lymphocytes Relative: 42 %
Lymphs Abs: 1.1 10*3/uL (ref 0.7–4.0)
MCH: 25.7 pg — ABNORMAL LOW (ref 26.0–34.0)
MCHC: 31.3 g/dL (ref 30.0–36.0)
MCV: 82.3 fL (ref 80.0–100.0)
Monocytes Absolute: 0.4 10*3/uL (ref 0.1–1.0)
Monocytes Relative: 14 %
Neutro Abs: 0.9 10*3/uL — ABNORMAL LOW (ref 1.7–7.7)
Neutrophils Relative %: 35 %
Platelets: 94 10*3/uL — ABNORMAL LOW (ref 150–400)
RBC: 3.73 MIL/uL — ABNORMAL LOW (ref 4.22–5.81)
RDW: 15.9 % — ABNORMAL HIGH (ref 11.5–15.5)
WBC: 2.6 10*3/uL — ABNORMAL LOW (ref 4.0–10.5)
nRBC: 0 % (ref 0.0–0.2)

## 2021-10-16 ENCOUNTER — Other Ambulatory Visit: Payer: Self-pay | Admitting: *Deleted

## 2021-10-16 DIAGNOSIS — C9000 Multiple myeloma not having achieved remission: Secondary | ICD-10-CM

## 2021-10-16 LAB — KAPPA/LAMBDA LIGHT CHAINS
Kappa free light chain: 54 mg/L — ABNORMAL HIGH (ref 3.3–19.4)
Kappa, lambda light chain ratio: 2.43 — ABNORMAL HIGH (ref 0.26–1.65)
Lambda free light chains: 22.2 mg/L (ref 5.7–26.3)

## 2021-10-17 ENCOUNTER — Other Ambulatory Visit: Payer: Self-pay | Admitting: *Deleted

## 2021-10-17 DIAGNOSIS — C9000 Multiple myeloma not having achieved remission: Secondary | ICD-10-CM

## 2021-10-17 MED ORDER — LENALIDOMIDE 10 MG PO CAPS: ORAL_CAPSULE | ORAL | 6 refills | Status: DC

## 2021-10-20 LAB — MULTIPLE MYELOMA PANEL, SERUM
Albumin SerPl Elph-Mcnc: 3.4 g/dL (ref 2.9–4.4)
Albumin/Glob SerPl: 1.3 (ref 0.7–1.7)
Alpha 1: 0.2 g/dL (ref 0.0–0.4)
Alpha2 Glob SerPl Elph-Mcnc: 0.6 g/dL (ref 0.4–1.0)
B-Globulin SerPl Elph-Mcnc: 0.8 g/dL (ref 0.7–1.3)
Gamma Glob SerPl Elph-Mcnc: 1.2 g/dL (ref 0.4–1.8)
Globulin, Total: 2.8 g/dL (ref 2.2–3.9)
IgA: 170 mg/dL (ref 61–437)
IgG (Immunoglobin G), Serum: 1250 mg/dL (ref 603–1613)
IgM (Immunoglobulin M), Srm: 32 mg/dL (ref 15–143)
Total Protein ELP: 6.2 g/dL (ref 6.0–8.5)

## 2021-10-22 ENCOUNTER — Inpatient Hospital Stay (HOSPITAL_BASED_OUTPATIENT_CLINIC_OR_DEPARTMENT_OTHER): Payer: Medicare Other | Admitting: Oncology

## 2021-10-22 VITALS — BP 115/56 | HR 75 | Temp 98.1°F | Resp 17 | Wt 179.5 lb

## 2021-10-22 DIAGNOSIS — Z7982 Long term (current) use of aspirin: Secondary | ICD-10-CM | POA: Diagnosis not present

## 2021-10-22 DIAGNOSIS — I10 Essential (primary) hypertension: Secondary | ICD-10-CM | POA: Diagnosis not present

## 2021-10-22 DIAGNOSIS — Z79899 Other long term (current) drug therapy: Secondary | ICD-10-CM | POA: Diagnosis not present

## 2021-10-22 DIAGNOSIS — N400 Enlarged prostate without lower urinary tract symptoms: Secondary | ICD-10-CM | POA: Diagnosis not present

## 2021-10-22 DIAGNOSIS — C9000 Multiple myeloma not having achieved remission: Secondary | ICD-10-CM

## 2021-10-22 NOTE — Progress Notes (Signed)
[place]  [phone] [phone]     ID: Joe [name] DOB: Jul 08, 1929  MR#: [id number]  HAL#:[id number]  Patient Care Team: [name], MD as PCP - General (Internal Medicine) Vlad Mayberry, Virgie Dad, MD as Consulting Physician (Oncology) Long-Stokes, Benson Setting, MD OTHER MD:  CHIEF COMPLAINT: multiple myeloma  CURRENT TREATMENT: denosumab Xgeva every 12 weeks; lenalidomide 10 mg 21/7   INTERVAL HISTORY: Joe [name] returns today for follow up and treatment of his multiple myeloma.   He continues on lenalidomide, $RemoveBeforeDE'10mg'gaJLImEtFBiULzA$  daily 21 days on and 7 day off.  He tolerates this well, with minimal constipation, no fatigue or nausea, no neuropathy and no sleepiness.  He does say however he feels a little bit better on the off week.  We are following his M spike and kappa lambda ratio.  The M spike remains not observed and the immunofixation pattern is unremarkable with no evidence of a monoclonal protein.   Lab Results  Component Value Date   KAPLAMBRATIO 2.43 (H) 10/15/2021   KAPLAMBRATIO 2.16 (H) 09/08/2021   KAPLAMBRATIO 1.53 06/09/2021   KAPLAMBRATIO 2.57 (H) 04/17/2021   KAPLAMBRATIO 2.07 (H) 03/20/2021   He receives denosumab/Xgeva every 12 weeks, most recently given on 09/11/2021.  His next dose is due 12/04/2020  REVIEW OF SYSTEMS: Joe [name] is feeling well this week because this is his "off week".  Even when he is on the lenalidomide however he is quite active in the garden at his church and of course he does all the housework shopping and cooking since his wife now is significantly declining and frequently does not even recognize him.  He he says his daughter does help and they are just beginning to look for possible places his wife may need to go to (I suggested [place] and [place] but of course there are many options some better than others).  He has a baseline peripheral neuropathy in his feet, not his hands, and that is stable.   He takes MiraLAX as needed for constipation.  He had been taking the Revlimid in the morning but I am suggesting he take it in the evening.  Otherwise a detailed review of systems today was stable   COVID 19 VACCINATION STATUS: received [place] x2 with booster October 2021.  He received evusheld on 03/20/2021 and is due a second dose   HISTORY OF CURRENT ILLNESS: From the original intake note:  Joe [name] is an 87 y/o [place] man formerly followed by my partner Dr Julien Nordmann for M-GUS. We have an M-Spike of 0.87 documented 07/12/2012. [name] kappa/lambda light chains and total IgG were follower yearly until 01/2016, when they were 2.36 and 1517 respectively (unremarkable). He was lost to follow-up after that point.  Yesterday [02/29/2020] the patient presented to Urgent Care with poorly controlled pain. This was felt to be mussculoskeletal and he was started on a medrol dose-pak and robaxin. However his pain worsened and he presented to the ED at [facility] where vitals and exam were unremarkable but labs showed a creatinine of 1.84 (baseline 1.0), calcium 12.7 with albumin 2.8 and total protein 10.4 (globulin fraction 7.8). CT of the abdomen obtained for evaluation of his abdominal and back pain showed no hydronephrosis but multiple lytic lesons. We were consulted for further evaluation and treatment of likely multiple myeloma and he was admitted to the hospital.    The patient's subsequent history is as detailed below.   PAST MEDICAL HISTORY: Past Medical History:  Diagnosis Date  Enlarged prostate    Hypertension    Melanoma ([place]) 2021    PAST SURGICAL HISTORY: Past Surgical History:  Procedure Laterality Date   EYE SURGERY     PROSTATE SURGERY      FAMILY HISTORY Family History  Problem Relation Age of Onset   Hypertension Mother     SOCIAL HISTORY:  Retired, used to work for the [employer] Lives with wife [place]. Has 4 children, 2 in [place], one  in [place], one in [place]; 5 grandchildren and 5 great-grandchildren. Attends a [employer].    ADVANCED DIRECTIVES: at the initial visit the patient confirmed his wife is his HCPOA (despite her memory issues).   HEALTH MAINTENANCE: Social History   Tobacco Use   Smoking status: Never   Smokeless tobacco: Never  Vaping Use   Vaping Use: Never used  Substance Use Topics   Alcohol use: Never   Drug use: Never     Colonoscopy: 6-8 years ago     No Known Allergies  Current Outpatient Medications  Medication Sig Dispense Refill   acyclovir (ZOVIRAX) 400 MG tablet Take 1 tablet (400 mg total) by mouth 2 (two) times daily. 180 tablet 3   amLODipine (NORVASC) 5 MG tablet Take 5 mg by mouth daily.     aspirin EC 81 MG tablet Take 81 mg by mouth daily. Swallow whole.      calcium carbonate (TUMS - DOSED IN MG ELEMENTAL CALCIUM) 500 MG chewable tablet Chew 3 tablets by mouth daily.     doxazosin (CARDURA) 4 MG tablet Take 4 mg by mouth at bedtime.     famotidine (PEPCID) 20 MG tablet Take 1 tablet by mouth once daily 90 tablet 0   finasteride (PROSCAR) 5 MG tablet Take 1 tablet by mouth once daily 30 tablet 0   gabapentin (NEURONTIN) 100 MG capsule Take 2 capsules (200 mg total) by mouth at bedtime. 90 capsule 4   indapamide (LOZOL) 2.5 MG tablet Take 2.5 mg by mouth daily.      lenalidomide (REVLIMID) 10 MG capsule Take 1 tablet daily for 21 days then 7 days off every 28 days Adult male [employer] auth # [id number] obtained09/20/2022 21 capsule 6   ondansetron (ZOFRAN) 8 MG tablet TAKE 1 TABLET BY MOUTH THREE TIMES DAILY AS NEEDED FOR NAUSEA OR VOMITING 20 tablet 0   polyethylene glycol powder (MIRALAX) 17 GM/SCOOP powder Take 255 g by mouth daily. 255 g 0   potassium chloride (KLOR-CON) 10 MEQ tablet Take 2 tablets by mouth once daily 180 tablet 0   pravastatin (PRAVACHOL) 40 MG tablet Take 40 mg by mouth daily.     prochlorperazine (COMPAZINE) 10 MG tablet TAKE 1 TABLET BY MOUTH  EVERY 6 HOURS AS NEEDED FOR NAUSEA AND VOMITING 30 tablet 0   tamsulosin (FLOMAX) 0.4 MG CAPS capsule TAKE 1 CAPSULE BY MOUTH ONCE DAILY AFTER SUPPER 30 capsule 0   traMADol (ULTRAM) 50 MG tablet TAKE 1 TABLET BY MOUTH EVERY 8 HOURS AS NEEDED. 30 tablet 0   vitamin B-12 (CYANOCOBALAMIN) 1000 MCG tablet Take 1,000 mcg by mouth daily.     No current facility-administered medications for this visit.    OBJECTIVE: African-American man in no acute distress Vitals:   10/22/21 1608  BP: (!) 115/56  Pulse: 75  Resp: 17  Temp: 98.1 F (36.7 C)  SpO2: 99%       Body mass index is 25.04 kg/m.   Wt Readings from Last 3 Encounters:  10/22/21 179 lb 8 oz (81.4 kg)  09/11/21 176 lb 8 oz (80.1 kg)  06/12/21 180 lb 4.8 oz (81.8 kg)  ECOG FS:1 - Symptomatic but completely ambulatory  Sclerae unicteric, EOMs intact Wearing a mask No cervical or supraclavicular adenopathy Lungs no rales or rhonchi Heart regular rate and rhythm Abd soft, nontender, positive bowel sounds MSK no focal spinal tenderness to thorough and vigorous palpation and percussion Neuro: nonfocal, well oriented, appropriate affect   LAB RESULTS:  CMP     Component Value Date/Time   NA 141 10/15/2021 1444   NA 140 01/09/2016 1045   K 3.5 10/15/2021 1444   K 3.8 01/09/2016 1045   CL 107 10/15/2021 1444   CO2 24 10/15/2021 1444   CO2 27 01/09/2016 1045   GLUCOSE 91 10/15/2021 1444   GLUCOSE 89 01/09/2016 1045   BUN 18 10/15/2021 1444   BUN 14.7 01/09/2016 1045   CREATININE 1.19 10/15/2021 1444   CREATININE 0.97 09/11/2021 1201   CREATININE 1.0 01/09/2016 1045   CALCIUM 8.5 (L) 10/15/2021 1444   CALCIUM 9.2 01/09/2016 1045   PROT 6.6 10/15/2021 1444   PROT 7.3 01/09/2016 1045   ALBUMIN 3.5 10/15/2021 1444   ALBUMIN 3.7 01/09/2016 1045   AST 13 (L) 10/15/2021 1444   AST 16 09/11/2021 1201   AST 18 01/09/2016 1045   ALT 11 10/15/2021 1444   ALT 14 09/11/2021 1201   ALT 15 01/09/2016 1045   ALKPHOS 49  10/15/2021 1444   ALKPHOS 60 01/09/2016 1045   BILITOT 0.5 10/15/2021 1444   BILITOT 0.7 09/11/2021 1201   BILITOT 0.51 01/09/2016 1045   GFRNONAA 58 (L) 10/15/2021 1444   GFRNONAA >60 09/11/2021 1201   GFRAA >60 08/30/2020 1402    Lab Results  Component Value Date   TOTALPROTELP 6.2 10/15/2021   ALBUMINELP 56.1 07/12/2012   A1GS 3.5 07/12/2012   A2GS 9.8 07/12/2012   BETS 5.1 07/12/2012   BETA2SER 3.6 07/12/2012   GAMS 21.9 (H) 07/12/2012   MSPIKE 0.87 07/12/2012   SPEI * 07/12/2012     Lab Results  Component Value Date   KPAFRELGTCHN 54.0 (H) 10/15/2021   LAMBDASER 22.2 10/15/2021   KAPLAMBRATIO 2.43 (H) 10/15/2021    Lab Results  Component Value Date   WBC 2.6 (L) 10/15/2021   NEUTROABS 0.9 (L) 10/15/2021   HGB 9.6 (L) 10/15/2021   HCT 30.7 (L) 10/15/2021   MCV 82.3 10/15/2021   PLT 94 (L) 10/15/2021      Chemistry      Component Value Date/Time   NA 141 10/15/2021 1444   NA 140 01/09/2016 1045   K 3.5 10/15/2021 1444   K 3.8 01/09/2016 1045   CL 107 10/15/2021 1444   CO2 24 10/15/2021 1444   CO2 27 01/09/2016 1045   BUN 18 10/15/2021 1444   BUN 14.7 01/09/2016 1045   CREATININE 1.19 10/15/2021 1444   CREATININE 0.97 09/11/2021 1201   CREATININE 1.0 01/09/2016 1045      Component Value Date/Time   CALCIUM 8.5 (L) 10/15/2021 1444   CALCIUM 9.2 01/09/2016 1045   ALKPHOS 49 10/15/2021 1444   ALKPHOS 60 01/09/2016 1045   AST 13 (L) 10/15/2021 1444   AST 16 09/11/2021 1201   AST 18 01/09/2016 1045   ALT 11 10/15/2021 1444   ALT 14 09/11/2021 1201   ALT 15 01/09/2016 1045   BILITOT 0.5 10/15/2021 1444   BILITOT 0.7 09/11/2021 1201   BILITOT 0.51 01/09/2016 1045  No results found for: LABCA2  No components found for: PPJKDT267  No results for input(s): INR in the last 168 hours.  No results found for: LABCA2  No results found for: TIW580  No results found for: DXI338  No results found for: SNK539  No results found for:  CA2729  No components found for: HGQUANT  No results found for: CEA1 / No results found for: CEA1   No results found for: AFPTUMOR  No results found for: CHROMOGRNA  No results found for: HGBA, HGBA2QUANT, HGBFQUANT, HGBSQUAN (Hemoglobinopathy evaluation)   Lab Results  Component Value Date   LDH 136 01/09/2016    Lab Results  Component Value Date   IRON 89 04/17/2021   TIBC 245 04/17/2021   IRONPCTSAT 36 04/17/2021   (Iron and TIBC)  Lab Results  Component Value Date   FERRITIN 340 (H) 04/17/2021    Urinalysis    Component Value Date/Time   COLORURINE AMBER (A) 03/01/2020 1052   APPEARANCEUR CLOUDY (A) 03/01/2020 1052   LABSPEC 1.021 03/01/2020 1052   PHURINE 5.0 03/01/2020 1052   GLUCOSEU NEGATIVE 03/01/2020 1052   HGBUR NEGATIVE 03/01/2020 1052   BILIRUBINUR NEGATIVE 03/01/2020 1052   KETONESUR NEGATIVE 03/01/2020 1052   PROTEINUR 100 (A) 03/01/2020 1052   UROBILINOGEN 0.2 02/29/2020 1537   NITRITE NEGATIVE 03/01/2020 [place] 03/01/2020 1052    STUDIES: No results found.   ELIGIBLE FOR AVAILABLE RESEARCH PROTOCOL:   ASSESSMENT: 87 y.o.  [address] [place] man with a history of M-GUS, presenting 02/29/2020 with a high globulin fraction, worsening renal function, hypercalcemia, anemia and multiple lytic bone lesions.    (1) IgG Kappa Multiple Myeloma: labs diagnostic on 03/01/2020 with Mspike of 3.8, Kappa free light chain 690.6, and ratio 117.05.  (a) CT A/P on 03/01/2020 shows "innumerable" lytic lesions involving all visualized bones  (b) bone marrow biopsy 03/11/2020 shows 20% plasmacytosis by CD138, 12% by manual differential; molecular studies not requested  (c) Bortezomib and Dexamethasone given weekly 3 weeks on and 1 week off beginning 03/11/2020, with good response, last dose 07/24/2020  (d) transitioned to lenalidomide 10 mg daily 21/7 starting 07/31/2020    (2) lytic bone lesions/ hypercalcemia:  (a) Pamidronate administered on  03/01/2020  (b) denosumab/Xgeva started 04/17/2020-- repeat every 12 weeks   (a) to take TUMS 3 tabs on day of Xgeva dose   PLAN:  Joe [name] is now a year and a half out from diagnosis of multiple myeloma.  His disease is very well controlled on maintenance lenalidomide.  He is tolerating this generally well.  He complained of a little bit of back pain.  Exam today was benign.  We have set him up for a bone density in December.  His next denosumab/Xgeva dose will be due 12/04/2021.  He could receive his next evusheld dose on the same day.  Unfortunately his wife continues to decline.  I think institutionalization is likely in the next few months.  We discussed some possible options for him  He knows to call for any other issue that may develop before the next visit  Total encounter time 25 minutes.Sarajane Jews C. Lark Runk, MD 10/22/21 4:21 PM Medical Oncology and Hematology [facility] [place], [place] [address] Tel. [phone]    Fax. [phone]   I, Wilburn Mylar, am acting as scribe for Dr. Virgie Dad. Trevontae Lindahl.  Lindie Spruce MD, have reviewed the above documentation for accuracy and completeness, and I  agree with the above.   *Total Encounter Time as defined by the Centers for Medicare and Medicaid Services includes, in addition to the face-to-face time of a patient visit (documented in the note above) non-face-to-face time: obtaining and reviewing outside history, ordering and reviewing medications, tests or procedures, care coordination (communications with other health care professionals or caregivers) and documentation in the medical record.

## 2021-10-27 ENCOUNTER — Other Ambulatory Visit: Payer: Self-pay | Admitting: Oncology

## 2021-10-30 ENCOUNTER — Telehealth: Payer: Self-pay | Admitting: Oncology

## 2021-10-30 NOTE — Telephone Encounter (Signed)
Sch per 11/13 los, pt aware

## 2021-11-10 ENCOUNTER — Other Ambulatory Visit: Payer: Self-pay | Admitting: Oncology

## 2021-11-10 DIAGNOSIS — D472 Monoclonal gammopathy: Secondary | ICD-10-CM

## 2021-11-13 ENCOUNTER — Encounter: Payer: Self-pay | Admitting: Oncology

## 2021-11-17 ENCOUNTER — Other Ambulatory Visit: Payer: Self-pay

## 2021-11-17 DIAGNOSIS — C9000 Multiple myeloma not having achieved remission: Secondary | ICD-10-CM

## 2021-11-17 MED ORDER — LENALIDOMIDE 10 MG PO CAPS: ORAL_CAPSULE | ORAL | 6 refills | Status: DC

## 2021-11-21 ENCOUNTER — Other Ambulatory Visit: Payer: Self-pay | Admitting: Oncology

## 2021-11-25 ENCOUNTER — Encounter: Payer: Self-pay | Admitting: Oncology

## 2021-11-27 ENCOUNTER — Inpatient Hospital Stay: Payer: Medicare Other | Attending: Oncology

## 2021-11-27 ENCOUNTER — Ambulatory Visit (HOSPITAL_COMMUNITY)
Admission: RE | Admit: 2021-11-27 | Discharge: 2021-11-27 | Disposition: A | Discharge status: Home / Self Care | Payer: Medicare Other | Source: Ambulatory Visit | Attending: Oncology | Admitting: Oncology

## 2021-11-27 ENCOUNTER — Other Ambulatory Visit: Payer: Self-pay

## 2021-11-27 DIAGNOSIS — Z79899 Other long term (current) drug therapy: Secondary | ICD-10-CM | POA: Insufficient documentation

## 2021-11-27 DIAGNOSIS — I10 Essential (primary) hypertension: Secondary | ICD-10-CM | POA: Insufficient documentation

## 2021-11-27 DIAGNOSIS — Z7982 Long term (current) use of aspirin: Secondary | ICD-10-CM | POA: Insufficient documentation

## 2021-11-27 DIAGNOSIS — Z7189 Other specified counseling: Secondary | ICD-10-CM

## 2021-11-27 DIAGNOSIS — N400 Enlarged prostate without lower urinary tract symptoms: Secondary | ICD-10-CM | POA: Diagnosis not present

## 2021-11-27 DIAGNOSIS — C9000 Multiple myeloma not having achieved remission: Secondary | ICD-10-CM | POA: Insufficient documentation

## 2021-11-27 DIAGNOSIS — N179 Acute kidney failure, unspecified: Secondary | ICD-10-CM

## 2021-11-27 DIAGNOSIS — D649 Anemia, unspecified: Secondary | ICD-10-CM

## 2021-11-27 LAB — CBC WITH DIFFERENTIAL/PLATELET
Abs Immature Granulocytes: 0.01 10*3/uL (ref 0.00–0.07)
Basophils Absolute: 0.1 10*3/uL (ref 0.0–0.1)
Basophils Relative: 2 %
Eosinophils Absolute: 0.2 10*3/uL (ref 0.0–0.5)
Eosinophils Relative: 6 %
HCT: 31.8 % — ABNORMAL LOW (ref 39.0–52.0)
Hemoglobin: 9.8 g/dL — ABNORMAL LOW (ref 13.0–17.0)
Immature Granulocytes: 0 %
Lymphocytes Relative: 29 %
Lymphs Abs: 1 10*3/uL (ref 0.7–4.0)
MCH: 25 pg — ABNORMAL LOW (ref 26.0–34.0)
MCHC: 30.8 g/dL (ref 30.0–36.0)
MCV: 81.1 fL (ref 80.0–100.0)
Monocytes Absolute: 0.7 10*3/uL (ref 0.1–1.0)
Monocytes Relative: 20 %
Neutro Abs: 1.5 10*3/uL — ABNORMAL LOW (ref 1.7–7.7)
Neutrophils Relative %: 43 %
Platelets: 137 10*3/uL — ABNORMAL LOW (ref 150–400)
RBC: 3.92 MIL/uL — ABNORMAL LOW (ref 4.22–5.81)
RDW: 14.5 % (ref 11.5–15.5)
WBC: 3.4 10*3/uL — ABNORMAL LOW (ref 4.0–10.5)
nRBC: 0 % (ref 0.0–0.2)

## 2021-11-27 LAB — COMPREHENSIVE METABOLIC PANEL
ALT: 10 U/L (ref 0–44)
AST: 13 U/L — ABNORMAL LOW (ref 15–41)
Albumin: 3.7 g/dL (ref 3.5–5.0)
Alkaline Phosphatase: 51 U/L (ref 38–126)
Anion gap: 6 (ref 5–15)
BUN: 23 mg/dL (ref 8–23)
CO2: 27 mmol/L (ref 22–32)
Calcium: 8.7 mg/dL — ABNORMAL LOW (ref 8.9–10.3)
Chloride: 105 mmol/L (ref 98–111)
Creatinine, Ser: 1.1 mg/dL (ref 0.61–1.24)
GFR, Estimated: >60 mL/min (ref >60)
Glucose, Bld: 90 mg/dL (ref 70–99)
Potassium: 3.6 mmol/L (ref 3.5–5.1)
Sodium: 138 mmol/L (ref 135–145)
Total Bilirubin: 0.7 mg/dL (ref 0.3–1.2)
Total Protein: 6.9 g/dL (ref 6.5–8.1)

## 2021-11-28 LAB — KAPPA/LAMBDA LIGHT CHAINS
Kappa free light chain: 36.2 mg/L — ABNORMAL HIGH (ref 3.3–19.4)
Kappa, lambda light chain ratio: 1.73 — ABNORMAL HIGH (ref 0.26–1.65)
Lambda free light chains: 20.9 mg/L (ref 5.7–26.3)

## 2021-12-02 LAB — MULTIPLE MYELOMA PANEL, SERUM
Albumin SerPl Elph-Mcnc: 3.3 g/dL (ref 2.9–4.4)
Albumin/Glob SerPl: 1.2 (ref 0.7–1.7)
Alpha 1: 0.2 g/dL (ref 0.0–0.4)
Alpha2 Glob SerPl Elph-Mcnc: 0.6 g/dL (ref 0.4–1.0)
B-Globulin SerPl Elph-Mcnc: 0.8 g/dL (ref 0.7–1.3)
Gamma Glob SerPl Elph-Mcnc: 1.2 g/dL (ref 0.4–1.8)
Globulin, Total: 2.8 g/dL (ref 2.2–3.9)
IgA: 159 mg/dL (ref 61–437)
IgG (Immunoglobin G), Serum: 1336 mg/dL (ref 603–1613)
IgM (Immunoglobulin M), Srm: 29 mg/dL (ref 15–143)
Total Protein ELP: 6.1 g/dL (ref 6.0–8.5)

## 2021-12-04 ENCOUNTER — Inpatient Hospital Stay: Payer: Medicare Other

## 2021-12-04 ENCOUNTER — Inpatient Hospital Stay: Payer: Medicare Other | Attending: Oncology | Admitting: Hematology and Oncology

## 2021-12-04 ENCOUNTER — Encounter: Payer: Self-pay | Admitting: Hematology and Oncology

## 2021-12-04 ENCOUNTER — Other Ambulatory Visit: Payer: Self-pay

## 2021-12-04 VITALS — BP 137/48 | HR 67 | Temp 97.6°F | Resp 16 | Ht 71.0 in | Wt 174.2 lb

## 2021-12-04 DIAGNOSIS — M549 Dorsalgia, unspecified: Secondary | ICD-10-CM | POA: Insufficient documentation

## 2021-12-04 DIAGNOSIS — Z7982 Long term (current) use of aspirin: Secondary | ICD-10-CM | POA: Diagnosis not present

## 2021-12-04 DIAGNOSIS — D649 Anemia, unspecified: Secondary | ICD-10-CM | POA: Diagnosis not present

## 2021-12-04 DIAGNOSIS — I10 Essential (primary) hypertension: Secondary | ICD-10-CM | POA: Insufficient documentation

## 2021-12-04 DIAGNOSIS — C9000 Multiple myeloma not having achieved remission: Secondary | ICD-10-CM

## 2021-12-04 DIAGNOSIS — Z79899 Other long term (current) drug therapy: Secondary | ICD-10-CM | POA: Diagnosis not present

## 2021-12-04 DIAGNOSIS — Z7189 Other specified counseling: Secondary | ICD-10-CM

## 2021-12-04 MED ORDER — DENOSUMAB 120 MG/1.7ML ~~LOC~~ SOLN
120.0000 mg | Freq: Once | SUBCUTANEOUS | Status: AC
  Administered 2021-12-04: 120 mg via SUBCUTANEOUS
  Filled 2021-12-04: qty 1.7

## 2021-12-04 NOTE — Progress Notes (Signed)
Per Dr, Chryl Heck it was okay to proceed with Xgeva injection today using lab results from 11/27/21. Calcium level was 8.7.

## 2021-12-04 NOTE — Progress Notes (Signed)
[facility]  Telephone:([phone]) [phone] Fax:(336) [phone]     ID: Joe [name] DOB: [birth year]  MR#: [id number]  VQX#:[id number]  Patient Care Team: Joe Seashore, MD as PCP - General (Internal Medicine) Magrinat, Virgie Dad, MD as Consulting Physician (Oncology) Long-Stokes, Adalberto Ill, MD OTHER MD:  CHIEF COMPLAINT: multiple myeloma  CURRENT TREATMENT: denosumab Xgeva every 12 weeks; lenalidomide 10 mg 21/7   INTERVAL HISTORY: Joe [name] returns today for follow up and treatment of his multiple myeloma.   He continues on lenalidomide, 3m daily 21 days on and 7 day off.  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  He also says he feels a bit tired but still able to perform well and do all his activities of daily living. He has not been taking his aspirin regularly.  He otherwise denies any new health complaints.  No interim infections or hospitalizations.  We are following his M spike and kappa lambda ratio.  The M spike remains not observed and the immunofixation pattern is unremarkable with no evidence of a monoclonal protein.   Lab Results  Component Value Date   KAPLAMBRATIO 1.73 (H) 11/27/2021   KAPLAMBRATIO 2.43 (H) 10/15/2021   KAPLAMBRATIO 2.16 (H) 09/08/2021   KAPLAMBRATIO 1.53 06/09/2021   KAPLAMBRATIO 2.57 (H) 04/17/2021   He receives denosumab/Xgeva every 12 weeks, due today.  REVIEW OF SYSTEMS: Rest of the pertinent 10 point ROS reviewed and negative   COVID 19 VACCINATION STATUS: received PLemon [place]x2 with booster October 2021.  He received evusheld on 03/20/2021 and is due a second dose   HISTORY OF CURRENT ILLNESS:  From the original intake note:  Joe [name] an 88y/o GGuyanaman formerly followed by my partner Dr Joe Nordmannfor M-GUS. We have an M-Spike of 0.87 documented 07/12/2012. [name] kappa/lambda light chains and total IgG were follower yearly until  01/2016, when they were 2.36 and 1517 respectively (unremarkable). He was lost to follow-up after that point.  Yesterday [02/29/2020] the patient presented to Urgent Care with poorly controlled pain. This was felt to be mussculoskeletal and he was started on a medrol dose-pak and robaxin. However his pain worsened and [facility] but labs showed a creatinine of 1.84 (baseline 1.0), calcium 12.7 with albumin 2.8 and total protein 10.4 (globulin fraction 7.8). CT of the abdomen obtained for evaluation of his abdominal and back pain showed no hydronephrosis but multiple lytic lesons. We were consulted for further evaluation and treatment of likely multiple myeloma and he was admitted to the hospital.     The patient's subsequent history is as detailed below.  PAST MEDICAL HISTORY: Past Medical History:  Diagnosis Date   Enlarged prostate    Hypertension    [place]    PAST SURGICAL HISTORY: Past Surgical History:  Procedure Laterality Date   EYE SURGERY     PROSTATE SURGERY      FAMILY HISTORY Family History  Problem Relation Age of Onset   Hypertension Mother     SOCIAL HISTORY:  Retired, used to work for the [employer] Lives with wife Joe [name] [place] 4 [place], [place] 5 grandchildren and 5 great-grandchildren. Attends a [employer]    ADVANCED DIRECTIVES: at the initial visit the patient confirmed his wife is his HCPOA (despite her memory  issues).   HEALTH MAINTENANCE: Social History   Tobacco Use   Smoking status: Never   Smokeless tobacco: Never  Vaping Use   Vaping Use: Never used  Substance Use Topics   Alcohol use: Never   Drug use: Never     Colonoscopy: 6-8 years ago     No Known Allergies  Current Outpatient Medications  Medication Sig Dispense Refill   acyclovir (ZOVIRAX) 400 MG tablet Take 1 tablet  (400 mg total) by mouth 2 (two) times daily. 180 tablet 3   amLODipine (NORVASC) 5 MG tablet Take 5 mg by mouth daily.     aspirin EC 81 MG tablet Take 81 mg by mouth daily. Swallow whole.      calcium carbonate (TUMS - DOSED IN MG ELEMENTAL CALCIUM) 500 MG chewable tablet Chew 3 tablets by mouth daily.     doxazosin (CARDURA) 4 MG tablet Take 4 mg by mouth at bedtime.     famotidine (PEPCID) 20 MG tablet Take 1 tablet by mouth once daily 90 tablet 0   finasteride (PROSCAR) 5 MG tablet Take 1 tablet by mouth once daily 30 tablet 0   gabapentin (NEURONTIN) 100 MG capsule Take 2 capsules (200 mg total) by mouth at bedtime. 90 capsule 4   indapamide (LOZOL) 2.5 MG tablet Take 2.5 mg by mouth daily.      lenalidomide (REVLIMID) 10 MG capsule Take 1 tablet daily for 21 days then 7 days off every 28 days Adult male [employer] auth # [id number] obtained09/20/2022 21 capsule 6   ondansetron (ZOFRAN) 8 MG tablet TAKE 1 TABLET BY MOUTH THREE TIMES DAILY AS NEEDED FOR NAUSEA OR VOMITING 20 tablet 0   polyethylene glycol powder (MIRALAX) 17 GM/SCOOP powder Take 255 g by mouth daily. 255 g 0   potassium chloride (KLOR-CON) 10 MEQ tablet Take 2 tablets by mouth once daily 180 tablet 0   pravastatin (PRAVACHOL) 40 MG tablet Take 40 mg by mouth daily.     prochlorperazine (COMPAZINE) 10 MG tablet TAKE 1 TABLET BY MOUTH EVERY 6 HOURS AS NEEDED FOR NAUSEA AND VOMITING 30 tablet 0   tamsulosin (FLOMAX) 0.4 MG CAPS capsule TAKE 1 CAPSULE BY MOUTH ONCE DAILY AFTER SUPPER 90 capsule 0   traMADol (ULTRAM) 50 MG tablet TAKE 1 TABLET BY MOUTH EVERY 8 HOURS AS NEEDED. 30 tablet 0   vitamin B-12 (CYANOCOBALAMIN) 1000 MCG tablet Take 1,000 mcg by mouth daily.     No current facility-administered medications for this visit.    OBJECTIVE: African-American man in no acute distress Vitals:   12/04/21 1442  BP: (!) 137/48  Pulse: 67  Resp: 16  Temp: 97.6 F (36.4 C)  SpO2: 100%       Body mass index is 24.3 kg/m.   Wt  Readings from Last 3 Encounters:  12/04/21 174 lb 3.2 oz (79 kg)  10/22/21 179 lb 8 oz (81.4 kg)  09/11/21 176 lb 8 oz (80.1 kg)  ECOG FS:1 - Symptomatic but completely ambulatory  Sclerae unicteric, EOMs intact Wearing a mask No cervical or supraclavicular adenopathy Lungs no rales or rhonchi Heart regular rate and rhythm Abd soft, nontender, positive bowel sounds  Neuro: nonfocal, well oriented, appropriate affect   LAB RESULTS:  CMP     Component Value Date/Time   NA 138 11/27/2021 1023   NA 140 01/09/2016 1045   K 3.6 11/27/2021 1023   K 3.8 01/09/2016 1045   CL 105 11/27/2021 1023   CO2 27 11/27/2021 1023  CO2 27 01/09/2016 1045   GLUCOSE 90 11/27/2021 1023   GLUCOSE 89 01/09/2016 1045   BUN 23 11/27/2021 1023   BUN 14.7 01/09/2016 1045   CREATININE 1.10 11/27/2021 1023   CREATININE 0.97 09/11/2021 1201   CREATININE 1.0 01/09/2016 1045   CALCIUM 8.7 (L) 11/27/2021 1023   CALCIUM 9.2 01/09/2016 1045   PROT 6.9 11/27/2021 1023   PROT 7.3 01/09/2016 1045   ALBUMIN 3.7 11/27/2021 1023   ALBUMIN 3.7 01/09/2016 1045   AST 13 (L) 11/27/2021 1023   AST 16 09/11/2021 1201   AST 18 01/09/2016 1045   ALT 10 11/27/2021 1023   ALT 14 09/11/2021 1201   ALT 15 01/09/2016 1045   ALKPHOS 51 11/27/2021 1023   ALKPHOS 60 01/09/2016 1045   BILITOT 0.7 11/27/2021 1023   BILITOT 0.7 09/11/2021 1201   BILITOT 0.51 01/09/2016 1045   GFRNONAA >60 11/27/2021 1023   GFRNONAA >60 09/11/2021 1201   GFRAA >60 08/30/2020 1402    Lab Results  Component Value Date   TOTALPROTELP 6.1 11/27/2021   ALBUMINELP 56.1 07/12/2012   A1GS 3.5 07/12/2012   A2GS 9.8 07/12/2012   BETS 5.1 07/12/2012   BETA2SER 3.6 07/12/2012   GAMS 21.9 (H) 07/12/2012   MSPIKE 0.87 07/12/2012   SPEI * 07/12/2012     Lab Results  Component Value Date   KPAFRELGTCHN 36.2 (H) 11/27/2021   LAMBDASER 20.9 11/27/2021   KAPLAMBRATIO 1.73 (H) 11/27/2021    Lab Results  Component Value Date   WBC 3.4  (L) 11/27/2021   NEUTROABS 1.5 (L) 11/27/2021   HGB 9.8 (L) 11/27/2021   HCT 31.8 (L) 11/27/2021   MCV 81.1 11/27/2021   PLT 137 (L) 11/27/2021      Chemistry      Component Value Date/Time   NA 138 11/27/2021 1023   NA 140 01/09/2016 1045   K 3.6 11/27/2021 1023   K 3.8 01/09/2016 1045   CL 105 11/27/2021 1023   CO2 27 11/27/2021 1023   CO2 27 01/09/2016 1045   BUN 23 11/27/2021 1023   BUN 14.7 01/09/2016 1045   CREATININE 1.10 11/27/2021 1023   CREATININE 0.97 09/11/2021 1201   CREATININE 1.0 01/09/2016 1045      Component Value Date/Time   CALCIUM 8.7 (L) 11/27/2021 1023   CALCIUM 9.2 01/09/2016 1045   ALKPHOS 51 11/27/2021 1023   ALKPHOS 60 01/09/2016 1045   AST 13 (L) 11/27/2021 1023   AST 16 09/11/2021 1201   AST 18 01/09/2016 1045   ALT 10 11/27/2021 1023   ALT 14 09/11/2021 1201   ALT 15 01/09/2016 1045   BILITOT 0.7 11/27/2021 1023   BILITOT 0.7 09/11/2021 1201   BILITOT 0.51 01/09/2016 1045       No results found for: LABCA2  No components found for: BPJPET624  No results for input(s): INR in the last 168 hours.  No results found for: LABCA2  No results found for: ECX507  No results found for: KUV750  No results found for: NXG335  No results found for: CA2729  No components found for: HGQUANT  No results found for: CEA1 / No results found for: CEA1   No results found for: AFPTUMOR  No results found for: CHROMOGRNA  No results found for: HGBA, HGBA2QUANT, HGBFQUANT, HGBSQUAN (Hemoglobinopathy evaluation)   Lab Results  Component Value Date   LDH 136 01/09/2016    Lab Results  Component Value Date   IRON 89 04/17/2021   TIBC 245  04/17/2021   IRONPCTSAT 36 04/17/2021   (Iron and TIBC)  Lab Results  Component Value Date   FERRITIN 340 (H) 04/17/2021    Urinalysis    Component Value Date/Time   COLORURINE AMBER (A) 03/01/2020 1052   APPEARANCEUR CLOUDY (A) 03/01/2020 1052   LABSPEC 1.021 03/01/2020 1052   PHURINE 5.0  03/01/2020 1052   GLUCOSEU NEGATIVE 03/01/2020 1052   HGBUR NEGATIVE 03/01/2020 1052   BILIRUBINUR NEGATIVE 03/01/2020 1052   KETONESUR NEGATIVE 03/01/2020 1052   PROTEINUR 100 (A) 03/01/2020 1052   UROBILINOGEN 0.2 02/29/2020 1537   NITRITE NEGATIVE 03/01/2020 1052   LEUKOCYTESUR NEGATIVE 03/01/2020 1052    STUDIES: DG Bone Survey Met  Result Date: 11/28/2021 CLINICAL DATA:  Multiple myeloma, back pain EXAM: METASTATIC BONE SURVEY COMPARISON:  07/19/2009, 07/31/2020 FINDINGS: Standard skeletal survey was performed. Lateral skull: No acute or destructive bony lesions. Upper extremities: Frontal views of the bilateral upper extremities are obtained from the shoulders through the wrists. Multiple small rounded lytic lesions are seen within the bilateral humeri and scapula, consistent with myeloma. No acute fractures. Spine: Frontal and lateral views of the spine are obtained. Alignment is grossly anatomic. Chronic vertebral plana at the T12 level. No acute fractures. No destructive bony lesions. Bones are osteopenic. Extensive multilevel cervical spondylosis. Chest: Unremarkable cardiac silhouette. Chronic elevation left hemidiaphragm. Lungs are clear. Chronic healed bilateral rib fractures. No destructive rib lesion. Pelvis: Frontal view the pelvis demonstrates numerous small lytic lesions throughout the bilateral iliac bones consistent with multiple myeloma. No acute fracture. Radiotherapy seeds within the prostate. Lower extremities: Frontal views of the bilateral lower extremities are obtained from the hips through the ankles. There are multiple small rounded lytic lesions within the bilateral proximal femurs consistent with multiple myeloma. No other acute bony abnormalities. IMPRESSION: 1. Multiple small rounded lytic lesions involving the bilateral humeri, bilateral femurs, bilateral scapula, and bony pelvis compatible with multiple myeloma. 2. Chronic T12 compression fracture and resulting  vertebra plana. Electronically Signed   By: Randa Ngo M.D.   On: 11/28/2021 02:50     ELIGIBLE FOR AVAILABLE RESEARCH PROTOCOL:   ASSESSMENT: 88 y.o.  [address] [place] man with a history of M-GUS, presenting 02/29/2020 with a high globulin fraction, worsening renal function, hypercalcemia, anemia and multiple lytic bone lesions.    (1) IgG Kappa Multiple Myeloma: labs diagnostic on 03/01/2020 with Mspike of 3.8, Kappa free light chain 690.6, and ratio 117.05.  (a) CT A/P on 03/01/2020 shows "innumerable" lytic lesions involving all visualized bones  (b) bone marrow biopsy 03/11/2020 shows 20% plasmacytosis by CD138, 12% by manual differential; molecular studies not requested  (c) Bortezomib and Dexamethasone given weekly 3 weeks on and 1 week off beginning 03/11/2020, with good response, last dose 07/24/2020  (d) transitioned to lenalidomide 10 mg daily 21/7 starting 07/31/2020    (2) lytic bone lesions/ hypercalcemia:  (a) Pamidronate administered on 03/01/2020  (b) denosumab/Xgeva started 04/17/2020-- repeat every 12 weeks   (a) to take TUMS 3 tabs on day of Xgeva dose   PLAN:  Joe [name] is now a year and a half out from diagnosis of multiple myeloma.  His disease is very well controlled on maintenance lenalidomide.  He is tolerating this generally well.  His most recent multiple myeloma panel appears to show that the disease continues to be in remission.  No monoclonal spike observed.  He will continue Revlimid as instructed, likely duration of Revlimid as 2 years.  We can reevaluate at that point if  he has to continue Revlimid for longer.  He understands that there is a small risk of blood clots with Revlimid and he needs to be compliant with aspirin.  We have also discussed that Revlimid can increase risk of secondary malignancies.  I have reviewed his most recent bone survey, I do not believe there is any bone lesion to explain his back pain.  This is likely musculoskeletal.  He continues on  denosumab every 12 weeks.  Okay to proceed with this today as planned.  He knows to call for any other issue that may develop before the next visit  Total encounter time 30 minutes.*    *Total Encounter Time as defined by the Centers for Medicare and Medicaid Services includes, in addition to the face-to-face time of a patient visit (documented in the note above) non-face-to-face time: obtaining and reviewing outside history, ordering and reviewing medications, tests or procedures, care coordination (communications with other health care professionals or caregivers) and documentation in the medical record.

## 2021-12-05 ENCOUNTER — Telehealth: Payer: Self-pay | Admitting: Hematology and Oncology

## 2021-12-05 DIAGNOSIS — Z23 Encounter for immunization: Secondary | ICD-10-CM | POA: Diagnosis not present

## 2021-12-05 NOTE — Telephone Encounter (Signed)
Scheduled appointment per 01/05 los. Patient aware. °

## 2021-12-06 ENCOUNTER — Other Ambulatory Visit: Payer: Self-pay | Admitting: Oncology

## 2021-12-06 DIAGNOSIS — C9000 Multiple myeloma not having achieved remission: Secondary | ICD-10-CM

## 2021-12-11 ENCOUNTER — Other Ambulatory Visit: Payer: Self-pay | Admitting: Oncology

## 2021-12-11 DIAGNOSIS — D472 Monoclonal gammopathy: Secondary | ICD-10-CM

## 2021-12-12 ENCOUNTER — Encounter: Payer: Self-pay | Admitting: Oncology

## 2021-12-18 ENCOUNTER — Other Ambulatory Visit: Payer: Self-pay | Admitting: *Deleted

## 2021-12-18 DIAGNOSIS — C9000 Multiple myeloma not having achieved remission: Secondary | ICD-10-CM

## 2021-12-18 MED ORDER — LENALIDOMIDE 10 MG PO CAPS: ORAL_CAPSULE | ORAL | 6 refills | Status: DC

## 2022-01-14 NOTE — Progress Notes (Signed)
[place]  Telephone:([phone] Fax:(336) [phone]     ID: [name] DOB: 1929/06/06  MR#: [id number]  CSN#:[id number]  Patient Care Team: [name], MD as PCP - General (Internal Medicine) Magrinat, Virgie Dad, MD (Inactive) as Consulting Physician (Oncology) Long-Stokes, Adalberto Ill, MD OTHER MD:  CHIEF COMPLAINT: multiple myeloma  CURRENT TREATMENT: denosumab Xgeva every 12 weeks; lenalidomide 10 mg 21/7   INTERVAL HISTORY: Joe [name] returns today for follow up and treatment of his multiple myeloma.   He continues on lenalidomide, 66m daily 21 days on and 7 day off.  He continues to do remarkably well, no adverse effects except for mild fatigue.  He is compliant with aspirin and daily.  He is active and independent with his activities of daily living.  No change in breathing, bowel habits or urinary habits. We are following his M spike and kappa lambda ratio.  The M spike remains not observed and the immunofixation pattern is unremarkable with no evidence of a monoclonal protein.   Lab Results  Component Value Date   KAPLAMBRATIO 1.73 (H) 11/27/2021   KAPLAMBRATIO 2.43 (H) 10/15/2021   KAPLAMBRATIO 2.16 (H) 09/08/2021   KAPLAMBRATIO 1.53 06/09/2021   KAPLAMBRATIO 2.57 (H) 04/17/2021   He receives denosumab/Xgeva every 12 weeks, due end of March 2023.  REVIEW OF SYSTEMS: Rest of the pertinent 10 point ROS reviewed and negative   COVID 19 VACCINATION STATUS: [place] with booster October 2021.  He received evusheld on 03/20/2021 and is due a second dose   HISTORY OF CURRENT ILLNESS:  From the original intake note:  Mr. FHarbuckis an 88y/o GGuyanaman formerly followed by my partner Dr MJulien Nordmannfor M-GUS. We have an M-Spike of 0.87 documented 07/12/2012. [name] kappa/lambda light chains and total IgG were follower yearly until 01/2016, when they were 2.36 and 1517 respectively (unremarkable). He was lost to follow-up after  that point.  [02/29/2020] the patient presented to Urgent Care with poorly controlled pain. This was felt to be mussculoskeletal and he was started on a medrol dose-pak and robaxin. However his pain worsened and [facility] but labs showed a creatinine of 1.84 (baseline 1.0), calcium 12.7 with albumin 2.8 and total protein 10.4 (globulin fraction 7.8).  CT of the abdomen obtained for evaluation of his abdominal and back pain showed no hydronephrosis but multiple lytic lesons.   The patient's subsequent history is as detailed below.  PAST MEDICAL HISTORY: Past Medical History:  Diagnosis Date   Enlarged prostate    Hypertension    [place]    PAST SURGICAL HISTORY: Past Surgical History:  Procedure Laterality Date   EYE SURGERY     PROSTATE SURGERY      FAMILY HISTORY Family History  Problem Relation Age of Onset   Hypertension Mother     SOCIAL HISTORY:  Retired, used to work for the [employer] Lives with wife [place] 4 children, [place] 5 grandchildren and 5 great-grandchildren. Attends a [employer]    ADVANCED DIRECTIVES: at the initial visit the patient confirmed his wife is his HCPOA (despite her memory issues).   HEALTH MAINTENANCE: Social History   Tobacco Use   Smoking status: Never   Smokeless tobacco: Never  Vaping Use   Vaping Use: Never used  Substance Use Topics   Alcohol use: Never  Drug use: Never     Colonoscopy: 6-8 years ago     No Known Allergies  Current Outpatient Medications  Medication Sig Dispense Refill   acyclovir (ZOVIRAX) 400 MG tablet Take 1 tablet (400 mg total) by mouth 2 (two) times daily. 180 tablet 3   amLODipine (NORVASC) 5 MG tablet Take 5 mg by mouth daily.     aspirin EC 81 MG tablet Take 81 mg by mouth daily. Swallow whole.      calcium carbonate (TUMS - DOSED  IN MG ELEMENTAL CALCIUM) 500 MG chewable tablet Chew 3 tablets by mouth daily.     doxazosin (CARDURA) 4 MG tablet Take 4 mg by mouth at bedtime.     famotidine (PEPCID) 20 MG tablet Take 1 tablet by mouth once daily 90 tablet 0   finasteride (PROSCAR) 5 MG tablet Take 1 tablet by mouth once daily 30 tablet 0   gabapentin (NEURONTIN) 100 MG capsule Take 2 capsules (200 mg total) by mouth at bedtime. 90 capsule 4   indapamide (LOZOL) 2.5 MG tablet Take 2.5 mg by mouth daily.      lenalidomide (REVLIMID) 10 MG capsule Take 1 tablet daily for 21 days then 7 days off every 28 days Adult male [employer] auth # [id number] obtained09/20/2022 21 capsule 6   ondansetron (ZOFRAN) 8 MG tablet TAKE 1 TABLET BY MOUTH THREE TIMES DAILY AS NEEDED FOR NAUSEA OR VOMITING 20 tablet 0   polyethylene glycol powder (MIRALAX) 17 GM/SCOOP powder Take 255 g by mouth daily. 255 g 0   potassium chloride (KLOR-CON) 10 MEQ tablet Take 2 tablets by mouth once daily 180 tablet 0   pravastatin (PRAVACHOL) 40 MG tablet Take 40 mg by mouth daily.     prochlorperazine (COMPAZINE) 10 MG tablet TAKE 1 TABLET BY MOUTH EVERY 6 HOURS AS NEEDED FOR NAUSEA AND VOMITING 30 tablet 0   tamsulosin (FLOMAX) 0.4 MG CAPS capsule TAKE 1 CAPSULE BY MOUTH ONCE DAILY AFTER SUPPER 90 capsule 0   traMADol (ULTRAM) 50 MG tablet TAKE 1 TABLET BY MOUTH EVERY 8 HOURS AS NEEDED. 30 tablet 0   No current facility-administered medications for this visit.    OBJECTIVE: African-American man in no acute distress There were no vitals filed for this visit.      There is no height or weight on file to calculate BMI.   Wt Readings from Last 3 Encounters:  12/04/21 174 lb 3.2 oz (79 kg)  10/22/21 179 lb 8 oz (81.4 kg)  09/11/21 176 lb 8 oz (80.1 kg)  ECOG FS:1 - Symptomatic but completely ambulatory  Sclerae unicteric, EOMs intact Wearing a mask No cervical or supraclavicular adenopathy Lungs no rales or rhonchi Heart regular rate and rhythm Abd soft,  nontender, positive bowel sounds  Neuro: nonfocal, well oriented, appropriate affect  LAB RESULTS:  CMP     Component Value Date/Time   NA 138 11/27/2021 1023   NA 140 01/09/2016 1045   K 3.6 11/27/2021 1023   K 3.8 01/09/2016 1045   CL 105 11/27/2021 1023   CO2 27 11/27/2021 1023   CO2 27 01/09/2016 1045   GLUCOSE 90 11/27/2021 1023   GLUCOSE 89 01/09/2016 1045   BUN 23 11/27/2021 1023   BUN 14.7 01/09/2016 1045   CREATININE 1.10 11/27/2021 1023   CREATININE 0.97 09/11/2021 1201   CREATININE 1.0 01/09/2016 1045   CALCIUM 8.7 (L) 11/27/2021 1023   CALCIUM 9.2 01/09/2016 1045   PROT 6.9 11/27/2021 1023   PROT  7.3 01/09/2016 1045   ALBUMIN 3.7 11/27/2021 1023   ALBUMIN 3.7 01/09/2016 1045   AST 13 (L) 11/27/2021 1023   AST 16 09/11/2021 1201   AST 18 01/09/2016 1045   ALT 10 11/27/2021 1023   ALT 14 09/11/2021 1201   ALT 15 01/09/2016 1045   ALKPHOS 51 11/27/2021 1023   ALKPHOS 60 01/09/2016 1045   BILITOT 0.7 11/27/2021 1023   BILITOT 0.7 09/11/2021 1201   BILITOT 0.51 01/09/2016 1045   GFRNONAA >60 11/27/2021 1023   GFRNONAA >60 09/11/2021 1201   GFRAA >60 08/30/2020 1402    Lab Results  Component Value Date   TOTALPROTELP 6.1 11/27/2021   ALBUMINELP 56.1 07/12/2012   A1GS 3.5 07/12/2012   A2GS 9.8 07/12/2012   BETS 5.1 07/12/2012   BETA2SER 3.6 07/12/2012   GAMS 21.9 (H) 07/12/2012   MSPIKE 0.87 07/12/2012   SPEI * 07/12/2012     Lab Results  Component Value Date   KPAFRELGTCHN 36.2 (H) 11/27/2021   LAMBDASER 20.9 11/27/2021   KAPLAMBRATIO 1.73 (H) 11/27/2021    Lab Results  Component Value Date   WBC 3.4 (L) 11/27/2021   NEUTROABS 1.5 (L) 11/27/2021   HGB 9.8 (L) 11/27/2021   HCT 31.8 (L) 11/27/2021   MCV 81.1 11/27/2021   PLT 137 (L) 11/27/2021      Chemistry      Component Value Date/Time   NA 138 11/27/2021 1023   NA 140 01/09/2016 1045   K 3.6 11/27/2021 1023   K 3.8 01/09/2016 1045   CL 105 11/27/2021 1023   CO2 27 11/27/2021  1023   CO2 27 01/09/2016 1045   BUN 23 11/27/2021 1023   BUN 14.7 01/09/2016 1045   CREATININE 1.10 11/27/2021 1023   CREATININE 0.97 09/11/2021 1201   CREATININE 1.0 01/09/2016 1045      Component Value Date/Time   CALCIUM 8.7 (L) 11/27/2021 1023   CALCIUM 9.2 01/09/2016 1045   ALKPHOS 51 11/27/2021 1023   ALKPHOS 60 01/09/2016 1045   AST 13 (L) 11/27/2021 1023   AST 16 09/11/2021 1201   AST 18 01/09/2016 1045   ALT 10 11/27/2021 1023   ALT 14 09/11/2021 1201   ALT 15 01/09/2016 1045   BILITOT 0.7 11/27/2021 1023   BILITOT 0.7 09/11/2021 1201   BILITOT 0.51 01/09/2016 1045       No results found for: LABCA2  No components found for: IOEVOJ500  No results for input(s): INR in the last 168 hours.  No results found for: LABCA2  No results found for: XFG182  No results found for: XHB716  No results found for: RCV893  No results found for: CA2729  No components found for: HGQUANT  No results found for: CEA1 / No results found for: CEA1   No results found for: AFPTUMOR  No results found for: CHROMOGRNA  No results found for: HGBA, HGBA2QUANT, HGBFQUANT, HGBSQUAN (Hemoglobinopathy evaluation)   Lab Results  Component Value Date   LDH 136 01/09/2016    Lab Results  Component Value Date   IRON 89 04/17/2021   TIBC 245 04/17/2021   IRONPCTSAT 36 04/17/2021   (Iron and TIBC)  Lab Results  Component Value Date   FERRITIN 340 (H) 04/17/2021    Urinalysis    Component Value Date/Time   COLORURINE AMBER (A) 03/01/2020 1052   APPEARANCEUR CLOUDY (A) 03/01/2020 1052   LABSPEC 1.021 03/01/2020 1052   PHURINE 5.0 03/01/2020 [place] 03/01/2020 1052  HGBUR NEGATIVE 03/01/2020 [place] 03/01/2020 1052   KETONESUR NEGATIVE 03/01/2020 1052   PROTEINUR 100 (A) 03/01/2020 1052   UROBILINOGEN 0.2 02/29/2020 1537   NITRITE NEGATIVE 03/01/2020 1052   LEUKOCYTESUR NEGATIVE 03/01/2020 1052    STUDIES: No results  found.   ELIGIBLE FOR AVAILABLE RESEARCH PROTOCOL:   ASSESSMENT: 88 y.o.  [address] [place] man with a history of M-GUS, presenting 02/29/2020 with a high globulin fraction, worsening renal function, hypercalcemia, anemia and multiple lytic bone lesions.    (1) IgG Kappa Multiple Myeloma: labs diagnostic on 03/01/2020 with Mspike of 3.8, Kappa free light chain 690.6, and ratio 117.05.  (a) CT A/P on 03/01/2020 shows "innumerable" lytic lesions involving all visualized bones  (b) bone marrow biopsy 03/11/2020 shows 20% plasmacytosis by CD138, 12% by manual differential; molecular studies not requested  (c) Bortezomib and Dexamethasone given weekly 3 weeks on and 1 week off beginning 03/11/2020, with good response, last dose 07/24/2020  (d) transitioned to lenalidomide 10 mg daily 21/7 starting 07/31/2020    (2) lytic bone lesions/ hypercalcemia:  (a) Pamidronate administered on 03/01/2020  (b) denosumab/Xgeva started 04/17/2020-- repeat every 12 weeks   (a) to take TUMS 3 tabs on day of Xgeva dose   PLAN:  Mr. Blasius is now a year and a half out from diagnosis of multiple myeloma.   His disease is very well controlled on maintenance lenalidomide.  Last labs, no monoclonal spike observed.   He will continue Revlimid as instructed, likely duration of Revlimid as 2 years.   We can reevaluate at that point if he has to continue Revlimid for longer.  He understands that there is a small risk of blood clots with Revlimid and he needs to be compliant with aspirin.   He can continue on denosumab as long as calcium greater than 9 Mg per deciliter, next dose due at the end of March.   We have also discussed that Revlimid can increase risk of secondary malignancies. Leukopenia, neutropenia likely secondary to treatment, discussed neutropenic precautions. Thrombocytopenia again likely secondary to Revlimid, no bleeding complications noted.  He knows to call for any other issue that may develop before the next  visit  Total encounter time 30 minutes.*    *Total Encounter Time as defined by the Centers for Medicare and Medicaid Services includes, in addition to the face-to-face time of a patient visit (documented in the note above) non-face-to-face time: obtaining and reviewing outside history, ordering and reviewing medications, tests or procedures, care coordination (communications with other health care professionals or caregivers) and documentation in the medical record.

## 2022-01-15 ENCOUNTER — Inpatient Hospital Stay: Payer: Medicare Other | Attending: Oncology

## 2022-01-15 ENCOUNTER — Other Ambulatory Visit: Payer: Self-pay

## 2022-01-15 ENCOUNTER — Telehealth: Payer: Self-pay | Admitting: Hematology and Oncology

## 2022-01-15 ENCOUNTER — Encounter: Payer: Self-pay | Admitting: Hematology and Oncology

## 2022-01-15 ENCOUNTER — Inpatient Hospital Stay (HOSPITAL_BASED_OUTPATIENT_CLINIC_OR_DEPARTMENT_OTHER): Payer: Medicare Other | Admitting: Hematology and Oncology

## 2022-01-15 VITALS — BP 123/46 | HR 91 | Temp 97.7°F | Resp 18 | Ht 71.0 in | Wt 181.1 lb

## 2022-01-15 DIAGNOSIS — Z7982 Long term (current) use of aspirin: Secondary | ICD-10-CM | POA: Insufficient documentation

## 2022-01-15 DIAGNOSIS — D649 Anemia, unspecified: Secondary | ICD-10-CM | POA: Insufficient documentation

## 2022-01-15 DIAGNOSIS — Z79899 Other long term (current) drug therapy: Secondary | ICD-10-CM | POA: Diagnosis not present

## 2022-01-15 DIAGNOSIS — C9000 Multiple myeloma not having achieved remission: Secondary | ICD-10-CM

## 2022-01-15 DIAGNOSIS — Z85820 Personal history of malignant melanoma of skin: Secondary | ICD-10-CM | POA: Diagnosis not present

## 2022-01-15 DIAGNOSIS — D709 Neutropenia, unspecified: Secondary | ICD-10-CM | POA: Diagnosis not present

## 2022-01-15 DIAGNOSIS — N179 Acute kidney failure, unspecified: Secondary | ICD-10-CM

## 2022-01-15 DIAGNOSIS — Z7189 Other specified counseling: Secondary | ICD-10-CM

## 2022-01-15 DIAGNOSIS — Z7961 Long term (current) use of immunomodulator: Secondary | ICD-10-CM | POA: Insufficient documentation

## 2022-01-15 DIAGNOSIS — D696 Thrombocytopenia, unspecified: Secondary | ICD-10-CM

## 2022-01-15 LAB — CBC WITH DIFFERENTIAL/PLATELET
Abs Immature Granulocytes: 0.01 10*3/uL (ref 0.00–0.07)
Basophils Absolute: 0 10*3/uL (ref 0.0–0.1)
Basophils Relative: 1 %
Eosinophils Absolute: 0.3 10*3/uL (ref 0.0–0.5)
Eosinophils Relative: 13 %
HCT: 30.4 % — ABNORMAL LOW (ref 39.0–52.0)
Hemoglobin: 9.4 g/dL — ABNORMAL LOW (ref 13.0–17.0)
Immature Granulocytes: 0 %
Lymphocytes Relative: 39 %
Lymphs Abs: 0.9 10*3/uL (ref 0.7–4.0)
MCH: 25.6 pg — ABNORMAL LOW (ref 26.0–34.0)
MCHC: 30.9 g/dL (ref 30.0–36.0)
MCV: 82.8 fL (ref 80.0–100.0)
Monocytes Absolute: 0.3 10*3/uL (ref 0.1–1.0)
Monocytes Relative: 13 %
Neutro Abs: 0.8 10*3/uL — ABNORMAL LOW (ref 1.7–7.7)
Neutrophils Relative %: 34 %
Platelets: 82 10*3/uL — ABNORMAL LOW (ref 150–400)
RBC: 3.67 MIL/uL — ABNORMAL LOW (ref 4.22–5.81)
RDW: 14.6 % (ref 11.5–15.5)
WBC: 2.4 10*3/uL — ABNORMAL LOW (ref 4.0–10.5)
nRBC: 0 % (ref 0.0–0.2)

## 2022-01-15 LAB — COMPREHENSIVE METABOLIC PANEL
ALT: 10 U/L (ref 0–44)
AST: 12 U/L — ABNORMAL LOW (ref 15–41)
Albumin: 3.6 g/dL (ref 3.5–5.0)
Alkaline Phosphatase: 42 U/L (ref 38–126)
Anion gap: 5 (ref 5–15)
BUN: 20 mg/dL (ref 8–23)
CO2: 28 mmol/L (ref 22–32)
Calcium: 8.5 mg/dL — ABNORMAL LOW (ref 8.9–10.3)
Chloride: 107 mmol/L (ref 98–111)
Creatinine, Ser: 1.21 mg/dL (ref 0.61–1.24)
GFR, Estimated: 57 mL/min — ABNORMAL LOW (ref >60)
Glucose, Bld: 94 mg/dL (ref 70–99)
Potassium: 3.9 mmol/L (ref 3.5–5.1)
Sodium: 140 mmol/L (ref 135–145)
Total Bilirubin: 0.6 mg/dL (ref 0.3–1.2)
Total Protein: 6.4 g/dL — ABNORMAL LOW (ref 6.5–8.1)

## 2022-01-15 NOTE — Telephone Encounter (Signed)
Scheduled appointment per 2/16 los. Left message. Patient will be mailed an updated calendar. °

## 2022-01-16 ENCOUNTER — Other Ambulatory Visit: Payer: Self-pay | Admitting: *Deleted

## 2022-01-16 DIAGNOSIS — C9000 Multiple myeloma not having achieved remission: Secondary | ICD-10-CM

## 2022-01-16 LAB — KAPPA/LAMBDA LIGHT CHAINS
Kappa free light chain: 58.6 mg/L — ABNORMAL HIGH (ref 3.3–19.4)
Kappa, lambda light chain ratio: 2.06 — ABNORMAL HIGH (ref 0.26–1.65)
Lambda free light chains: 28.5 mg/L — ABNORMAL HIGH (ref 5.7–26.3)

## 2022-01-16 MED ORDER — LENALIDOMIDE 10 MG PO CAPS: ORAL_CAPSULE | ORAL | 6 refills | Status: DC

## 2022-01-19 ENCOUNTER — Other Ambulatory Visit: Payer: Self-pay

## 2022-01-19 ENCOUNTER — Other Ambulatory Visit: Payer: Self-pay | Admitting: *Deleted

## 2022-01-19 DIAGNOSIS — D472 Monoclonal gammopathy: Secondary | ICD-10-CM

## 2022-01-19 LAB — MULTIPLE MYELOMA PANEL, SERUM
Albumin SerPl Elph-Mcnc: 3.6 g/dL (ref 2.9–4.4)
Albumin/Glob SerPl: 1.4 (ref 0.7–1.7)
Alpha 1: 0.2 g/dL (ref 0.0–0.4)
Alpha2 Glob SerPl Elph-Mcnc: 0.5 g/dL (ref 0.4–1.0)
B-Globulin SerPl Elph-Mcnc: 0.7 g/dL (ref 0.7–1.3)
Gamma Glob SerPl Elph-Mcnc: 1.2 g/dL (ref 0.4–1.8)
Globulin, Total: 2.6 g/dL (ref 2.2–3.9)
IgA: 166 mg/dL (ref 61–437)
IgG (Immunoglobin G), Serum: 1297 mg/dL (ref 603–1613)
IgM (Immunoglobulin M), Srm: 32 mg/dL (ref 15–143)
Total Protein ELP: 6.2 g/dL (ref 6.0–8.5)

## 2022-01-19 MED ORDER — FINASTERIDE 5 MG PO TABS: 5.0000 mg | ORAL_TABLET | Freq: Every day | ORAL | 3 refills | Status: DC

## 2022-01-21 ENCOUNTER — Other Ambulatory Visit: Payer: Self-pay | Admitting: *Deleted

## 2022-02-13 ENCOUNTER — Other Ambulatory Visit: Payer: Self-pay | Admitting: *Deleted

## 2022-02-13 DIAGNOSIS — C9000 Multiple myeloma not having achieved remission: Secondary | ICD-10-CM

## 2022-02-13 MED ORDER — LENALIDOMIDE 10 MG PO CAPS: ORAL_CAPSULE | ORAL | 6 refills | Status: DC

## 2022-02-23 ENCOUNTER — Other Ambulatory Visit: Payer: Self-pay | Admitting: Adult Health

## 2022-02-26 ENCOUNTER — Inpatient Hospital Stay (HOSPITAL_BASED_OUTPATIENT_CLINIC_OR_DEPARTMENT_OTHER): Payer: Medicare Other | Admitting: Hematology and Oncology

## 2022-02-26 ENCOUNTER — Encounter: Payer: Self-pay | Admitting: Hematology and Oncology

## 2022-02-26 ENCOUNTER — Other Ambulatory Visit: Payer: Self-pay

## 2022-02-26 ENCOUNTER — Inpatient Hospital Stay: Payer: Medicare Other | Attending: Oncology

## 2022-02-26 ENCOUNTER — Inpatient Hospital Stay: Payer: Medicare Other

## 2022-02-26 VITALS — BP 142/50 | HR 88 | Temp 97.8°F | Resp 16 | Ht 71.0 in | Wt 177.7 lb

## 2022-02-26 DIAGNOSIS — C9000 Multiple myeloma not having achieved remission: Secondary | ICD-10-CM

## 2022-02-26 DIAGNOSIS — N179 Acute kidney failure, unspecified: Secondary | ICD-10-CM

## 2022-02-26 DIAGNOSIS — Z7982 Long term (current) use of aspirin: Secondary | ICD-10-CM | POA: Diagnosis not present

## 2022-02-26 DIAGNOSIS — Z85820 Personal history of malignant melanoma of skin: Secondary | ICD-10-CM | POA: Diagnosis not present

## 2022-02-26 DIAGNOSIS — Z7189 Other specified counseling: Secondary | ICD-10-CM

## 2022-02-26 DIAGNOSIS — Z79899 Other long term (current) drug therapy: Secondary | ICD-10-CM | POA: Diagnosis not present

## 2022-02-26 DIAGNOSIS — Z7961 Long term (current) use of immunomodulator: Secondary | ICD-10-CM | POA: Diagnosis not present

## 2022-02-26 DIAGNOSIS — D649 Anemia, unspecified: Secondary | ICD-10-CM | POA: Insufficient documentation

## 2022-02-26 LAB — CBC WITH DIFFERENTIAL/PLATELET
Abs Immature Granulocytes: 0.01 10*3/uL (ref 0.00–0.07)
Basophils Absolute: 0 10*3/uL (ref 0.0–0.1)
Basophils Relative: 1 %
Eosinophils Absolute: 0.2 10*3/uL (ref 0.0–0.5)
Eosinophils Relative: 7 %
HCT: 31.8 % — ABNORMAL LOW (ref 39.0–52.0)
Hemoglobin: 10 g/dL — ABNORMAL LOW (ref 13.0–17.0)
Immature Granulocytes: 0 %
Lymphocytes Relative: 40 %
Lymphs Abs: 1.2 10*3/uL (ref 0.7–4.0)
MCH: 25.7 pg — ABNORMAL LOW (ref 26.0–34.0)
MCHC: 31.4 g/dL (ref 30.0–36.0)
MCV: 81.7 fL (ref 80.0–100.0)
Monocytes Absolute: 0.3 10*3/uL (ref 0.1–1.0)
Monocytes Relative: 11 %
Neutro Abs: 1.2 10*3/uL — ABNORMAL LOW (ref 1.7–7.7)
Neutrophils Relative %: 41 %
Platelets: 113 10*3/uL — ABNORMAL LOW (ref 150–400)
RBC: 3.89 MIL/uL — ABNORMAL LOW (ref 4.22–5.81)
RDW: 14.2 % (ref 11.5–15.5)
WBC: 3 10*3/uL — ABNORMAL LOW (ref 4.0–10.5)
nRBC: 0 % (ref 0.0–0.2)

## 2022-02-26 LAB — COMPREHENSIVE METABOLIC PANEL
ALT: 9 U/L (ref 0–44)
AST: 13 U/L — ABNORMAL LOW (ref 15–41)
Albumin: 3.8 g/dL (ref 3.5–5.0)
Alkaline Phosphatase: 46 U/L (ref 38–126)
Anion gap: 7 (ref 5–15)
BUN: 23 mg/dL (ref 8–23)
CO2: 28 mmol/L (ref 22–32)
Calcium: 9.3 mg/dL (ref 8.9–10.3)
Chloride: 104 mmol/L (ref 98–111)
Creatinine, Ser: 1.27 mg/dL — ABNORMAL HIGH (ref 0.61–1.24)
GFR, Estimated: 54 mL/min — ABNORMAL LOW (ref >60)
Glucose, Bld: 111 mg/dL — ABNORMAL HIGH (ref 70–99)
Potassium: 3.8 mmol/L (ref 3.5–5.1)
Sodium: 139 mmol/L (ref 135–145)
Total Bilirubin: 0.7 mg/dL (ref 0.3–1.2)
Total Protein: 6.8 g/dL (ref 6.5–8.1)

## 2022-02-26 MED ORDER — DENOSUMAB 120 MG/1.7ML ~~LOC~~ SOLN
120.0000 mg | Freq: Once | SUBCUTANEOUS | Status: AC
  Administered 2022-02-26: 120 mg via SUBCUTANEOUS

## 2022-02-26 NOTE — Progress Notes (Signed)
?[place]  ?Telephone:(336) [phone] [phone]) [phone]  ? ? ? ?IDJamarr [name] DOB: September 15, 1929  MR#: [id number]  GLO#:[id number] ? ?Patient Care Team: ?[name], MD as PCP - General (Internal Medicine) ?Magrinat, Virgie Dad, MD (Inactive) as Consulting Physician (Oncology) ?Long-Stokes, Ivin Booty ?Benay Pike, MD ?OTHER MD: ? ?CHIEF COMPLAINT: multiple myeloma ? ?CURRENT TREATMENT: denosumab Xgeva every 12 weeks; lenalidomide 10 mg 21/7 ? ? ?INTERVAL HISTORY: ?Mehran returns today for follow up and treatment of his multiple myeloma.  ?He continues on lenalidomide, 5m daily 21 days on and 7 day off.  ?He is a robust 88year old, very compliant with Revlimid as instructed has a few more days before he goes on his week off, he takes his aspirin as instructed.  He denies any new symptoms except for some back pain which she relates to increased activity since he takes care of his wife who has dementia.  He denies any new bone pains.  No change in breathing or bowel habits or urinary habits. ?We are following his M spike and kappa lambda ratio.  The M spike remains not observed and the immunofixation pattern is unremarkable with no evidence of a monoclonal protein. ?Rest of the pertinent 10 point ROS reviewed and negative. ? ?Lab Results  ?Component Value Date  ? KAPLAMBRATIO 2.06 (H) 01/15/2022  ? KAPLAMBRATIO 1.73 (H) 11/27/2021  ? KAPLAMBRATIO 2.43 (H) 10/15/2021  ? KAPLAMBRATIO 2.16 (H) 09/08/2021  ? KAPLAMBRATIO 1.53 06/09/2021  ? ?He receives denosumab/Xgeva every 12 weeks, due end of March 2023. ? ?REVIEW OF SYSTEMS: ?Rest of the pertinent 10 point ROS reviewed and negative ? ? COVID 19 VACCINATION STATUS: [place] with booster [place] 2021.  He received evusheld on 03/20/2021 and is due a second dose  ? ?HISTORY OF CURRENT ILLNESS: ? ?From the original intake note: ? ?Mr. Ton is an 88y/o GGuyanaman formerly followed by my partner Dr MJulien Nordmannfor M-GUS. We have an  M-Spike of 0.87 documented 07/12/2012. [name] kappa/lambda light chains and total IgG were follower yearly until 01/2016, when they were 2.36 and 1517 respectively (unremarkable). He was lost to follow-up after that point.  ?[02/29/2020] the patient presented to Urgent Care with poorly controlled pain. This was felt to be mussculoskeletal and he was started on a medrol dose-pak and robaxin. However his pain worsened [facility] but labs showed a creatinine of 1.84 (baseline 1.0), calcium 12.7 with albumin 2.8 and total protein 10.4 (globulin fraction 7.8).  ?CT of the abdomen obtained for evaluation of his abdominal and back pain showed no hydronephrosis but multiple lytic lesons. ? ? ?The patient's subsequent history is as detailed below. ? ?PAST MEDICAL HISTORY: ?Past Medical History:  ?Diagnosis Date  ? Enlarged prostate   ? Hypertension   ? [place]  ? ? ?PAST SURGICAL HISTORY: ?Past Surgical History:  ?Procedure Laterality Date  ? EYE SURGERY    ? PROSTATE SURGERY    ? ? ?FAMILY HISTORY ?Family History  ?Problem Relation Age of Onset  ? Hypertension Mother   ? ? ?SOCIAL HISTORY:  ?Retired, used to work for the [employer] Lives with [place] 4 children, [place] 5 grandchildren and 5 great-grandchildren. Attends a [employer] ?  ? ADVANCED DIRECTIVES: at the initial visit the patient confirmed his wife is his HCPOA (despite her memory issues). ? ? ?  HEALTH MAINTENANCE: ?Social History  ? ?Tobacco Use  ? Smoking status: Never  ? Smokeless tobacco: Never  ?Vaping Use  ? Vaping Use: Never used  ?Substance Use Topics  ? Alcohol use: Never  ? Drug use: Never  ? ? ? Colonoscopy: 6-8 years ago ?  ?  ?No Known Allergies ? ?Current Outpatient Medications  ?Medication Sig Dispense Refill  ? acyclovir (ZOVIRAX) 400 MG tablet Take 1 tablet (400 mg total) by  mouth 2 (two) times daily. 180 tablet 3  ? amLODipine (NORVASC) 5 MG tablet Take 5 mg by mouth daily.    ? aspirin EC 81 MG tablet Take 81 mg by mouth daily. Swallow whole.     ? calcium carbonate (TUMS - DOSED IN MG ELEMENTAL CALCIUM) 500 MG chewable tablet Chew 3 tablets by mouth daily.    ? doxazosin (CARDURA) 4 MG tablet Take 4 mg by mouth at bedtime.    ? famotidine (PEPCID) 20 MG tablet Take 1 tablet by mouth once daily 90 tablet 0  ? finasteride (PROSCAR) 5 MG tablet Take 1 tablet (5 mg total) by mouth daily. 30 tablet 3  ? gabapentin (NEURONTIN) 100 MG capsule Take 2 capsules (200 mg total) by mouth at bedtime. 90 capsule 4  ? indapamide (LOZOL) 2.5 MG tablet Take 2.5 mg by mouth daily.     ? lenalidomide (REVLIMID) 10 MG capsule Take 1 tablet daily for 21 days then 7 days off every 28 days Adult male [employer] # [id number] obtained 02/13/2022 21 capsule 6  ? ondansetron (ZOFRAN) 8 MG tablet TAKE 1 TABLET BY MOUTH THREE TIMES DAILY AS NEEDED FOR NAUSEA OR VOMITING 20 tablet 0  ? polyethylene glycol powder (MIRALAX) 17 GM/SCOOP powder Take 255 g by mouth daily. 255 g 0  ? potassium chloride (KLOR-CON) 10 MEQ tablet Take 2 tablets by mouth once daily 180 tablet 0  ? pravastatin (PRAVACHOL) 40 MG tablet Take 40 mg by mouth daily.    ? prochlorperazine (COMPAZINE) 10 MG tablet TAKE 1 TABLET BY MOUTH EVERY 6 HOURS AS NEEDED FOR NAUSEA AND VOMITING 30 tablet 0  ? tamsulosin (FLOMAX) 0.4 MG CAPS capsule TAKE 1 CAPSULE BY MOUTH ONCE DAILY AFTER SUPPER 90 capsule 0  ? traMADol (ULTRAM) 50 MG tablet TAKE 1 TABLET BY MOUTH EVERY 8 HOURS AS NEEDED. 30 tablet 0  ? ?No current facility-administered medications for this visit.  ? ? ?OBJECTIVE: African-American man in no acute distress ?Vitals:  ? 02/26/22 1040  ?BP: (!) 142/50  ?Pulse: 88  ?Resp: 16  ?Temp: 97.8 ?F (36.6 ?C)  ?SpO2: 100%  ? ? ? ?   Body mass index is 24.78 kg/m?.   ?Wt Readings from Last 3 Encounters:  ?02/26/22 177 lb 11.2 oz (80.6 kg)  ?01/15/22 181 lb  1.6 oz (82.1 kg)  ?12/04/21 174 lb 3.2 oz (79 kg)  ?ECOG FS:1 - Symptomatic but completely ambulatory ? ?Physical Exam ?Constitutional:   ?   Appearance: Normal appearance.  ?HENT:  ?   Head: Normocephalic and atraumatic.  ?Cardiovascular:  ?   Rate and Rhythm: Normal rate and regular rhythm.  ?Abdominal:  ?   General: Abdomen is flat.  ?   Palpations: Abdomen is soft.  ?Musculoskeletal:     ?   General: No swelling or tenderness.  ?   Cervical back: Normal range of motion and neck supple.  ?Neurological:  ?   General: No focal deficit present.  ?   Mental Status: He is  alert.  ?Psychiatric:     ?   Mood and Affect: Mood normal.  ? ? ? ?LAB RESULTS: ? ?CMP  ?   ?Component Value Date/Time  ? NA 139 02/26/2022 1008  ? NA 140 01/09/2016 1045  ? K 3.8 02/26/2022 1008  ? K 3.8 01/09/2016 1045  ? CL 104 02/26/2022 1008  ? CO2 28 02/26/2022 1008  ? CO2 27 01/09/2016 1045  ? GLUCOSE 111 (H) 02/26/2022 1008  ? GLUCOSE 89 01/09/2016 1045  ? BUN 23 02/26/2022 1008  ? BUN 14.7 01/09/2016 1045  ? CREATININE 1.27 (H) 02/26/2022 1008  ? CREATININE 0.97 09/11/2021 1201  ? CREATININE 1.0 01/09/2016 1045  ? CALCIUM 9.3 02/26/2022 1008  ? CALCIUM 9.2 01/09/2016 1045  ? PROT 6.8 02/26/2022 1008  ? PROT 7.3 01/09/2016 1045  ? ALBUMIN 3.8 02/26/2022 1008  ? ALBUMIN 3.7 01/09/2016 1045  ? AST 13 (L) 02/26/2022 1008  ? AST 16 09/11/2021 1201  ? AST 18 01/09/2016 1045  ? ALT 9 02/26/2022 1008  ? ALT 14 09/11/2021 1201  ? ALT 15 01/09/2016 1045  ? ALKPHOS 46 02/26/2022 1008  ? ALKPHOS 60 01/09/2016 1045  ? BILITOT 0.7 02/26/2022 1008  ? BILITOT 0.7 09/11/2021 1201  ? BILITOT 0.51 01/09/2016 1045  ? GFRNONAA 54 (L) 02/26/2022 1008  ? GFRNONAA >60 09/11/2021 1201  ? GFRAA >60 08/30/2020 1402  ? ? ?Lab Results  ?Component Value Date  ? TOTALPROTELP 6.2 01/15/2022  ? ALBUMINELP 56.1 07/12/2012  ? A1GS 3.5 07/12/2012  ? A2GS 9.8 07/12/2012  ? BETS 5.1 07/12/2012  ? BETA2SER 3.6 07/12/2012  ? GAMS 21.9 (H) 07/12/2012  ? MSPIKE 0.87 07/12/2012  ?  SPEI * 07/12/2012  ? ? ? ?Lab Results  ?Component Value Date  ? KPAFRELGTCHN 58.6 (H) 01/15/2022  ? LAMBDASER 28.5 (H) 01/15/2022  ? KAPLAMBRATIO 2.06 (H) 01/15/2022  ? ? ?Lab Results  ?Component Value Date  ? WBC

## 2022-02-27 LAB — KAPPA/LAMBDA LIGHT CHAINS
Kappa free light chain: 59.6 mg/L — ABNORMAL HIGH (ref 3.3–19.4)
Kappa, lambda light chain ratio: 2.79 — ABNORMAL HIGH (ref 0.26–1.65)
Lambda free light chains: 21.4 mg/L (ref 5.7–26.3)

## 2022-03-02 LAB — MULTIPLE MYELOMA PANEL, SERUM
Albumin SerPl Elph-Mcnc: 3.6 g/dL (ref 2.9–4.4)
Albumin/Glob SerPl: 1.4 (ref 0.7–1.7)
Alpha 1: 0.2 g/dL (ref 0.0–0.4)
Alpha2 Glob SerPl Elph-Mcnc: 0.6 g/dL (ref 0.4–1.0)
B-Globulin SerPl Elph-Mcnc: 0.7 g/dL (ref 0.7–1.3)
Gamma Glob SerPl Elph-Mcnc: 1.3 g/dL (ref 0.4–1.8)
Globulin, Total: 2.7 g/dL (ref 2.2–3.9)
IgA: 198 mg/dL (ref 61–437)
IgG (Immunoglobin G), Serum: 1290 mg/dL (ref 603–1613)
IgM (Immunoglobulin M), Srm: 30 mg/dL (ref 15–143)
Total Protein ELP: 6.3 g/dL (ref 6.0–8.5)

## 2022-03-06 ENCOUNTER — Inpatient Hospital Stay: Payer: Medicare Other

## 2022-03-06 ENCOUNTER — Inpatient Hospital Stay: Payer: Medicare Other | Admitting: Hematology and Oncology

## 2022-03-12 DIAGNOSIS — G609 Hereditary and idiopathic neuropathy, unspecified: Secondary | ICD-10-CM | POA: Diagnosis not present

## 2022-03-12 DIAGNOSIS — R5383 Other fatigue: Secondary | ICD-10-CM | POA: Diagnosis not present

## 2022-03-12 DIAGNOSIS — R739 Hyperglycemia, unspecified: Secondary | ICD-10-CM | POA: Diagnosis not present

## 2022-03-12 DIAGNOSIS — I10 Essential (primary) hypertension: Secondary | ICD-10-CM | POA: Diagnosis not present

## 2022-03-12 DIAGNOSIS — D472 Monoclonal gammopathy: Secondary | ICD-10-CM | POA: Diagnosis not present

## 2022-03-12 DIAGNOSIS — M1A09X0 Idiopathic chronic gout, multiple sites, without tophus (tophi): Secondary | ICD-10-CM | POA: Diagnosis not present

## 2022-03-12 DIAGNOSIS — E785 Hyperlipidemia, unspecified: Secondary | ICD-10-CM | POA: Diagnosis not present

## 2022-03-13 ENCOUNTER — Other Ambulatory Visit: Payer: Self-pay | Admitting: *Deleted

## 2022-03-13 DIAGNOSIS — C9000 Multiple myeloma not having achieved remission: Secondary | ICD-10-CM

## 2022-03-13 MED ORDER — POTASSIUM CHLORIDE ER 10 MEQ PO TBCR: 20.0000 meq | EXTENDED_RELEASE_TABLET | Freq: Every day | ORAL | 1 refills | Status: DC

## 2022-03-17 ENCOUNTER — Other Ambulatory Visit: Payer: Self-pay | Admitting: *Deleted

## 2022-03-17 DIAGNOSIS — C9000 Multiple myeloma not having achieved remission: Secondary | ICD-10-CM

## 2022-03-17 MED ORDER — LENALIDOMIDE 10 MG PO CAPS: ORAL_CAPSULE | ORAL | 6 refills | Status: DC

## 2022-03-17 NOTE — Telephone Encounter (Signed)
Received faxed refill requerst from [facility] by [employer] for refill of Revlimid 10 mg 21 days on/ 7 days off ?Refill escribed per Dr. Rob Hickman OV note 02/26/22 ?[employer] B[facility] # ??[id number] ?

## 2022-03-19 ENCOUNTER — Telehealth: Payer: Self-pay

## 2022-03-19 ENCOUNTER — Telehealth: Payer: Self-pay | Admitting: Hematology and Oncology

## 2022-03-19 DIAGNOSIS — I10 Essential (primary) hypertension: Secondary | ICD-10-CM | POA: Diagnosis not present

## 2022-03-19 DIAGNOSIS — E785 Hyperlipidemia, unspecified: Secondary | ICD-10-CM | POA: Diagnosis not present

## 2022-03-19 DIAGNOSIS — C9000 Multiple myeloma not having achieved remission: Secondary | ICD-10-CM | POA: Diagnosis not present

## 2022-03-19 DIAGNOSIS — D472 Monoclonal gammopathy: Secondary | ICD-10-CM | POA: Diagnosis not present

## 2022-03-19 DIAGNOSIS — D509 Iron deficiency anemia, unspecified: Secondary | ICD-10-CM | POA: Diagnosis not present

## 2022-03-19 DIAGNOSIS — D61818 Other pancytopenia: Secondary | ICD-10-CM | POA: Diagnosis not present

## 2022-03-19 DIAGNOSIS — Z0000 Encounter for general adult medical examination without abnormal findings: Secondary | ICD-10-CM | POA: Diagnosis not present

## 2022-03-19 NOTE — Telephone Encounter (Signed)
.  Called patient to schedule appointment per 4/20 INBASKET, patient is aware of date and time.   ?

## 2022-03-19 NOTE — Telephone Encounter (Signed)
Received call from Nancy Roma at PCP office stating pt's anemia has worsened and recommends sooner f/u than what he is currently scheduled for in May. Per MD OK to schedule next week. Pt is aware scheduling will be calling him for appt. Per MD, orders entered for CBC, CMP, kappa lambda lightchains, multiple myeloma panel, ferritin, iron & TIBC.  ?

## 2022-03-26 ENCOUNTER — Other Ambulatory Visit: Payer: Self-pay

## 2022-03-26 ENCOUNTER — Inpatient Hospital Stay: Payer: Medicare Other

## 2022-03-26 ENCOUNTER — Inpatient Hospital Stay: Payer: Medicare Other | Attending: Oncology | Admitting: Hematology and Oncology

## 2022-03-26 VITALS — BP 157/57 | HR 62 | Temp 97.9°F | Resp 16 | Ht 71.0 in | Wt 179.7 lb

## 2022-03-26 DIAGNOSIS — Z85820 Personal history of malignant melanoma of skin: Secondary | ICD-10-CM | POA: Insufficient documentation

## 2022-03-26 DIAGNOSIS — D649 Anemia, unspecified: Secondary | ICD-10-CM | POA: Insufficient documentation

## 2022-03-26 DIAGNOSIS — Z7961 Long term (current) use of immunomodulator: Secondary | ICD-10-CM | POA: Insufficient documentation

## 2022-03-26 DIAGNOSIS — C9000 Multiple myeloma not having achieved remission: Secondary | ICD-10-CM | POA: Diagnosis not present

## 2022-03-26 DIAGNOSIS — Z79899 Other long term (current) drug therapy: Secondary | ICD-10-CM | POA: Diagnosis not present

## 2022-03-26 DIAGNOSIS — Z7982 Long term (current) use of aspirin: Secondary | ICD-10-CM | POA: Insufficient documentation

## 2022-03-26 LAB — CBC WITH DIFFERENTIAL (CANCER CENTER ONLY)
Abs Immature Granulocytes: 0 10*3/uL (ref 0.00–0.07)
Basophils Absolute: 0 10*3/uL (ref 0.0–0.1)
Basophils Relative: 1 %
Eosinophils Absolute: 0.2 10*3/uL (ref 0.0–0.5)
Eosinophils Relative: 7 %
HCT: 32.8 % — ABNORMAL LOW (ref 39.0–52.0)
Hemoglobin: 10.4 g/dL — ABNORMAL LOW (ref 13.0–17.0)
Immature Granulocytes: 0 %
Lymphocytes Relative: 19 %
Lymphs Abs: 0.6 10*3/uL — ABNORMAL LOW (ref 0.7–4.0)
MCH: 25.9 pg — ABNORMAL LOW (ref 26.0–34.0)
MCHC: 31.7 g/dL (ref 30.0–36.0)
MCV: 81.6 fL (ref 80.0–100.0)
Monocytes Absolute: 0.3 10*3/uL (ref 0.1–1.0)
Monocytes Relative: 8 %
Neutro Abs: 2.1 10*3/uL (ref 1.7–7.7)
Neutrophils Relative %: 65 %
Platelet Count: 93 10*3/uL — ABNORMAL LOW (ref 150–400)
RBC: 4.02 MIL/uL — ABNORMAL LOW (ref 4.22–5.81)
RDW: 14.8 % (ref 11.5–15.5)
WBC Count: 3.2 10*3/uL — ABNORMAL LOW (ref 4.0–10.5)
nRBC: 0 % (ref 0.0–0.2)

## 2022-03-26 LAB — CMP (CANCER CENTER ONLY)
ALT: 11 U/L (ref 0–44)
AST: 14 U/L — ABNORMAL LOW (ref 15–41)
Albumin: 4 g/dL (ref 3.5–5.0)
Alkaline Phosphatase: 49 U/L (ref 38–126)
Anion gap: 4 — ABNORMAL LOW (ref 5–15)
BUN: 18 mg/dL (ref 8–23)
CO2: 29 mmol/L (ref 22–32)
Calcium: 9.6 mg/dL (ref 8.9–10.3)
Chloride: 103 mmol/L (ref 98–111)
Creatinine: 1.17 mg/dL (ref 0.61–1.24)
GFR, Estimated: 59 mL/min — ABNORMAL LOW (ref >60)
Glucose, Bld: 94 mg/dL (ref 70–99)
Potassium: 3.9 mmol/L (ref 3.5–5.1)
Sodium: 136 mmol/L (ref 135–145)
Total Bilirubin: 0.9 mg/dL (ref 0.3–1.2)
Total Protein: 7.1 g/dL (ref 6.5–8.1)

## 2022-03-26 LAB — IRON AND IRON BINDING CAPACITY (CC-WL,HP ONLY)
Iron: 28 ug/dL — ABNORMAL LOW (ref 45–182)
Saturation Ratios: 9 % — ABNORMAL LOW (ref 17.9–39.5)
TIBC: 304 ug/dL (ref 250–450)
UIBC: 276 ug/dL (ref 117–376)

## 2022-03-26 LAB — FERRITIN: Ferritin: 122 ng/mL (ref 24–336)

## 2022-03-26 NOTE — Progress Notes (Signed)
?[facility]  ?Telephone:([phone] [phone]  ? ? ? ?IDMartell [name] DOB: June 15, 1929  MR#: [id number]  LOV#:[id number] ? ?Patient Care Team: ?[name], MD as PCP - General (Internal Medicine) ?Magrinat, Joe Dad, MD (Inactive) as Consulting Physician (Oncology) ?Long-Stokes, Joe [name] ?Joe Pike, MD ? ?CHIEF COMPLAINT: multiple myeloma ? ?CURRENT TREATMENT: denosumab Xgeva every 12 weeks; lenalidomide 10 mg 21/7 ? ?INTERVAL HISTORY: ?Joe [name] returns today for follow up and treatment of his multiple myeloma.  ?He was recently seen by Dr Ashby Dawes, had his labs which showed a hemoglobin of 8.9 and platelet count of 95,000 hence was sent back to hematology.  He has been taking Revlimid as prescribed.  He denies any changes in his health.  He feels quite robust, active with all his chores. ?Rest of the pertinent 10 point ROS reviewed and negative. ? ?Lab Results  ?Component Value Date  ? KAPLAMBRATIO 2.79 (H) 02/26/2022  ? KAPLAMBRATIO 2.06 (H) 01/15/2022  ? KAPLAMBRATIO 1.73 (H) 11/27/2021  ? KAPLAMBRATIO 2.43 (H) 10/15/2021  ? KAPLAMBRATIO 2.16 (H) 09/08/2021  ? ?He receives denosumab/Xgeva every 12 weeks  ? ? ?REVIEW OF SYSTEMS: ?Rest of the pertinent 10 point ROS reviewed and negative ? ? COVID 19 VACCINATION STATUS: received [place] x2 with booster October 2021.  He received evusheld on 03/20/2021 and is due a second dose  ? ?HISTORY OF CURRENT ILLNESS: ? ?From the original intake note: ? ?Mr. Joe [name] is an 88 y/o [place] man formerly followed by my partner Dr Julien Nordmann for M-GUS. We have an M-Spike of 0.87 documented 07/12/2012. [name] kappa/lambda light chains and total IgG were follower yearly until 01/2016, when they were 2.36 and 1517 respectively (unremarkable). He was lost to follow-up after that point.  ?[02/29/2020] the patient presented to Urgent Care with poorly controlled pain. This was felt to be mussculoskeletal and he was started on a medrol dose-pak and robaxin.  However his pain worsened and he presented to the ED at [facility] where vitals and exam were unremarkable but labs showed a creatinine of 1.84 (baseline 1.0), calcium 12.7 with albumin 2.8 and total protein 10.4 (globulin fraction 7.8).  ?CT of the abdomen obtained for evaluation of his abdominal and back pain showed no hydronephrosis but multiple lytic lesons. ? ? ?The patient's subsequent history is as detailed below. ? ?PAST MEDICAL HISTORY: ?Past Medical History:  ?Diagnosis Date  ? Enlarged prostate   ? Hypertension   ? Melanoma ([place]) 2021  ? ? ?PAST SURGICAL HISTORY: ?Past Surgical History:  ?Procedure Laterality Date  ? EYE SURGERY    ? PROSTATE SURGERY    ? ? ?FAMILY HISTORY ?Family History  ?Problem Relation Age of Onset  ? Hypertension Mother   ? ? ?SOCIAL HISTORY:  ?Retired, used to work for the [employer] Lives with wife [place]. Has 4 children, 2 in [place], one in [place], one in [place]; 5 grandchildren and 5 great-grandchildren. Attends a [employer]. ?  ? ADVANCED DIRECTIVES: at the initial visit the patient confirmed his wife is his HCPOA (despite her memory issues). ? ? ?HEALTH MAINTENANCE: ?Social History  ? ?Tobacco Use  ? Smoking status: Never  ? Smokeless tobacco: Never  ?Vaping Use  ? Vaping Use: Never used  ?Substance Use Topics  ? Alcohol use: Never  ? Drug use: Never  ? ? ? Colonoscopy: 6-8 years ago ?  ?  ?No Known Allergies ? ?Current Outpatient Medications  ?Medication Sig Dispense Refill  ? acyclovir (ZOVIRAX)  400 MG tablet Take 1 tablet (400 mg total) by mouth 2 (two) times daily. 180 tablet 3  ? amLODipine (NORVASC) 5 MG tablet Take 5 mg by mouth daily.    ? aspirin EC 81 MG tablet Take 81 mg by mouth daily. Swallow whole.     ? calcium carbonate (TUMS - DOSED IN MG ELEMENTAL CALCIUM) 500 MG chewable tablet Chew 3 tablets by mouth daily.    ? doxazosin (CARDURA) 4 MG tablet Take 4 mg by mouth at bedtime.    ? famotidine (PEPCID) 20 MG tablet  Take 1 tablet by mouth once daily 90 tablet 0  ? finasteride (PROSCAR) 5 MG tablet Take 1 tablet (5 mg total) by mouth daily. 30 tablet 3  ? gabapentin (NEURONTIN) 100 MG capsule Take 2 capsules (200 mg total) by mouth at bedtime. 90 capsule 4  ? indapamide (LOZOL) 2.5 MG tablet Take 2.5 mg by mouth daily.     ? lenalidomide (REVLIMID) 10 MG capsule Take 1 tablet daily for 21 days then 7 days off every 28 days  Celgene BMS auth [id number] 21 capsule 6  ? ondansetron (ZOFRAN) 8 MG tablet TAKE 1 TABLET BY MOUTH THREE TIMES DAILY AS NEEDED FOR NAUSEA OR VOMITING 20 tablet 0  ? polyethylene glycol powder (MIRALAX) 17 GM/SCOOP powder Take 255 g by mouth daily. 255 g 0  ? potassium chloride (KLOR-CON) 10 MEQ tablet Take 2 tablets (20 mEq total) by mouth daily. 180 tablet 1  ? pravastatin (PRAVACHOL) 40 MG tablet Take 40 mg by mouth daily.    ? prochlorperazine (COMPAZINE) 10 MG tablet TAKE 1 TABLET BY MOUTH EVERY 6 HOURS AS NEEDED FOR NAUSEA AND VOMITING 30 tablet 0  ? tamsulosin (FLOMAX) 0.4 MG CAPS capsule TAKE 1 CAPSULE BY MOUTH ONCE DAILY AFTER SUPPER 90 capsule 0  ? traMADol (ULTRAM) 50 MG tablet TAKE 1 TABLET BY MOUTH EVERY 8 HOURS AS NEEDED. 30 tablet 0  ? ?No current facility-administered medications for this visit.  ? ? ?OBJECTIVE: African-American man in no acute distress ?Vitals:  ? 03/26/22 1516  ?BP: (!) 157/57  ?Pulse: 62  ?Resp: 16  ?Temp: 97.9 ?F (36.6 ?C)  ?SpO2: 98%  ? ? ? ?   Body mass index is 25.06 kg/m?.   ?Wt Readings from Last 3 Encounters:  ?03/26/22 179 lb 11.2 oz (81.5 kg)  ?02/26/22 177 lb 11.2 oz (80.6 kg)  ?01/15/22 181 lb 1.6 oz (82.1 kg)  ?ECOG FS:1 - Symptomatic but completely ambulatory ? ?Physical Exam ?Constitutional:   ?   Appearance: Normal appearance.  ?HENT:  ?   Head: Normocephalic and atraumatic.  ?Cardiovascular:  ?   Rate and Rhythm: Normal rate and regular rhythm.  ?Abdominal:  ?   General: Abdomen is flat.  ?   Palpations: Abdomen is soft.  ?Musculoskeletal:     ?   General: No  swelling or tenderness.  ?   Cervical back: Normal range of motion and neck supple.  ?Neurological:  ?   General: No focal deficit present.  ?   Mental Status: He is alert.  ?Psychiatric:     ?   Mood and Affect: Mood normal.  ? ? ? ?LAB RESULTS: ? ?CMP  ?   ?Component Value Date/Time  ? NA 136 03/26/2022 1439  ? NA 140 01/09/2016 1045  ? K 3.9 03/26/2022 1439  ? K 3.8 01/09/2016 1045  ? CL 103 03/26/2022 1439  ? CO2 29 03/26/2022 1439  ? CO2 27  01/09/2016 1045  ? GLUCOSE 94 03/26/2022 1439  ? GLUCOSE 89 01/09/2016 1045  ? BUN 18 03/26/2022 1439  ? BUN 14.7 01/09/2016 1045  ? CREATININE 1.17 03/26/2022 1439  ? CREATININE 1.0 01/09/2016 1045  ? CALCIUM 9.6 03/26/2022 1439  ? CALCIUM 9.2 01/09/2016 1045  ? PROT 7.1 03/26/2022 1439  ? PROT 7.3 01/09/2016 1045  ? ALBUMIN 4.0 03/26/2022 1439  ? ALBUMIN 3.7 01/09/2016 1045  ? AST 14 (L) 03/26/2022 1439  ? AST 18 01/09/2016 1045  ? ALT 11 03/26/2022 1439  ? ALT 15 01/09/2016 1045  ? ALKPHOS 49 03/26/2022 1439  ? ALKPHOS 60 01/09/2016 1045  ? BILITOT 0.9 03/26/2022 1439  ? BILITOT 0.51 01/09/2016 1045  ? GFRNONAA 59 (L) 03/26/2022 1439  ? GFRAA >60 08/30/2020 1402  ? ? ?Lab Results  ?Component Value Date  ? TOTALPROTELP 6.3 02/26/2022  ? ALBUMINELP 56.1 07/12/2012  ? A1GS 3.5 07/12/2012  ? A2GS 9.8 07/12/2012  ? BETS 5.1 07/12/2012  ? BETA2SER 3.6 07/12/2012  ? GAMS 21.9 (H) 07/12/2012  ? MSPIKE 0.87 07/12/2012  ? SPEI * 07/12/2012  ? ? ? ?Lab Results  ?Component Value Date  ? KPAFRELGTCHN 59.6 (H) 02/26/2022  ? LAMBDASER 21.4 02/26/2022  ? KAPLAMBRATIO 2.79 (H) 02/26/2022  ? ? ?Lab Results  ?Component Value Date  ? WBC 3.2 (L) 03/26/2022  ? NEUTROABS 2.1 03/26/2022  ? HGB 10.4 (L) 03/26/2022  ? HCT 32.8 (L) 03/26/2022  ? MCV 81.6 03/26/2022  ? PLT 93 (L) 03/26/2022  ? ? ?  Chemistry   ?   ?Component Value Date/Time  ? NA 136 03/26/2022 1439  ? NA 140 01/09/2016 1045  ? K 3.9 03/26/2022 1439  ? K 3.8 01/09/2016 1045  ? CL 103 03/26/2022 1439  ? CO2 29 03/26/2022 1439  ? CO2  27 01/09/2016 1045  ? BUN 18 03/26/2022 1439  ? BUN 14.7 01/09/2016 1045  ? CREATININE 1.17 03/26/2022 1439  ? CREATININE 1.0 01/09/2016 1045  ?    ?Component Value Date/Time  ? CALCIUM 9.6 04/27/2

## 2022-03-27 LAB — KAPPA/LAMBDA LIGHT CHAINS
Kappa free light chain: 48.2 mg/L — ABNORMAL HIGH (ref 3.3–19.4)
Kappa, lambda light chain ratio: 2.19 — ABNORMAL HIGH (ref 0.26–1.65)
Lambda free light chains: 22 mg/L (ref 5.7–26.3)

## 2022-03-30 LAB — MULTIPLE MYELOMA PANEL, SERUM
Albumin SerPl Elph-Mcnc: 3.5 g/dL (ref 2.9–4.4)
Albumin/Glob SerPl: 1.2 (ref 0.7–1.7)
Alpha 1: 0.3 g/dL (ref 0.0–0.4)
Alpha2 Glob SerPl Elph-Mcnc: 0.7 g/dL (ref 0.4–1.0)
B-Globulin SerPl Elph-Mcnc: 0.8 g/dL (ref 0.7–1.3)
Gamma Glob SerPl Elph-Mcnc: 1.4 g/dL (ref 0.4–1.8)
Globulin, Total: 3.1 g/dL (ref 2.2–3.9)
IgA: 194 mg/dL (ref 61–437)
IgG (Immunoglobin G), Serum: 1417 mg/dL (ref 603–1613)
IgM (Immunoglobulin M), Srm: 28 mg/dL (ref 15–143)
Total Protein ELP: 6.6 g/dL (ref 6.0–8.5)

## 2022-04-09 ENCOUNTER — Other Ambulatory Visit: Payer: Self-pay | Admitting: *Deleted

## 2022-04-09 DIAGNOSIS — C9000 Multiple myeloma not having achieved remission: Secondary | ICD-10-CM

## 2022-04-09 MED ORDER — FAMOTIDINE 20 MG PO TABS: 20.0000 mg | ORAL_TABLET | Freq: Every day | ORAL | 1 refills | Status: DC

## 2022-04-09 MED ORDER — LENALIDOMIDE 10 MG PO CAPS: ORAL_CAPSULE | ORAL | 0 refills | Status: DC

## 2022-04-24 ENCOUNTER — Ambulatory Visit: Payer: Medicare Other | Admitting: Hematology and Oncology

## 2022-04-24 ENCOUNTER — Other Ambulatory Visit: Payer: Medicare Other

## 2022-05-08 ENCOUNTER — Other Ambulatory Visit: Payer: Self-pay | Admitting: *Deleted

## 2022-05-08 DIAGNOSIS — C9000 Multiple myeloma not having achieved remission: Secondary | ICD-10-CM

## 2022-05-08 MED ORDER — LENALIDOMIDE 10 MG PO CAPS: ORAL_CAPSULE | ORAL | 0 refills | Status: DC

## 2022-05-14 ENCOUNTER — Other Ambulatory Visit: Payer: Self-pay | Admitting: Hematology and Oncology

## 2022-05-20 ENCOUNTER — Other Ambulatory Visit: Payer: Self-pay | Admitting: Hematology and Oncology

## 2022-05-20 DIAGNOSIS — D472 Monoclonal gammopathy: Secondary | ICD-10-CM

## 2022-05-21 ENCOUNTER — Encounter: Payer: Self-pay | Admitting: Oncology

## 2022-05-22 ENCOUNTER — Other Ambulatory Visit: Payer: Self-pay | Admitting: *Deleted

## 2022-05-22 DIAGNOSIS — C9000 Multiple myeloma not having achieved remission: Secondary | ICD-10-CM

## 2022-05-25 ENCOUNTER — Inpatient Hospital Stay (HOSPITAL_BASED_OUTPATIENT_CLINIC_OR_DEPARTMENT_OTHER): Payer: Medicare Other | Admitting: Hematology and Oncology

## 2022-05-25 ENCOUNTER — Other Ambulatory Visit: Payer: Self-pay

## 2022-05-25 ENCOUNTER — Inpatient Hospital Stay: Payer: Medicare Other | Attending: Oncology

## 2022-05-25 ENCOUNTER — Inpatient Hospital Stay: Payer: Medicare Other

## 2022-05-25 VITALS — BP 130/47 | HR 81 | Temp 97.7°F | Resp 18 | Ht 71.0 in | Wt 180.1 lb

## 2022-05-25 DIAGNOSIS — C9000 Multiple myeloma not having achieved remission: Secondary | ICD-10-CM

## 2022-05-25 DIAGNOSIS — Z79899 Other long term (current) drug therapy: Secondary | ICD-10-CM | POA: Diagnosis not present

## 2022-05-25 DIAGNOSIS — Z85820 Personal history of malignant melanoma of skin: Secondary | ICD-10-CM | POA: Diagnosis not present

## 2022-05-25 DIAGNOSIS — D649 Anemia, unspecified: Secondary | ICD-10-CM | POA: Insufficient documentation

## 2022-05-25 DIAGNOSIS — Z7961 Long term (current) use of immunomodulator: Secondary | ICD-10-CM | POA: Insufficient documentation

## 2022-05-25 DIAGNOSIS — Z7189 Other specified counseling: Secondary | ICD-10-CM

## 2022-05-25 DIAGNOSIS — Z7982 Long term (current) use of aspirin: Secondary | ICD-10-CM | POA: Insufficient documentation

## 2022-05-25 LAB — CBC WITH DIFFERENTIAL (CANCER CENTER ONLY)
Abs Immature Granulocytes: 0.01 10*3/uL (ref 0.00–0.07)
Basophils Absolute: 0 10*3/uL (ref 0.0–0.1)
Basophils Relative: 1 %
Eosinophils Absolute: 0.1 10*3/uL (ref 0.0–0.5)
Eosinophils Relative: 5 %
HCT: 28.4 % — ABNORMAL LOW (ref 39.0–52.0)
Hemoglobin: 8.9 g/dL — ABNORMAL LOW (ref 13.0–17.0)
Immature Granulocytes: 0 %
Lymphocytes Relative: 38 %
Lymphs Abs: 0.9 10*3/uL (ref 0.7–4.0)
MCH: 25.6 pg — ABNORMAL LOW (ref 26.0–34.0)
MCHC: 31.3 g/dL (ref 30.0–36.0)
MCV: 81.8 fL (ref 80.0–100.0)
Monocytes Absolute: 0.3 10*3/uL (ref 0.1–1.0)
Monocytes Relative: 14 %
Neutro Abs: 1.1 10*3/uL — ABNORMAL LOW (ref 1.7–7.7)
Neutrophils Relative %: 42 %
Platelet Count: 120 10*3/uL — ABNORMAL LOW (ref 150–400)
RBC: 3.47 MIL/uL — ABNORMAL LOW (ref 4.22–5.81)
RDW: 16 % — ABNORMAL HIGH (ref 11.5–15.5)
WBC Count: 2.5 10*3/uL — ABNORMAL LOW (ref 4.0–10.5)
nRBC: 0 % (ref 0.0–0.2)

## 2022-05-25 LAB — CMP (CANCER CENTER ONLY)
ALT: 14 U/L (ref 0–44)
AST: 15 U/L (ref 15–41)
Albumin: 3.6 g/dL (ref 3.5–5.0)
Alkaline Phosphatase: 41 U/L (ref 38–126)
Anion gap: 5 (ref 5–15)
BUN: 19 mg/dL (ref 8–23)
CO2: 28 mmol/L (ref 22–32)
Calcium: 9.3 mg/dL (ref 8.9–10.3)
Chloride: 105 mmol/L (ref 98–111)
Creatinine: 1.24 mg/dL (ref 0.61–1.24)
GFR, Estimated: 55 mL/min — ABNORMAL LOW (ref >60)
Glucose, Bld: 93 mg/dL (ref 70–99)
Potassium: 3.7 mmol/L (ref 3.5–5.1)
Sodium: 138 mmol/L (ref 135–145)
Total Bilirubin: 0.6 mg/dL (ref 0.3–1.2)
Total Protein: 6.4 g/dL — ABNORMAL LOW (ref 6.5–8.1)

## 2022-05-25 LAB — FERRITIN: Ferritin: 142 ng/mL (ref 24–336)

## 2022-05-25 MED ORDER — DENOSUMAB 120 MG/1.7ML ~~LOC~~ SOLN
120.0000 mg | Freq: Once | SUBCUTANEOUS | Status: AC
  Administered 2022-05-25: 120 mg via SUBCUTANEOUS
  Filled 2022-05-25: qty 1.7

## 2022-05-26 ENCOUNTER — Encounter: Payer: Self-pay | Admitting: Hematology and Oncology

## 2022-05-26 ENCOUNTER — Encounter: Payer: Self-pay | Admitting: Oncology

## 2022-05-26 LAB — KAPPA/LAMBDA LIGHT CHAINS
Kappa free light chain: 52.3 mg/L — ABNORMAL HIGH (ref 3.3–19.4)
Kappa, lambda light chain ratio: 2.41 — ABNORMAL HIGH (ref 0.26–1.65)
Lambda free light chains: 21.7 mg/L (ref 5.7–26.3)

## 2022-05-29 LAB — MULTIPLE MYELOMA PANEL, SERUM
Albumin SerPl Elph-Mcnc: 3.4 g/dL (ref 2.9–4.4)
Albumin/Glob SerPl: 1.3 (ref 0.7–1.7)
Alpha 1: 0.2 g/dL (ref 0.0–0.4)
Alpha2 Glob SerPl Elph-Mcnc: 0.5 g/dL (ref 0.4–1.0)
B-Globulin SerPl Elph-Mcnc: 0.7 g/dL (ref 0.7–1.3)
Gamma Glob SerPl Elph-Mcnc: 1.3 g/dL (ref 0.4–1.8)
Globulin, Total: 2.7 g/dL (ref 2.2–3.9)
IgA: 177 mg/dL (ref 61–437)
IgG (Immunoglobin G), Serum: 1282 mg/dL (ref 603–1613)
IgM (Immunoglobulin M), Srm: 34 mg/dL (ref 15–143)
Total Protein ELP: 6.1 g/dL (ref 6.0–8.5)

## 2022-06-09 ENCOUNTER — Other Ambulatory Visit: Payer: Self-pay | Admitting: *Deleted

## 2022-06-09 DIAGNOSIS — C9000 Multiple myeloma not having achieved remission: Secondary | ICD-10-CM

## 2022-06-09 MED ORDER — LENALIDOMIDE 10 MG PO CAPS: ORAL_CAPSULE | ORAL | 0 refills | Status: DC

## 2022-06-25 ENCOUNTER — Inpatient Hospital Stay: Payer: Medicare Other

## 2022-06-25 ENCOUNTER — Inpatient Hospital Stay: Payer: Medicare Other | Attending: Oncology | Admitting: Hematology and Oncology

## 2022-06-25 ENCOUNTER — Other Ambulatory Visit: Payer: Self-pay

## 2022-06-25 ENCOUNTER — Encounter: Payer: Self-pay | Admitting: Hematology and Oncology

## 2022-06-25 VITALS — BP 124/50 | HR 104 | Temp 97.5°F | Resp 18 | Ht 71.0 in | Wt 174.2 lb

## 2022-06-25 DIAGNOSIS — Z85820 Personal history of malignant melanoma of skin: Secondary | ICD-10-CM | POA: Diagnosis not present

## 2022-06-25 DIAGNOSIS — Z761 Encounter for health supervision and care of foundling: Secondary | ICD-10-CM | POA: Insufficient documentation

## 2022-06-25 DIAGNOSIS — Z7982 Long term (current) use of aspirin: Secondary | ICD-10-CM | POA: Diagnosis not present

## 2022-06-25 DIAGNOSIS — D649 Anemia, unspecified: Secondary | ICD-10-CM | POA: Diagnosis not present

## 2022-06-25 DIAGNOSIS — Z79899 Other long term (current) drug therapy: Secondary | ICD-10-CM | POA: Diagnosis not present

## 2022-06-25 DIAGNOSIS — C9000 Multiple myeloma not having achieved remission: Secondary | ICD-10-CM | POA: Insufficient documentation

## 2022-06-25 LAB — CBC WITH DIFFERENTIAL/PLATELET
Abs Immature Granulocytes: 0 10*3/uL (ref 0.00–0.07)
Basophils Absolute: 0 10*3/uL (ref 0.0–0.1)
Basophils Relative: 1 %
Eosinophils Absolute: 0.2 10*3/uL (ref 0.0–0.5)
Eosinophils Relative: 5 %
HCT: 29.7 % — ABNORMAL LOW (ref 39.0–52.0)
Hemoglobin: 9.4 g/dL — ABNORMAL LOW (ref 13.0–17.0)
Immature Granulocytes: 0 %
Lymphocytes Relative: 30 %
Lymphs Abs: 1 10*3/uL (ref 0.7–4.0)
MCH: 25.8 pg — ABNORMAL LOW (ref 26.0–34.0)
MCHC: 31.6 g/dL (ref 30.0–36.0)
MCV: 81.6 fL (ref 80.0–100.0)
Monocytes Absolute: 0.3 10*3/uL (ref 0.1–1.0)
Monocytes Relative: 10 %
Neutro Abs: 1.7 10*3/uL (ref 1.7–7.7)
Neutrophils Relative %: 54 %
Platelets: 89 10*3/uL — ABNORMAL LOW (ref 150–400)
RBC: 3.64 MIL/uL — ABNORMAL LOW (ref 4.22–5.81)
RDW: 14.5 % (ref 11.5–15.5)
WBC: 3.2 10*3/uL — ABNORMAL LOW (ref 4.0–10.5)
nRBC: 0 % (ref 0.0–0.2)

## 2022-06-25 LAB — COMPREHENSIVE METABOLIC PANEL
ALT: 13 U/L (ref 0–44)
AST: 15 U/L (ref 15–41)
Albumin: 3.6 g/dL (ref 3.5–5.0)
Alkaline Phosphatase: 50 U/L (ref 38–126)
Anion gap: 5 (ref 5–15)
BUN: 20 mg/dL (ref 8–23)
CO2: 28 mmol/L (ref 22–32)
Calcium: 8.4 mg/dL — ABNORMAL LOW (ref 8.9–10.3)
Chloride: 106 mmol/L (ref 98–111)
Creatinine, Ser: 1.24 mg/dL (ref 0.61–1.24)
GFR, Estimated: 55 mL/min — ABNORMAL LOW (ref >60)
Glucose, Bld: 85 mg/dL (ref 70–99)
Potassium: 3.5 mmol/L (ref 3.5–5.1)
Sodium: 139 mmol/L (ref 135–145)
Total Bilirubin: 0.6 mg/dL (ref 0.3–1.2)
Total Protein: 6.7 g/dL (ref 6.5–8.1)

## 2022-06-25 NOTE — Progress Notes (Signed)
Joe [name]  [phone]) [phone] [phone]) [phone]     ID: Joe [name] DOB: 11-03-1929  MR#: [id number]  SUP#:[id number]  Patient Care Team: [name], MD as PCP - General (Internal Medicine) Magrinat, Virgie Dad, MD (Inactive) as Consulting Physician (Oncology) Long-Stokes, Adalberto Ill, MD  CHIEF COMPLAINT: multiple myeloma  CURRENT TREATMENT: denosumab Xgeva every 12 weeks; lenalidomide 10 mg 21/7  INTERVAL HISTORY:  Joe [name] returns today for follow up and treatment of his multiple myeloma.  Since his last visit, he continues to deny any new complaints.  The rib pain that he previously mentioned has resolved.  He had a mild cold and some wheezing but no fevers or chills.  No new shortness of breath.  He is compliant with aspirin and Revlimid.  Lab Results  Component Value Date   KAPLAMBRATIO 2.41 (H) 05/25/2022   KAPLAMBRATIO 2.19 (H) 03/26/2022   KAPLAMBRATIO 2.79 (H) 02/26/2022   KAPLAMBRATIO 2.06 (H) 01/15/2022   KAPLAMBRATIO 1.73 (H) 11/27/2021    REVIEW OF SYSTEMS: Rest of the pertinent 10 point ROS reviewed and negative   COVID 19 VACCINATION STATUS: received [place] x2 with booster October 2021.  He received evusheld on 03/20/2021 and is due a second dose   HISTORY OF CURRENT ILLNESS:  From the original intake note:  Mr. Joe [name] is an 88 y/o [place] man formerly followed by my partner Dr Julien Nordmann for M-GUS. We have an M-Spike of 0.87 documented 07/12/2012. [name] kappa/lambda light chains and total IgG were follower yearly until 01/2016, when they were 2.36 and 1517 respectively (unremarkable). He was lost to follow-up after that point.  [02/29/2020] the patient presented to Urgent Care with poorly controlled pain. This was felt to be mussculoskeletal and he was started on a medrol dose-pak and robaxin. However his pain worsened and he presented to the ED at [facility] where vitals and exam were unremarkable but  labs showed a creatinine of 1.84 (baseline 1.0), calcium 12.7 with albumin 2.8 and total protein 10.4 (globulin fraction 7.8).  CT of the abdomen obtained for evaluation of his abdominal and back pain showed no hydronephrosis but multiple lytic lesons.   The patient's subsequent history is as detailed below.  PAST MEDICAL HISTORY: Past Medical History:  Diagnosis Date   Enlarged prostate    Hypertension    Melanoma ([place]) 2021    PAST SURGICAL HISTORY: Past Surgical History:  Procedure Laterality Date   EYE SURGERY     PROSTATE SURGERY      FAMILY HISTORY Family History  Problem Relation Age of Onset   Hypertension Mother     SOCIAL HISTORY:  Retired, used to work for the [employer] Lives with wife [place]. Has 4 children, 2 in [place], one in [place], one in [place]; 5 grandchildren and 5 great-grandchildren. Attends a [employer].    ADVANCED DIRECTIVES: at the initial visit the patient confirmed his wife is his HCPOA (despite her memory issues).   HEALTH MAINTENANCE: Social History   Tobacco Use   Smoking status: Never   Smokeless tobacco: Never  Vaping Use   Vaping Use: Never used  Substance Use Topics   Alcohol use: Never   Drug use: Never     Colonoscopy: 6-8 years ago     No Known Allergies  Current Outpatient Medications  Medication Sig Dispense Refill   acyclovir (ZOVIRAX) 400 MG tablet Take 1 tablet (400 mg total) by mouth 2 (two) times daily. 180 tablet 3   amLODipine (  NORVASC) 5 MG tablet Take 5 mg by mouth daily.     aspirin EC 81 MG tablet Take 81 mg by mouth daily. Swallow whole.      calcium carbonate (TUMS - DOSED IN MG ELEMENTAL CALCIUM) 500 MG chewable tablet Chew 3 tablets by mouth daily.     doxazosin (CARDURA) 4 MG tablet Take 4 mg by mouth at bedtime.     famotidine (PEPCID) 20 MG tablet Take 1 tablet (20 mg total) by mouth daily. 90 tablet 1   finasteride (PROSCAR) 5 MG tablet Take 1 tablet by mouth once daily  30 tablet 6   gabapentin (NEURONTIN) 100 MG capsule Take 2 capsules (200 mg total) by mouth at bedtime. 90 capsule 4   indapamide (LOZOL) 2.5 MG tablet Take 2.5 mg by mouth daily.      lenalidomide (REVLIMID) 10 MG capsule Take 1 tablet daily for 21 days then 7 days off every 28 days  Celgene BMS Josem Kaufmann [id number] 21 capsule 0   ondansetron (ZOFRAN) 8 MG tablet TAKE 1 TABLET BY MOUTH THREE TIMES DAILY AS NEEDED FOR NAUSEA OR VOMITING 20 tablet 0   polyethylene glycol powder (MIRALAX) 17 GM/SCOOP powder Take 255 g by mouth daily. 255 g 0   potassium chloride (KLOR-CON) 10 MEQ tablet Take 2 tablets (20 mEq total) by mouth daily. 180 tablet 1   pravastatin (PRAVACHOL) 40 MG tablet Take 40 mg by mouth daily.     prochlorperazine (COMPAZINE) 10 MG tablet TAKE 1 TABLET BY MOUTH EVERY 6 HOURS AS NEEDED FOR NAUSEA AND VOMITING 30 tablet 0   tamsulosin (FLOMAX) 0.4 MG CAPS capsule TAKE 1 CAPSULE BY MOUTH ONCE DAILY AFTER SUPPER 90 capsule 0   traMADol (ULTRAM) 50 MG tablet TAKE 1 TABLET BY MOUTH EVERY 8 HOURS AS NEEDED. 30 tablet 0   No current facility-administered medications for this visit.    OBJECTIVE: African-American man in no acute distress There were no vitals filed for this visit.       There is no height or weight on file to calculate BMI.   Wt Readings from Last 3 Encounters:  05/25/22 180 lb 1.6 oz (81.7 kg)  03/26/22 179 lb 11.2 oz (81.5 kg)  02/26/22 177 lb 11.2 oz (80.6 kg)  ECOG FS:1 - Symptomatic but completely ambulatory  Physical Exam Constitutional:      Appearance: Normal appearance.  HENT:     Head: Normocephalic and atraumatic.  Cardiovascular:     Rate and Rhythm: Normal rate and regular rhythm.  Abdominal:     General: Abdomen is flat.     Palpations: Abdomen is soft.  Musculoskeletal:        General: Swelling (Some swelling at the costochondral junction at the left fifth/sixth rib) present. No tenderness.     Cervical back: Normal range of motion and neck supple.   Neurological:     General: No focal deficit present.     Mental Status: He is alert.  Psychiatric:        Mood and Affect: Mood normal.      LAB RESULTS:  CMP     Component Value Date/Time   NA 138 05/25/2022 1254   NA 140 01/09/2016 1045   K 3.7 05/25/2022 1254   K 3.8 01/09/2016 1045   CL 105 05/25/2022 1254   CO2 28 05/25/2022 1254   CO2 27 01/09/2016 1045   GLUCOSE 93 05/25/2022 1254   GLUCOSE 89 01/09/2016 1045   BUN 19 05/25/2022 1254  BUN 14.7 01/09/2016 1045   CREATININE 1.24 05/25/2022 1254   CREATININE 1.0 01/09/2016 1045   CALCIUM 9.3 05/25/2022 1254   CALCIUM 9.2 01/09/2016 1045   PROT 6.4 (L) 05/25/2022 1254   PROT 7.3 01/09/2016 1045   ALBUMIN 3.6 05/25/2022 1254   ALBUMIN 3.7 01/09/2016 1045   AST 15 05/25/2022 1254   AST 18 01/09/2016 1045   ALT 14 05/25/2022 1254   ALT 15 01/09/2016 1045   ALKPHOS 41 05/25/2022 1254   ALKPHOS 60 01/09/2016 1045   BILITOT 0.6 05/25/2022 1254   BILITOT 0.51 01/09/2016 1045   GFRNONAA 55 (L) 05/25/2022 1254   GFRAA >60 08/30/2020 1402    Lab Results  Component Value Date   TOTALPROTELP 6.1 05/25/2022   ALBUMINELP 56.1 07/12/2012   A1GS 3.5 07/12/2012   A2GS 9.8 07/12/2012   BETS 5.1 07/12/2012   BETA2SER 3.6 07/12/2012   GAMS 21.9 (H) 07/12/2012   MSPIKE 0.87 07/12/2012   SPEI * 07/12/2012     Lab Results  Component Value Date   KPAFRELGTCHN 52.3 (H) 05/25/2022   LAMBDASER 21.7 05/25/2022   KAPLAMBRATIO 2.41 (H) 05/25/2022    Lab Results  Component Value Date   WBC 2.5 (L) 05/25/2022   NEUTROABS 1.1 (L) 05/25/2022   HGB 8.9 (L) 05/25/2022   HCT 28.4 (L) 05/25/2022   MCV 81.8 05/25/2022   PLT 120 (L) 05/25/2022      Chemistry      Component Value Date/Time   NA 138 05/25/2022 1254   NA 140 01/09/2016 1045   K 3.7 05/25/2022 1254   K 3.8 01/09/2016 1045   CL 105 05/25/2022 1254   CO2 28 05/25/2022 1254   CO2 27 01/09/2016 1045   BUN 19 05/25/2022 1254   BUN 14.7 01/09/2016 1045    CREATININE 1.24 05/25/2022 1254   CREATININE 1.0 01/09/2016 1045      Component Value Date/Time   CALCIUM 9.3 05/25/2022 1254   CALCIUM 9.2 01/09/2016 1045   ALKPHOS 41 05/25/2022 1254   ALKPHOS 60 01/09/2016 1045   AST 15 05/25/2022 1254   AST 18 01/09/2016 1045   ALT 14 05/25/2022 1254   ALT 15 01/09/2016 1045   BILITOT 0.6 05/25/2022 1254   BILITOT 0.51 01/09/2016 1045       No results found for: "LABCA2"  No components found for: "OIZTIW580"  No results for input(s): "INR" in the last 168 hours.  No results found for: "LABCA2"  No results found for: "DXI338"  No results found for: "CAN125"  No results found for: "CAN153"  No results found for: "CA2729"  No components found for: "HGQUANT"  No results found for: "CEA1", "CEA" / No results found for: "CEA1", "CEA"   No results found for: "AFPTUMOR"  No results found for: "CHROMOGRNA"  No results found for: "HGBA", "HGBA2QUANT", "HGBFQUANT", "HGBSQUAN" (Hemoglobinopathy evaluation)   Lab Results  Component Value Date   LDH 136 01/09/2016    Lab Results  Component Value Date   IRON 28 (L) 03/26/2022   TIBC 304 03/26/2022   IRONPCTSAT 9 (L) 03/26/2022   (Iron and TIBC)  Lab Results  Component Value Date   FERRITIN 142 05/25/2022    Urinalysis    Component Value Date/Time   COLORURINE AMBER (A) 03/01/2020 1052   APPEARANCEUR CLOUDY (A) 03/01/2020 1052   LABSPEC 1.021 03/01/2020 1052   PHURINE 5.0 03/01/2020 1052   GLUCOSEU NEGATIVE 03/01/2020 [place] 03/01/2020 [place] 03/01/2020  [place] 03/01/2020 1052   PROTEINUR 100 (A) 03/01/2020 1052   UROBILINOGEN 0.2 02/29/2020 1537   NITRITE NEGATIVE 03/01/2020 1052   LEUKOCYTESUR NEGATIVE 03/01/2020 1052    STUDIES: No results found.   ELIGIBLE FOR AVAILABLE RESEARCH PROTOCOL:   ASSESSMENT: 88 y.o.  76 [place] man with a history of M-GUS, presenting 02/29/2020 with a high globulin  fraction, worsening renal function, hypercalcemia, anemia and multiple lytic bone lesions.    (1) IgG Kappa Multiple Myeloma: labs diagnostic on 03/01/2020 with Mspike of 3.8, Kappa free light chain 690.6, and ratio 117.05.  (a) CT A/P on 03/01/2020 shows "innumerable" lytic lesions involving all visualized bones  (b) bone marrow biopsy 03/11/2020 shows 20% plasmacytosis by CD138, 12% by manual differential; molecular studies not requested  (c) Bortezomib and Dexamethasone given weekly 3 weeks on and 1 week off beginning 03/11/2020, with good response, last dose 07/24/2020  (d) transitioned to lenalidomide 10 mg daily 21/7 starting 07/31/2020    (2) lytic bone lesions/ hypercalcemia:  (a) Pamidronate administered on 03/01/2020  (b) denosumab/Xgeva started 04/17/2020-- repeat every 12 weeks   (a) to take TUMS 3 tabs on day of Xgeva dose  PLAN:   Mr. Becvar is doing quite well, continues on oral Revlimid maintenance along with aspirin prophylaxis.    No concerning review of systems or physical examination findings  He cytopenias are likely related to Revlimid since he has no evidence of myeloma relapse.  His the multiple myeloma labs including SPEP and kappa lambda ratio are stable. Labs from today are pending. He will return to clinic in 4 weeks.  We have discussed about duration of Revlimid maintenance today.  It looks like from the most recent data in frail patients, recommendation is to continue single agent Revlimid until progression. He is a pretty robust 88 year old, will discuss with him about indefinite Revlimid until progression.  Total encounter time 20 minutes.*   *Total Encounter Time as defined by the Centers for Medicare and Medicaid Services includes, in addition to the face-to-face time of a patient visit (documented in the note above) non-face-to-face time: obtaining and reviewing outside history, ordering and reviewing medications, tests or procedures, care coordination  (communications with other health care professionals or caregivers) and documentation in the medical record.

## 2022-06-26 LAB — KAPPA/LAMBDA LIGHT CHAINS
Kappa free light chain: 64.5 mg/L — ABNORMAL HIGH (ref 3.3–19.4)
Kappa, lambda light chain ratio: 2.4 — ABNORMAL HIGH (ref 0.26–1.65)
Lambda free light chains: 26.9 mg/L — ABNORMAL HIGH (ref 5.7–26.3)

## 2022-06-29 LAB — MULTIPLE MYELOMA PANEL, SERUM
Albumin SerPl Elph-Mcnc: 3.1 g/dL (ref 2.9–4.4)
Albumin/Glob SerPl: 1.1 (ref 0.7–1.7)
Alpha 1: 0.2 g/dL (ref 0.0–0.4)
Alpha2 Glob SerPl Elph-Mcnc: 0.6 g/dL (ref 0.4–1.0)
B-Globulin SerPl Elph-Mcnc: 0.8 g/dL (ref 0.7–1.3)
Gamma Glob SerPl Elph-Mcnc: 1.3 g/dL (ref 0.4–1.8)
Globulin, Total: 2.9 g/dL (ref 2.2–3.9)
IgA: 189 mg/dL (ref 61–437)
IgG (Immunoglobin G), Serum: 1266 mg/dL (ref 603–1613)
IgM (Immunoglobulin M), Srm: 33 mg/dL (ref 15–143)
Total Protein ELP: 6 g/dL (ref 6.0–8.5)

## 2022-07-02 ENCOUNTER — Other Ambulatory Visit: Payer: Self-pay | Admitting: *Deleted

## 2022-07-02 DIAGNOSIS — C9000 Multiple myeloma not having achieved remission: Secondary | ICD-10-CM

## 2022-07-02 MED ORDER — LENALIDOMIDE 10 MG PO CAPS: ORAL_CAPSULE | ORAL | 0 refills | Status: DC

## 2022-07-28 ENCOUNTER — Encounter: Payer: Self-pay | Admitting: Hematology and Oncology

## 2022-07-28 ENCOUNTER — Inpatient Hospital Stay: Payer: Medicare Other

## 2022-07-28 ENCOUNTER — Other Ambulatory Visit: Payer: Self-pay | Admitting: *Deleted

## 2022-07-28 ENCOUNTER — Inpatient Hospital Stay: Payer: Medicare Other | Attending: Hematology and Oncology | Admitting: Hematology and Oncology

## 2022-07-28 ENCOUNTER — Other Ambulatory Visit: Payer: Self-pay

## 2022-07-28 VITALS — BP 130/43 | HR 54 | Temp 97.7°F | Resp 18 | Ht 71.0 in | Wt 175.8 lb

## 2022-07-28 DIAGNOSIS — Z7982 Long term (current) use of aspirin: Secondary | ICD-10-CM | POA: Insufficient documentation

## 2022-07-28 DIAGNOSIS — Z85820 Personal history of malignant melanoma of skin: Secondary | ICD-10-CM | POA: Insufficient documentation

## 2022-07-28 DIAGNOSIS — Z79624 Long term (current) use of inhibitors of nucleotide synthesis: Secondary | ICD-10-CM | POA: Diagnosis not present

## 2022-07-28 DIAGNOSIS — I10 Essential (primary) hypertension: Secondary | ICD-10-CM | POA: Insufficient documentation

## 2022-07-28 DIAGNOSIS — Z79899 Other long term (current) drug therapy: Secondary | ICD-10-CM | POA: Insufficient documentation

## 2022-07-28 DIAGNOSIS — D649 Anemia, unspecified: Secondary | ICD-10-CM | POA: Diagnosis not present

## 2022-07-28 DIAGNOSIS — C9000 Multiple myeloma not having achieved remission: Secondary | ICD-10-CM

## 2022-07-28 DIAGNOSIS — N400 Enlarged prostate without lower urinary tract symptoms: Secondary | ICD-10-CM | POA: Insufficient documentation

## 2022-07-28 DIAGNOSIS — Z7961 Long term (current) use of immunomodulator: Secondary | ICD-10-CM | POA: Insufficient documentation

## 2022-07-28 LAB — CBC WITH DIFFERENTIAL/PLATELET
Abs Immature Granulocytes: 0.01 10*3/uL (ref 0.00–0.07)
Basophils Absolute: 0 10*3/uL (ref 0.0–0.1)
Basophils Relative: 1 %
Eosinophils Absolute: 0.2 10*3/uL (ref 0.0–0.5)
Eosinophils Relative: 6 %
HCT: 26.9 % — ABNORMAL LOW (ref 39.0–52.0)
Hemoglobin: 8.6 g/dL — ABNORMAL LOW (ref 13.0–17.0)
Immature Granulocytes: 0 %
Lymphocytes Relative: 29 %
Lymphs Abs: 1 10*3/uL (ref 0.7–4.0)
MCH: 26.1 pg (ref 26.0–34.0)
MCHC: 32 g/dL (ref 30.0–36.0)
MCV: 81.5 fL (ref 80.0–100.0)
Monocytes Absolute: 0.4 10*3/uL (ref 0.1–1.0)
Monocytes Relative: 11 %
Neutro Abs: 1.9 10*3/uL (ref 1.7–7.7)
Neutrophils Relative %: 53 %
Platelets: 70 10*3/uL — ABNORMAL LOW (ref 150–400)
RBC: 3.3 MIL/uL — ABNORMAL LOW (ref 4.22–5.81)
RDW: 15.5 % (ref 11.5–15.5)
WBC: 3.6 10*3/uL — ABNORMAL LOW (ref 4.0–10.5)
nRBC: 0 % (ref 0.0–0.2)

## 2022-07-28 LAB — COMPREHENSIVE METABOLIC PANEL
ALT: 11 U/L (ref 0–44)
AST: 13 U/L — ABNORMAL LOW (ref 15–41)
Albumin: 3.7 g/dL (ref 3.5–5.0)
Alkaline Phosphatase: 43 U/L (ref 38–126)
Anion gap: 2 — ABNORMAL LOW (ref 5–15)
BUN: 19 mg/dL (ref 8–23)
CO2: 29 mmol/L (ref 22–32)
Calcium: 8.8 mg/dL — ABNORMAL LOW (ref 8.9–10.3)
Chloride: 108 mmol/L (ref 98–111)
Creatinine, Ser: 1.14 mg/dL (ref 0.61–1.24)
GFR, Estimated: >60 mL/min (ref >60)
Glucose, Bld: 90 mg/dL (ref 70–99)
Potassium: 3.6 mmol/L (ref 3.5–5.1)
Sodium: 139 mmol/L (ref 135–145)
Total Bilirubin: 0.5 mg/dL (ref 0.3–1.2)
Total Protein: 6.3 g/dL — ABNORMAL LOW (ref 6.5–8.1)

## 2022-07-28 NOTE — Progress Notes (Signed)
[place]  [phone] Fax:(336) [phone]     ID: Joe [name] DOB: [birth year]  MR#: [id number]  PTW#:[id number]  Patient Care Team: [name], MD as PCP - General (Internal Medicine) Magrinat, Virgie Dad, MD (Inactive) as Consulting Physician (Oncology) Long-Stokes, Adalberto Ill, MD  CHIEF COMPLAINT: multiple myeloma  CURRENT TREATMENT: denosumab Xgeva every 12 weeks; lenalidomide 10 mg 21/7  INTERVAL HISTORY:  Joe [name] returns today for follow up and treatment of his multiple myeloma.  Since his last visit, he continues to deny any new complaints.   He continues to feel tired,  otherwise doesn't feel much different. He is taking aspirin, rib pain has resolved. He is trying to stay active as best as he can.  Lab Results  Component Value Date   KAPLAMBRATIO 2.40 (H) 06/25/2022   KAPLAMBRATIO 2.41 (H) 05/25/2022   KAPLAMBRATIO 2.19 (H) 03/26/2022   KAPLAMBRATIO 2.79 (H) 02/26/2022   KAPLAMBRATIO 2.06 (H) 01/15/2022    REVIEW OF SYSTEMS: Rest of the pertinent 10 point ROS reviewed and negative   COVID 19 VACCINATION STATUS: received [place] x2 with booster October 2021.  He received evusheld on 03/20/2021 and is due a second dose   HISTORY OF CURRENT ILLNESS:  From the original intake note:  Joe [name] is an 88 y/o [place] man formerly followed by my partner Dr Julien Nordmann for M-GUS. We have an M-Spike of 0.87 documented 07/12/2012. [name] kappa/lambda light chains and total IgG were follower yearly until 01/2016, when they were 2.36 and 1517 respectively (unremarkable). He was lost to follow-up after that point.  [02/29/2020] the patient presented to Urgent Care with poorly controlled pain. This was felt to be mussculoskeletal and he was started on a medrol dose-pak and robaxin. However his pain worsened and he presented to the ED at [facility] where vitals and exam were unremarkable but labs showed a creatinine of  1.84 (baseline 1.0), calcium 12.7 with albumin 2.8 and total protein 10.4 (globulin fraction 7.8).  CT of the abdomen obtained for evaluation of his abdominal and back pain showed no hydronephrosis but multiple lytic lesons.   The patient's subsequent history is as detailed below.  PAST MEDICAL HISTORY: Past Medical History:  Diagnosis Date   Enlarged prostate    Hypertension    Melanoma ([place]) 2021    PAST SURGICAL HISTORY: Past Surgical History:  Procedure Laterality Date   EYE SURGERY     PROSTATE SURGERY      FAMILY HISTORY Family History  Problem Relation Age of Onset   Hypertension Mother     SOCIAL HISTORY:  Retired, used to work for the [employer] Lives with wife [place]. Has 4 children, 2 in [place], one in [place], one in [place]; 5 grandchildren and 5 great-grandchildren. Attends a [employer].    ADVANCED DIRECTIVES: at the initial visit the patient confirmed his wife is his HCPOA (despite her memory issues).   HEALTH MAINTENANCE: Social History   Tobacco Use   Smoking status: Never   Smokeless tobacco: Never  Vaping Use   Vaping Use: Never used  Substance Use Topics   Alcohol use: Never   Drug use: Never     Colonoscopy: 6-8 years ago     No Known Allergies  Current Outpatient Medications  Medication Sig Dispense Refill   acyclovir (ZOVIRAX) 400 MG tablet Take 1 tablet (400 mg total) by mouth 2 (two) times daily. 180 tablet 3   amLODipine (NORVASC) 5 MG tablet Take 5  mg by mouth daily.     aspirin EC 81 MG tablet Take 81 mg by mouth daily. Swallow whole.      calcium carbonate (TUMS - DOSED IN MG ELEMENTAL CALCIUM) 500 MG chewable tablet Chew 3 tablets by mouth daily.     doxazosin (CARDURA) 4 MG tablet Take 4 mg by mouth at bedtime.     famotidine (PEPCID) 20 MG tablet Take 1 tablet (20 mg total) by mouth daily. 90 tablet 1   finasteride (PROSCAR) 5 MG tablet Take 1 tablet by mouth once daily 30 tablet 6   gabapentin  (NEURONTIN) 100 MG capsule Take 2 capsules (200 mg total) by mouth at bedtime. 90 capsule 4   indapamide (LOZOL) 2.5 MG tablet Take 2.5 mg by mouth daily.      lenalidomide (REVLIMID) 10 MG capsule Take 1 tablet daily for 21 days then 7 days off every 28 days  Celgene BMS Josem Kaufmann [id number] 21 capsule 0   ondansetron (ZOFRAN) 8 MG tablet TAKE 1 TABLET BY MOUTH THREE TIMES DAILY AS NEEDED FOR NAUSEA OR VOMITING 20 tablet 0   polyethylene glycol powder (MIRALAX) 17 GM/SCOOP powder Take 255 g by mouth daily. 255 g 0   potassium chloride (KLOR-CON) 10 MEQ tablet Take 2 tablets (20 mEq total) by mouth daily. 180 tablet 1   pravastatin (PRAVACHOL) 40 MG tablet Take 40 mg by mouth daily.     prochlorperazine (COMPAZINE) 10 MG tablet TAKE 1 TABLET BY MOUTH EVERY 6 HOURS AS NEEDED FOR NAUSEA AND VOMITING 30 tablet 0   tamsulosin (FLOMAX) 0.4 MG CAPS capsule TAKE 1 CAPSULE BY MOUTH ONCE DAILY AFTER SUPPER 90 capsule 0   traMADol (ULTRAM) 50 MG tablet TAKE 1 TABLET BY MOUTH EVERY 8 HOURS AS NEEDED. 30 tablet 0   No current facility-administered medications for this visit.    OBJECTIVE: African-American man in no acute distress Vitals:   07/28/22 1134  BP: (!) 130/43  Pulse: (!) 54  Resp: 18  Temp: 97.7 F (36.5 C)  SpO2: 100%         Body mass index is 24.52 kg/m.   Wt Readings from Last 3 Encounters:  07/28/22 175 lb 12.8 oz (79.7 kg)  06/25/22 174 lb 3.2 oz (79 kg)  05/25/22 180 lb 1.6 oz (81.7 kg)  ECOG FS:1 - Symptomatic but completely ambulatory  Physical Exam Constitutional:      Appearance: Normal appearance.  HENT:     Head: Normocephalic and atraumatic.  Cardiovascular:     Rate and Rhythm: Normal rate and regular rhythm.  Abdominal:     General: Abdomen is flat.     Palpations: Abdomen is soft.  Musculoskeletal:        General: No swelling (Swelling at the costochondral junction has nearly resolved compared to last visit) or tenderness.     Cervical back: Normal range of  motion and neck supple.  Neurological:     General: No focal deficit present.     Mental Status: He is alert.  Psychiatric:        Mood and Affect: Mood normal.      LAB RESULTS:  CMP     Component Value Date/Time   NA 139 06/25/2022 1318   NA 140 01/09/2016 1045   K 3.5 06/25/2022 1318   K 3.8 01/09/2016 1045   CL 106 06/25/2022 1318   CO2 28 06/25/2022 1318   CO2 27 01/09/2016 1045   GLUCOSE 85 06/25/2022 1318   GLUCOSE 89  01/09/2016 1045   BUN 20 06/25/2022 1318   BUN 14.7 01/09/2016 1045   CREATININE 1.24 06/25/2022 1318   CREATININE 1.24 05/25/2022 1254   CREATININE 1.0 01/09/2016 1045   CALCIUM 8.4 (L) 06/25/2022 1318   CALCIUM 9.2 01/09/2016 1045   PROT 6.7 06/25/2022 1318   PROT 7.3 01/09/2016 1045   ALBUMIN 3.6 06/25/2022 1318   ALBUMIN 3.7 01/09/2016 1045   AST 15 06/25/2022 1318   AST 15 05/25/2022 1254   AST 18 01/09/2016 1045   ALT 13 06/25/2022 1318   ALT 14 05/25/2022 1254   ALT 15 01/09/2016 1045   ALKPHOS 50 06/25/2022 1318   ALKPHOS 60 01/09/2016 1045   BILITOT 0.6 06/25/2022 1318   BILITOT 0.6 05/25/2022 1254   BILITOT 0.51 01/09/2016 1045   GFRNONAA 55 (L) 06/25/2022 1318   GFRNONAA 55 (L) 05/25/2022 1254   GFRAA >60 08/30/2020 1402    Lab Results  Component Value Date   TOTALPROTELP 6.0 06/25/2022   ALBUMINELP 56.1 07/12/2012   A1GS 3.5 07/12/2012   A2GS 9.8 07/12/2012   BETS 5.1 07/12/2012   BETA2SER 3.6 07/12/2012   GAMS 21.9 (H) 07/12/2012   MSPIKE 0.87 07/12/2012   SPEI * 07/12/2012     Lab Results  Component Value Date   KPAFRELGTCHN 64.5 (H) 06/25/2022   LAMBDASER 26.9 (H) 06/25/2022   KAPLAMBRATIO 2.40 (H) 06/25/2022    Lab Results  Component Value Date   WBC 3.2 (L) 06/25/2022   NEUTROABS 1.7 06/25/2022   HGB 9.4 (L) 06/25/2022   HCT 29.7 (L) 06/25/2022   MCV 81.6 06/25/2022   PLT 89 (L) 06/25/2022      Chemistry      Component Value Date/Time   NA 139 06/25/2022 1318   NA 140 01/09/2016 1045   K 3.5  06/25/2022 1318   K 3.8 01/09/2016 1045   CL 106 06/25/2022 1318   CO2 28 06/25/2022 1318   CO2 27 01/09/2016 1045   BUN 20 06/25/2022 1318   BUN 14.7 01/09/2016 1045   CREATININE 1.24 06/25/2022 1318   CREATININE 1.24 05/25/2022 1254   CREATININE 1.0 01/09/2016 1045      Component Value Date/Time   CALCIUM 8.4 (L) 06/25/2022 1318   CALCIUM 9.2 01/09/2016 1045   ALKPHOS 50 06/25/2022 1318   ALKPHOS 60 01/09/2016 1045   AST 15 06/25/2022 1318   AST 15 05/25/2022 1254   AST 18 01/09/2016 1045   ALT 13 06/25/2022 1318   ALT 14 05/25/2022 1254   ALT 15 01/09/2016 1045   BILITOT 0.6 06/25/2022 1318   BILITOT 0.6 05/25/2022 1254   BILITOT 0.51 01/09/2016 1045       No results found for: "LABCA2"  No components found for: "MVEHMC947"  No results for input(s): "INR" in the last 168 hours.  No results found for: "LABCA2"  No results found for: "SJG283"  No results found for: "CAN125"  No results found for: "CAN153"  No results found for: "CA2729"  No components found for: "HGQUANT"  No results found for: "CEA1", "CEA" / No results found for: "CEA1", "CEA"   No results found for: "AFPTUMOR"  No results found for: "CHROMOGRNA"  No results found for: "HGBA", "HGBA2QUANT", "HGBFQUANT", "HGBSQUAN" (Hemoglobinopathy evaluation)   Lab Results  Component Value Date   LDH 136 01/09/2016    Lab Results  Component Value Date   IRON 28 (L) 03/26/2022   TIBC 304 03/26/2022   IRONPCTSAT 9 (L) 03/26/2022   (Iron and  TIBC)  Lab Results  Component Value Date   FERRITIN 142 05/25/2022    Urinalysis    Component Value Date/Time   COLORURINE AMBER (A) 03/01/2020 1052   APPEARANCEUR CLOUDY (A) 03/01/2020 1052   LABSPEC 1.021 03/01/2020 1052   PHURINE 5.0 03/01/2020 1052   GLUCOSEU NEGATIVE 03/01/2020 1052   HGBUR NEGATIVE 03/01/2020 1052   BILIRUBINUR NEGATIVE 03/01/2020 1052   KETONESUR NEGATIVE 03/01/2020 1052   PROTEINUR 100 (A) 03/01/2020 1052    UROBILINOGEN 0.2 02/29/2020 1537   NITRITE NEGATIVE 03/01/2020 1052   LEUKOCYTESUR NEGATIVE 03/01/2020 1052    STUDIES: No results found.   ELIGIBLE FOR AVAILABLE RESEARCH PROTOCOL:   ASSESSMENT: 88 y.o.  30 [place] man with a history of M-GUS, presenting 02/29/2020 with a high globulin fraction, worsening renal function, hypercalcemia, anemia and multiple lytic bone lesions.    (1) IgG Kappa Multiple Myeloma: labs diagnostic on 03/01/2020 with Mspike of 3.8, Kappa free light chain 690.6, and ratio 117.05.  (a) CT A/P on 03/01/2020 shows "innumerable" lytic lesions involving all visualized bones  (b) bone marrow biopsy 03/11/2020 shows 20% plasmacytosis by CD138, 12% by manual differential; molecular studies not requested  (c) Bortezomib and Dexamethasone given weekly 3 weeks on and 1 week off beginning 03/11/2020, with good response, last dose 07/24/2020  (d) transitioned to lenalidomide 10 mg daily 21/7 starting 07/31/2020    (2) lytic bone lesions/ hypercalcemia:  (a) Pamidronate administered on 03/01/2020  (b) denosumab/Xgeva started 04/17/2020-- repeat every 12 weeks   (a) to take TUMS 3 tabs on day of Xgeva dose  PLAN:   Joe [name] is doing quite well, continues on oral Revlimid maintenance along with aspirin prophylaxis.    No concerning review of systems or physical examination findings today except for grade 1 fatigue. He cytopenias are likely related to Revlimid since he has no evidence of myeloma relapse.   Last multiple myeloma labs at the end of July with no evidence of monoclonal protein.  He cytopenias are stable, kappa lambda ratio is stable over the past 9 months. We will stretch his visits to every 8 weeks with labs. We have discussed about duration of Revlimid maintenance today.  It looks like from the most recent data in frail patients, recommendation is to continue single agent Revlimid until progression. He is a pretty robust 88 year old, will discuss with him about  indefinite Revlimid until progression. He is agreeable to continuing this.  We once again discussed about long-term risks of secondary malignancies with Revlimid as well as increased risk of blood clots with Revlimid. Total encounter time 20 minutes.*   *Total Encounter Time as defined by the Centers for Medicare and Medicaid Services includes, in addition to the face-to-face time of a patient visit (documented in the note above) non-face-to-face time: obtaining and reviewing outside history, ordering and reviewing medications, tests or procedures, care coordination (communications with other health care professionals or caregivers) and documentation in the medical record.

## 2022-07-29 LAB — KAPPA/LAMBDA LIGHT CHAINS
Kappa free light chain: 67.2 mg/L — ABNORMAL HIGH (ref 3.3–19.4)
Kappa, lambda light chain ratio: 2.25 — ABNORMAL HIGH (ref 0.26–1.65)
Lambda free light chains: 29.9 mg/L — ABNORMAL HIGH (ref 5.7–26.3)

## 2022-08-04 LAB — MULTIPLE MYELOMA PANEL, SERUM
Albumin SerPl Elph-Mcnc: 3.3 g/dL (ref 2.9–4.4)
Albumin/Glob SerPl: 1.3 (ref 0.7–1.7)
Alpha 1: 0.2 g/dL (ref 0.0–0.4)
Alpha2 Glob SerPl Elph-Mcnc: 0.6 g/dL (ref 0.4–1.0)
B-Globulin SerPl Elph-Mcnc: 0.7 g/dL (ref 0.7–1.3)
Gamma Glob SerPl Elph-Mcnc: 1.2 g/dL (ref 0.4–1.8)
Globulin, Total: 2.7 g/dL (ref 2.2–3.9)
IgA: 213 mg/dL (ref 61–437)
IgG (Immunoglobin G), Serum: 1360 mg/dL (ref 603–1613)
IgM (Immunoglobulin M), Srm: 26 mg/dL (ref 15–143)
Total Protein ELP: 6 g/dL (ref 6.0–8.5)

## 2022-08-05 ENCOUNTER — Other Ambulatory Visit: Payer: Self-pay | Admitting: *Deleted

## 2022-08-05 DIAGNOSIS — C9000 Multiple myeloma not having achieved remission: Secondary | ICD-10-CM

## 2022-08-05 MED ORDER — LENALIDOMIDE 10 MG PO CAPS: ORAL_CAPSULE | ORAL | 0 refills | Status: DC

## 2022-08-11 ENCOUNTER — Other Ambulatory Visit: Payer: Self-pay | Admitting: Hematology and Oncology

## 2022-08-11 ENCOUNTER — Other Ambulatory Visit: Payer: Self-pay | Admitting: *Deleted

## 2022-08-11 DIAGNOSIS — C9000 Multiple myeloma not having achieved remission: Secondary | ICD-10-CM

## 2022-08-11 MED ORDER — LENALIDOMIDE 10 MG PO CAPS: ORAL_CAPSULE | ORAL | 0 refills | Status: DC

## 2022-08-18 ENCOUNTER — Inpatient Hospital Stay (HOSPITAL_BASED_OUTPATIENT_CLINIC_OR_DEPARTMENT_OTHER): Payer: Medicare Other | Admitting: Hematology and Oncology

## 2022-08-18 ENCOUNTER — Inpatient Hospital Stay: Payer: Medicare Other

## 2022-08-18 ENCOUNTER — Encounter: Payer: Self-pay | Admitting: Hematology and Oncology

## 2022-08-18 ENCOUNTER — Other Ambulatory Visit: Payer: Self-pay

## 2022-08-18 ENCOUNTER — Inpatient Hospital Stay: Payer: Medicare Other | Attending: Oncology

## 2022-08-18 ENCOUNTER — Telehealth: Payer: Self-pay

## 2022-08-18 VITALS — BP 141/49 | HR 64 | Temp 97.7°F | Resp 16 | Ht 71.0 in | Wt 180.4 lb

## 2022-08-18 DIAGNOSIS — I10 Essential (primary) hypertension: Secondary | ICD-10-CM | POA: Diagnosis not present

## 2022-08-18 DIAGNOSIS — N400 Enlarged prostate without lower urinary tract symptoms: Secondary | ICD-10-CM | POA: Diagnosis not present

## 2022-08-18 DIAGNOSIS — C9000 Multiple myeloma not having achieved remission: Secondary | ICD-10-CM | POA: Diagnosis not present

## 2022-08-18 DIAGNOSIS — D709 Neutropenia, unspecified: Secondary | ICD-10-CM

## 2022-08-18 DIAGNOSIS — Z79624 Long term (current) use of inhibitors of nucleotide synthesis: Secondary | ICD-10-CM | POA: Diagnosis not present

## 2022-08-18 DIAGNOSIS — D649 Anemia, unspecified: Secondary | ICD-10-CM

## 2022-08-18 DIAGNOSIS — Z7961 Long term (current) use of immunomodulator: Secondary | ICD-10-CM | POA: Insufficient documentation

## 2022-08-18 DIAGNOSIS — Z7982 Long term (current) use of aspirin: Secondary | ICD-10-CM | POA: Insufficient documentation

## 2022-08-18 DIAGNOSIS — Z85820 Personal history of malignant melanoma of skin: Secondary | ICD-10-CM | POA: Insufficient documentation

## 2022-08-18 DIAGNOSIS — C9001 Multiple myeloma in remission: Secondary | ICD-10-CM

## 2022-08-18 DIAGNOSIS — D696 Thrombocytopenia, unspecified: Secondary | ICD-10-CM | POA: Diagnosis not present

## 2022-08-18 DIAGNOSIS — Z7189 Other specified counseling: Secondary | ICD-10-CM

## 2022-08-18 DIAGNOSIS — Z79899 Other long term (current) drug therapy: Secondary | ICD-10-CM | POA: Diagnosis not present

## 2022-08-18 LAB — CBC WITH DIFFERENTIAL/PLATELET
Abs Immature Granulocytes: 0.01 10*3/uL (ref 0.00–0.07)
Basophils Absolute: 0 10*3/uL (ref 0.0–0.1)
Basophils Relative: 1 %
Eosinophils Absolute: 0.1 10*3/uL (ref 0.0–0.5)
Eosinophils Relative: 5 %
HCT: 29.2 % — ABNORMAL LOW (ref 39.0–52.0)
Hemoglobin: 9.2 g/dL — ABNORMAL LOW (ref 13.0–17.0)
Immature Granulocytes: 0 %
Lymphocytes Relative: 38 %
Lymphs Abs: 0.9 10*3/uL (ref 0.7–4.0)
MCH: 26.2 pg (ref 26.0–34.0)
MCHC: 31.5 g/dL (ref 30.0–36.0)
MCV: 83.2 fL (ref 80.0–100.0)
Monocytes Absolute: 0.4 10*3/uL (ref 0.1–1.0)
Monocytes Relative: 14 %
Neutro Abs: 1 10*3/uL — ABNORMAL LOW (ref 1.7–7.7)
Neutrophils Relative %: 42 %
Platelets: 112 10*3/uL — ABNORMAL LOW (ref 150–400)
RBC: 3.51 MIL/uL — ABNORMAL LOW (ref 4.22–5.81)
RDW: 15.7 % — ABNORMAL HIGH (ref 11.5–15.5)
WBC: 2.5 10*3/uL — ABNORMAL LOW (ref 4.0–10.5)
nRBC: 0 % (ref 0.0–0.2)

## 2022-08-18 LAB — COMPREHENSIVE METABOLIC PANEL
ALT: 10 U/L (ref 0–44)
AST: 13 U/L — ABNORMAL LOW (ref 15–41)
Albumin: 3.6 g/dL (ref 3.5–5.0)
Alkaline Phosphatase: 50 U/L (ref 38–126)
Anion gap: 3 — ABNORMAL LOW (ref 5–15)
BUN: 21 mg/dL (ref 8–23)
CO2: 28 mmol/L (ref 22–32)
Calcium: 8.6 mg/dL — ABNORMAL LOW (ref 8.9–10.3)
Chloride: 108 mmol/L (ref 98–111)
Creatinine, Ser: 1.21 mg/dL (ref 0.61–1.24)
GFR, Estimated: 57 mL/min — ABNORMAL LOW (ref >60)
Glucose, Bld: 97 mg/dL (ref 70–99)
Potassium: 4.3 mmol/L (ref 3.5–5.1)
Sodium: 139 mmol/L (ref 135–145)
Total Bilirubin: 0.6 mg/dL (ref 0.3–1.2)
Total Protein: 6.5 g/dL (ref 6.5–8.1)

## 2022-08-18 MED ORDER — DENOSUMAB 120 MG/1.7ML ~~LOC~~ SOLN
120.0000 mg | Freq: Once | SUBCUTANEOUS | Status: AC
  Administered 2022-08-18: 120 mg via SUBCUTANEOUS
  Filled 2022-08-18: qty 1.7

## 2022-08-18 NOTE — Progress Notes (Addendum)
[place]  Telephone:(336) [phone] Fax:(336) [phone]     ID: Joe [name] DOB: 31-May-1929  MR#: [id number]  ZPH#:[id number]  Patient Care Team: [name], MD as PCP - General (Internal Medicine) Magrinat, Virgie Dad, MD (Inactive) as Consulting Physician (Oncology) Long-Stokes, Adalberto Ill, MD  CHIEF COMPLAINT: multiple myeloma  CURRENT TREATMENT: denosumab Xgeva every 12 weeks; lenalidomide 10 mg 21/7  INTERVAL HISTORY:  Patient is here for follow-up.  He continues to feel well since his last visit.  He is still very independent, denies any new bone pains.  No new B symptoms.  He is compliant with Revlimid and aspirin.  No bleeding issues reported.  Lab Results  Component Value Date   KAPLAMBRATIO 2.25 (H) 07/28/2022   KAPLAMBRATIO 2.40 (H) 06/25/2022   KAPLAMBRATIO 2.41 (H) 05/25/2022   KAPLAMBRATIO 2.19 (H) 03/26/2022   KAPLAMBRATIO 2.79 (H) 02/26/2022    REVIEW OF SYSTEMS:  Rest of the pertinent 10 point ROS reviewed and negative   COVID 19 VACCINATION STATUS: received [place] x2 with booster October 2021.  He received evusheld on 03/20/2021 and is due a second dose   HISTORY OF CURRENT ILLNESS:  From the original intake note:  Joe [name] is an 88 y/o [place] man formerly followed by my partner Dr Julien Nordmann for M-GUS. We have an M-Spike of 0.87 documented 07/12/2012. [name] kappa/lambda light chains and total IgG were follower yearly until 01/2016, when they were 2.36 and 1517 respectively (unremarkable). He was lost to follow-up after that point.  [02/29/2020] the patient presented to Urgent Care with poorly controlled pain. This was felt to be mussculoskeletal and he was started on a medrol dose-pak and robaxin. However his pain worsened and he presented to the ED at [facility] where vitals and exam were unremarkable but labs showed a creatinine of 1.84 (baseline 1.0), calcium 12.7 with albumin 2.8 and total protein  10.4 (globulin fraction 7.8).  CT of the abdomen obtained for evaluation of his abdominal and back pain showed no hydronephrosis but multiple lytic lesons.   The patient's subsequent history is as detailed below.  PAST MEDICAL HISTORY: Past Medical History:  Diagnosis Date   Enlarged prostate    Hypertension    Melanoma ([place]) 2021    PAST SURGICAL HISTORY: Past Surgical History:  Procedure Laterality Date   EYE SURGERY     PROSTATE SURGERY      FAMILY HISTORY Family History  Problem Relation Age of Onset   Hypertension Mother     SOCIAL HISTORY:  Retired, used to work for the [employer] Lives with wife [place]. Has 4 children, 2 in [place], one in [place], one in [place]; 5 grandchildren and 5 great-grandchildren. Attends a [employer].    ADVANCED DIRECTIVES: at the initial visit the patient confirmed his wife is his HCPOA (despite her memory issues).   HEALTH MAINTENANCE: Social History   Tobacco Use   Smoking status: Never   Smokeless tobacco: Never  Vaping Use   Vaping Use: Never used  Substance Use Topics   Alcohol use: Never   Drug use: Never     Colonoscopy: 6-8 years ago     No Known Allergies  Current Outpatient Medications  Medication Sig Dispense Refill   acyclovir (ZOVIRAX) 400 MG tablet Take 1 tablet (400 mg total) by mouth 2 (two) times daily. 180 tablet 3   amLODipine (NORVASC) 5 MG tablet Take 5 mg by mouth daily.     aspirin EC 81  MG tablet Take 81 mg by mouth daily. Swallow whole.      calcium carbonate (TUMS - DOSED IN MG ELEMENTAL CALCIUM) 500 MG chewable tablet Chew 3 tablets by mouth daily.     doxazosin (CARDURA) 4 MG tablet Take 4 mg by mouth at bedtime.     famotidine (PEPCID) 20 MG tablet Take 1 tablet (20 mg total) by mouth daily. 90 tablet 1   finasteride (PROSCAR) 5 MG tablet Take 1 tablet by mouth once daily 30 tablet 6   gabapentin (NEURONTIN) 100 MG capsule Take 2 capsules (200 mg total) by mouth at  bedtime. 90 capsule 4   indapamide (LOZOL) 2.5 MG tablet Take 2.5 mg by mouth daily.      lenalidomide (REVLIMID) 10 MG capsule Take 1 tablet daily for 21 days then 7 days off every 28 days  Celgene BMS Josem Kaufmann [id number] 21 capsule 0   ondansetron (ZOFRAN) 8 MG tablet TAKE 1 TABLET BY MOUTH THREE TIMES DAILY AS NEEDED FOR NAUSEA OR VOMITING 20 tablet 0   polyethylene glycol powder (MIRALAX) 17 GM/SCOOP powder Take 255 g by mouth daily. 255 g 0   potassium chloride (KLOR-CON) 10 MEQ tablet Take 2 tablets (20 mEq total) by mouth daily. 180 tablet 1   pravastatin (PRAVACHOL) 40 MG tablet Take 40 mg by mouth daily.     prochlorperazine (COMPAZINE) 10 MG tablet TAKE 1 TABLET BY MOUTH EVERY 6 HOURS AS NEEDED FOR NAUSEA AND VOMITING 30 tablet 0   tamsulosin (FLOMAX) 0.4 MG CAPS capsule TAKE 1 CAPSULE BY MOUTH ONCE DAILY AFTER SUPPER 90 capsule 0   traMADol (ULTRAM) 50 MG tablet TAKE 1 TABLET BY MOUTH EVERY 8 HOURS AS NEEDED. 30 tablet 0   No current facility-administered medications for this visit.    OBJECTIVE: African-American man in no acute distress Vitals:   08/18/22 1306  BP: (!) 141/49  Pulse: 64  Resp: 16  Temp: 97.7 F (36.5 C)  SpO2: 100%         Body mass index is 25.16 kg/m.   Wt Readings from Last 3 Encounters:  08/18/22 180 lb 6.4 oz (81.8 kg)  07/28/22 175 lb 12.8 oz (79.7 kg)  06/25/22 174 lb 3.2 oz (79 kg)  ECOG FS:1 - Symptomatic but completely ambulatory  Physical Exam Constitutional:      Appearance: Normal appearance.  HENT:     Head: Normocephalic and atraumatic.  Cardiovascular:     Rate and Rhythm: Normal rate and regular rhythm.  Abdominal:     General: Abdomen is flat.     Palpations: Abdomen is soft.  Musculoskeletal:        General: Swelling (Mild swelling at the costochondral junction on the left, no significant change since last visit) present. No tenderness.     Cervical back: Normal range of motion and neck supple.  Neurological:     General: No  focal deficit present.     Mental Status: He is alert.  Psychiatric:        Mood and Affect: Mood normal.      LAB RESULTS:  CMP     Component Value Date/Time   NA 139 08/18/2022 1213   NA 140 01/09/2016 1045   K PENDING 08/18/2022 1213   K 3.8 01/09/2016 1045   CL 108 08/18/2022 1213   CO2 28 08/18/2022 1213   CO2 27 01/09/2016 1045   GLUCOSE PENDING 08/18/2022 1213   GLUCOSE 89 01/09/2016 1045   BUN PENDING 08/18/2022 1213  BUN 14.7 01/09/2016 1045   CREATININE PENDING 08/18/2022 1213   CREATININE 1.24 05/25/2022 1254   CREATININE 1.0 01/09/2016 1045   CALCIUM PENDING 08/18/2022 1213   CALCIUM 9.2 01/09/2016 1045   PROT PENDING 08/18/2022 1213   PROT 7.3 01/09/2016 1045   ALBUMIN PENDING 08/18/2022 1213   ALBUMIN 3.7 01/09/2016 1045   AST PENDING 08/18/2022 1213   AST 15 05/25/2022 1254   AST 18 01/09/2016 1045   ALT PENDING 08/18/2022 1213   ALT 14 05/25/2022 1254   ALT 15 01/09/2016 1045   ALKPHOS PENDING 08/18/2022 1213   ALKPHOS 60 01/09/2016 1045   BILITOT PENDING 08/18/2022 1213   BILITOT 0.6 05/25/2022 1254   BILITOT 0.51 01/09/2016 1045   GFRNONAA PENDING 08/18/2022 1213   GFRNONAA 55 (L) 05/25/2022 1254   GFRAA >60 08/30/2020 1402    Lab Results  Component Value Date   TOTALPROTELP 6.0 07/28/2022   ALBUMINELP 56.1 07/12/2012   A1GS 3.5 07/12/2012   A2GS 9.8 07/12/2012   BETS 5.1 07/12/2012   BETA2SER 3.6 07/12/2012   GAMS 21.9 (H) 07/12/2012   MSPIKE 0.87 07/12/2012   SPEI * 07/12/2012     Lab Results  Component Value Date   KPAFRELGTCHN 67.2 (H) 07/28/2022   LAMBDASER 29.9 (H) 07/28/2022   KAPLAMBRATIO 2.25 (H) 07/28/2022    Lab Results  Component Value Date   WBC 2.5 (L) 08/18/2022   NEUTROABS 1.0 (L) 08/18/2022   HGB 9.2 (L) 08/18/2022   HCT 29.2 (L) 08/18/2022   MCV 83.2 08/18/2022   PLT 112 (L) 08/18/2022      Chemistry      Component Value Date/Time   NA 139 08/18/2022 1213   NA 140 01/09/2016 1045   K PENDING  08/18/2022 1213   K 3.8 01/09/2016 1045   CL 108 08/18/2022 1213   CO2 28 08/18/2022 1213   CO2 27 01/09/2016 1045   BUN PENDING 08/18/2022 1213   BUN 14.7 01/09/2016 1045   CREATININE PENDING 08/18/2022 1213   CREATININE 1.24 05/25/2022 1254   CREATININE 1.0 01/09/2016 1045      Component Value Date/Time   CALCIUM PENDING 08/18/2022 1213   CALCIUM 9.2 01/09/2016 1045   ALKPHOS PENDING 08/18/2022 1213   ALKPHOS 60 01/09/2016 1045   AST PENDING 08/18/2022 1213   AST 15 05/25/2022 1254   AST 18 01/09/2016 1045   ALT PENDING 08/18/2022 1213   ALT 14 05/25/2022 1254   ALT 15 01/09/2016 1045   BILITOT PENDING 08/18/2022 1213   BILITOT 0.6 05/25/2022 1254   BILITOT 0.51 01/09/2016 1045       No results found for: "LABCA2"  No components found for: "YVOPFY924"  No results for input(s): "INR" in the last 168 hours.  No results found for: "LABCA2"  No results found for: "MQK863"  No results found for: "CAN125"  No results found for: "CAN153"  No results found for: "CA2729"  No components found for: "HGQUANT"  No results found for: "CEA1", "CEA" / No results found for: "CEA1", "CEA"   No results found for: "AFPTUMOR"  No results found for: "CHROMOGRNA"  No results found for: "HGBA", "HGBA2QUANT", "HGBFQUANT", "HGBSQUAN" (Hemoglobinopathy evaluation)   Lab Results  Component Value Date   LDH 136 01/09/2016    Lab Results  Component Value Date   IRON 28 (L) 03/26/2022   TIBC 304 03/26/2022   IRONPCTSAT 9 (L) 03/26/2022   (Iron and TIBC)  Lab Results  Component Value Date   FERRITIN 142  05/25/2022    Urinalysis    Component Value Date/Time   COLORURINE AMBER (A) 03/01/2020 1052   APPEARANCEUR CLOUDY (A) 03/01/2020 1052   LABSPEC 1.021 03/01/2020 1052   PHURINE 5.0 03/01/2020 1052   GLUCOSEU NEGATIVE 03/01/2020 1052   HGBUR NEGATIVE 03/01/2020 1052   BILIRUBINUR NEGATIVE 03/01/2020 1052   KETONESUR NEGATIVE 03/01/2020 1052   PROTEINUR 100 (A)  03/01/2020 1052   UROBILINOGEN 0.2 02/29/2020 1537   NITRITE NEGATIVE 03/01/2020 1052   LEUKOCYTESUR NEGATIVE 03/01/2020 1052    STUDIES: No results found.   ELIGIBLE FOR AVAILABLE RESEARCH PROTOCOL:   ASSESSMENT: 88 y.o.  10 [place] man with a history of M-GUS, presenting 02/29/2020 with a high globulin fraction, worsening renal function, hypercalcemia, anemia and multiple lytic bone lesions.    (1) IgG Kappa Multiple Myeloma: labs diagnostic on 03/01/2020 with Mspike of 3.8, Kappa free light chain 690.6, and ratio 117.05.  (a) CT A/P on 03/01/2020 shows "innumerable" lytic lesions involving all visualized bones  (b) bone marrow biopsy 03/11/2020 shows 20% plasmacytosis by CD138, 12% by manual differential; molecular studies not requested  (c) Bortezomib and Dexamethasone given weekly 3 weeks on and 1 week off beginning 03/11/2020, with good response, last dose 07/24/2020  (d) transitioned to lenalidomide 10 mg daily 21/7 starting 07/31/2020    (2) lytic bone lesions/ hypercalcemia:  (a) Pamidronate administered on 03/01/2020  (b) denosumab/Xgeva started 04/17/2020-- repeat every 12 weeks   (a) to take TUMS 3 tabs on day of Xgeva dose  PLAN:   Joe [name] is doing quite well, continues on oral Revlimid maintenance along with aspirin prophylaxis.    Labs in August without any concern for relapse.  CBC from today shows improvement in anemia and thrombocytopenia.  Okay to continue Revlimid at the current dose.   We will stretch his visits to every 8 weeks with labs. We have discussed about duration of Revlimid maintenance today.  It looks like from the most recent data in frail patients, recommendation is to continue single agent Revlimid until progression. He is agreeable to continuing this.  We once again discussed about long-term risks of secondary malignancies with Revlimid as well as increased risk of blood clots with Revlimid. No new dental concerns, okay to proceed with Prolia,  encouraged him to take calcium orally. Total encounter time 20 minutes.*   *Total Encounter Time as defined by the Centers for Medicare and Medicaid Services includes, in addition to the face-to-face time of a patient visit (documented in the note above) non-face-to-face time: obtaining and reviewing outside history, ordering and reviewing medications, tests or procedures, care coordination (communications with other health care professionals or caregivers) and documentation in the medical record.

## 2022-08-18 NOTE — Telephone Encounter (Signed)
Called patient to advise that his calcium was 8.6 mg/dl. Dr. Chryl Heck would like him to take 2 TUMS daily for two weeks. Pt verbalized understanding.

## 2022-08-28 ENCOUNTER — Other Ambulatory Visit: Payer: Self-pay | Admitting: Hematology and Oncology

## 2022-08-28 DIAGNOSIS — C9000 Multiple myeloma not having achieved remission: Secondary | ICD-10-CM

## 2022-09-03 DIAGNOSIS — C9000 Multiple myeloma not having achieved remission: Secondary | ICD-10-CM | POA: Diagnosis not present

## 2022-09-03 DIAGNOSIS — D509 Iron deficiency anemia, unspecified: Secondary | ICD-10-CM | POA: Diagnosis not present

## 2022-09-03 DIAGNOSIS — I10 Essential (primary) hypertension: Secondary | ICD-10-CM | POA: Diagnosis not present

## 2022-09-03 DIAGNOSIS — E785 Hyperlipidemia, unspecified: Secondary | ICD-10-CM | POA: Diagnosis not present

## 2022-09-03 DIAGNOSIS — D472 Monoclonal gammopathy: Secondary | ICD-10-CM | POA: Diagnosis not present

## 2022-09-07 ENCOUNTER — Other Ambulatory Visit: Payer: Self-pay | Admitting: *Deleted

## 2022-09-07 DIAGNOSIS — C9000 Multiple myeloma not having achieved remission: Secondary | ICD-10-CM

## 2022-09-07 MED ORDER — LENALIDOMIDE 10 MG PO CAPS: ORAL_CAPSULE | ORAL | 0 refills | Status: DC

## 2022-09-10 DIAGNOSIS — D472 Monoclonal gammopathy: Secondary | ICD-10-CM | POA: Diagnosis not present

## 2022-09-10 DIAGNOSIS — Z23 Encounter for immunization: Secondary | ICD-10-CM | POA: Diagnosis not present

## 2022-09-10 DIAGNOSIS — I10 Essential (primary) hypertension: Secondary | ICD-10-CM | POA: Diagnosis not present

## 2022-09-10 DIAGNOSIS — D509 Iron deficiency anemia, unspecified: Secondary | ICD-10-CM | POA: Diagnosis not present

## 2022-09-10 DIAGNOSIS — D61818 Other pancytopenia: Secondary | ICD-10-CM | POA: Diagnosis not present

## 2022-09-10 DIAGNOSIS — R5383 Other fatigue: Secondary | ICD-10-CM | POA: Diagnosis not present

## 2022-09-10 DIAGNOSIS — E785 Hyperlipidemia, unspecified: Secondary | ICD-10-CM | POA: Diagnosis not present

## 2022-09-10 DIAGNOSIS — C9000 Multiple myeloma not having achieved remission: Secondary | ICD-10-CM | POA: Diagnosis not present

## 2022-09-17 ENCOUNTER — Inpatient Hospital Stay: Payer: Medicare Other | Attending: Hematology and Oncology

## 2022-09-17 ENCOUNTER — Encounter: Payer: Self-pay | Admitting: Hematology and Oncology

## 2022-09-17 ENCOUNTER — Other Ambulatory Visit: Payer: Self-pay

## 2022-09-17 ENCOUNTER — Inpatient Hospital Stay (HOSPITAL_BASED_OUTPATIENT_CLINIC_OR_DEPARTMENT_OTHER): Payer: Medicare Other | Admitting: Hematology and Oncology

## 2022-09-17 ENCOUNTER — Inpatient Hospital Stay: Payer: Medicare Other

## 2022-09-17 VITALS — BP 128/44 | HR 52 | Temp 97.3°F | Resp 18 | Wt 175.2 lb

## 2022-09-17 DIAGNOSIS — C9001 Multiple myeloma in remission: Secondary | ICD-10-CM

## 2022-09-17 DIAGNOSIS — Z79624 Long term (current) use of inhibitors of nucleotide synthesis: Secondary | ICD-10-CM | POA: Diagnosis not present

## 2022-09-17 DIAGNOSIS — Z7982 Long term (current) use of aspirin: Secondary | ICD-10-CM | POA: Diagnosis not present

## 2022-09-17 DIAGNOSIS — I10 Essential (primary) hypertension: Secondary | ICD-10-CM | POA: Insufficient documentation

## 2022-09-17 DIAGNOSIS — C9000 Multiple myeloma not having achieved remission: Secondary | ICD-10-CM | POA: Diagnosis not present

## 2022-09-17 DIAGNOSIS — D649 Anemia, unspecified: Secondary | ICD-10-CM | POA: Diagnosis not present

## 2022-09-17 DIAGNOSIS — N400 Enlarged prostate without lower urinary tract symptoms: Secondary | ICD-10-CM | POA: Insufficient documentation

## 2022-09-17 DIAGNOSIS — Z23 Encounter for immunization: Secondary | ICD-10-CM | POA: Diagnosis not present

## 2022-09-17 DIAGNOSIS — Z79899 Other long term (current) drug therapy: Secondary | ICD-10-CM | POA: Insufficient documentation

## 2022-09-17 LAB — CBC WITH DIFFERENTIAL/PLATELET
Abs Immature Granulocytes: 0.01 10*3/uL (ref 0.00–0.07)
Basophils Absolute: 0 10*3/uL (ref 0.0–0.1)
Basophils Relative: 1 %
Eosinophils Absolute: 0.2 10*3/uL (ref 0.0–0.5)
Eosinophils Relative: 7 %
HCT: 31.1 % — ABNORMAL LOW (ref 39.0–52.0)
Hemoglobin: 9.7 g/dL — ABNORMAL LOW (ref 13.0–17.0)
Immature Granulocytes: 0 %
Lymphocytes Relative: 39 %
Lymphs Abs: 1.3 10*3/uL (ref 0.7–4.0)
MCH: 25.9 pg — ABNORMAL LOW (ref 26.0–34.0)
MCHC: 31.2 g/dL (ref 30.0–36.0)
MCV: 82.9 fL (ref 80.0–100.0)
Monocytes Absolute: 0.3 10*3/uL (ref 0.1–1.0)
Monocytes Relative: 9 %
Neutro Abs: 1.5 10*3/uL — ABNORMAL LOW (ref 1.7–7.7)
Neutrophils Relative %: 44 %
Platelets: 104 10*3/uL — ABNORMAL LOW (ref 150–400)
RBC: 3.75 MIL/uL — ABNORMAL LOW (ref 4.22–5.81)
RDW: 14.8 % (ref 11.5–15.5)
WBC: 3.4 10*3/uL — ABNORMAL LOW (ref 4.0–10.5)
nRBC: 0 % (ref 0.0–0.2)

## 2022-09-17 LAB — COMPREHENSIVE METABOLIC PANEL
ALT: 11 U/L (ref 0–44)
AST: 14 U/L — ABNORMAL LOW (ref 15–41)
Albumin: 3.6 g/dL (ref 3.5–5.0)
Alkaline Phosphatase: 52 U/L (ref 38–126)
Anion gap: 6 (ref 5–15)
BUN: 18 mg/dL (ref 8–23)
CO2: 28 mmol/L (ref 22–32)
Calcium: 9.3 mg/dL (ref 8.9–10.3)
Chloride: 105 mmol/L (ref 98–111)
Creatinine, Ser: 1.3 mg/dL — ABNORMAL HIGH (ref 0.61–1.24)
GFR, Estimated: 52 mL/min — ABNORMAL LOW (ref >60)
Glucose, Bld: 81 mg/dL (ref 70–99)
Potassium: 4.2 mmol/L (ref 3.5–5.1)
Sodium: 139 mmol/L (ref 135–145)
Total Bilirubin: 0.5 mg/dL (ref 0.3–1.2)
Total Protein: 6.8 g/dL (ref 6.5–8.1)

## 2022-09-17 NOTE — Progress Notes (Signed)
[facility]  [phone] [phone]) [phone]     ID: [name] DOB: [birth year]  MR#: [id number]  OZD#:[id number]  Patient Care Team: [name], MD as PCP - General (Internal Medicine) Magrinat, Virgie Dad, MD (Inactive) as Consulting Physician (Oncology) Long-Stokes, Adalberto Ill, MD  CHIEF COMPLAINT: multiple myeloma  CURRENT TREATMENT: denosumab Xgeva every 12 weeks; lenalidomide 10 mg 21/7  INTERVAL HISTORY:  Patient is here for follow-up.  He has been doing really well since his last visit. He has been taking aspirin and Revlimid as instructed.  No new bone pains.  No other B symptoms.  No change in breathing or bowel habits or urinary habits.  Rest of the pertinent 10 point ROS reviewed and negative.  Lab Results  Component Value Date   KAPLAMBRATIO 2.25 (H) 07/28/2022   KAPLAMBRATIO 2.40 (H) 06/25/2022   KAPLAMBRATIO 2.41 (H) 05/25/2022   KAPLAMBRATIO 2.19 (H) 03/26/2022   KAPLAMBRATIO 2.79 (H) 02/26/2022    REVIEW OF SYSTEMS:  Rest of the pertinent 10 point ROS reviewed and negative   COVID 19 VACCINATION STATUS: received [place] x2 with booster October 2021.  He received evusheld on 03/20/2021 and is due a second dose   HISTORY OF CURRENT ILLNESS:  From the original intake note:  Mr. Yurkovich is an 88 y/o [place] man formerly followed by my partner Dr Julien Nordmann for M-GUS. We have an M-Spike of 0.87 documented 07/12/2012. [name] kappa/lambda light chains and total IgG were follower yearly until 01/2016, when they were 2.36 and 1517 respectively (unremarkable). He was lost to follow-up after that point.  [02/29/2020] the patient presented to Urgent Care with poorly controlled pain. This was felt to be mussculoskeletal and he was started on a medrol dose-pak and robaxin. However his pain worsened and he presented to the ED at [facility] where vitals and exam were unremarkable but labs showed a creatinine of  1.84 (baseline 1.0), calcium 12.7 with albumin 2.8 and total protein 10.4 (globulin fraction 7.8).  CT of the abdomen obtained for evaluation of his abdominal and back pain showed no hydronephrosis but multiple lytic lesons.   The patient's subsequent history is as detailed below.  PAST MEDICAL HISTORY: Past Medical History:  Diagnosis Date   Enlarged prostate    Hypertension    Melanoma ([place]) 2021    PAST SURGICAL HISTORY: Past Surgical History:  Procedure Laterality Date   EYE SURGERY     PROSTATE SURGERY      FAMILY HISTORY Family History  Problem Relation Age of Onset   Hypertension Mother     SOCIAL HISTORY:  Retired, used to work for the [employer] Lives with wife [place]. Has 4 children, 2 in [place], one in [place], one in [place]; 5 grandchildren and 5 great-grandchildren. Attends a [employer].    ADVANCED DIRECTIVES: at the initial visit the patient confirmed his wife is his HCPOA (despite her memory issues).   HEALTH MAINTENANCE: Social History   Tobacco Use   Smoking status: Never   Smokeless tobacco: Never  Vaping Use   Vaping Use: Never used  Substance Use Topics   Alcohol use: Never   Drug use: Never     Colonoscopy: 6-8 years ago     No Known Allergies  Current Outpatient Medications  Medication Sig Dispense Refill   acyclovir (ZOVIRAX) 400 MG tablet Take 1 tablet (400 mg total) by mouth 2 (two) times daily. 180 tablet 3   amLODipine (NORVASC) 5 MG tablet  Take 5 mg by mouth daily.     aspirin EC 81 MG tablet Take 81 mg by mouth daily. Swallow whole.      calcium carbonate (TUMS - DOSED IN MG ELEMENTAL CALCIUM) 500 MG chewable tablet Chew 3 tablets by mouth daily.     doxazosin (CARDURA) 4 MG tablet Take 4 mg by mouth at bedtime.     famotidine (PEPCID) 20 MG tablet Take 1 tablet (20 mg total) by mouth daily. 90 tablet 1   finasteride (PROSCAR) 5 MG tablet Take 1 tablet by mouth once daily 30 tablet 6   gabapentin  (NEURONTIN) 100 MG capsule Take 2 capsules (200 mg total) by mouth at bedtime. 90 capsule 4   indapamide (LOZOL) 2.5 MG tablet Take 2.5 mg by mouth daily.      lenalidomide (REVLIMID) 10 MG capsule Take 1 tablet daily for 21 days then 7 days off every 28 days  Celgene BMS Josem Kaufmann [id number] 21 capsule 0   ondansetron (ZOFRAN) 8 MG tablet TAKE 1 TABLET BY MOUTH THREE TIMES DAILY AS NEEDED FOR NAUSEA OR VOMITING 20 tablet 0   polyethylene glycol powder (MIRALAX) 17 GM/SCOOP powder Take 255 g by mouth daily. 255 g 0   potassium chloride (KLOR-CON) 10 MEQ tablet Take 2 tablets by mouth once daily 180 tablet 0   pravastatin (PRAVACHOL) 40 MG tablet Take 40 mg by mouth daily.     prochlorperazine (COMPAZINE) 10 MG tablet TAKE 1 TABLET BY MOUTH EVERY 6 HOURS AS NEEDED FOR NAUSEA AND VOMITING 30 tablet 0   tamsulosin (FLOMAX) 0.4 MG CAPS capsule TAKE 1 CAPSULE BY MOUTH ONCE DAILY AFTER SUPPER 90 capsule 0   traMADol (ULTRAM) 50 MG tablet TAKE 1 TABLET BY MOUTH EVERY 8 HOURS AS NEEDED. 30 tablet 0   No current facility-administered medications for this visit.    OBJECTIVE: African-American man in no acute distress Vitals:   09/17/22 1228  BP: (!) 128/44  Pulse: (!) 52  Resp: 18  Temp: (!) 97.3 F (36.3 C)  SpO2: 99%         Body mass index is 24.44 kg/m.   Wt Readings from Last 3 Encounters:  09/17/22 175 lb 4 oz (79.5 kg)  08/18/22 180 lb 6.4 oz (81.8 kg)  07/28/22 175 lb 12.8 oz (79.7 kg)  ECOG FS:1 - Symptomatic but completely ambulatory  Physical Exam Constitutional:      Appearance: Normal appearance.  HENT:     Head: Normocephalic and atraumatic.  Cardiovascular:     Rate and Rhythm: Normal rate and regular rhythm.  Abdominal:     General: Abdomen is flat.     Palpations: Abdomen is soft.  Musculoskeletal:        General: Swelling (Mild swelling at the costochondral junction on the left, no significant change since last visit) present. No tenderness.     Cervical back: Normal  range of motion and neck supple.  Neurological:     General: No focal deficit present.     Mental Status: He is alert.  Psychiatric:        Mood and Affect: Mood normal.      LAB RESULTS:  CMP     Component Value Date/Time   NA 139 09/17/2022 1152   NA 140 01/09/2016 1045   K 4.2 09/17/2022 1152   K 3.8 01/09/2016 1045   CL 105 09/17/2022 1152   CO2 28 09/17/2022 1152   CO2 27 01/09/2016 1045   GLUCOSE 81 09/17/2022 1152  GLUCOSE 89 01/09/2016 1045   BUN 18 09/17/2022 1152   BUN 14.7 01/09/2016 1045   CREATININE 1.30 (H) 09/17/2022 1152   CREATININE 1.24 05/25/2022 1254   CREATININE 1.0 01/09/2016 1045   CALCIUM 9.3 09/17/2022 1152   CALCIUM 9.2 01/09/2016 1045   PROT 6.8 09/17/2022 1152   PROT 7.3 01/09/2016 1045   ALBUMIN 3.6 09/17/2022 1152   ALBUMIN 3.7 01/09/2016 1045   AST 14 (L) 09/17/2022 1152   AST 15 05/25/2022 1254   AST 18 01/09/2016 1045   ALT 11 09/17/2022 1152   ALT 14 05/25/2022 1254   ALT 15 01/09/2016 1045   ALKPHOS 52 09/17/2022 1152   ALKPHOS 60 01/09/2016 1045   BILITOT 0.5 09/17/2022 1152   BILITOT 0.6 05/25/2022 1254   BILITOT 0.51 01/09/2016 1045   GFRNONAA 52 (L) 09/17/2022 1152   GFRNONAA 55 (L) 05/25/2022 1254   GFRAA >60 08/30/2020 1402    Lab Results  Component Value Date   TOTALPROTELP 6.0 07/28/2022   ALBUMINELP 56.1 07/12/2012   A1GS 3.5 07/12/2012   A2GS 9.8 07/12/2012   BETS 5.1 07/12/2012   BETA2SER 3.6 07/12/2012   GAMS 21.9 (H) 07/12/2012   MSPIKE 0.87 07/12/2012   SPEI * 07/12/2012     Lab Results  Component Value Date   KPAFRELGTCHN 67.2 (H) 07/28/2022   LAMBDASER 29.9 (H) 07/28/2022   KAPLAMBRATIO 2.25 (H) 07/28/2022    Lab Results  Component Value Date   WBC 3.4 (L) 09/17/2022   NEUTROABS 1.5 (L) 09/17/2022   HGB 9.7 (L) 09/17/2022   HCT 31.1 (L) 09/17/2022   MCV 82.9 09/17/2022   PLT 104 (L) 09/17/2022      Chemistry      Component Value Date/Time   NA 139 09/17/2022 1152   NA 140  01/09/2016 1045   K 4.2 09/17/2022 1152   K 3.8 01/09/2016 1045   CL 105 09/17/2022 1152   CO2 28 09/17/2022 1152   CO2 27 01/09/2016 1045   BUN 18 09/17/2022 1152   BUN 14.7 01/09/2016 1045   CREATININE 1.30 (H) 09/17/2022 1152   CREATININE 1.24 05/25/2022 1254   CREATININE 1.0 01/09/2016 1045      Component Value Date/Time   CALCIUM 9.3 09/17/2022 1152   CALCIUM 9.2 01/09/2016 1045   ALKPHOS 52 09/17/2022 1152   ALKPHOS 60 01/09/2016 1045   AST 14 (L) 09/17/2022 1152   AST 15 05/25/2022 1254   AST 18 01/09/2016 1045   ALT 11 09/17/2022 1152   ALT 14 05/25/2022 1254   ALT 15 01/09/2016 1045   BILITOT 0.5 09/17/2022 1152   BILITOT 0.6 05/25/2022 1254   BILITOT 0.51 01/09/2016 1045       No results found for: "LABCA2"  No components found for: "WLSLHT342"  No results for input(s): "INR" in the last 168 hours.  No results found for: "LABCA2"  No results found for: "AJG811"  No results found for: "CAN125"  No results found for: "CAN153"  No results found for: "CA2729"  No components found for: "HGQUANT"  No results found for: "CEA1", "CEA" / No results found for: "CEA1", "CEA"   No results found for: "AFPTUMOR"  No results found for: "CHROMOGRNA"  No results found for: "HGBA", "HGBA2QUANT", "HGBFQUANT", "HGBSQUAN" (Hemoglobinopathy evaluation)   Lab Results  Component Value Date   LDH 136 01/09/2016    Lab Results  Component Value Date   IRON 28 (L) 03/26/2022   TIBC 304 03/26/2022   IRONPCTSAT 9 (L)  03/26/2022   (Iron and TIBC)  Lab Results  Component Value Date   FERRITIN 142 05/25/2022    Urinalysis    Component Value Date/Time   COLORURINE AMBER (A) 03/01/2020 1052   APPEARANCEUR CLOUDY (A) 03/01/2020 1052   LABSPEC 1.021 03/01/2020 1052   PHURINE 5.0 03/01/2020 1052   GLUCOSEU NEGATIVE 03/01/2020 1052   HGBUR NEGATIVE 03/01/2020 1052   BILIRUBINUR NEGATIVE 03/01/2020 1052   KETONESUR NEGATIVE 03/01/2020 1052   PROTEINUR 100 (A)  03/01/2020 1052   UROBILINOGEN 0.2 02/29/2020 1537   NITRITE NEGATIVE 03/01/2020 1052   LEUKOCYTESUR NEGATIVE 03/01/2020 1052    STUDIES: No results found.   ELIGIBLE FOR AVAILABLE RESEARCH PROTOCOL:   ASSESSMENT: 88 y.o.  68 [place] man with a history of M-GUS, presenting 02/29/2020 with a high globulin fraction, worsening renal function, hypercalcemia, anemia and multiple lytic bone lesions.    (1) IgG Kappa Multiple Myeloma: labs diagnostic on 03/01/2020 with Mspike of 3.8, Kappa free light chain 690.6, and ratio 117.05.  (a) CT A/P on 03/01/2020 shows "innumerable" lytic lesions involving all visualized bones  (b) bone marrow biopsy 03/11/2020 shows 20% plasmacytosis by CD138, 12% by manual differential; molecular studies not requested  (c) Bortezomib and Dexamethasone given weekly 3 weeks on and 1 week off beginning 03/11/2020, with good response, last dose 07/24/2020  (d) transitioned to lenalidomide 10 mg daily 21/7 starting 07/31/2020    (2) lytic bone lesions/ hypercalcemia:  (a) Pamidronate administered on 03/01/2020  (b) denosumab/Xgeva started 04/17/2020-- repeat every 12 weeks   (a) to take TUMS 3 tabs on day of Xgeva dose  PLAN:   Mr. Husmann is doing quite well, continues on oral Revlimid maintenance along with aspirin prophylaxis.    Labs in August without any concern for relapse.  We will repeat myeloma labs today in follow-up.  We will stretch his visits to every 8 weeks with labs.  He can continue aspirin and Revlimid maintenance as previously recommended. He will get Prolia in December, no new dental concerns reported. Return to clinic in 8 weeks with repeat labs.  No change in treatment at this time.  Total time spent: 20 min *Total Encounter Time as defined by the Centers for Medicare and Medicaid Services includes, in addition to the face-to-face time of a patient visit (documented in the note above) non-face-to-face time: obtaining and reviewing outside history,  ordering and reviewing medications, tests or procedures, care coordination (communications with other health care professionals or caregivers) and documentation in the medical record.

## 2022-09-18 LAB — IGG, IGA, IGM
IgA: 230 mg/dL (ref 61–437)
IgG (Immunoglobin G), Serum: 1445 mg/dL (ref 603–1613)
IgM (Immunoglobulin M), Srm: 39 mg/dL (ref 15–143)

## 2022-09-18 LAB — KAPPA/LAMBDA LIGHT CHAINS
Kappa free light chain: 67.8 mg/L — ABNORMAL HIGH (ref 3.3–19.4)
Kappa, lambda light chain ratio: 2.19 — ABNORMAL HIGH (ref 0.26–1.65)
Lambda free light chains: 30.9 mg/L — ABNORMAL HIGH (ref 5.7–26.3)

## 2022-09-22 DIAGNOSIS — H2512 Age-related nuclear cataract, left eye: Secondary | ICD-10-CM | POA: Diagnosis not present

## 2022-09-22 DIAGNOSIS — H5213 Myopia, bilateral: Secondary | ICD-10-CM | POA: Diagnosis not present

## 2022-09-22 LAB — PROTEIN ELECTROPHORESIS, SERUM, WITH REFLEX
A/G Ratio: 1.1 (ref 0.7–1.7)
Albumin ELP: 3.4 g/dL (ref 2.9–4.4)
Alpha-1-Globulin: 0.2 g/dL (ref 0.0–0.4)
Alpha-2-Globulin: 0.6 g/dL (ref 0.4–1.0)
Beta Globulin: 0.8 g/dL (ref 0.7–1.3)
Gamma Globulin: 1.4 g/dL (ref 0.4–1.8)
Globulin, Total: 3 g/dL (ref 2.2–3.9)
Total Protein ELP: 6.4 g/dL (ref 6.0–8.5)

## 2022-09-24 ENCOUNTER — Other Ambulatory Visit: Payer: Self-pay | Admitting: *Deleted

## 2022-09-24 DIAGNOSIS — C9000 Multiple myeloma not having achieved remission: Secondary | ICD-10-CM

## 2022-09-24 MED ORDER — ACYCLOVIR 400 MG PO TABS: 400.0000 mg | ORAL_TABLET | Freq: Two times a day (BID) | ORAL | 3 refills | Status: DC

## 2022-09-28 ENCOUNTER — Telehealth: Payer: Self-pay | Admitting: *Deleted

## 2022-09-28 ENCOUNTER — Other Ambulatory Visit: Payer: Self-pay | Admitting: Hematology and Oncology

## 2022-09-28 NOTE — Patient Outreach (Signed)
  Care Coordination   Initial Visit Note   09/28/2022 Name: Joe [name] MRN: [id number] DOB: Oct 23, 1929  Joe [name] is a 88 y.o. year old male who sees Joe Seashore, MD for primary care. I spoke with  Joe [name] by phone today.  What matters to the patients health and wellness today?  Pt declines need for Care Coordination at this time. Reminded pt about AWV. Pt states lives with wife of 23 years and is able-bodied and has daughters who help.    Goals Addressed   None     SDOH assessments and interventions completed:  Yes  SDOH Interventions Today    Flowsheet Row Most Recent Value  SDOH Interventions   Food Insecurity Interventions Intervention Not Indicated  Housing Interventions Intervention Not Indicated  Transportation Interventions Intervention Not Indicated        Care Coordination Interventions Activated:  Yes  Care Coordination Interventions:  Yes, provided   Follow up plan: No further intervention required.   Encounter Outcome:  Pt. Visit Completed

## 2022-10-01 ENCOUNTER — Other Ambulatory Visit: Payer: Self-pay | Admitting: *Deleted

## 2022-10-01 DIAGNOSIS — C9000 Multiple myeloma not having achieved remission: Secondary | ICD-10-CM

## 2022-10-01 MED ORDER — LENALIDOMIDE 10 MG PO CAPS: ORAL_CAPSULE | ORAL | 0 refills | Status: DC

## 2022-10-07 ENCOUNTER — Encounter: Payer: Self-pay | Admitting: Oncology

## 2022-10-13 ENCOUNTER — Ambulatory Visit: Payer: Medicare Other | Admitting: Hematology and Oncology

## 2022-10-13 ENCOUNTER — Other Ambulatory Visit: Payer: Medicare Other

## 2022-11-02 ENCOUNTER — Other Ambulatory Visit: Payer: Self-pay | Admitting: *Deleted

## 2022-11-02 DIAGNOSIS — C9000 Multiple myeloma not having achieved remission: Secondary | ICD-10-CM

## 2022-11-02 MED ORDER — LENALIDOMIDE 10 MG PO CAPS: ORAL_CAPSULE | ORAL | 0 refills | Status: DC

## 2022-11-02 NOTE — Telephone Encounter (Signed)
Received refill request for Lenalidomide from [facility]. Refill per MD OV note 09/17/22

## 2022-11-05 DIAGNOSIS — H269 Unspecified cataract: Secondary | ICD-10-CM | POA: Diagnosis not present

## 2022-11-05 DIAGNOSIS — H2512 Age-related nuclear cataract, left eye: Secondary | ICD-10-CM | POA: Diagnosis not present

## 2022-11-06 ENCOUNTER — Other Ambulatory Visit: Payer: Self-pay | Admitting: *Deleted

## 2022-11-06 MED ORDER — GABAPENTIN 100 MG PO CAPS: 100.0000 mg | ORAL_CAPSULE | Freq: Three times a day (TID) | ORAL | 4 refills | Status: DC

## 2022-11-12 ENCOUNTER — Inpatient Hospital Stay (HOSPITAL_COMMUNITY): Payer: Medicare Other

## 2022-11-12 ENCOUNTER — Inpatient Hospital Stay (HOSPITAL_BASED_OUTPATIENT_CLINIC_OR_DEPARTMENT_OTHER): Payer: Medicare Other | Admitting: Hematology and Oncology

## 2022-11-12 ENCOUNTER — Encounter: Payer: Self-pay | Admitting: Hematology and Oncology

## 2022-11-12 ENCOUNTER — Other Ambulatory Visit: Payer: Self-pay

## 2022-11-12 ENCOUNTER — Encounter (HOSPITAL_COMMUNITY): Payer: Self-pay | Admitting: Emergency Medicine

## 2022-11-12 ENCOUNTER — Inpatient Hospital Stay: Payer: Medicare Other | Attending: Hematology and Oncology

## 2022-11-12 ENCOUNTER — Inpatient Hospital Stay (HOSPITAL_COMMUNITY)
Admission: EM | Admit: 2022-11-12 | Discharge: 2022-11-14 | DRG: 243 | Disposition: A | Discharge status: Home / Self Care | Payer: Medicare Other | Attending: Cardiology | Admitting: Cardiology

## 2022-11-12 ENCOUNTER — Other Ambulatory Visit: Payer: Self-pay | Admitting: Pharmacist

## 2022-11-12 ENCOUNTER — Inpatient Hospital Stay: Payer: Medicare Other

## 2022-11-12 VITALS — BP 136/49 | HR 35 | Temp 97.3°F | Resp 16 | Wt 176.1 lb

## 2022-11-12 DIAGNOSIS — Z8249 Family history of ischemic heart disease and other diseases of the circulatory system: Secondary | ICD-10-CM | POA: Diagnosis not present

## 2022-11-12 DIAGNOSIS — N400 Enlarged prostate without lower urinary tract symptoms: Secondary | ICD-10-CM | POA: Insufficient documentation

## 2022-11-12 DIAGNOSIS — D696 Thrombocytopenia, unspecified: Secondary | ICD-10-CM | POA: Diagnosis not present

## 2022-11-12 DIAGNOSIS — I10 Essential (primary) hypertension: Secondary | ICD-10-CM

## 2022-11-12 DIAGNOSIS — I129 Hypertensive chronic kidney disease with stage 1 through stage 4 chronic kidney disease, or unspecified chronic kidney disease: Secondary | ICD-10-CM | POA: Diagnosis present

## 2022-11-12 DIAGNOSIS — Z79899 Other long term (current) drug therapy: Secondary | ICD-10-CM

## 2022-11-12 DIAGNOSIS — Z950 Presence of cardiac pacemaker: Secondary | ICD-10-CM | POA: Insufficient documentation

## 2022-11-12 DIAGNOSIS — Z79624 Long term (current) use of inhibitors of nucleotide synthesis: Secondary | ICD-10-CM | POA: Insufficient documentation

## 2022-11-12 DIAGNOSIS — R001 Bradycardia, unspecified: Secondary | ICD-10-CM | POA: Diagnosis not present

## 2022-11-12 DIAGNOSIS — D649 Anemia, unspecified: Secondary | ICD-10-CM | POA: Diagnosis not present

## 2022-11-12 DIAGNOSIS — C9000 Multiple myeloma not having achieved remission: Secondary | ICD-10-CM | POA: Insufficient documentation

## 2022-11-12 DIAGNOSIS — Z85820 Personal history of malignant melanoma of skin: Secondary | ICD-10-CM | POA: Diagnosis not present

## 2022-11-12 DIAGNOSIS — I442 Atrioventricular block, complete: Secondary | ICD-10-CM | POA: Diagnosis not present

## 2022-11-12 DIAGNOSIS — N1831 Chronic kidney disease, stage 3a: Secondary | ICD-10-CM

## 2022-11-12 DIAGNOSIS — E785 Hyperlipidemia, unspecified: Secondary | ICD-10-CM | POA: Diagnosis present

## 2022-11-12 DIAGNOSIS — Z7982 Long term (current) use of aspirin: Secondary | ICD-10-CM | POA: Insufficient documentation

## 2022-11-12 DIAGNOSIS — C9001 Multiple myeloma in remission: Secondary | ICD-10-CM

## 2022-11-12 DIAGNOSIS — N1832 Chronic kidney disease, stage 3b: Secondary | ICD-10-CM | POA: Diagnosis present

## 2022-11-12 HISTORY — DX: Multiple myeloma not having achieved remission: C90.00

## 2022-11-12 LAB — COMPREHENSIVE METABOLIC PANEL
ALT: 16 U/L (ref 0–44)
ALT: 18 U/L (ref 0–44)
AST: 16 U/L (ref 15–41)
AST: 19 U/L (ref 15–41)
Albumin: 3.9 g/dL (ref 3.5–5.0)
Albumin: 3.8 g/dL (ref 3.5–5.0)
Alkaline Phosphatase: 47 U/L (ref 38–126)
Alkaline Phosphatase: 42 U/L (ref 38–126)
Anion gap: 5 (ref 5–15)
Anion gap: 5 (ref 5–15)
BUN: 21 mg/dL (ref 8–23)
BUN: 25 mg/dL — ABNORMAL HIGH (ref 8–23)
CO2: 29 mmol/L (ref 22–32)
CO2: 27 mmol/L (ref 22–32)
Calcium: 9.9 mg/dL (ref 8.9–10.3)
Calcium: 9.3 mg/dL (ref 8.9–10.3)
Chloride: 106 mmol/L (ref 98–111)
Chloride: 108 mmol/L (ref 98–111)
Creatinine, Ser: 1.27 mg/dL — ABNORMAL HIGH (ref 0.61–1.24)
Creatinine, Ser: 1.21 mg/dL (ref 0.61–1.24)
GFR, Estimated: 54 mL/min — ABNORMAL LOW (ref >60)
GFR, Estimated: 57 mL/min — ABNORMAL LOW (ref >60)
Glucose, Bld: 76 mg/dL (ref 70–99)
Glucose, Bld: 97 mg/dL (ref 70–99)
Potassium: 4 mmol/L (ref 3.5–5.1)
Potassium: 3.9 mmol/L (ref 3.5–5.1)
Sodium: 140 mmol/L (ref 135–145)
Sodium: 140 mmol/L (ref 135–145)
Total Bilirubin: 0.6 mg/dL (ref 0.3–1.2)
Total Bilirubin: 0.8 mg/dL (ref 0.3–1.2)
Total Protein: 6.8 g/dL (ref 6.5–8.1)
Total Protein: 7.2 g/dL (ref 6.5–8.1)

## 2022-11-12 LAB — CBC WITH DIFFERENTIAL/PLATELET
Abs Immature Granulocytes: 0.01 10*3/uL (ref 0.00–0.07)
Abs Immature Granulocytes: 0.01 10*3/uL (ref 0.00–0.07)
Basophils Absolute: 0.1 10*3/uL (ref 0.0–0.1)
Basophils Absolute: 0.1 10*3/uL (ref 0.0–0.1)
Basophils Relative: 2 %
Basophils Relative: 2 %
Eosinophils Absolute: 0.2 10*3/uL (ref 0.0–0.5)
Eosinophils Absolute: 0.2 10*3/uL (ref 0.0–0.5)
Eosinophils Relative: 6 %
Eosinophils Relative: 5 %
HCT: 31.5 % — ABNORMAL LOW (ref 39.0–52.0)
HCT: 32.6 % — ABNORMAL LOW (ref 39.0–52.0)
Hemoglobin: 9.9 g/dL — ABNORMAL LOW (ref 13.0–17.0)
Hemoglobin: 10 g/dL — ABNORMAL LOW (ref 13.0–17.0)
Immature Granulocytes: 0 %
Immature Granulocytes: 0 %
Lymphocytes Relative: 38 %
Lymphocytes Relative: 39 %
Lymphs Abs: 1.1 10*3/uL (ref 0.7–4.0)
Lymphs Abs: 1.3 10*3/uL (ref 0.7–4.0)
MCH: 26.1 pg (ref 26.0–34.0)
MCH: 25.8 pg — ABNORMAL LOW (ref 26.0–34.0)
MCHC: 31.4 g/dL (ref 30.0–36.0)
MCHC: 30.7 g/dL (ref 30.0–36.0)
MCV: 82.9 fL (ref 80.0–100.0)
MCV: 84.2 fL (ref 80.0–100.0)
Monocytes Absolute: 0.6 10*3/uL (ref 0.1–1.0)
Monocytes Absolute: 0.6 10*3/uL (ref 0.1–1.0)
Monocytes Relative: 20 %
Monocytes Relative: 19 %
Neutro Abs: 1 10*3/uL — ABNORMAL LOW (ref 1.7–7.7)
Neutro Abs: 1.1 10*3/uL — ABNORMAL LOW (ref 1.7–7.7)
Neutrophils Relative %: 34 %
Neutrophils Relative %: 35 %
Platelets: 104 10*3/uL — ABNORMAL LOW (ref 150–400)
Platelets: 113 10*3/uL — ABNORMAL LOW (ref 150–400)
RBC: 3.8 MIL/uL — ABNORMAL LOW (ref 4.22–5.81)
RBC: 3.87 MIL/uL — ABNORMAL LOW (ref 4.22–5.81)
RDW: 15.1 % (ref 11.5–15.5)
RDW: 15 % (ref 11.5–15.5)
WBC: 3 10*3/uL — ABNORMAL LOW (ref 4.0–10.5)
WBC: 3.2 10*3/uL — ABNORMAL LOW (ref 4.0–10.5)
nRBC: 0 % (ref 0.0–0.2)
nRBC: 0 % (ref 0.0–0.2)

## 2022-11-12 LAB — I-STAT CHEM 8, ED
BUN: 22 mg/dL (ref 8–23)
Calcium, Ion: 1.3 mmol/L (ref 1.15–1.40)
Chloride: 102 mmol/L (ref 98–111)
Creatinine, Ser: 1.3 mg/dL — ABNORMAL HIGH (ref 0.61–1.24)
Glucose, Bld: 93 mg/dL (ref 70–99)
HCT: 35 % — ABNORMAL LOW (ref 39.0–52.0)
Hemoglobin: 11.9 g/dL — ABNORMAL LOW (ref 13.0–17.0)
Potassium: 3.9 mmol/L (ref 3.5–5.1)
Sodium: 140 mmol/L (ref 135–145)
TCO2: 26 mmol/L (ref 22–32)

## 2022-11-12 LAB — TSH: TSH: 1.799 u[IU]/mL (ref 0.350–4.500)

## 2022-11-12 LAB — MAGNESIUM: Magnesium: 1.5 mg/dL — ABNORMAL LOW (ref 1.7–2.4)

## 2022-11-12 MED ORDER — CALCIUM CARBONATE ANTACID 500 MG PO CHEW
3.0000 | CHEWABLE_TABLET | Freq: Every day | ORAL | Status: DC
  Administered 2022-11-13 – 2022-11-14 (×2): 600 mg via ORAL
  Filled 2022-11-12 (×2): qty 3

## 2022-11-12 MED ORDER — ACETAMINOPHEN 325 MG PO TABS: 650.0000 mg | ORAL_TABLET | ORAL | Status: DC | PRN

## 2022-11-12 MED ORDER — PRAVASTATIN SODIUM 40 MG PO TABS
40.0000 mg | ORAL_TABLET | Freq: Every day | ORAL | Status: DC
  Administered 2022-11-13 – 2022-11-14 (×2): 40 mg via ORAL
  Filled 2022-11-12 (×2): qty 1

## 2022-11-12 MED ORDER — POTASSIUM CHLORIDE CRYS ER 20 MEQ PO TBCR
20.0000 meq | EXTENDED_RELEASE_TABLET | Freq: Every day | ORAL | Status: DC
  Administered 2022-11-13 – 2022-11-14 (×2): 20 meq via ORAL
  Filled 2022-11-12: qty 2
  Filled 2022-11-12 (×2): qty 1

## 2022-11-12 MED ORDER — ACYCLOVIR 400 MG PO TABS
400.0000 mg | ORAL_TABLET | Freq: Two times a day (BID) | ORAL | Status: DC
  Administered 2022-11-12 – 2022-11-14 (×4): 400 mg via ORAL
  Filled 2022-11-12 (×5): qty 1

## 2022-11-12 MED ORDER — FINASTERIDE 5 MG PO TABS
5.0000 mg | ORAL_TABLET | Freq: Every day | ORAL | Status: DC
  Administered 2022-11-13 – 2022-11-14 (×2): 5 mg via ORAL
  Filled 2022-11-12 (×2): qty 1

## 2022-11-12 MED ORDER — GABAPENTIN 100 MG PO CAPS
100.0000 mg | ORAL_CAPSULE | Freq: Three times a day (TID) | ORAL | Status: DC
  Administered 2022-11-12 – 2022-11-14 (×5): 100 mg via ORAL
  Filled 2022-11-12 (×5): qty 1

## 2022-11-12 MED ORDER — FAMOTIDINE 20 MG PO TABS
20.0000 mg | ORAL_TABLET | Freq: Every day | ORAL | Status: DC
  Administered 2022-11-13 – 2022-11-14 (×2): 20 mg via ORAL
  Filled 2022-11-12 (×2): qty 1

## 2022-11-12 MED ORDER — ONDANSETRON HCL 4 MG/2ML IJ SOLN: 4.0000 mg | Freq: Four times a day (QID) | INTRAMUSCULAR | Status: DC | PRN

## 2022-11-12 NOTE — ED Notes (Signed)
Per cardiology, if unable to get tele bed at [facility] then patient is to go ED-ED. Called bed placement and no bed available.  Attempted to call charge at [facility] with no answer.  Camera operator at [facility] long made aware.

## 2022-11-12 NOTE — ED Notes (Signed)
[facility] report given

## 2022-11-12 NOTE — ED Notes (Signed)
[facility] has arrived for patient 

## 2022-11-12 NOTE — Progress Notes (Signed)
[place]  [phone] [phone]     ID: Joe [name] DOB: 1929-09-12  MR#: [id number]  CSN#:[id number]  Patient Care Team: [name], MD as PCP - General (Internal Medicine) Long-Stokes, Adalberto Ill, MD  CHIEF COMPLAINT: multiple myeloma  CURRENT TREATMENT: denosumab Xgeva every 12 weeks; lenalidomide 10 mg 21/7  INTERVAL HISTORY:  Patient is here for follow-up.  He has been doing really well since his last visit. He has been taking aspirin and Revlimid as instructed.  No new bone pains.  No other B symptoms.   He had eye surgery, cataract last week. He had his EKG done at that time as well and was noticed to have bradycardia. He is asymptomatic at this time although he does admit to some light headedness from time to time. No change in breathing or bowel habits or urinary habits.  Rest of the pertinent 10 point ROS reviewed and negative.  Lab Results  Component Value Date   KAPLAMBRATIO 2.19 (H) 09/17/2022   KAPLAMBRATIO 2.25 (H) 07/28/2022   KAPLAMBRATIO 2.40 (H) 06/25/2022   KAPLAMBRATIO 2.41 (H) 05/25/2022   KAPLAMBRATIO 2.19 (H) 03/26/2022    REVIEW OF SYSTEMS:  Rest of the pertinent 10 point ROS reviewed and negative   COVID 19 VACCINATION STATUS: received Baker x2 with booster October 2021.  He received evusheld on 03/20/2021 and is due a second dose   HISTORY OF CURRENT ILLNESS:  From the original intake note:  Joe [name] is an 88 y/o [place] man formerly followed by my partner Dr Julien Nordmann for M-GUS. We have an M-Spike of 0.87 documented 07/12/2012. [name] kappa/lambda light chains and total IgG were follower yearly until 01/2016, when they were 2.36 and 1517 respectively (unremarkable). He was lost to follow-up after that point.  [02/29/2020] the patient presented to Urgent Care with poorly controlled pain. This was felt to be mussculoskeletal and he was started on a medrol dose-pak and robaxin. However  his pain worsened and he presented to the ED at [facility] where vitals and exam were unremarkable but labs showed a creatinine of 1.84 (baseline 1.0), calcium 12.7 with albumin 2.8 and total protein 10.4 (globulin fraction 7.8).  CT of the abdomen obtained for evaluation of his abdominal and back pain showed no hydronephrosis but multiple lytic lesons.   The patient's subsequent history is as detailed below.  PAST MEDICAL HISTORY: Past Medical History:  Diagnosis Date   Enlarged prostate    Hypertension    Melanoma ([place]) 2021    PAST SURGICAL HISTORY: Past Surgical History:  Procedure Laterality Date   EYE SURGERY     PROSTATE SURGERY      FAMILY HISTORY Family History  Problem Relation Age of Onset   Hypertension Mother     SOCIAL HISTORY:  Retired, used to work for the [employer] Lives with wife [place]. Has 4 children, 2 in [place], one in [place], one in [place]; 5 grandchildren and 5 great-grandchildren. Attends a [employer].    ADVANCED DIRECTIVES: at the initial visit the patient confirmed his wife is his HCPOA (despite her memory issues).   HEALTH MAINTENANCE: Social History   Tobacco Use   Smoking status: Never   Smokeless tobacco: Never  Vaping Use   Vaping Use: Never used  Substance Use Topics   Alcohol use: Never   Drug use: Never     Colonoscopy: 6-8 years ago     No Known Allergies  Current Outpatient Medications  Medication Sig Dispense Refill   acyclovir (ZOVIRAX) 400 MG tablet Take 1 tablet (400 mg total) by mouth 2 (two) times daily. 180 tablet 3   amLODipine (NORVASC) 5 MG tablet Take 5 mg by mouth daily.     aspirin EC 81 MG tablet Take 81 mg by mouth daily. Swallow whole.      calcium carbonate (TUMS - DOSED IN MG ELEMENTAL CALCIUM) 500 MG chewable tablet Chew 3 tablets by mouth daily.     doxazosin (CARDURA) 4 MG tablet Take 4 mg by mouth at bedtime.     famotidine (PEPCID) 20 MG tablet Take 1  tablet by mouth once daily 90 tablet 0   finasteride (PROSCAR) 5 MG tablet Take 1 tablet by mouth once daily 30 tablet 6   gabapentin (NEURONTIN) 100 MG capsule Take 1 capsule (100 mg total) by mouth 3 (three) times daily. 90 capsule 4   indapamide (LOZOL) 2.5 MG tablet Take 2.5 mg by mouth daily.      lenalidomide (REVLIMID) 10 MG capsule Take 1 tablet daily for 21 days then 7 days off every 28 days  Celgene BMS Josem Kaufmann [id number] 21 capsule 0   ondansetron (ZOFRAN) 8 MG tablet TAKE 1 TABLET BY MOUTH THREE TIMES DAILY AS NEEDED FOR NAUSEA OR VOMITING 20 tablet 0   polyethylene glycol powder (MIRALAX) 17 GM/SCOOP powder Take 255 g by mouth daily. 255 g 0   potassium chloride (KLOR-CON) 10 MEQ tablet Take 2 tablets by mouth once daily 180 tablet 0   pravastatin (PRAVACHOL) 40 MG tablet Take 40 mg by mouth daily.     prochlorperazine (COMPAZINE) 10 MG tablet TAKE 1 TABLET BY MOUTH EVERY 6 HOURS AS NEEDED FOR NAUSEA AND VOMITING 30 tablet 0   tamsulosin (FLOMAX) 0.4 MG CAPS capsule TAKE 1 CAPSULE BY MOUTH ONCE DAILY AFTER SUPPER 90 capsule 0   traMADol (ULTRAM) 50 MG tablet TAKE 1 TABLET BY MOUTH EVERY 8 HOURS AS NEEDED. 30 tablet 0   No current facility-administered medications for this visit.    OBJECTIVE: African-American man in no acute distress Vitals:   11/12/22 1333  BP: (!) 136/49  Pulse: (!) 35  Resp: 16  Temp: (!) 97.3 F (36.3 C)  SpO2: 100%         Body mass index is 24.56 kg/m.   Wt Readings from Last 3 Encounters:  11/12/22 176 lb 2 oz (79.9 kg)  09/17/22 175 lb 4 oz (79.5 kg)  08/18/22 180 lb 6.4 oz (81.8 kg)  ECOG FS:1 - Symptomatic but completely ambulatory  Physical Exam Constitutional:      Appearance: Normal appearance.  HENT:     Head: Normocephalic and atraumatic.  Cardiovascular:     Rate and Rhythm: Normal rate and regular rhythm.  Abdominal:     General: Abdomen is flat.     Palpations: Abdomen is soft.  Musculoskeletal:        General: No swelling or  tenderness.     Cervical back: Normal range of motion and neck supple.  Neurological:     General: No focal deficit present.     Mental Status: He is alert.  Psychiatric:        Mood and Affect: Mood normal.      LAB RESULTS:  CMP     Component Value Date/Time   NA 139 09/17/2022 1152   NA 140 01/09/2016 1045   K 4.2 09/17/2022 1152   K 3.8 01/09/2016 1045   CL 105 09/17/2022 1152  CO2 28 09/17/2022 1152   CO2 27 01/09/2016 1045   GLUCOSE 81 09/17/2022 1152   GLUCOSE 89 01/09/2016 1045   BUN 18 09/17/2022 1152   BUN 14.7 01/09/2016 1045   CREATININE 1.30 (H) 09/17/2022 1152   CREATININE 1.24 05/25/2022 1254   CREATININE 1.0 01/09/2016 1045   CALCIUM 9.3 09/17/2022 1152   CALCIUM 9.2 01/09/2016 1045   PROT 6.8 09/17/2022 1152   PROT 7.3 01/09/2016 1045   ALBUMIN 3.6 09/17/2022 1152   ALBUMIN 3.7 01/09/2016 1045   AST 14 (L) 09/17/2022 1152   AST 15 05/25/2022 1254   AST 18 01/09/2016 1045   ALT 11 09/17/2022 1152   ALT 14 05/25/2022 1254   ALT 15 01/09/2016 1045   ALKPHOS 52 09/17/2022 1152   ALKPHOS 60 01/09/2016 1045   BILITOT 0.5 09/17/2022 1152   BILITOT 0.6 05/25/2022 1254   BILITOT 0.51 01/09/2016 1045   GFRNONAA 52 (L) 09/17/2022 1152   GFRNONAA 55 (L) 05/25/2022 1254   GFRAA >60 08/30/2020 1402    Lab Results  Component Value Date   TOTALPROTELP 6.4 09/17/2022   ALBUMINELP 3.4 09/17/2022   A1GS 0.2 09/17/2022   A2GS 0.6 09/17/2022   BETS 0.8 09/17/2022   BETA2SER 3.6 07/12/2012   GAMS 1.4 09/17/2022   MSPIKE Not Observed 09/17/2022   SPEI * 07/12/2012     Lab Results  Component Value Date   KPAFRELGTCHN 67.8 (H) 09/17/2022   LAMBDASER 30.9 (H) 09/17/2022   KAPLAMBRATIO 2.19 (H) 09/17/2022    Lab Results  Component Value Date   WBC 3.0 (L) 11/12/2022   NEUTROABS 1.0 (L) 11/12/2022   HGB 9.9 (L) 11/12/2022   HCT 31.5 (L) 11/12/2022   MCV 82.9 11/12/2022   PLT 104 (L) 11/12/2022      Chemistry      Component Value Date/Time    NA 139 09/17/2022 1152   NA 140 01/09/2016 1045   K 4.2 09/17/2022 1152   K 3.8 01/09/2016 1045   CL 105 09/17/2022 1152   CO2 28 09/17/2022 1152   CO2 27 01/09/2016 1045   BUN 18 09/17/2022 1152   BUN 14.7 01/09/2016 1045   CREATININE 1.30 (H) 09/17/2022 1152   CREATININE 1.24 05/25/2022 1254   CREATININE 1.0 01/09/2016 1045      Component Value Date/Time   CALCIUM 9.3 09/17/2022 1152   CALCIUM 9.2 01/09/2016 1045   ALKPHOS 52 09/17/2022 1152   ALKPHOS 60 01/09/2016 1045   AST 14 (L) 09/17/2022 1152   AST 15 05/25/2022 1254   AST 18 01/09/2016 1045   ALT 11 09/17/2022 1152   ALT 14 05/25/2022 1254   ALT 15 01/09/2016 1045   BILITOT 0.5 09/17/2022 1152   BILITOT 0.6 05/25/2022 1254   BILITOT 0.51 01/09/2016 1045       No results found for: "LABCA2"  No components found for: "WRUEAV409"  No results for input(s): "INR" in the last 168 hours.  No results found for: "LABCA2"  No results found for: "WJX914"  No results found for: "CAN125"  No results found for: "CAN153"  No results found for: "CA2729"  No components found for: "HGQUANT"  No results found for: "CEA1", "CEA" / No results found for: "CEA1", "CEA"   No results found for: "AFPTUMOR"  No results found for: "CHROMOGRNA"  No results found for: "HGBA", "HGBA2QUANT", "HGBFQUANT", "HGBSQUAN" (Hemoglobinopathy evaluation)   Lab Results  Component Value Date   LDH 136 01/09/2016    Lab Results  Component  Value Date   IRON 28 (L) 03/26/2022   TIBC 304 03/26/2022   IRONPCTSAT 9 (L) 03/26/2022   (Iron and TIBC)  Lab Results  Component Value Date   FERRITIN 142 05/25/2022    Urinalysis    Component Value Date/Time   COLORURINE AMBER (A) 03/01/2020 1052   APPEARANCEUR CLOUDY (A) 03/01/2020 1052   LABSPEC 1.021 03/01/2020 1052   PHURINE 5.0 03/01/2020 1052   GLUCOSEU NEGATIVE 03/01/2020 1052   HGBUR NEGATIVE 03/01/2020 1052   BILIRUBINUR NEGATIVE 03/01/2020 1052   KETONESUR NEGATIVE  03/01/2020 1052   PROTEINUR 100 (A) 03/01/2020 1052   UROBILINOGEN 0.2 02/29/2020 1537   NITRITE NEGATIVE 03/01/2020 1052   LEUKOCYTESUR NEGATIVE 03/01/2020 1052    STUDIES: No results found.   ELIGIBLE FOR AVAILABLE RESEARCH PROTOCOL:   ASSESSMENT: 88 y.o.  [place] man with a history of M-GUS, presenting 02/29/2020 with a high globulin fraction, worsening renal function, hypercalcemia, anemia and multiple lytic bone lesions.    (1) IgG Kappa Multiple Myeloma: labs diagnostic on 03/01/2020 with Mspike of 3.8, Kappa free light chain 690.6, and ratio 117.05.  (a) CT A/P on 03/01/2020 shows "innumerable" lytic lesions involving all visualized bones  (b) bone marrow biopsy 03/11/2020 shows 20% plasmacytosis by CD138, 12% by manual differential; molecular studies not requested  (c) Bortezomib and Dexamethasone given weekly 3 weeks on and 1 week off beginning 03/11/2020, with good response, last dose 07/24/2020  (d) transitioned to lenalidomide 10 mg daily 21/7 starting 07/31/2020    (2) lytic bone lesions/ hypercalcemia:  (a) Pamidronate administered on 03/01/2020  (b) denosumab/Xgeva started 04/17/2020-- repeat every 12 weeks   (a) to take TUMS 3 tabs on day of Xgeva dose  PLAN:   Mr. Bluemel is doing quite well, continues on oral Revlimid maintenance along with aspirin prophylaxis.    Labs in October without any concern for relapse.  Today he only got labs CBC, CMP, no concerns CMP with mildly elevated creatinine but stable GFR He however was noted to have severe bradycardia on exam today.  He is not symptomatic at this moment however he reports some intermittent episodes of dizziness and lightheadedness.  Since he is pulse is about 35 even on manual exam, we recommended that he does not drive today and go to the emergency room for further evaluation and he is agreeable.  Total time spent: 20 min  *Total Encounter Time as defined by the Centers for Medicare and Medicaid Services  includes, in addition to the face-to-face time of a patient visit (documented in the note above) non-face-to-face time: obtaining and reviewing outside history, ordering and reviewing medications, tests or procedures, care coordination (communications with other health care professionals or caregivers) and documentation in the medical record.

## 2022-11-12 NOTE — ED Provider Notes (Signed)
[place] DEPT Provider Note   CSN: [id number] Arrival date & time: 11/12/22  1409     History  Chief Complaint  Patient presents with   Bradycardia    Joe [name] is a 88 y.o. male.  Patient has a history of hypertension.  He has an occasional dizziness.  He was sent over here from the doctor's office for bradycardia.  The history is provided by the patient and medical records. No language interpreter was used.  Dizziness Quality:  Lightheadedness Severity:  Mild Onset quality:  Gradual Timing:  Intermittent Progression:  Waxing and waning Chronicity:  New Context: not when bending over   Relieved by:  Nothing Worsened by:  Nothing Ineffective treatments:  None tried Associated symptoms: no blood in stool, no chest pain, no diarrhea and no headaches        Home Medications Prior to Admission medications   Medication Sig Start Date End Date Taking? Authorizing Provider  acyclovir (ZOVIRAX) 400 MG tablet Take 1 tablet (400 mg total) by mouth 2 (two) times daily. 09/24/22   Benay Pike, MD  amLODipine (NORVASC) 5 MG tablet Take 5 mg by mouth daily. 01/08/20   [provider]  aspirin EC 81 MG tablet Take 81 mg by mouth daily. Swallow whole.     [provider]  calcium carbonate (TUMS - DOSED IN MG ELEMENTAL CALCIUM) 500 MG chewable tablet Chew 3 tablets by mouth daily.    [provider]  doxazosin (CARDURA) 4 MG tablet Take 4 mg by mouth at bedtime.    [provider]  famotidine (PEPCID) 20 MG tablet Take 1 tablet by mouth once daily 09/29/22   Benay Pike, MD  finasteride (PROSCAR) 5 MG tablet Take 1 tablet by mouth once daily 05/21/22   Benay Pike, MD  gabapentin (NEURONTIN) 100 MG capsule Take 1 capsule (100 mg total) by mouth 3 (three) times daily. 11/06/22   Benay Pike, MD  indapamide (LOZOL) 2.5 MG tablet Take 2.5 mg by mouth daily.     [provider]  lenalidomide  (REVLIMID) 10 MG capsule Take 1 tablet daily for 21 days then 7 days off every 28 days  Celgene BMS Josem Kaufmann [id number] 11/02/22   Benay Pike, MD  ondansetron (ZOFRAN) 8 MG tablet TAKE 1 TABLET BY MOUTH THREE TIMES DAILY AS NEEDED FOR NAUSEA OR VOMITING 01/17/21   Magrinat, Virgie Dad, MD  polyethylene glycol powder (MIRALAX) 17 GM/SCOOP powder Take 255 g by mouth daily. 03/11/20   Gardenia Phlegm, NP  potassium chloride (KLOR-CON) 10 MEQ tablet Take 2 tablets by mouth once daily 08/28/22   Benay Pike, MD  pravastatin (PRAVACHOL) 40 MG tablet Take 40 mg by mouth daily. 01/08/20   [provider]  prochlorperazine (COMPAZINE) 10 MG tablet TAKE 1 TABLET BY MOUTH EVERY 6 HOURS AS NEEDED FOR NAUSEA AND VOMITING 08/25/21   Magrinat, Virgie Dad, MD  tamsulosin (FLOMAX) 0.4 MG CAPS capsule TAKE 1 CAPSULE BY MOUTH ONCE DAILY AFTER SUPPER 08/31/22   Benay Pike, MD  traMADol (ULTRAM) 50 MG tablet TAKE 1 TABLET BY MOUTH EVERY 8 HOURS AS NEEDED. 01/27/21   Magrinat, Virgie Dad, MD      Allergies    Patient has no known allergies.    Review of Systems   Review of Systems  Constitutional:  Negative for appetite change and fatigue.  HENT:  Negative for congestion, ear discharge and sinus pressure.   Eyes:  Negative for discharge.  Respiratory:  Negative  for cough.   Cardiovascular:  Negative for chest pain.  Gastrointestinal:  Negative for abdominal pain, blood in stool and diarrhea.  Genitourinary:  Negative for frequency and hematuria.  Musculoskeletal:  Negative for back pain.  Skin:  Negative for rash.  Neurological:  Positive for dizziness. Negative for seizures and headaches.  Psychiatric/Behavioral:  Negative for hallucinations.     Physical Exam Updated Vital Signs BP (!) 137/51   Pulse (!) 36   Temp 97.7 F (36.5 C) (Oral)   Resp 18   SpO2 99%  Physical Exam Vitals and nursing note reviewed.  Constitutional:      Appearance: He is well-developed.  HENT:     Head:  Normocephalic.     Nose: Nose normal.  Eyes:     General: No scleral icterus.    Conjunctiva/sclera: Conjunctivae normal.  Neck:     Thyroid: No thyromegaly.  Cardiovascular:     Rate and Rhythm: Bradycardia present. Rhythm irregular.     Heart sounds: No murmur heard.    No friction rub. No gallop.  Pulmonary:     Breath sounds: No stridor. No wheezing or rales.  Chest:     Chest wall: No tenderness.  Abdominal:     General: There is no distension.     Tenderness: There is no abdominal tenderness. There is no rebound.  Musculoskeletal:        General: Normal range of motion.     Cervical back: Neck supple.  Lymphadenopathy:     Cervical: No cervical adenopathy.  Skin:    Findings: No erythema or rash.  Neurological:     Mental Status: He is alert and oriented to person, place, and time.     Motor: No abnormal muscle tone.     Coordination: Coordination normal.  Psychiatric:        Behavior: Behavior normal.     ED Results / Procedures / Treatments   Labs (all labs ordered are listed, but only abnormal results are displayed) Labs Reviewed  CBC WITH DIFFERENTIAL/PLATELET - Abnormal; Notable for the following components:      Result Value   WBC 3.2 (*)    RBC 3.87 (*)    Hemoglobin 10.0 (*)    HCT 32.6 (*)    MCH 25.8 (*)    Platelets 113 (*)    Neutro Abs 1.1 (*)    All other components within normal limits  COMPREHENSIVE METABOLIC PANEL - Abnormal; Notable for the following components:   BUN 25 (*)    GFR, Estimated 57 (*)    All other components within normal limits  I-STAT CHEM 8, ED - Abnormal; Notable for the following components:   Creatinine, Ser 1.30 (*)    Hemoglobin 11.9 (*)    HCT 35.0 (*)    All other components within normal limits  CBC WITH DIFFERENTIAL/PLATELET    EKG None  Radiology No results found.  Procedures Procedures    Medications Ordered in ED Medications - No data to display  ED Course/ Medical Decision Making/  A&P CRITICAL CARE Performed by: Milton Ferguson Total critical care time: 45 minutes Critical care time was exclusive of separately billable procedures and treating other patients. Critical care was necessary to treat or prevent imminent or life-threatening deterioration. Critical care was time spent personally by me on the following activities: development of treatment plan with patient and/or surrogate as well as nursing, discussions with consultants, evaluation of patient's response to treatment, examination of patient, obtaining history from  patient or surrogate, ordering and performing treatments and interventions, ordering and review of laboratory studies, ordering and review of radiographic studies, pulse oximetry and re-evaluation of patient's condition.    Patient with Mobitz type II bradycardia.  I spoke with cardiology and he will be transferred over to [facility] for evaluation                         Medical Decision Making Amount and/or Complexity of Data Reviewed Labs: ordered.  Risk Decision regarding hospitalization.    This patient presents to the ED for concern of dizziness, this involves an extensive number of treatment options, and is a complaint that carries with it a high risk of complications and morbidity.  The differential diagnosis includes bradycardia, dehydration   Co morbidities that complicate the patient evaluation  Hypertension   Additional history obtained:  Additional history obtained from patient and primary care doctor External records from outside source obtained and reviewed including hospital records   Lab Tests:  I Ordered, and personally interpreted labs.  The pertinent results include: CBC shows hemoglobin 10 and creatinine 1.3   Imaging Studies ordered:  I ordered imaging studies including chest x-ray I independently visualized and interpreted imaging which showed unremarkable I agree with the radiologist  interpretation   Cardiac Monitoring: / EKG:  The patient was maintained on a cardiac monitor.  I personally viewed and interpreted the cardiac monitored which showed an underlying rhythm of: Mobitz type II bradycardia   Consultations Obtained:  I requested consultation with the cardiology,  and discussed lab and imaging findings as well as pertinent plan - they recommend: Admission to [facility] by cardiology   Problem List / ED Course / Critical interventions / Medication management  Hypertension and bradycardia I ordered medication including saline for possible dehydration Reevaluation of the patient after these medicines showed that the patient stayed the same I have reviewed the patients home medicines and have made adjustments as needed   Social Determinants of Health:  None   Test / Admission - Considered:  No additional tests in the emergency department  Patient with Mobitz 2 bradycardia at a rate of about 37.  Patient is stable with normal blood pressure.  He is being admitted over to [facility] with cardiology admitting the patient        Final Clinical Impression(s) / ED Diagnoses Final diagnoses:  Bradycardia    Rx / DC Orders ED Discharge Orders     None         Milton Ferguson, MD 11/14/22 1032

## 2022-11-12 NOTE — ED Notes (Signed)
[facility] called. 

## 2022-11-12 NOTE — H&P (Addendum)
Cardiology Admission History and Physical   Patient ID: Joe [name] MRN: [id number]; DOB: 1929/05/28   Admission date: 11/12/2022  PCP:  [name], [place] Providers Cardiologist:  None        Chief Complaint: low heart rate  Patient Profile:   Joe [name] is a 88 y.o. male with HTN, enlarged prostate, multiple myeloma, probable CKD stage 3a by labs who is being seen 11/12/2022 for the evaluation of bradycardia with 2:1 AVB.  History of Present Illness:   Mr. Flink is a delightful 88 y/o M who lives at home with his wife of 44 years. He ambulates without the use of an assistive device and denies any prior cardiac hx. He presented to the cancer center today for routine f/u of his multiple myeloma and was found to have HR in the 30s with EKG showing 2:1 AVB. The patient recalls previously being told he had a low HR but never this low. He reports some minor fatigue and episodic dizziness but nothing acute or new. No CP or syncope. Labs showed K 3.9, Cr 1.21, normal LFTs, Hgb 10.0, plt 113. He was transferred to [facility] for EP evaluation tomorrow and possible pacemaker. The patient is seen on arrival and remains hemodynamically stable, trending mildly hypertensive, with intermittent SB and 2:1 AVB. No prolonged pauses on telemetry, just HR 30s-40s at times. He denies any acute complaints and is agreeable to the idea of PPM if needed. He does report he took all of his AM meds this morning.   Past Medical History:  Diagnosis Date   Enlarged prostate    Hypertension    Melanoma ([place]) 2021   Multiple myeloma ([place])     Past Surgical History:  Procedure Laterality Date   EYE SURGERY     PROSTATE SURGERY       Medications Prior to Admission: Prior to Admission medications   Medication Sig Start Date End Date Taking? Authorizing Provider  acyclovir (ZOVIRAX) 400 MG tablet Take 1 tablet (400 mg total) by mouth 2 (two) times daily. 09/24/22   Benay Pike, MD  amLODipine (NORVASC) 5 MG tablet Take 5 mg by mouth daily. 01/08/20   [provider]  aspirin EC 81 MG tablet Take 81 mg by mouth daily. Swallow whole.     [provider]  calcium carbonate (TUMS - DOSED IN MG ELEMENTAL CALCIUM) 500 MG chewable tablet Chew 3 tablets by mouth daily.    [provider]  doxazosin (CARDURA) 4 MG tablet Take 4 mg by mouth at bedtime.    [provider]  famotidine (PEPCID) 20 MG tablet Take 1 tablet by mouth once daily 09/29/22   Benay Pike, MD  finasteride (PROSCAR) 5 MG tablet Take 1 tablet by mouth once daily 05/21/22   Benay Pike, MD  gabapentin (NEURONTIN) 100 MG capsule Take 1 capsule (100 mg total) by mouth 3 (three) times daily. 11/06/22   Benay Pike, MD  indapamide (LOZOL) 2.5 MG tablet Take 2.5 mg by mouth daily.     [provider]  lenalidomide (REVLIMID) 10 MG capsule Take 1 tablet daily for 21 days then 7 days off every 28 days  Celgene BMS Josem Kaufmann [id number] 11/02/22   Benay Pike, MD  ondansetron (ZOFRAN) 8 MG tablet TAKE 1 TABLET BY MOUTH THREE TIMES DAILY AS NEEDED FOR NAUSEA OR VOMITING 01/17/21   Magrinat, Virgie Dad, MD  polyethylene glycol powder (MIRALAX) 17 GM/SCOOP powder Take 255 g by mouth daily.  03/11/20   Gardenia Phlegm, NP  potassium chloride (KLOR-CON) 10 MEQ tablet Take 2 tablets by mouth once daily 08/28/22   Benay Pike, MD  pravastatin (PRAVACHOL) 40 MG tablet Take 40 mg by mouth daily. 01/08/20   [provider]  prochlorperazine (COMPAZINE) 10 MG tablet TAKE 1 TABLET BY MOUTH EVERY 6 HOURS AS NEEDED FOR NAUSEA AND VOMITING 08/25/21   Magrinat, Virgie Dad, MD  tamsulosin (FLOMAX) 0.4 MG CAPS capsule TAKE 1 CAPSULE BY MOUTH ONCE DAILY AFTER SUPPER 08/31/22   Iruku, Arletha Pili, MD  traMADol (ULTRAM) 50 MG tablet TAKE 1 TABLET BY MOUTH EVERY 8 HOURS AS NEEDED. 01/27/21   Magrinat, Virgie Dad, MD     Allergies:   No Known Allergies  Social History:   Social  History   Socioeconomic History   Marital status: Married    Spouse name: Not on file   Number of children: Not on file   Years of education: Not on file   Highest education level: Not on file  Occupational History   Not on file  Tobacco Use   Smoking status: Never   Smokeless tobacco: Never  Vaping Use   Vaping Use: Never used  Substance and Sexual Activity   Alcohol use: Never   Drug use: Never   Sexual activity: Not on file  Other Topics Concern   Not on file  Social History Narrative   Not on file   Social Determinants of Health   Financial Resource Strain: Not on file  Food Insecurity: No Food Insecurity (09/28/2022)   Hunger Vital Sign    Worried About Running Out of Food in the Last Year: Never true    Ran Out of Food in the Last Year: Never true  Transportation Needs: No Transportation Needs (09/28/2022)   PRAPARE - Hydrologist (Medical): No    Lack of Transportation (Non-Medical): No  Physical Activity: Not on file  Stress: Not on file  Social Connections: Not on file  Intimate Partner Violence: Not on file    Family History:  The patient's family history includes Hypertension in his mother.    ROS:  Please see the history of present illness.  All other ROS reviewed and negative.     Physical Exam/Data:   Vitals:   11/12/22 1435 11/12/22 1445 11/12/22 1500 11/12/22 1659  BP: (!) 150/64 (!) 143/49 (!) 137/51 (!) 166/60  Pulse: (!) 35 (!) 37 (!) 36 (!) 47  Resp: _0 Temp:    97.8 F (36.6 C)  TempSrc:    Oral  SpO2: 100% 99% 99% 97%   No intake or output data in the 24 hours ending 11/12/22 1812    11/12/2022    1:33 PM 09/17/2022   12:28 PM 08/18/2022    1:06 PM  Last 3 Weights  Weight (lbs) 176 lb 2 oz 175 lb 4 oz 180 lb 6.4 oz  Weight (kg) 79.89 kg 79.493 kg 81.829 kg     There is no height or weight on file to calculate BMI.  General: Well developed, well nourished, in no acute distress. Head:  Normocephalic, atraumatic, sclera non-icteric, no xanthomas, nares are without discharge. Neck: Negative for carotid bruits. JVP not elevated. Lungs: Clear bilaterally to auscultation without wheezes, rales, or rhonchi. Breathing is unlabored. Heart: Irregular, bradycardic, S1 S2 without murmurs, rubs, or gallops.  Abdomen: Soft, non-tender, non-distended with normoactive bowel sounds. No rebound/guarding. Extremities: No clubbing or cyanosis. No edema.  Distal pedal pulses are 2+ and equal bilaterally. Neuro: Alert and oriented X 3. Moves all extremities spontaneously. Psych:  Responds to questions appropriately with a normal affect.   EKG:  The ECG that was done today was personally reviewed and demonstrates SB 39bpm with 2:1 AVB without acute STTW changes  Relevant CV Studies: None  Laboratory Data:  High Sensitivity Troponin:  No results for input(s): "TROPONINIHS" in the last 720 hours.    Chemistry Recent Labs  Lab 11/12/22 1309 11/12/22 1424 11/12/22 1449  NA 140 140 140  K 4.0 3.9 3.9  CL 106 108 102  CO2 29 27  --   GLUCOSE 76 97 93  BUN 21 25* 22  CREATININE 1.27* 1.21 1.30*  CALCIUM 9.9 9.3  --   GFRNONAA 54* 57*  --   ANIONGAP 5 5  --     Recent Labs  Lab 11/12/22 1309 11/12/22 1424  PROT 6.8 7.2  ALBUMIN 3.9 3.8  AST 16 19  ALT 16 18  ALKPHOS 47 42  BILITOT 0.6 0.8   Lipids No results for input(s): "CHOL", "TRIG", "HDL", "LABVLDL", "LDLCALC", "CHOLHDL" in the last 168 hours. Hematology Recent Labs  Lab 11/12/22 1309 11/12/22 1424 11/12/22 1449  WBC 3.0* 3.2*  --   RBC 3.80* 3.87*  --   HGB 9.9* 10.0* 11.9*  HCT 31.5* 32.6* 35.0*  MCV 82.9 84.2  --   MCH 26.1 25.8*  --   MCHC 31.4 30.7  --   RDW 15.1 15.0  --   PLT 104* 113*  --    Thyroid No results for input(s): "TSH", "FREET4" in the last 168 hours. BNPNo results for input(s): "BNP", "PROBNP" in the last 168 hours.  DDimer No results for input(s): "DDIMER" in the last 168  hours.   Radiology/Studies:  No results found.   Assessment and Plan:   1. Bradycardia with intermittent 2:1 AVB - no clear reversible causes - K normal, not on any AVN blocking agents - will get baseline TSH, Mg, CXR - please review in AM - check echo - ordered pacer to bedside with pacer pads in place - bedrest as a precaution - EKG in AM - hold ASA, hold pharmacologic DVT ppx in case more urgent intervention needed - currently hemodynamically stable so OK to eat but will keep NPO after midnight for EP eval in AM - suspect they will agree he needs PPM  2. Essential HTN - took all his usual meds today already but will hold antihypertensives to allow permissive HTN given #1 - recommend EP team weigh in on pacer plan in AM and guide resumption of regimen  3. Enlarged prostate - continue finasteride - both doxazosin and tamsulosin are listed on med list - will hold given bradycardia but please review pharm reconciliation in AM to decide which to restart  4. Multiple myeloma - Hgb around 10, WBC 3.2, plt 113 similar to prior values, continue OP f/u with heme-onc - last Tramadol fill was in 12/2020 so did not re-order, no active complaints of pain, but is on gabapentin chronically so will continue to avoid w/d  5. CKD stage 3a by labs - trend BMET daily - he confirms he takes KCl 79mq daily - given K 3.9 will continue but recommend attention to this level in AM in case his kidney function changes  DVT ppx: will place SCDs for now in lieu of pharmacologic prophylaxis in case urgent intervention needed - please review in AM if PPM  delayed for any reason  Code status: patient confirms verbally today he is full code   Risk Assessment/Risk Scores:   Severity of Illness: The appropriate patient status for this patient is INPATIENT. Inpatient status is judged to be reasonable and necessary in order to provide the required intensity of service to ensure the patient's safety. The  patient's presenting symptoms, physical exam findings, and initial radiographic and laboratory data in the context of their chronic comorbidities is felt to place them at high risk for further clinical deterioration. Furthermore, it is not anticipated that the patient will be medically stable for discharge from the hospital within 2 midnights of admission.   * I certify that at the point of admission it is my clinical judgment that the patient will require inpatient hospital care spanning beyond 2 midnights from the point of admission due to high intensity of service, high risk for further deterioration and high frequency of surveillance required.*   For questions or updates, please contact [place] Please consult [email or web address] for contact info under   Signed, Charlie Pitter, PA-C  11/12/2022 6:12 PM   Patient seen and examined, note reviewed with the signed Advanced Practice Provider. I personally reviewed laboratory data, imaging studies and relevant notes. I independently examined the patient and formulated the important aspects of the plan. I have personally discussed the plan with the patient and/or family. Comments or changes to the note/plan are indicated below.  Patient seen and examined at his bedside. His granddaughter was present during my encounter. Found to be symptomatic with dizziness in 2:1 AV conduction . Admitted from [facility] ED.  Pacemaker indicated. Spoke with patient - all of his questions have been answered. Plan for EO evaluation in the morning.  Berniece Salines DO, MS [facility] Attending Cardiologist [place]  [address] [place], [place] [address] [phone] Website: [email or web address]

## 2022-11-12 NOTE — ED Notes (Signed)
ED Provider at bedside. 

## 2022-11-12 NOTE — ED Triage Notes (Signed)
Pt reports low heart rate and felt dizzy. Pt HR noted to be 35 in triage.

## 2022-11-13 ENCOUNTER — Encounter (HOSPITAL_COMMUNITY)
Admission: EM | Disposition: A | Discharge status: Home / Self Care | Payer: Self-pay | Source: Home / Self Care | Attending: Cardiology

## 2022-11-13 ENCOUNTER — Inpatient Hospital Stay (HOSPITAL_COMMUNITY): Payer: Medicare Other

## 2022-11-13 DIAGNOSIS — R001 Bradycardia, unspecified: Secondary | ICD-10-CM | POA: Diagnosis not present

## 2022-11-13 HISTORY — PX: PACEMAKER IMPLANT: EP1218

## 2022-11-13 LAB — BASIC METABOLIC PANEL
Anion gap: 6 (ref 5–15)
BUN: 23 mg/dL (ref 8–23)
CO2: 25 mmol/L (ref 22–32)
Calcium: 9 mg/dL (ref 8.9–10.3)
Chloride: 107 mmol/L (ref 98–111)
Creatinine, Ser: 1.25 mg/dL — ABNORMAL HIGH (ref 0.61–1.24)
GFR, Estimated: 55 mL/min — ABNORMAL LOW (ref >60)
Glucose, Bld: 83 mg/dL (ref 70–99)
Potassium: 4 mmol/L (ref 3.5–5.1)
Sodium: 138 mmol/L (ref 135–145)

## 2022-11-13 LAB — ECHOCARDIOGRAM COMPLETE
Area-P 1/2: 1.44 cm2
Calc EF: 67.4 %
MV VTI: 1.67 cm2
S' Lateral: 3.3 cm
Single Plane A2C EF: 73.6 %
Single Plane A4C EF: 61.9 %
Weight: 2818 oz

## 2022-11-13 LAB — SURGICAL PCR SCREEN
MRSA, PCR: NEGATIVE
Staphylococcus aureus: POSITIVE — AB

## 2022-11-13 LAB — CBC
HCT: 28.5 % — ABNORMAL LOW (ref 39.0–52.0)
Hemoglobin: 9 g/dL — ABNORMAL LOW (ref 13.0–17.0)
MCH: 25.9 pg — ABNORMAL LOW (ref 26.0–34.0)
MCHC: 31.6 g/dL (ref 30.0–36.0)
MCV: 81.9 fL (ref 80.0–100.0)
Platelets: 101 10*3/uL — ABNORMAL LOW (ref 150–400)
RBC: 3.48 MIL/uL — ABNORMAL LOW (ref 4.22–5.81)
RDW: 14.8 % (ref 11.5–15.5)
WBC: 2.6 10*3/uL — ABNORMAL LOW (ref 4.0–10.5)
nRBC: 0 % (ref 0.0–0.2)

## 2022-11-13 LAB — MAGNESIUM: Magnesium: 1.5 mg/dL — ABNORMAL LOW (ref 1.7–2.4)

## 2022-11-13 SURGERY — PACEMAKER IMPLANT

## 2022-11-13 MED ORDER — SODIUM CHLORIDE 0.9 % IV SOLN: INTRAVENOUS | Status: DC

## 2022-11-13 MED ORDER — CHLORHEXIDINE GLUCONATE 4 % EX LIQD
60.0000 mL | Freq: Once | CUTANEOUS | Status: AC
  Administered 2022-11-13: 4 via TOPICAL
  Filled 2022-11-13: qty 60

## 2022-11-13 MED ORDER — IOHEXOL 350 MG/ML SOLN
INTRAVENOUS | Status: DC | PRN
  Administered 2022-11-13: 10 mL

## 2022-11-13 MED ORDER — LIDOCAINE HCL 1 % IJ SOLN
INTRAMUSCULAR | Status: AC
  Filled 2022-11-13: qty 60

## 2022-11-13 MED ORDER — MAGNESIUM SULFATE 4 GM/100ML IV SOLN
4.0000 g | Freq: Once | INTRAVENOUS | Status: AC
  Administered 2022-11-13: 4 g via INTRAVENOUS
  Filled 2022-11-13: qty 100

## 2022-11-13 MED ORDER — CEFAZOLIN SODIUM-DEXTROSE 2-4 GM/100ML-% IV SOLN
2.0000 g | INTRAVENOUS | Status: AC
  Administered 2022-11-13: 2 g via INTRAVENOUS
  Filled 2022-11-13: qty 100

## 2022-11-13 MED ORDER — HEPARIN (PORCINE) IN NACL 1000-0.9 UT/500ML-% IV SOLN
INTRAVENOUS | Status: AC
  Filled 2022-11-13: qty 500

## 2022-11-13 MED ORDER — HEPARIN (PORCINE) IN NACL 1000-0.9 UT/500ML-% IV SOLN
INTRAVENOUS | Status: DC | PRN
  Administered 2022-11-13: 500 mL

## 2022-11-13 MED ORDER — LENALIDOMIDE 15 MG PO CAPS: 15.0000 mg | ORAL_CAPSULE | Freq: Every day | ORAL | Status: DC

## 2022-11-13 MED ORDER — CEFAZOLIN SODIUM-DEXTROSE 2-4 GM/100ML-% IV SOLN
INTRAVENOUS | Status: AC
  Filled 2022-11-13: qty 100

## 2022-11-13 MED ORDER — LIDOCAINE HCL (PF) 1 % IJ SOLN
INTRAMUSCULAR | Status: DC | PRN
  Administered 2022-11-13: 60 mL

## 2022-11-13 MED ORDER — CHLORHEXIDINE GLUCONATE CLOTH 2 % EX PADS
6.0000 | MEDICATED_PAD | Freq: Every day | CUTANEOUS | Status: DC
  Administered 2022-11-14: 6 via TOPICAL

## 2022-11-13 MED ORDER — CEFAZOLIN SODIUM-DEXTROSE 1-4 GM/50ML-% IV SOLN
1.0000 g | Freq: Four times a day (QID) | INTRAVENOUS | Status: AC
  Administered 2022-11-14 (×3): 1 g via INTRAVENOUS
  Filled 2022-11-13 (×5): qty 50

## 2022-11-13 MED ORDER — SODIUM CHLORIDE 0.9 % IV SOLN
80.0000 mg | INTRAVENOUS | Status: AC
  Administered 2022-11-13: 80 mg
  Filled 2022-11-13: qty 2

## 2022-11-13 MED ORDER — CHLORHEXIDINE GLUCONATE 4 % EX LIQD
60.0000 mL | Freq: Once | CUTANEOUS | Status: DC
  Filled 2022-11-13: qty 60

## 2022-11-13 MED ORDER — MUPIROCIN 2 % EX OINT
1.0000 | TOPICAL_OINTMENT | Freq: Two times a day (BID) | CUTANEOUS | Status: DC
  Administered 2022-11-14 (×2): 1 via NASAL
  Filled 2022-11-13 (×3): qty 22

## 2022-11-13 MED ORDER — SODIUM CHLORIDE 0.9 % IV SOLN
INTRAVENOUS | Status: AC | PRN
  Administered 2022-11-13: 200 mL/h via INTRAVENOUS

## 2022-11-13 MED ORDER — LENALIDOMIDE 10 MG PO CAPS
10.0000 mg | ORAL_CAPSULE | Freq: Every day | ORAL | Status: DC
  Administered 2022-11-14: 10 mg via ORAL
  Filled 2022-11-13 (×2): qty 1

## 2022-11-13 MED ORDER — ACETAMINOPHEN 325 MG PO TABS
325.0000 mg | ORAL_TABLET | ORAL | Status: DC | PRN
  Administered 2022-11-13: 650 mg via ORAL
  Filled 2022-11-13: qty 2

## 2022-11-13 MED ORDER — ONDANSETRON HCL 4 MG/2ML IJ SOLN: 4.0000 mg | Freq: Four times a day (QID) | INTRAMUSCULAR | Status: DC | PRN

## 2022-11-13 MED ORDER — SODIUM CHLORIDE 0.9 % IV SOLN
INTRAVENOUS | Status: AC
  Filled 2022-11-13: qty 2

## 2022-11-13 SURGICAL SUPPLY — 9 items
CABLE SURGICAL S-101-97-12 (CABLE) ×1 IMPLANT
HEMOSTAT SURGICEL 2X4 FIBR (HEMOSTASIS) IMPLANT
KIT MICROPUNCTURE NIT STIFF (SHEATH) IMPLANT
LEAD ISOFLEX OPT 1944-52CM (Lead) IMPLANT
LEAD ISOFLEX OPT 1948-58CM (Lead) IMPLANT
PACEMAKER ASSURITY DR-RF (Pacemaker) IMPLANT
PAD DEFIB RADIO PHYSIO CONN (PAD) ×1 IMPLANT
SHEATH 7FR PRELUDE SNAP 13 (SHEATH) IMPLANT
TRAY PACEMAKER INSERTION (PACKS) ×1 IMPLANT

## 2022-11-13 NOTE — Progress Notes (Signed)
Pt received from cath lab AxOx4, VS wnL and as per flow. Left upper chest with surgi-glue closure C/D/I. Paced 60 bpm on telemetry. Sequence vital signs started. Family bedside. All questions and concerns addressed. Call bell placed within reach, will continue to monitor and maintain safety.

## 2022-11-13 NOTE — Progress Notes (Signed)
  Echocardiogram 2D Echocardiogram has been performed.  Joe [name] 11/13/2022, 2:17 PM

## 2022-11-13 NOTE — Progress Notes (Signed)
Mg 1.5. D/w EP -> will order mag sulfate 4g.

## 2022-11-13 NOTE — Discharge Instructions (Signed)
After Your Pacemaker   ACTIVITY Do not lift your arm above shoulder height for 1 week after your procedure. After 7 days, you may progress as below.  You should remove your sling 24 hours after your procedure, unless otherwise instructed by your provider.     Friday November 20, 2022  Saturday November 21, 2022 Sunday November 22, 2022 Monday November 23, 2022   Do not lift, push, pull, or carry anything over 10 pounds with the affected arm until 6 weeks (Friday December 25, 2022 ) after your procedure.   You may drive AFTER your wound check, unless you have been told otherwise by your provider.   Ask your healthcare provider when you can go back to work   INCISION/Dressing If you are on a blood thinner such as Coumadin, Xarelto, Eliquis, Plavix, or Pradaxa please confirm with your provider when this should be resumed.   If large square, outer bandage is left in place, this can be removed after 24 hours from your procedure. Do not remove steri-strips or glue as below.   Monitor your Pacemaker site for redness, swelling, and drainage. Call the device clinic at [phone] if you experience these symptoms or fever/chills.  If your incision is closed with Dermabond/Surgical glue. You may shower 1 day after your pacemaker implant and wash around the site with soap and water. If your incision is closed with steri-strips, you must way 7 days before you can shower.     If you were discharged in a sling, please do not wear this during the day more than 48 hours after your surgery unless otherwise instructed. This may increase the risk of stiffness and soreness in your shoulder.   Avoid lotions, ointments, or perfumes over your incision until it is well-healed.  You may use a hot tub or a pool AFTER your wound check appointment if the incision is completely closed.  Pacemaker Alerts:  Some alerts are vibratory and others beep. These are NOT emergencies. Please call our office to let us know.  If this occurs at night or on weekends, it can wait until the next business day. Send a remote transmission.  If your device is capable of reading fluid status (for heart failure), you will be offered monthly monitoring to review this with you.   DEVICE MANAGEMENT Remote monitoring is used to monitor your pacemaker from home. This monitoring is scheduled every 91 days by our office. It allows [place] to keep an eye on the functioning of your device to ensure it is working properly. You will routinely see your Electrophysiologist annually (more often if necessary).   You should receive your ID card for your new device in 4-8 weeks. Keep this card with you at all times once received. Consider wearing a medical alert bracelet or necklace.  Your Pacemaker may be MRI compatible. This will be discussed at your next office visit/wound check.  You should avoid contact with strong electric or magnetic [place].   Do not use amateur (ham) radio equipment or electric (arc) welding torches. MP3 player headphones with magnets should not be used. Some devices are safe to use if held at least 12 inches (30 cm) from your Pacemaker. These include power tools, lawn mowers, and speakers. If you are unsure if something is safe to use, ask your health care provider.  When using your cell phone, hold it to the ear that is on the opposite side from the Pacemaker. Do not leave your cell phone in a pocket  over the Pacemaker.  You may safely use electric blankets, heating pads, computers, and microwave ovens.  Call the office right away if: You have chest pain. You feel more short of breath than you have felt before. You feel more light-headed than you have felt before. Your incision starts to open up.  This information is not intended to replace advice given to you by your health care provider. Make sure you discuss any questions you have with your health care provider.

## 2022-11-13 NOTE — Consult Note (Addendum)
 ELECTROPHYSIOLOGY CONSULT NOTE    Patient ID: Joe [name] MRN: [id number], DOB/AGE: 86/10/1929 86 y.o.  Admit date: 11/12/2022 Date of Consult: 11/13/2022  Primary Physician: Ramachandran, Ajith, MD Primary Cardiologist: None  Electrophysiologist: New   Referring Provider: Dr. Tobb  Patient Profile: Joe [name] is a 88 y.o. male with a history of HTN, enlarged prostate, multiple myeloma, CKD III who is being seen today for the evaluation of symptomatic bradycardia at the request of Dr. Tobb.  HPI:  Joe [name] is a 88 y.o. male with medical history as above.   Pt seen by PCP/Cancer Center 12/14 and found to be in 2:1 AVB in the 30s with symptoms of fatigue and episodic dizziness.  Denies CP or syncope. No AV nodal agents.Transferred to [facility] for pacemaker consideration.  Potassium4.0 (12/15 0214) Magnesium  1.5* (12/15 0251) Creatinine, ser  1.25* (12/15 0214) PLT  101* (12/15 0214) HGB  9.0* (12/15 0214) WBC 2.6* (12/15 0214)  This am pt is feeling OK. Denies h/o syncope. Does have fatigue and dizziness as above. Labs are near baseline. Understands that his symptoms are likely coming from his bradycardia.    Past Medical History:  Diagnosis Date   Enlarged prostate    Hypertension    Melanoma ([facility]) 2021   Multiple myeloma ([facility])      Surgical History:  Past Surgical History:  Procedure Laterality Date   EYE SURGERY     PROSTATE SURGERY       Medications Prior to Admission  Medication Sig Dispense Refill Last Dose   acyclovir (ZOVIRAX) 400 MG tablet Take 1 tablet (400 mg total) by mouth 2 (two) times daily. 180 tablet 3 11/12/2022   allopurinol (ZYLOPRIM) 100 MG tablet Take 100 mg by mouth daily.   11/12/2022   amLODipine (NORVASC) 5 MG tablet Take 5 mg by mouth daily.   11/12/2022   aspirin EC 81 MG tablet Take 81 mg by mouth daily. Swallow whole.    11/12/2022   calcium carbonate (TUMS - DOSED IN MG ELEMENTAL CALCIUM) 500 MG chewable tablet Chew 3 tablets  by mouth daily.   unk   doxazosin (CARDURA) 4 MG tablet Take 4 mg by mouth at bedtime.   11/11/2022   famotidine (PEPCID) 20 MG tablet Take 1 tablet by mouth once daily 90 tablet 0 11/12/2022   finasteride (PROSCAR) 5 MG tablet Take 1 tablet by mouth once daily 30 tablet 6 11/12/2022   fosinopril (MONOPRIL) 40 MG tablet Take 40 mg by mouth daily.   11/12/2022   gabapentin (NEURONTIN) 100 MG capsule Take 1 capsule (100 mg total) by mouth 3 (three) times daily. 90 capsule 4 11/12/2022   indapamide (LOZOL) 2.5 MG tablet Take 2.5 mg by mouth daily.    11/12/2022   lenalidomide (REVLIMID) 10 MG capsule Take 1 tablet daily for 21 days then 7 days off every 28 days  Celgene BMS auth [id number] 21 capsule 0 unk   ondansetron (ZOFRAN) 8 MG tablet TAKE 1 TABLET BY MOUTH THREE TIMES DAILY AS NEEDED FOR NAUSEA OR VOMITING (Patient taking differently: Take 8 mg by mouth every 8 (eight) hours as needed for nausea or vomiting.) 20 tablet 0 unk   polyethylene glycol powder (MIRALAX) 17 GM/SCOOP powder Take 255 g by mouth daily. 255 g 0 unk   potassium chloride (KLOR-CON) 10 MEQ tablet Take 2 tablets by mouth once daily 180 tablet 0 11/12/2022   pravastatin (PRAVACHOL) 40 MG tablet Take 40 mg by mouth daily.   11/12/2022     tamsulosin (FLOMAX) 0.4 MG CAPS capsule TAKE 1 CAPSULE BY MOUTH ONCE DAILY AFTER SUPPER (Patient taking differently: Take 0.4 mg by mouth daily after supper.) 90 capsule 0 unk    Inpatient Medications:   acyclovir  400 mg Oral BID   calcium carbonate  3 tablet Oral Daily   famotidine  20 mg Oral Daily   finasteride  5 mg Oral Daily   gabapentin  100 mg Oral TID   potassium chloride SA  20 mEq Oral Daily   pravastatin  40 mg Oral Daily    Allergies: No Known Allergies  Social History   Socioeconomic History   Marital status: Married    Spouse name: Not on file   Number of children: Not on file   Years of education: Not on file   Highest education level: Not on file  Occupational  History   Not on file  Tobacco Use   Smoking status: Never   Smokeless tobacco: Never  Vaping Use   Vaping Use: Never used  Substance and Sexual Activity   Alcohol use: Never   Drug use: Never   Sexual activity: Not on file  Other Topics Concern   Not on file  Social History Narrative   Not on file   Social Determinants of Health   Financial Resource Strain: Not on file  Food Insecurity: No Food Insecurity (09/28/2022)   Hunger Vital Sign    Worried About Running Out of Food in the Last Year: Never true    Ran Out of Food in the Last Year: Never true  Transportation Needs: No Transportation Needs (09/28/2022)   PRAPARE - Hydrologist (Medical): No    Lack of Transportation (Non-Medical): No  Physical Activity: Not on file  Stress: Not on file  Social Connections: Not on file  Intimate Partner Violence: Not on file     Family History  Problem Relation Age of Onset   Hypertension Mother      Review of Systems: All other systems reviewed and are otherwise negative except as noted above.  Physical Exam: Vitals:   11/12/22 2348 11/13/22 0541 11/13/22 0542 11/13/22 0736  BP: (!) 131/48 (!) 147/50 (!) 147/50 129/62  Pulse: (!) 50   (!) 41  Resp:   17 18  Temp: 97.8 F (36.6 C) 98 F (36.7 C) 98 F (36.7 C) 97.9 F (36.6 C)  TempSrc: Oral Oral  Oral  SpO2: 98% 97% 97% 100%    GEN- The patient is well appearing, alert and oriented x 3 today.   HEENT: normocephalic, atraumatic; sclera clear, conjunctiva pink; hearing intact; oropharynx clear; neck supple Lungs- Clear to ausculation bilaterally, normal work of breathing.  No wheezes, rales, rhonchi Heart- Regular rate and rhythm, no murmurs, rubs or gallops GI- soft, non-tender, non-distended, bowel sounds present Extremities- no clubbing, cyanosis, or edema; DP/PT/radial pulses 2+ bilaterally MS- no significant deformity or atrophy Skin- warm and dry, no rash or lesion Psych- euthymic  mood, full affect Neuro- strength and sensation are intact  Labs:   Lab Results  Component Value Date   WBC 2.6 (L) 11/13/2022   HGB 9.0 (L) 11/13/2022   HCT 28.5 (L) 11/13/2022   MCV 81.9 11/13/2022   PLT 101 (L) 11/13/2022    Recent Labs  Lab 11/12/22 1424 11/12/22 1449 11/13/22 0214  NA 140   < > 138  K 3.9   < > 4.0  CL 108   < > 107  CO2  27  --  25  BUN 25*   < > 23  CREATININE 1.21   < > 1.25*  CALCIUM 9.3  --  9.0  PROT 7.2  --   --   BILITOT 0.8  --   --   ALKPHOS 42  --   --   ALT 18  --   --   AST 19  --   --   GLUCOSE 97   < > 83   < > = values in this interval not displayed.      Radiology/Studies: DG Chest 2 View  Result Date: 11/12/2022 CLINICAL DATA:  Bradycardia EXAM: CHEST - 2 VIEW COMPARISON:  07/31/2020 FINDINGS: Cardiac shadow is within normal limits. Elevation of the left hemidiaphragm is noted. No focal infiltrate is seen. Minimal scarring is noted in the left base. No acute bony abnormality is noted. Compression deformity at the thoracolumbar junction is again noted and stable. IMPRESSION: No acute abnormality noted. Electronically Signed   By: Inez Catalina M.D.   On: 11/12/2022 20:45    EKG:on arrival shows 2:1 AVB in 30s (personally reviewed)  TELEMETRY: Second degree AV block 30-40s with Mobitz 1 and 2 (personally reviewed)  Assessment/Plan: 1.  Symptomatic bradycardia Without reversible cause Echo pending today Avoid AV nodal agents Explained risks, benefits, and alternatives to PPM implantation, including but not limited to bleeding, infection, pneumothorax, pericardial effusion, lead dislodgement, heart attack, stroke, or death.  Pt verbalized understanding and agrees to proceed.  2. HTN Hold meds for now, can resume post pacing.   3. Multiple myeloma Does not have a port.  Labs not far from baseline.   For questions or updates, please contact [place] Please consult [email or web address] for contact info under  Cardiology/STEMI.  Signed, Shirley Friar, PA-C  11/13/2022 8:38 AM  Complete haert block, relatively narrow QRS withsome competitive wide complex beats  Myeloma  HTN  CKD 3b Estimated Creatinine Clearance: 41.8 mL/min (A) (by C-G formula based on SCr of 1.25 mg/dL (H)).  LV function normal   Complete heart block without reversible cause with some fatigue and LH over recent weeks-- long standing brad with HR 50-60s but now CHB, no prior tracings in our system for review  For pacing for relief of symptoms  The benefits and risks were reviewed including but not limited to death,  perforation, infection, lead dislodgement and device malfunction.  The patient understands agrees and is willing to proceed.

## 2022-11-14 ENCOUNTER — Inpatient Hospital Stay (HOSPITAL_COMMUNITY): Payer: Medicare Other

## 2022-11-14 ENCOUNTER — Other Ambulatory Visit: Payer: Self-pay | Admitting: Physician Assistant

## 2022-11-14 DIAGNOSIS — Z950 Presence of cardiac pacemaker: Secondary | ICD-10-CM | POA: Insufficient documentation

## 2022-11-14 LAB — BASIC METABOLIC PANEL
Anion gap: 5 (ref 5–15)
BUN: 18 mg/dL (ref 8–23)
CO2: 27 mmol/L (ref 22–32)
Calcium: 8.9 mg/dL (ref 8.9–10.3)
Chloride: 105 mmol/L (ref 98–111)
Creatinine, Ser: 1.31 mg/dL — ABNORMAL HIGH (ref 0.61–1.24)
GFR, Estimated: 52 mL/min — ABNORMAL LOW (ref >60)
Glucose, Bld: 111 mg/dL — ABNORMAL HIGH (ref 70–99)
Potassium: 3.7 mmol/L (ref 3.5–5.1)
Sodium: 137 mmol/L (ref 135–145)

## 2022-11-14 MED ORDER — MAGNESIUM OXIDE 400 MG PO CAPS: 400.0000 mg | ORAL_CAPSULE | Freq: Every day | ORAL | 11 refills | Status: DC

## 2022-11-14 MED ORDER — BISACODYL 10 MG RE SUPP
10.0000 mg | Freq: Every day | RECTAL | Status: DC | PRN
  Administered 2022-11-14: 10 mg via RECTAL
  Filled 2022-11-14: qty 1

## 2022-11-14 NOTE — Discharge Summary (Signed)
Discharge Summary    Patient ID: Joe [name] MRN: [id number]; DOB: 24-Apr-1929  Admit date: 11/12/2022 Discharge date: 11/14/2022  PCP:  [name], [place] Providers Cardiologist:  None  Electrophysiologist:  Virl Axe, MD       Discharge Diagnoses    Principal Problem:   Complete heart block [facility]) Active Problems:   Cardiac pacemaker in situ   Diagnostic Studies/Procedures    Echocardiogram 11/13/2022  EF 60-65, no RWMA, mild LVH, GR 1 DD, normal RVSF, trivial MR, RAP 3  Pacemaker implantation 11/13/2022 [place] pacemaker [place] OPT 1944-52CM; Serial # [id number] Ventricular LEAD ISOFLEX OPT 1948-58CM; Serial # [id number] PACEMAKER ASSURITY DR-RF; Serial # [id number]  _____________   History of Present Illness     Joe [name] is a 88 y.o. male with hypertension, hyperlipidemia, BPH, multiple myeloma, chronic kidney disease stage III who noted symptoms of fatigue during his visit at the [facility]. He was found to be in 2:1 AV block with HRs in the 30s. He was transferred to [facility] for further evaluation and management.  Hospital Course     Consultants: EP - Dr. Caryl Comes   He was seen by Dr. Caryl Comes in EP consultation. Permanent pacemaker implantation was recommended due to complete heart block without reversible causes.  He underwent successful dual-chamber pacemaker implantation on 11/13/2022.  He tolerated procedure well no immediate complications.  Follow-up chest x-ray this morning demonstrated normal placement of his device.  Interrogation of his device demonstrated that his pacemaker is functioning appropriately.  He was evaluated by Dr. Myles Gip this morning.  He is felt to be stable for discharge to home.  Of note, he had magnesium replaced during his admission.  He will be sent home on magnesium oxide with plans to repeat a magnesium level at follow-up in the office.      Did the patient have an acute  coronary syndrome (MI, NSTEMI, STEMI, etc) this admission?:  No                               Did the patient have a percutaneous coronary intervention (stent / angioplasty)?:  No.        _____________  Discharge Vitals Blood pressure 136/62, pulse 66, temperature 97.7 F (36.5 C), temperature source Oral, resp. rate 16, height _0  (1.803 m), weight 79.9 kg, SpO2 100 %.  Filed Weights   11/13/22 1735  Weight: 79.9 kg    Labs & Radiologic Studies    CBC Recent Labs    11/12/22 1309 11/12/22 1424 11/12/22 1449 11/13/22 0214  WBC 3.0* 3.2*  --  2.6*  NEUTROABS 1.0* 1.1*  --   --   HGB 9.9* 10.0* 11.9* 9.0*  HCT 31.5* 32.6* 35.0* 28.5*  MCV 82.9 84.2  --  81.9  PLT 104* 113*  --  202*   Basic Metabolic Panel Recent Labs    11/12/22 1824 11/13/22 0214 11/13/22 0251 11/14/22 0132  NA  --  138  --  137  K  --  4.0  --  3.7  CL  --  107  --  105  CO2  --  25  --  27  GLUCOSE  --  83  --  111*  BUN  --  23  --  18  CREATININE  --  1.25*  --  1.31*  CALCIUM  --  9.0  --  8.9  MG 1.5*  --  1.5*  --    Liver Function Tests Recent Labs    11/12/22 1309 11/12/22 1424  AST 16 19  ALT 16 18  ALKPHOS 47 42  BILITOT 0.6 0.8  PROT 6.8 7.2  ALBUMIN 3.9 3.8   Thyroid Function Tests Recent Labs    11/12/22 1824  TSH 1.799   _____________  DG Chest 2 View  Result Date: 11/14/2022 CLINICAL DATA:  Cardiac device. EXAM: CHEST - 2 VIEW COMPARISON:  11/12/2022 FINDINGS: Interval placement of dual lead left-sided pacemaker with atrial and ventricular leads intact and in adequate position. Lungs are somewhat hypoinflated with persistent moderate elevation of the left hemidiaphragm. Possible minimal linear atelectasis adjacent the left hemidiaphragm. No pneumothorax. Right lung is clear. Cardiomediastinal silhouette and remainder of the exam is unchanged.  IMPRESSION: Interval placement of dual lead left-sided pacemaker in adequate position. No pneumothorax. Stable moderate  elevation left hemidiaphragm with possible mild linear atelectasis left base.  Electronically Signed   By: Marin Olp M.D.   On: 11/14/2022 09:05     DG Chest 2 View  Result Date: 11/12/2022 CLINICAL DATA:  Bradycardia EXAM: CHEST - 2 VIEW COMPARISON:  07/31/2020 FINDINGS: Cardiac shadow is within normal limits. Elevation of the left hemidiaphragm is noted. No focal infiltrate is seen. Minimal scarring is noted in the left base. No acute bony abnormality is noted. Compression deformity at the thoracolumbar junction is again noted and stable.  IMPRESSION: No acute abnormality noted.  Electronically Signed   By: Inez Catalina M.D.   On: 11/12/2022 20:45    Disposition   Pt is being discharged home today in good condition.  Follow-up Plans & Appointments     Follow-up Information     Baldwin Jamaica, PA-C Follow up on 12/01/2022.   Specialty: Cardiology Why: 2:20 PM Contact information: [address] [place] [address] [phone]         Deboraha Sprang, MD Follow up on 02/17/2023.   Specialty: Cardiology Why: 2:15 PM Contact information: [address] [place] [address] [phone]                Discharge Instructions     Diet - low sodium heart healthy   Complete by: As directed    Discharge instructions   Complete by: As directed    See Instructions on AVS   Discharge wound care:   Complete by: As directed    See Instructions on AVS   Increase activity slowly   Complete by: As directed         Discharge Medications   Allergies as of 11/14/2022   No Known Allergies      Medication List     STOP taking these medications    polyethylene glycol powder 17 GM/SCOOP powder Commonly known as: MiraLax       TAKE these medications    acyclovir 400 MG tablet Commonly known as: ZOVIRAX Take 1 tablet (400 mg total) by mouth 2 (two) times daily.   allopurinol 100 MG tablet Commonly known as:  ZYLOPRIM Take 100 mg by mouth daily.   amLODipine 5 MG tablet Commonly known as: NORVASC Take 5 mg by mouth daily.   aspirin EC 81 MG tablet Take 81 mg by mouth daily. Swallow whole.   calcium carbonate 500 MG chewable tablet Commonly known as: TUMS - dosed in mg elemental calcium Chew 3 tablets by mouth daily.  doxazosin 4 MG tablet Commonly known as: CARDURA Take 4 mg by mouth at bedtime.   famotidine 20 MG tablet Commonly known as: PEPCID Take 1 tablet by mouth once daily   finasteride 5 MG tablet Commonly known as: PROSCAR Take 1 tablet by mouth once daily   fosinopril 40 MG tablet Commonly known as: MONOPRIL Take 40 mg by mouth daily.   gabapentin 100 MG capsule Commonly known as: Neurontin Take 1 capsule (100 mg total) by mouth 3 (three) times daily.   indapamide 2.5 MG tablet Commonly known as: LOZOL Take 2.5 mg by mouth daily.   lenalidomide 10 MG capsule Commonly known as: REVLIMID Take 1 tablet daily for 21 days then 7 days off every 28 days  Celgene BMS auth [id number]   Magnesium Oxide 400 MG Caps Take 1 capsule (400 mg total) by mouth daily.   ondansetron 8 MG tablet Commonly known as: ZOFRAN TAKE 1 TABLET BY MOUTH THREE TIMES DAILY AS NEEDED FOR NAUSEA OR VOMITING What changed: See the new instructions.   potassium chloride 10 MEQ tablet Commonly known as: KLOR-CON Take 2 tablets by mouth once daily   pravastatin 40 MG tablet Commonly known as: PRAVACHOL Take 40 mg by mouth daily.   tamsulosin 0.4 MG Caps capsule Commonly known as: FLOMAX TAKE 1 CAPSULE BY MOUTH ONCE DAILY AFTER SUPPER What changed: See the new instructions.               Discharge Care Instructions  (From admission, onward)           Start     Ordered   11/14/22 0000  Discharge wound care:       Comments: See Instructions on AVS   11/14/22 0941               Outstanding Labs/Studies   BMET, Magnesium at F/u visit    Duration of Discharge  Encounter  Greater than 30 minutes including physician time.  Signed, Richardson Dopp, PA-C 11/14/2022, 9:49 AM

## 2022-11-14 NOTE — Plan of Care (Signed)

## 2022-11-16 ENCOUNTER — Encounter (HOSPITAL_COMMUNITY): Payer: Self-pay | Admitting: Internal Medicine

## 2022-11-20 ENCOUNTER — Other Ambulatory Visit: Payer: Self-pay

## 2022-11-20 ENCOUNTER — Inpatient Hospital Stay: Payer: Medicare Other

## 2022-11-20 DIAGNOSIS — C9000 Multiple myeloma not having achieved remission: Secondary | ICD-10-CM | POA: Insufficient documentation

## 2022-11-20 DIAGNOSIS — Z7189 Other specified counseling: Secondary | ICD-10-CM

## 2022-11-20 DIAGNOSIS — Z79899 Other long term (current) drug therapy: Secondary | ICD-10-CM | POA: Diagnosis not present

## 2022-11-20 DIAGNOSIS — I10 Essential (primary) hypertension: Secondary | ICD-10-CM | POA: Diagnosis not present

## 2022-11-20 DIAGNOSIS — Z79624 Long term (current) use of inhibitors of nucleotide synthesis: Secondary | ICD-10-CM | POA: Diagnosis not present

## 2022-11-20 DIAGNOSIS — Z7982 Long term (current) use of aspirin: Secondary | ICD-10-CM | POA: Diagnosis not present

## 2022-11-20 DIAGNOSIS — N400 Enlarged prostate without lower urinary tract symptoms: Secondary | ICD-10-CM | POA: Insufficient documentation

## 2022-11-20 DIAGNOSIS — D649 Anemia, unspecified: Secondary | ICD-10-CM | POA: Insufficient documentation

## 2022-11-20 MED ORDER — DENOSUMAB 120 MG/1.7ML ~~LOC~~ SOLN
120.0000 mg | Freq: Once | SUBCUTANEOUS | Status: AC
  Administered 2022-11-20: 120 mg via SUBCUTANEOUS
  Filled 2022-11-20: qty 1.7

## 2022-11-20 NOTE — Patient Instructions (Signed)
Denosumab injection What is this medication? DENOSUMAB (den oh sue mab) slows bone breakdown. Prolia is used to treat osteoporosis in women after menopause and in men, and in people who are taking corticosteroids for 6 months or more. Xgeva is used to treat a high calcium level due to cancer and to prevent bone fractures and other bone problems caused by multiple myeloma or cancer bone metastases. Xgeva is also used to treat giant cell tumor of the bone. This medicine may be used for other purposes; ask your health care provider or pharmacist if you have questions. COMMON BRAND NAME(S): Prolia, XGEVA What should I tell my care team before I take this medication? They need to know if you have any of these conditions: dental disease having surgery or tooth extraction infection kidney disease low levels of calcium or Vitamin D in the blood malnutrition on hemodialysis skin conditions or sensitivity thyroid or parathyroid disease an unusual reaction to denosumab, other medicines, foods, dyes, or preservatives pregnant or trying to get pregnant breast-feeding How should I use this medication? This medicine is for injection under the skin. It is given by a health care professional in a hospital or clinic setting. A special MedGuide will be given to you before each treatment. Be sure to read this information carefully each time. For Prolia, talk to your pediatrician regarding the use of this medicine in children. Special care may be needed. For Xgeva, talk to your pediatrician regarding the use of this medicine in children. While this drug may be prescribed for children as young as 13 years for selected conditions, precautions do apply. Overdosage: If you think you have taken too much of this medicine contact a poison control center or emergency room at once. NOTE: This medicine is only for you. Do not share this medicine with others. What if I miss a dose? It is important not to miss your dose.  Call your doctor or health care professional if you are unable to keep an appointment. What may interact with this medication? Do not take this medicine with any of the following medications: other medicines containing denosumab This medicine may also interact with the following medications: medicines that lower your chance of fighting infection steroid medicines like prednisone or cortisone This list may not describe all possible interactions. Give your health care provider a list of all the medicines, herbs, non-prescription drugs, or dietary supplements you use. Also tell them if you smoke, drink alcohol, or use illegal drugs. Some items may interact with your medicine. What should I watch for while using this medication? Visit your doctor or health care professional for regular checks on your progress. Your doctor or health care professional may order blood tests and other tests to see how you are doing. Call your doctor or health care professional for advice if you get a fever, chills or sore throat, or other symptoms of a cold or flu. Do not treat yourself. This drug may decrease your body's ability to fight infection. Try to avoid being around people who are sick. You should make sure you get enough calcium and vitamin D while you are taking this medicine, unless your doctor tells you not to. Discuss the foods you eat and the vitamins you take with your health care professional. See your dentist regularly. Brush and floss your teeth as directed. Before you have any dental work done, tell your dentist you are receiving this medicine. Do not become pregnant while taking this medicine or for 5 months after   stopping it. Talk with your doctor or health care professional about your birth control options while taking this medicine. Women should inform their doctor if they wish to become pregnant or think they might be pregnant. There is a potential for serious side effects to an unborn child. Talk to  your health care professional or pharmacist for more information. What side effects may I notice from receiving this medication? Side effects that you should report to your doctor or health care professional as soon as possible: allergic reactions like skin rash, itching or hives, swelling of the face, lips, or tongue bone pain breathing problems dizziness jaw pain, especially after dental work redness, blistering, peeling of the skin signs and symptoms of infection like fever or chills; cough; sore throat; pain or trouble passing urine signs of low calcium like fast heartbeat, muscle cramps or muscle pain; pain, tingling, numbness in the hands or feet; seizures unusual bleeding or bruising unusually weak or tired Side effects that usually do not require medical attention (report to your doctor or health care professional if they continue or are bothersome): constipation diarrhea headache joint pain loss of appetite muscle pain runny nose tiredness upset stomach This list may not describe all possible side effects. Call your doctor for medical advice about side effects. You may report side effects to FDA at 1-800-FDA-1088. Where should I keep my medication? This medicine is only given in a clinic, doctor's office, or other health care setting and will not be stored at home. NOTE: This sheet is a summary. It may not cover all possible information. If you have questions about this medicine, talk to your doctor, pharmacist, or health care provider.  2022 Elsevier/Gold Standard (2018-03-25 16:10:44)  

## 2022-11-29 NOTE — Progress Notes (Unsigned)
Cardiology Office Note Date:  11/29/2022  Patient ID:  Joe, [name] [birth year], MRN [id number] PCP:  [name], MD  Cardiologist:  Dr. Harriet Masson Electrophysiologist: Dr. Caryl Comes  ***refresh   Chief Complaint: *** wound check  History of Present Illness: Joe [name] is a 88 y.o. male with history of HTN, multiple myeloma, CKD (III), heart block w/PPM  Pt was seen by [facility] 12/14 and found to be in 2:1 AVB in the 30s with symptoms of fatigue and episodic dizziness. Denies CP or syncope. No AV nodal agents.Transferred to [facility] for pacemaker consideration.  EP consulted, no reversible causes were found, underwent PPM implant 11/13/22. Discharged 11/14/22  *** site *** restrictions *** 3 mo f/u  Device information Abbott dual chamber PPM implanted 11/04/22   Past Medical History:  Diagnosis Date   Enlarged prostate    Hypertension    Melanoma ([place]) 2021   Multiple myeloma ([place])     Past Surgical History:  Procedure Laterality Date   EYE SURGERY     PACEMAKER IMPLANT N/A 11/13/2022   Procedure: PACEMAKER IMPLANT;  Surgeon: Deboraha Sprang, MD;  Location: [facility];  Service: Cardiovascular;  Laterality: N/A;   PROSTATE SURGERY      Current Outpatient Medications  Medication Sig Dispense Refill   acyclovir (ZOVIRAX) 400 MG tablet Take 1 tablet (400 mg total) by mouth 2 (two) times daily. 180 tablet 3   allopurinol (ZYLOPRIM) 100 MG tablet Take 100 mg by mouth daily.     amLODipine (NORVASC) 5 MG tablet Take 5 mg by mouth daily.     aspirin EC 81 MG tablet Take 81 mg by mouth daily. Swallow whole.      calcium carbonate (TUMS - DOSED IN MG ELEMENTAL CALCIUM) 500 MG chewable tablet Chew 3 tablets by mouth daily.     doxazosin (CARDURA) 4 MG tablet Take 4 mg by mouth at bedtime.     famotidine (PEPCID) 20 MG tablet Take 1 tablet by mouth once daily 90 tablet 0   finasteride (PROSCAR) 5 MG tablet Take 1 tablet by mouth once daily 30 tablet 6    fosinopril (MONOPRIL) 40 MG tablet Take 40 mg by mouth daily.     gabapentin (NEURONTIN) 100 MG capsule Take 1 capsule (100 mg total) by mouth 3 (three) times daily. 90 capsule 4   indapamide (LOZOL) 2.5 MG tablet Take 2.5 mg by mouth daily.      lenalidomide (REVLIMID) 10 MG capsule Take 1 tablet daily for 21 days then 7 days off every 28 days  Celgene BMS auth [id number] 21 capsule 0   Magnesium Oxide 400 MG CAPS Take 1 capsule (400 mg total) by mouth daily. 30 capsule 11   ondansetron (ZOFRAN) 8 MG tablet TAKE 1 TABLET BY MOUTH THREE TIMES DAILY AS NEEDED FOR NAUSEA OR VOMITING (Patient taking differently: Take 8 mg by mouth every 8 (eight) hours as needed for nausea or vomiting.) 20 tablet 0   potassium chloride (KLOR-CON) 10 MEQ tablet Take 2 tablets by mouth once daily 180 tablet 0   pravastatin (PRAVACHOL) 40 MG tablet Take 40 mg by mouth daily.     tamsulosin (FLOMAX) 0.4 MG CAPS capsule TAKE 1 CAPSULE BY MOUTH ONCE DAILY AFTER SUPPER (Patient taking differently: Take 0.4 mg by mouth daily after supper.) 90 capsule 0   No current facility-administered medications for this visit.    Allergies:   Patient has no known allergies.   Social History:  The patient  reports that he has never smoked. He has never used smokeless tobacco. He reports that he does not drink alcohol and does not use drugs.   Family History:  The patient's family history includes Hypertension in his mother.  ROS:  Please see the history of present illness.    All other systems are reviewed and otherwise negative.   PHYSICAL EXAM:  VS:  There were no vitals taken for this visit. BMI: There is no height or weight on file to calculate BMI. Well nourished, well developed, in no acute distress HEENT: normocephalic, atraumatic Neck: no JVD, carotid bruits or masses Cardiac:  *** RRR; no significant murmurs, no rubs, or gallops Lungs:  *** CTA b/l, no wheezing, rhonchi or rales Abd: soft, nontender MS: no deformity or  *** atrophy Ext: *** no edema Skin: warm and dry, no rash Neuro:  No gross deficits appreciated Psych: euthymic mood, full affect  *** PPM site is stable, *** no tethering or discomfort   EKG:  not done today  Device interrogation done today and reviewed by myself:  ***   11/13/22: TTE 1. Left ventricular ejection fraction, by estimation, is 60 to 65%. The  left ventricle has normal function. The left ventricle has no regional  wall motion abnormalities. There is mild left ventricular hypertrophy.  Left ventricular diastolic parameters  are consistent with Grade I diastolic dysfunction (impaired relaxation).   2. Right ventricular systolic function is normal. The right ventricular  size is normal. Tricuspid regurgitation signal is inadequate for assessing  PA pressure.   3. The mitral valve is normal in structure. Trivial mitral valve  regurgitation. No evidence of mitral stenosis.   4. The aortic valve was not well visualized. Aortic valve regurgitation  is not visualized. No aortic stenosis is present.   5. The inferior vena cava is normal in size with <50% respiratory  variability, suggesting right atrial pressure of 8 mmHg.    Recent Labs: 11/12/2022: ALT 18; TSH 1.799 11/13/2022: Hemoglobin 9.0; Magnesium 1.5; Platelets 101 11/14/2022: BUN 18; Creatinine, Ser 1.31; Potassium 3.7; Sodium 137  No results found for requested labs within last 365 days.   CrCl cannot be calculated (Unknown ideal weight.).   Wt Readings from Last 3 Encounters:  11/13/22 176 lb 2.4 oz (79.9 kg)  11/12/22 176 lb 2 oz (79.9 kg)  09/17/22 175 lb 4 oz (79.5 kg)     Other studies reviewed: Additional studies/records reviewed today include: summarized above  ASSESSMENT AND PLAN:  PPM ***  HTN ***  Disposition: F/u with ***  Current medicines are reviewed at length with the patient today.  The patient did not have any concerns regarding medicines.  Venetia Night,  PA-C 11/29/2022 10:14 AM     [facility] [address] [place] [address] [phone] (office)  [phone] (fax)

## 2022-12-01 ENCOUNTER — Ambulatory Visit: Payer: Medicare Other | Attending: Physician Assistant | Admitting: Physician Assistant

## 2022-12-01 ENCOUNTER — Encounter: Payer: Self-pay | Admitting: Oncology

## 2022-12-01 ENCOUNTER — Ambulatory Visit: Payer: Medicare Other | Attending: Physician Assistant

## 2022-12-01 ENCOUNTER — Encounter: Payer: Self-pay | Admitting: Physician Assistant

## 2022-12-01 VITALS — BP 118/60 | HR 60 | Ht 71.0 in | Wt 171.4 lb

## 2022-12-01 DIAGNOSIS — Z5189 Encounter for other specified aftercare: Secondary | ICD-10-CM | POA: Diagnosis not present

## 2022-12-01 DIAGNOSIS — I10 Essential (primary) hypertension: Secondary | ICD-10-CM | POA: Diagnosis not present

## 2022-12-01 DIAGNOSIS — Z950 Presence of cardiac pacemaker: Secondary | ICD-10-CM | POA: Diagnosis not present

## 2022-12-01 LAB — CUP PACEART INCLINIC DEVICE CHECK
Battery Remaining Longevity: 66 mo
Battery Voltage: 3.02 V
Brady Statistic RA Percent Paced: 71 %
Brady Statistic RV Percent Paced: 99.9 %
Date Time Interrogation Session: 20240102162841
Implantable Lead Connection Status: 753985
Implantable Lead Connection Status: 753985
Implantable Lead Implant Date: 20231215
Implantable Lead Implant Date: 20231215
Implantable Lead Location: 753859
Implantable Lead Location: 753860
Implantable Lead Model: 1944
Implantable Lead Model: 1948
Implantable Pulse Generator Implant Date: 20231215
Lead Channel Impedance Value: 525 Ohm
Lead Channel Impedance Value: 675 Ohm
Lead Channel Pacing Threshold Amplitude: 0.5 V
Lead Channel Pacing Threshold Amplitude: 0.5 V
Lead Channel Pacing Threshold Amplitude: 0.75 V
Lead Channel Pacing Threshold Amplitude: 0.75 V
Lead Channel Pacing Threshold Pulse Width: 0.5 ms
Lead Channel Pacing Threshold Pulse Width: 0.5 ms
Lead Channel Pacing Threshold Pulse Width: 0.5 ms
Lead Channel Pacing Threshold Pulse Width: 0.5 ms
Lead Channel Sensing Intrinsic Amplitude: 11.2 mV
Lead Channel Sensing Intrinsic Amplitude: 5 mV
Lead Channel Setting Pacing Amplitude: 3.5 V
Lead Channel Setting Pacing Amplitude: 3.5 V
Lead Channel Setting Pacing Pulse Width: 0.5 ms
Lead Channel Setting Sensing Sensitivity: 4 mV
Pulse Gen Model: 2272
Pulse Gen Serial Number: 8129463

## 2022-12-01 NOTE — Patient Instructions (Signed)
Medication Instructions:   Your physician recommends that you continue on your current medications as directed. Please refer to the Current Medication list given to you today.  *If you need a refill on your cardiac medications before your next appointment, please call your pharmacy*   Lab Work:  [place]   If you have labs (blood work) drawn today and your tests are completely normal, you will receive your results only by: [place] (if you have MyChart) OR A paper copy in the mail If you have any lab test that is abnormal or we need to change your treatment, we will call you to review the results.   Testing/Procedures: NONE ORDERED  TODAY   Follow-Up: At [facility], you and your health needs are our priority.  As part of our continuing mission to provide you with exceptional heart care, we have created designated Provider Care Teams.  These Care Teams include your primary Cardiologist (physician) and Advanced Practice Providers (APPs -  Physician Assistants and Nurse Practitioners) who all work together to provide you with the care you need, when you need it.  We recommend signing up for the patient portal called "MyChart".  Sign up information is provided on this After Visit Summary.  MyChart is used to connect with patients for Virtual Visits (Telemedicine).  Patients are able to view lab/test results, encounter notes, upcoming appointments, etc.  Non-urgent messages can be sent to your provider as well.   To learn more about what you can do with MyChart, go to [email or web address].    Your next appointment:  AS SCHEUDLED   The format for your next appointment:   In Person  Provider:   Virl Axe, MD    Other Instructions   Important Information About Sugar

## 2022-12-02 ENCOUNTER — Other Ambulatory Visit: Payer: Self-pay | Admitting: *Deleted

## 2022-12-02 DIAGNOSIS — C9000 Multiple myeloma not having achieved remission: Secondary | ICD-10-CM

## 2022-12-02 LAB — BASIC METABOLIC PANEL
BUN/Creatinine Ratio: 22 (ref 10–24)
BUN: 28 mg/dL (ref 10–36)
CO2: 24 mmol/L (ref 20–29)
Calcium: 8.6 mg/dL (ref 8.6–10.2)
Chloride: 100 mmol/L (ref 96–106)
Creatinine, Ser: 1.26 mg/dL (ref 0.76–1.27)
Glucose: 89 mg/dL (ref 70–99)
Potassium: 4.1 mmol/L (ref 3.5–5.2)
Sodium: 137 mmol/L (ref 134–144)
eGFR: 54 mL/min/{1.73_m2} — ABNORMAL LOW (ref >59)

## 2022-12-02 LAB — MAGNESIUM: Magnesium: 1.5 mg/dL — ABNORMAL LOW (ref 1.6–2.3)

## 2022-12-02 MED ORDER — LENALIDOMIDE 10 MG PO CAPS: ORAL_CAPSULE | ORAL | 0 refills | Status: DC

## 2022-12-02 NOTE — Telephone Encounter (Signed)
Received call from [place] - refill requested for Revlimid 10 mg Refilled per Dr. Rob Hickman OV note 11/12/22

## 2022-12-03 ENCOUNTER — Telehealth: Payer: Self-pay | Admitting: *Deleted

## 2022-12-03 DIAGNOSIS — Z79899 Other long term (current) drug therapy: Secondary | ICD-10-CM

## 2022-12-03 MED ORDER — MAGNESIUM OXIDE 400 MG PO CAPS: ORAL_CAPSULE | ORAL | 1 refills | Status: DC

## 2022-12-03 NOTE — Telephone Encounter (Signed)
-----   Message from Liliane Shi, [place] sent at 12/02/2022 10:51 AM EST ----- See MyChart Comments PLAN:  - Increase Magnesium Oxide to 400 mg twice daily x 2 weeks, then reduce to 400 mg once daily  - BMET, Magnesium level in 1 month  Richardson Dopp, PA-C    12/02/2022 10:49 AM

## 2022-12-14 ENCOUNTER — Other Ambulatory Visit: Payer: Self-pay | Admitting: Hematology and Oncology

## 2022-12-14 DIAGNOSIS — C9000 Multiple myeloma not having achieved remission: Secondary | ICD-10-CM

## 2022-12-24 ENCOUNTER — Other Ambulatory Visit: Payer: Self-pay | Admitting: Hematology and Oncology

## 2022-12-24 DIAGNOSIS — D472 Monoclonal gammopathy: Secondary | ICD-10-CM

## 2022-12-25 ENCOUNTER — Other Ambulatory Visit: Payer: Self-pay | Admitting: Hematology and Oncology

## 2022-12-25 DIAGNOSIS — C9000 Multiple myeloma not having achieved remission: Secondary | ICD-10-CM

## 2022-12-25 NOTE — Progress Notes (Signed)
Labs ordered.

## 2022-12-28 ENCOUNTER — Other Ambulatory Visit: Payer: Self-pay

## 2022-12-28 ENCOUNTER — Telehealth: Payer: Self-pay

## 2022-12-28 ENCOUNTER — Inpatient Hospital Stay: Payer: Medicare Other | Attending: Hematology and Oncology

## 2022-12-28 ENCOUNTER — Other Ambulatory Visit: Payer: Self-pay | Admitting: Hematology and Oncology

## 2022-12-28 DIAGNOSIS — Z79624 Long term (current) use of inhibitors of nucleotide synthesis: Secondary | ICD-10-CM | POA: Diagnosis not present

## 2022-12-28 DIAGNOSIS — Z79899 Other long term (current) drug therapy: Secondary | ICD-10-CM | POA: Insufficient documentation

## 2022-12-28 DIAGNOSIS — I442 Atrioventricular block, complete: Secondary | ICD-10-CM

## 2022-12-28 DIAGNOSIS — Z7982 Long term (current) use of aspirin: Secondary | ICD-10-CM | POA: Insufficient documentation

## 2022-12-28 DIAGNOSIS — N400 Enlarged prostate without lower urinary tract symptoms: Secondary | ICD-10-CM | POA: Insufficient documentation

## 2022-12-28 DIAGNOSIS — D649 Anemia, unspecified: Secondary | ICD-10-CM | POA: Insufficient documentation

## 2022-12-28 DIAGNOSIS — C9000 Multiple myeloma not having achieved remission: Secondary | ICD-10-CM | POA: Diagnosis not present

## 2022-12-28 DIAGNOSIS — I159 Secondary hypertension, unspecified: Secondary | ICD-10-CM

## 2022-12-28 DIAGNOSIS — I10 Essential (primary) hypertension: Secondary | ICD-10-CM | POA: Diagnosis not present

## 2022-12-28 LAB — CMP (CANCER CENTER ONLY)
ALT: 13 U/L (ref 0–44)
AST: 15 U/L (ref 15–41)
Albumin: 3.4 g/dL — ABNORMAL LOW (ref 3.5–5.0)
Alkaline Phosphatase: 46 U/L (ref 38–126)
Anion gap: 5 (ref 5–15)
BUN: 21 mg/dL (ref 8–23)
CO2: 26 mmol/L (ref 22–32)
Calcium: 8.2 mg/dL — ABNORMAL LOW (ref 8.9–10.3)
Chloride: 106 mmol/L (ref 98–111)
Creatinine: 1.16 mg/dL (ref 0.61–1.24)
GFR, Estimated: 59 mL/min — ABNORMAL LOW (ref >60)
Glucose, Bld: 86 mg/dL (ref 70–99)
Potassium: 3.3 mmol/L — ABNORMAL LOW (ref 3.5–5.1)
Sodium: 137 mmol/L (ref 135–145)
Total Bilirubin: 0.5 mg/dL (ref 0.3–1.2)
Total Protein: 6.6 g/dL (ref 6.5–8.1)

## 2022-12-28 LAB — CBC WITH DIFFERENTIAL/PLATELET
Abs Immature Granulocytes: 0.01 10*3/uL (ref 0.00–0.07)
Basophils Absolute: 0 10*3/uL (ref 0.0–0.1)
Basophils Relative: 1 %
Eosinophils Absolute: 0.3 10*3/uL (ref 0.0–0.5)
Eosinophils Relative: 8 %
HCT: 26.8 % — ABNORMAL LOW (ref 39.0–52.0)
Hemoglobin: 8.6 g/dL — ABNORMAL LOW (ref 13.0–17.0)
Immature Granulocytes: 0 %
Lymphocytes Relative: 35 %
Lymphs Abs: 1.2 10*3/uL (ref 0.7–4.0)
MCH: 26.1 pg (ref 26.0–34.0)
MCHC: 32.1 g/dL (ref 30.0–36.0)
MCV: 81.5 fL (ref 80.0–100.0)
Monocytes Absolute: 0.4 10*3/uL (ref 0.1–1.0)
Monocytes Relative: 11 %
Neutro Abs: 1.5 10*3/uL — ABNORMAL LOW (ref 1.7–7.7)
Neutrophils Relative %: 45 %
Platelets: 68 10*3/uL — ABNORMAL LOW (ref 150–400)
RBC: 3.29 MIL/uL — ABNORMAL LOW (ref 4.22–5.81)
RDW: 14.6 % (ref 11.5–15.5)
WBC: 3.4 10*3/uL — ABNORMAL LOW (ref 4.0–10.5)
nRBC: 0 % (ref 0.0–0.2)

## 2022-12-28 LAB — MAGNESIUM: Magnesium: 1.6 mg/dL — ABNORMAL LOW (ref 1.7–2.4)

## 2022-12-28 NOTE — Telephone Encounter (Signed)
-----  Message from Benay Pike, MD sent at 12/28/2022  2:20 PM EST ----- Can you ask Mr Pinzon to hold revelimid until he comes and see's me on 12 th.  Thanks

## 2022-12-28 NOTE — Telephone Encounter (Signed)
Pt given recommendation per MD. He verbalized understanding and knows to call with any further concerns.

## 2022-12-29 LAB — IGG, IGA, IGM
IgA: 268 mg/dL (ref 61–437)
IgG (Immunoglobin G), Serum: 1373 mg/dL (ref 603–1613)
IgM (Immunoglobulin M), Srm: 43 mg/dL (ref 15–143)

## 2022-12-29 LAB — KAPPA/LAMBDA LIGHT CHAINS
Kappa free light chain: 72.1 mg/L — ABNORMAL HIGH (ref 3.3–19.4)
Kappa, lambda light chain ratio: 2.23 — ABNORMAL HIGH (ref 0.26–1.65)
Lambda free light chains: 32.3 mg/L — ABNORMAL HIGH (ref 5.7–26.3)

## 2022-12-30 ENCOUNTER — Other Ambulatory Visit: Payer: Medicare Other

## 2022-12-31 LAB — PROTEIN ELECTROPHORESIS, SERUM, WITH REFLEX
A/G Ratio: 1.2 (ref 0.7–1.7)
Albumin ELP: 3.4 g/dL (ref 2.9–4.4)
Alpha-1-Globulin: 0.2 g/dL (ref 0.0–0.4)
Alpha-2-Globulin: 0.6 g/dL (ref 0.4–1.0)
Beta Globulin: 0.7 g/dL (ref 0.7–1.3)
Gamma Globulin: 1.3 g/dL (ref 0.4–1.8)
Globulin, Total: 2.8 g/dL (ref 2.2–3.9)
Total Protein ELP: 6.2 g/dL (ref 6.0–8.5)

## 2023-01-11 ENCOUNTER — Other Ambulatory Visit: Payer: Self-pay

## 2023-01-11 ENCOUNTER — Inpatient Hospital Stay: Payer: Medicare Other

## 2023-01-11 ENCOUNTER — Inpatient Hospital Stay: Payer: Medicare Other | Attending: Hematology and Oncology | Admitting: Hematology and Oncology

## 2023-01-11 ENCOUNTER — Encounter: Payer: Self-pay | Admitting: Hematology and Oncology

## 2023-01-11 VITALS — BP 126/63 | HR 83 | Temp 97.7°F | Resp 16 | Ht 71.0 in | Wt 171.1 lb

## 2023-01-11 DIAGNOSIS — D696 Thrombocytopenia, unspecified: Secondary | ICD-10-CM | POA: Insufficient documentation

## 2023-01-11 DIAGNOSIS — Z79624 Long term (current) use of inhibitors of nucleotide synthesis: Secondary | ICD-10-CM | POA: Diagnosis not present

## 2023-01-11 DIAGNOSIS — Z950 Presence of cardiac pacemaker: Secondary | ICD-10-CM | POA: Insufficient documentation

## 2023-01-11 DIAGNOSIS — C9000 Multiple myeloma not having achieved remission: Secondary | ICD-10-CM

## 2023-01-11 DIAGNOSIS — Z7982 Long term (current) use of aspirin: Secondary | ICD-10-CM | POA: Diagnosis not present

## 2023-01-11 DIAGNOSIS — I10 Essential (primary) hypertension: Secondary | ICD-10-CM | POA: Insufficient documentation

## 2023-01-11 DIAGNOSIS — Z79899 Other long term (current) drug therapy: Secondary | ICD-10-CM | POA: Diagnosis not present

## 2023-01-11 LAB — COMPREHENSIVE METABOLIC PANEL
ALT: 9 U/L (ref 0–44)
AST: 14 U/L — ABNORMAL LOW (ref 15–41)
Albumin: 3.7 g/dL (ref 3.5–5.0)
Alkaline Phosphatase: 48 U/L (ref 38–126)
Anion gap: 4 — ABNORMAL LOW (ref 5–15)
BUN: 23 mg/dL (ref 8–23)
CO2: 28 mmol/L (ref 22–32)
Calcium: 9 mg/dL (ref 8.9–10.3)
Chloride: 107 mmol/L (ref 98–111)
Creatinine, Ser: 1.21 mg/dL (ref 0.61–1.24)
GFR, Estimated: 57 mL/min — ABNORMAL LOW (ref >60)
Glucose, Bld: 81 mg/dL (ref 70–99)
Potassium: 4.3 mmol/L (ref 3.5–5.1)
Sodium: 139 mmol/L (ref 135–145)
Total Bilirubin: 0.6 mg/dL (ref 0.3–1.2)
Total Protein: 6.8 g/dL (ref 6.5–8.1)

## 2023-01-11 LAB — CBC WITH DIFFERENTIAL/PLATELET
Abs Immature Granulocytes: 0.01 10*3/uL (ref 0.00–0.07)
Basophils Absolute: 0 10*3/uL (ref 0.0–0.1)
Basophils Relative: 1 %
Eosinophils Absolute: 0.1 10*3/uL (ref 0.0–0.5)
Eosinophils Relative: 2 %
HCT: 27.7 % — ABNORMAL LOW (ref 39.0–52.0)
Hemoglobin: 9 g/dL — ABNORMAL LOW (ref 13.0–17.0)
Immature Granulocytes: 0 %
Lymphocytes Relative: 36 %
Lymphs Abs: 1.2 10*3/uL (ref 0.7–4.0)
MCH: 26.9 pg (ref 26.0–34.0)
MCHC: 32.5 g/dL (ref 30.0–36.0)
MCV: 82.9 fL (ref 80.0–100.0)
Monocytes Absolute: 0.5 10*3/uL (ref 0.1–1.0)
Monocytes Relative: 13 %
Neutro Abs: 1.6 10*3/uL — ABNORMAL LOW (ref 1.7–7.7)
Neutrophils Relative %: 48 %
Platelets: 108 10*3/uL — ABNORMAL LOW (ref 150–400)
RBC: 3.34 MIL/uL — ABNORMAL LOW (ref 4.22–5.81)
RDW: 15.7 % — ABNORMAL HIGH (ref 11.5–15.5)
WBC: 3.4 10*3/uL — ABNORMAL LOW (ref 4.0–10.5)
nRBC: 0 % (ref 0.0–0.2)

## 2023-01-11 NOTE — Progress Notes (Signed)
[place]  Telephone:([phone]) [phone] Fax:(336) [phone]     ID: Joe Joe [name] DOB: 01-Dec-1928  MR#: [id number]  [id number]  Patient Care Team: [name], MD as PCP - General (Internal Medicine) Deboraha Sprang, MD as PCP - Electrophysiology (Cardiology) Long-Stokes, Adalberto Ill, MD  CHIEF COMPLAINT: multiple myeloma  CURRENT TREATMENT: denosumab Xgeva every 12 weeks; lenalidomide 10 mg 21/7  INTERVAL HISTORY:  Patient is here for follow-up.  Since last visit, he has a pacemaker placement.  He tells me that he feels so much better today.  He did not realize how Joe [name] he felt until he started feeling better.  He denies any more fatigue.  He has no more dizziness.  He is okay holding Revlimid.  He will have follow-up with cardiology in March.  Rest of the pertinent 10 point ROS reviewed and negative  No change in breathing or bowel habits or urinary habits.  Rest of the pertinent 10 point ROS reviewed and negative.  Lab Results  Component Value Date   KAPLAMBRATIO 2.23 (H) 12/28/2022   KAPLAMBRATIO 2.19 (H) 09/17/2022   KAPLAMBRATIO 2.25 (H) 07/28/2022   KAPLAMBRATIO 2.40 (H) 06/25/2022   KAPLAMBRATIO 2.41 (H) 05/25/2022    REVIEW OF SYSTEMS:  Rest of the pertinent 10 point ROS reviewed and negative   COVID 19 VACCINATION STATUS: received [place] x2 with booster October 2021.  He received evusheld on 03/20/2021 and is due a second dose   HISTORY OF CURRENT ILLNESS:  From the original intake note:  Joe Joe [name] is an 89 y/o [place] man formerly followed by my partner Dr Julien Nordmann for M-GUS. We have an M-Spike of 0.87 documented 07/12/2012. [name] kappa/lambda light chains and total IgG were follower yearly until 01/2016, when they were 2.36 and 1517 respectively (unremarkable). He was lost to follow-up after that point.  [02/29/2020] the patient presented to Urgent Care with poorly controlled pain. This was felt to be mussculoskeletal and he was  started on a medrol dose-pak and robaxin. However his pain worsened and he presented to the ED at [facility] where vitals and exam were unremarkable but labs showed a creatinine of 1.84 (baseline 1.0), calcium 12.7 with albumin 2.8 and total protein 10.4 (globulin fraction 7.8).  CT of the abdomen obtained for evaluation of his abdominal and back pain showed no hydronephrosis but multiple lytic lesons.   The patient's subsequent history is as detailed below.  PAST MEDICAL HISTORY: Past Medical History:  Diagnosis Date   Enlarged prostate    Hypertension    Melanoma ([place]) 2021   Multiple myeloma ([place])     PAST SURGICAL HISTORY: Past Surgical History:  Procedure Laterality Date   EYE SURGERY     PACEMAKER IMPLANT N/A 11/13/2022   Procedure: PACEMAKER IMPLANT;  Surgeon: Deboraha Sprang, MD;  Location: [place] [facility];  Service: Cardiovascular;  Laterality: N/A;   PROSTATE SURGERY      FAMILY HISTORY Family History  Problem Relation Age of Onset   Hypertension Mother     SOCIAL HISTORY:  Retired, used to work for the [employer] Lives with wife Joe Joe [name]. Has 4 children, 2 in [place], one in [place], one in [place]; 5 grandchildren and 5 great-grandchildren. Attends a [name].    ADVANCED DIRECTIVES: at the initial visit the patient confirmed his wife is his HCPOA (despite her memory issues).   HEALTH MAINTENANCE: Social History   Tobacco Use   Smoking status: Never   Smokeless tobacco:  Never  Vaping Use   Vaping Use: Never used  Substance Use Topics   Alcohol use: Never   Drug use: Never     Colonoscopy: 6-8 years ago     No Known Allergies  Current Outpatient Medications  Medication Sig Dispense Refill   acyclovir (ZOVIRAX) 400 MG tablet Take 1 tablet (400 mg total) by mouth 2 (two) times daily. 180 tablet 3   allopurinol (ZYLOPRIM) 100 MG tablet Take 100 mg by mouth daily.     amLODipine (NORVASC) 5 MG tablet Take  5 mg by mouth daily.     aspirin EC 81 MG tablet Take 81 mg by mouth daily. Swallow whole.      calcium carbonate (TUMS - DOSED IN MG ELEMENTAL CALCIUM) 500 MG chewable tablet Chew 3 tablets by mouth daily.     doxazosin (CARDURA) 4 MG tablet Take 4 mg by mouth at bedtime.     famotidine (PEPCID) 20 MG tablet Take 1 tablet by mouth once daily 90 tablet 0   finasteride (PROSCAR) 5 MG tablet Take 1 tablet by mouth once daily 30 tablet 0   fosinopril (MONOPRIL) 40 MG tablet Take 40 mg by mouth daily.     gabapentin (NEURONTIN) 100 MG capsule Take 1 capsule (100 mg total) by mouth 3 (three) times daily. 90 capsule 4   indapamide (LOZOL) 2.5 MG tablet Take 2.5 mg by mouth daily.      lenalidomide (REVLIMID) 10 MG capsule Take 1 tablet daily for 21 days then 7 days off every 28 days 21 capsule 0   Magnesium Oxide 400 MG CAPS Take 400 mg by mouth twice a day for 2 weeks then reduce back to 400 mg daily 180 capsule 1   ondansetron (ZOFRAN) 8 MG tablet TAKE 1 TABLET BY MOUTH THREE TIMES DAILY AS NEEDED FOR NAUSEA OR VOMITING (Patient taking differently: Take 8 mg by mouth every 8 (eight) hours as needed for nausea or vomiting.) 20 tablet 0   potassium chloride (KLOR-CON) 10 MEQ tablet Take 2 tablets by mouth once daily 180 tablet 0   pravastatin (PRAVACHOL) 40 MG tablet Take 40 mg by mouth daily.     tamsulosin (FLOMAX) 0.4 MG CAPS capsule TAKE 1 CAPSULE BY MOUTH ONCE DAILY AFTER SUPPER (Patient taking differently: Take 0.4 mg by mouth daily after supper.) 90 capsule 0   No current facility-administered medications for this visit.    OBJECTIVE: African-American man in no acute distress Vitals:   01/11/23 1247  BP: 126/63  Pulse: 83  Resp: 16  Temp: 97.7 F (36.5 C)  SpO2: 100%         Body mass index is 23.86 kg/m.   Wt Readings from Last 3 Encounters:  01/11/23 171 lb 1.6 oz (77.6 kg)  12/01/22 171 lb 6.4 oz (77.7 kg)  11/13/22 176 lb 2.4 oz (79.9 kg)  ECOG FS:1 - Symptomatic but  completely ambulatory  Physical Exam Constitutional:      Appearance: Normal appearance.  HENT:     Head: Normocephalic and atraumatic.  Cardiovascular:     Rate and Rhythm: Normal rate and regular rhythm.  Abdominal:     General: Abdomen is flat.     Palpations: Abdomen is soft.  Musculoskeletal:        General: No swelling or tenderness.     Cervical back: Normal range of motion and neck supple.  Neurological:     General: No focal deficit present.     Mental Status: He is  alert.  Psychiatric:        Mood and Affect: Mood normal.      LAB RESULTS:  CMP     Component Value Date/Time   NA 137 12/28/2022 1325   NA 137 12/01/2022 1337   NA 140 01/09/2016 1045   K 3.3 (L) 12/28/2022 1325   K 3.8 01/09/2016 1045   CL 106 12/28/2022 1325   CO2 26 12/28/2022 1325   CO2 27 01/09/2016 1045   GLUCOSE 86 12/28/2022 1325   GLUCOSE 89 01/09/2016 1045   BUN 21 12/28/2022 1325   BUN 28 12/01/2022 1337   BUN 14.7 01/09/2016 1045   CREATININE 1.16 12/28/2022 1325   CREATININE 1.0 01/09/2016 1045   CALCIUM 8.2 (L) 12/28/2022 1325   CALCIUM 9.2 01/09/2016 1045   PROT 6.6 12/28/2022 1325   PROT 7.3 01/09/2016 1045   ALBUMIN 3.4 (L) 12/28/2022 1325   ALBUMIN 3.7 01/09/2016 1045   AST 15 12/28/2022 1325   AST 18 01/09/2016 1045   ALT 13 12/28/2022 1325   ALT 15 01/09/2016 1045   ALKPHOS 46 12/28/2022 1325   ALKPHOS 60 01/09/2016 1045   BILITOT 0.5 12/28/2022 1325   BILITOT 0.51 01/09/2016 1045   GFRNONAA 59 (L) 12/28/2022 1325   GFRAA >60 08/30/2020 1402    Lab Results  Component Value Date   TOTALPROTELP 6.2 12/28/2022   ALBUMINELP 3.4 12/28/2022   A1GS 0.2 12/28/2022   A2GS 0.6 12/28/2022   BETS 0.7 12/28/2022   BETA2SER 3.6 07/12/2012   GAMS 1.3 12/28/2022   MSPIKE Not Observed 12/28/2022   SPEI * 07/12/2012     Lab Results  Component Value Date   KPAFRELGTCHN 72.1 (H) 12/28/2022   LAMBDASER 32.3 (H) 12/28/2022   KAPLAMBRATIO 2.23 (H) 12/28/2022    Lab  Results  Component Value Date   WBC 3.4 (L) 12/28/2022   NEUTROABS 1.5 (L) 12/28/2022   HGB 8.6 (L) 12/28/2022   HCT 26.8 (L) 12/28/2022   MCV 81.5 12/28/2022   PLT 68 (L) 12/28/2022      Chemistry      Component Value Date/Time   NA 137 12/28/2022 1325   NA 137 12/01/2022 1337   NA 140 01/09/2016 1045   K 3.3 (L) 12/28/2022 1325   K 3.8 01/09/2016 1045   CL 106 12/28/2022 1325   CO2 26 12/28/2022 1325   CO2 27 01/09/2016 1045   BUN 21 12/28/2022 1325   BUN 28 12/01/2022 1337   BUN 14.7 01/09/2016 1045   CREATININE 1.16 12/28/2022 1325   CREATININE 1.0 01/09/2016 1045      Component Value Date/Time   CALCIUM 8.2 (L) 12/28/2022 1325   CALCIUM 9.2 01/09/2016 1045   ALKPHOS 46 12/28/2022 1325   ALKPHOS 60 01/09/2016 1045   AST 15 12/28/2022 1325   AST 18 01/09/2016 1045   ALT 13 12/28/2022 1325   ALT 15 01/09/2016 1045   BILITOT 0.5 12/28/2022 1325   BILITOT 0.51 01/09/2016 1045       No results found for: "LABCA2"  No components found for: "[id number]"  No results for input(s): "INR" in the last 168 hours.  No results found for: "LABCA2"  No results found for: "[id number]"  No results found for: "CAN125"  No results found for: "CAN153"  No results found for: "CA2729"  No components found for: "HGQUANT"  No results found for: "CEA1", "CEA" / No results found for: "CEA1", "CEA"   No results found for: "AFPTUMOR"  No results found  for: "CHROMOGRNA"  No results found for: "HGBA", "HGBA2QUANT", "HGBFQUANT", "HGBSQUAN" (Hemoglobinopathy evaluation)   Lab Results  Component Value Date   LDH 136 01/09/2016    Lab Results  Component Value Date   IRON 28 (L) 03/26/2022   TIBC 304 03/26/2022   IRONPCTSAT 9 (L) 03/26/2022   (Iron and TIBC)  Lab Results  Component Value Date   FERRITIN 142 05/25/2022    Urinalysis    Component Value Date/Time   COLORURINE AMBER (A) 03/01/2020 1052   APPEARANCEUR CLOUDY (A) 03/01/2020 1052   LABSPEC 1.021  03/01/2020 1052   PHURINE 5.0 03/01/2020 1052   GLUCOSEU NEGATIVE 03/01/2020 1052   HGBUR NEGATIVE 03/01/2020 1052   BILIRUBINUR NEGATIVE 03/01/2020 1052   KETONESUR NEGATIVE 03/01/2020 1052   PROTEINUR 100 (A) 03/01/2020 1052   UROBILINOGEN 0.2 02/29/2020 1537   NITRITE NEGATIVE 03/01/2020 1052   LEUKOCYTESUR NEGATIVE 03/01/2020 1052    STUDIES: No results found.   ELIGIBLE FOR AVAILABLE RESEARCH PROTOCOL:   ASSESSMENT: 89 y.o.  66 [place] man with a history of M-GUS, presenting 02/29/2020 with a high globulin fraction, worsening renal function, hypercalcemia, anemia and multiple lytic bone lesions.    (1) IgG Kappa Multiple Myeloma: labs diagnostic on 03/01/2020 with Mspike of 3.8, Kappa free light chain 690.6, and ratio 117.05.  (a) CT A/P on 03/01/2020 shows "innumerable" lytic lesions involving all visualized bones  (b) bone marrow biopsy 03/11/2020 shows 20% plasmacytosis by CD138, 12% by manual differential; molecular studies not requested  (c) Bortezomib and Dexamethasone given weekly 3 weeks on and 1 week off beginning 03/11/2020, with good response, last dose 07/24/2020  (d) transitioned to lenalidomide 10 mg daily 21/7 starting 07/31/2020    (2) lytic bone lesions/ hypercalcemia:  (a) Pamidronate administered on 03/01/2020  (b) denosumab/Xgeva started 04/17/2020-- repeat every 12 weeks   (a) to take TUMS 3 tabs on day of Xgeva dose  PLAN:   Since his last visit, he was found to have third-degree heart block and had a pacemaker placed.  He is now following up with cardiology. He was also found to have severe thrombocytopenia on his most recent labs, otherwise no evidence of active myeloma.  At this time I recommended that we continue to hold Revlimid, repeat labs every 8 weeks to see if he would relapse. He is agreeable to this plan.  He understands this is not the best approach but may be the best approach for him. Given his severe thrombocytopenia on his most recent labs, I  have ordered a repeat CBC today.  He was of course strongly encouraged to contact us with any new questions or concerns. Total time spent: 30 min  *Total Encounter Time as defined by the Centers for Medicare and Medicaid Services includes, in addition to the face-to-face time of a patient visit (documented in the note above) non-face-to-face time: obtaining and reviewing outside history, ordering and reviewing medications, tests or procedures, care coordination (communications with other health care professionals or caregivers) and documentation in the medical record.

## 2023-01-18 ENCOUNTER — Other Ambulatory Visit: Payer: Self-pay | Admitting: Hematology and Oncology

## 2023-01-18 DIAGNOSIS — D472 Monoclonal gammopathy: Secondary | ICD-10-CM

## 2023-01-25 ENCOUNTER — Other Ambulatory Visit: Payer: Self-pay | Admitting: *Deleted

## 2023-01-25 DIAGNOSIS — D472 Monoclonal gammopathy: Secondary | ICD-10-CM

## 2023-01-25 MED ORDER — FINASTERIDE 5 MG PO TABS: 5.0000 mg | ORAL_TABLET | Freq: Every day | ORAL | 3 refills | Status: DC

## 2023-01-29 ENCOUNTER — Other Ambulatory Visit: Payer: Self-pay | Admitting: Hematology and Oncology

## 2023-02-11 IMAGING — DX DG BONE SURVEY MET
9 of 10 series · 9 of 10 positions shown · non-contrast
Comparison: 07/19/2009, 07/31/2020

CLINICAL DATA: Multiple myeloma, back pain

EXAM:
METASTATIC BONE SURVEY

[skull lat]
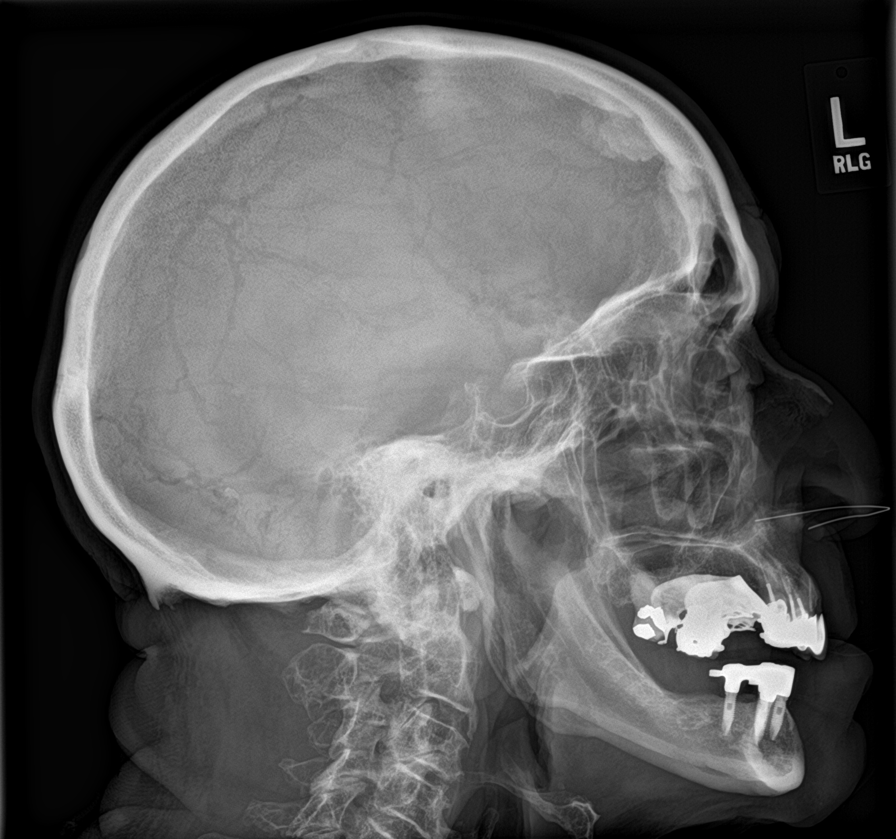

[shoulder ap (1 of 2)]
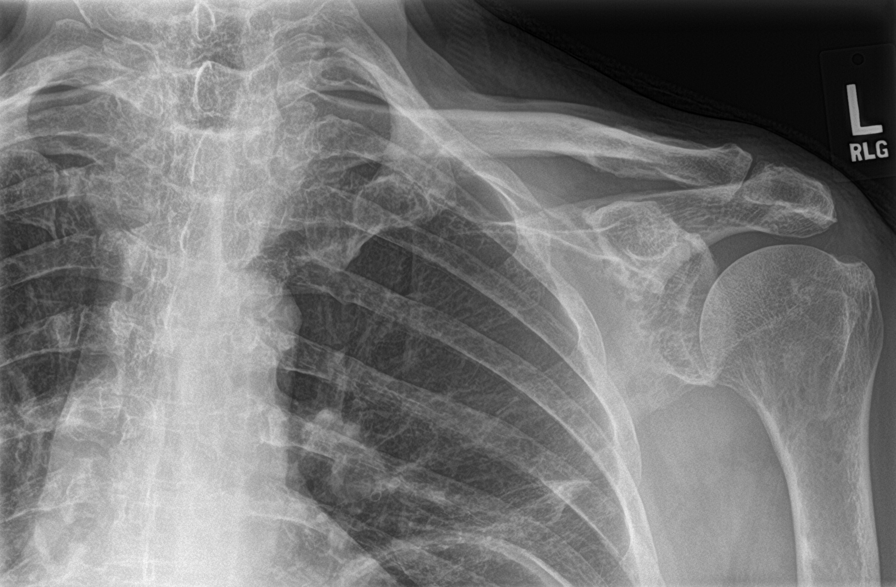

[shoulder ap (2 of 2)]
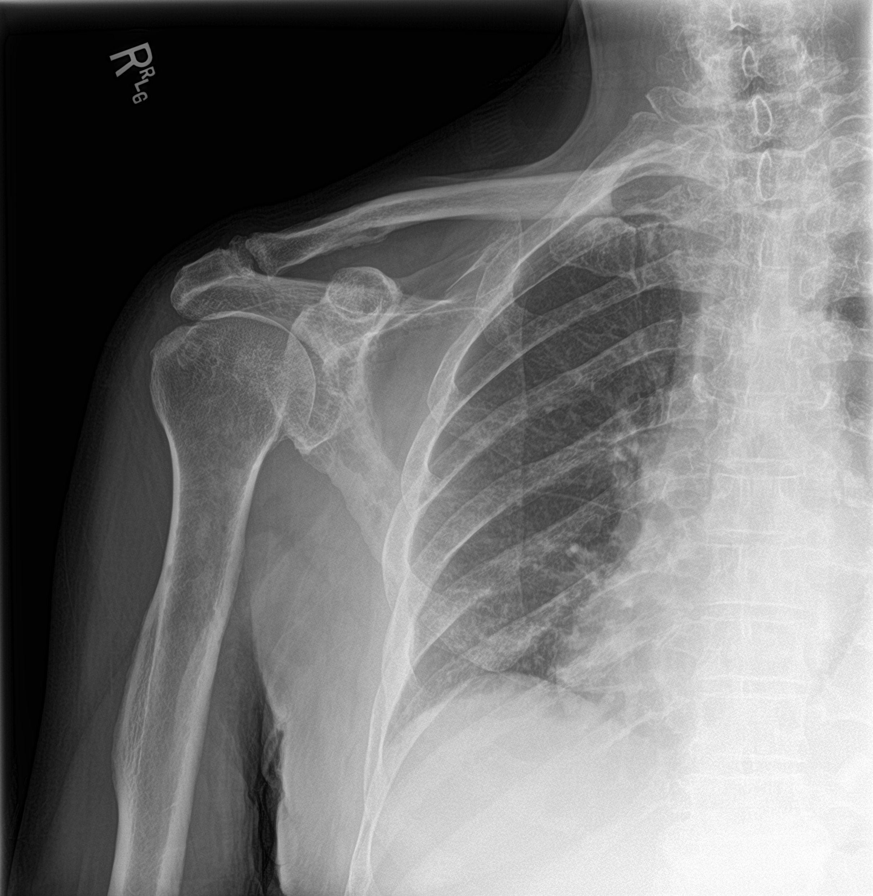

[humerus ap (1 of 2)]
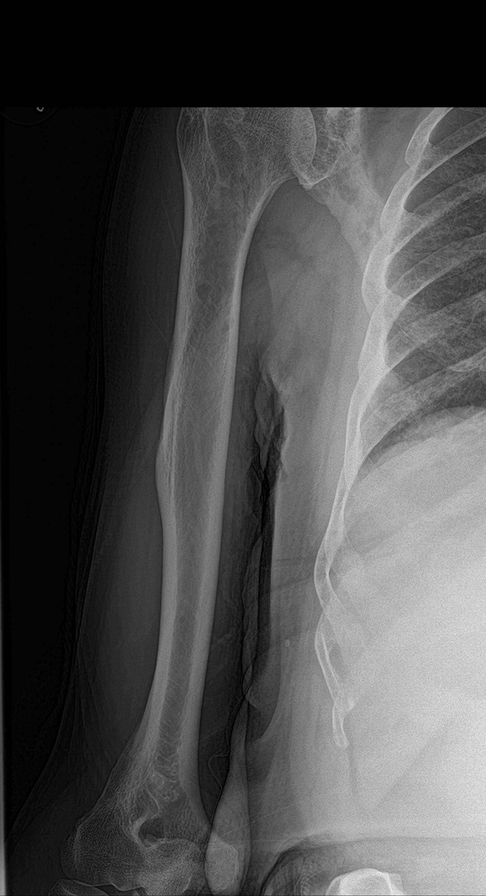

[humerus ap (2 of 2)]
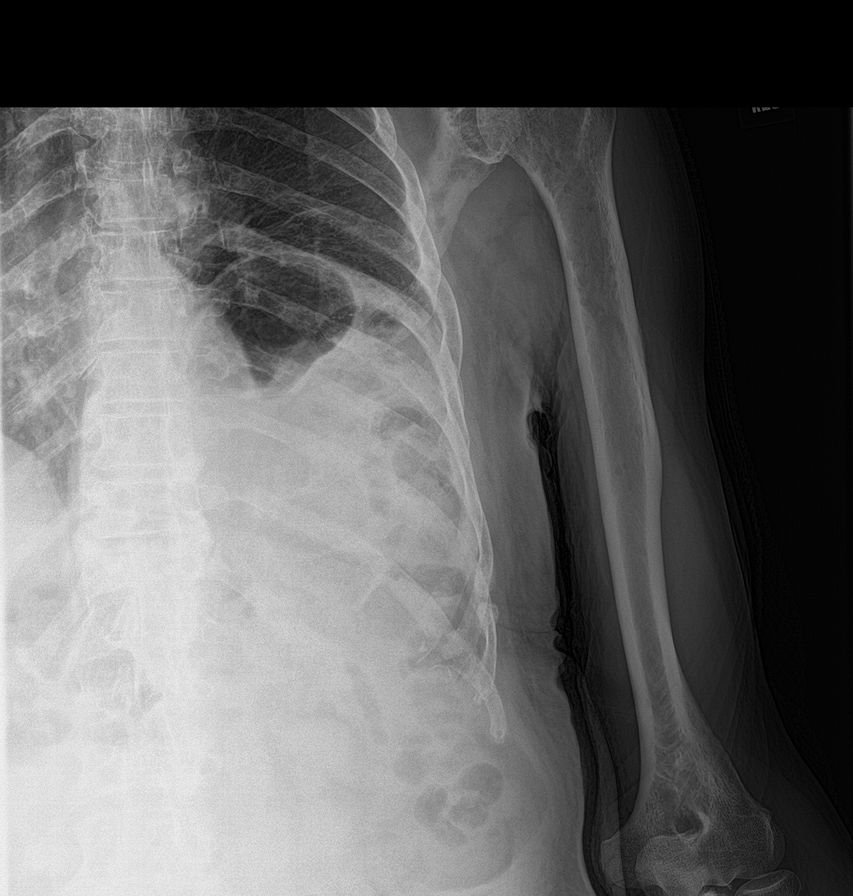

[forearm ap (1 of 2)]
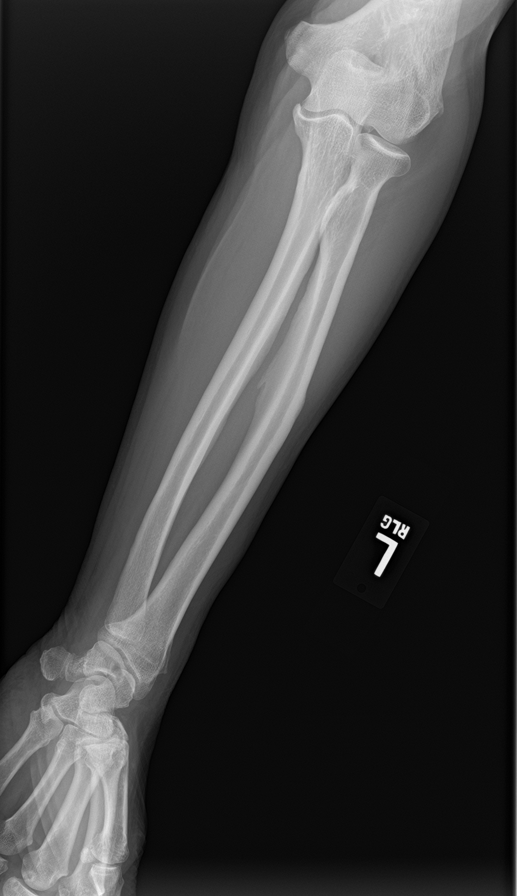

[forearm ap (2 of 2)]
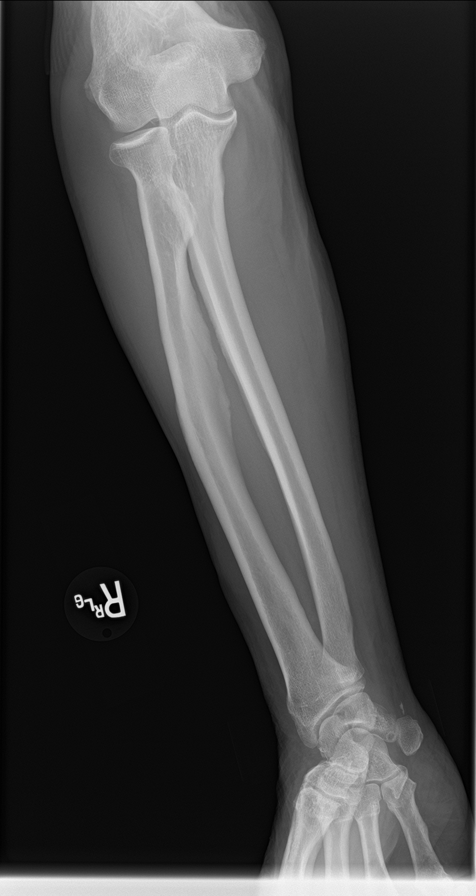

[c-spine ap]
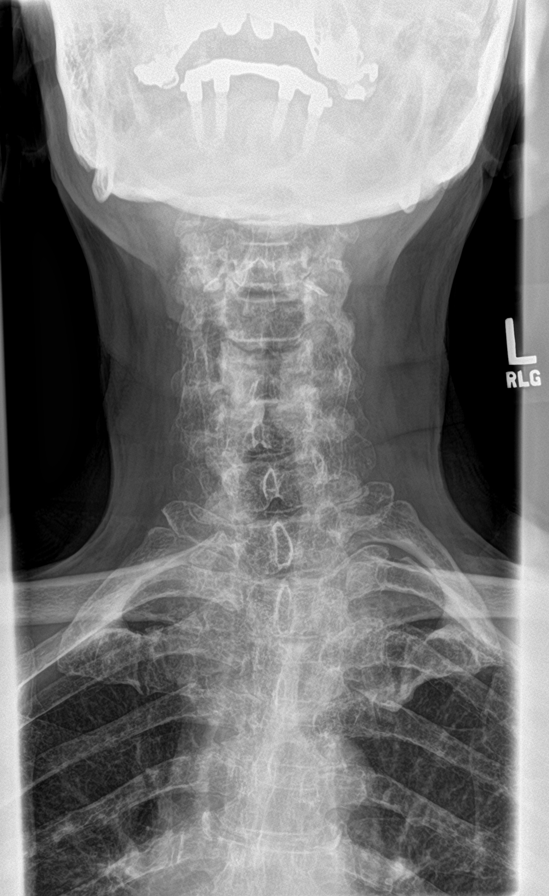

[c-spine lat]
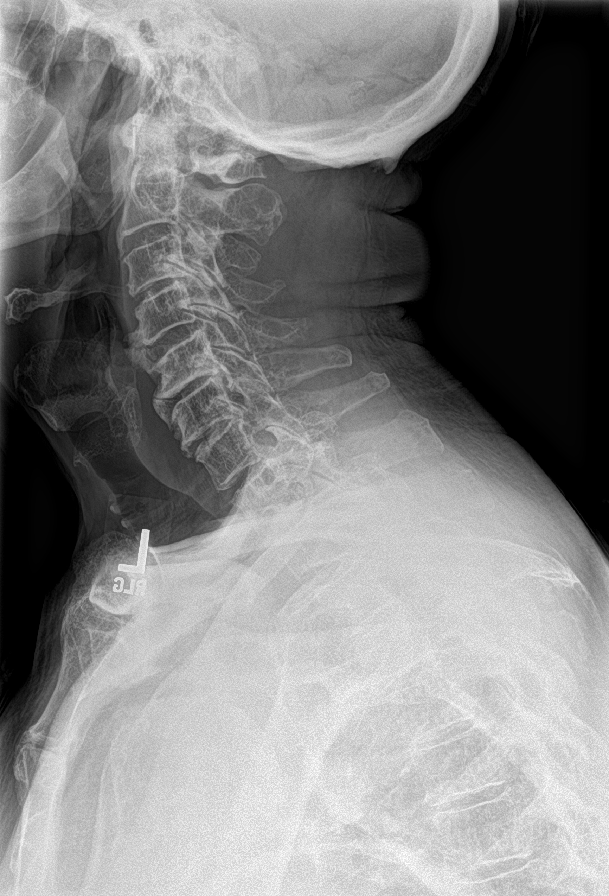

[9 of 10 positions shown; findings below may reference images not displayed]

FINDINGS: Standard skeletal survey was performed.

Lateral skull: No acute or destructive bony lesions.

Upper extremities: Frontal views of the bilateral upper extremities
are obtained from the shoulders through the wrists. Multiple small
rounded lytic lesions are seen within the bilateral humeri and
scapula, consistent with myeloma. No acute fractures.

Spine: Frontal and lateral views of the spine are obtained.
Alignment is grossly anatomic. Chronic vertebral plana at the T12
level. No acute fractures. No destructive bony lesions. Bones are
osteopenic. Extensive multilevel cervical spondylosis.

Chest: Unremarkable cardiac silhouette. Chronic elevation left
hemidiaphragm. Lungs are clear. Chronic healed bilateral rib
fractures. No destructive rib lesion.

Pelvis: Frontal view the pelvis demonstrates numerous small lytic
lesions throughout the bilateral iliac bones consistent with
multiple myeloma. No acute fracture. Radiotherapy seeds within the
prostate.

Lower extremities: Frontal views of the bilateral lower extremities
are obtained from the hips through the ankles. There are multiple
small rounded lytic lesions within the bilateral proximal femurs
consistent with multiple myeloma. No other acute bony abnormalities.
IMPRESSION: 1. Multiple small rounded lytic lesions involving the bilateral
humeri, bilateral femurs, bilateral scapula, and bony pelvis
compatible with multiple myeloma.
2. Chronic T12 compression fracture and resulting vertebra plana.

## 2023-02-15 ENCOUNTER — Ambulatory Visit (INDEPENDENT_AMBULATORY_CARE_PROVIDER_SITE_OTHER): Payer: Medicare Other

## 2023-02-15 DIAGNOSIS — I442 Atrioventricular block, complete: Secondary | ICD-10-CM

## 2023-02-16 LAB — CUP PACEART REMOTE DEVICE CHECK
Battery Remaining Longevity: 62 mo
Battery Remaining Percentage: 95.5 %
Battery Voltage: 3.01 V
Brady Statistic AP VP Percent: 69 %
Brady Statistic AP VS Percent: 1 %
Brady Statistic AS VP Percent: 31 %
Brady Statistic AS VS Percent: 1 %
Brady Statistic RA Percent Paced: 68 %
Brady Statistic RV Percent Paced: 99 %
Date Time Interrogation Session: 20240318020012
Implantable Lead Connection Status: 753985
Implantable Lead Connection Status: 753985
Implantable Lead Implant Date: 20231215
Implantable Lead Implant Date: 20231215
Implantable Lead Location: 753859
Implantable Lead Location: 753860
Implantable Lead Model: 1944
Implantable Lead Model: 1948
Implantable Pulse Generator Implant Date: 20231215
Lead Channel Impedance Value: 550 Ohm
Lead Channel Impedance Value: 590 Ohm
Lead Channel Pacing Threshold Amplitude: 0.5 V
Lead Channel Pacing Threshold Amplitude: 0.75 V
Lead Channel Pacing Threshold Pulse Width: 0.5 ms
Lead Channel Pacing Threshold Pulse Width: 0.5 ms
Lead Channel Sensing Intrinsic Amplitude: 11.2 mV
Lead Channel Sensing Intrinsic Amplitude: 3.4 mV
Lead Channel Setting Pacing Amplitude: 3.5 V
Lead Channel Setting Pacing Amplitude: 3.5 V
Lead Channel Setting Pacing Pulse Width: 0.5 ms
Lead Channel Setting Sensing Sensitivity: 4 mV
Pulse Gen Model: 2272
Pulse Gen Serial Number: 8129463

## 2023-02-17 ENCOUNTER — Encounter: Payer: Self-pay | Admitting: Internal Medicine

## 2023-02-17 ENCOUNTER — Ambulatory Visit: Payer: Medicare Other | Attending: Internal Medicine | Admitting: Internal Medicine

## 2023-02-17 VITALS — BP 110/64 | HR 60 | Ht 71.0 in | Wt 174.6 lb

## 2023-02-17 DIAGNOSIS — Z950 Presence of cardiac pacemaker: Secondary | ICD-10-CM | POA: Insufficient documentation

## 2023-02-17 DIAGNOSIS — I442 Atrioventricular block, complete: Secondary | ICD-10-CM | POA: Insufficient documentation

## 2023-02-17 NOTE — Patient Instructions (Signed)
Medication Instructions:  Your physician recommends that you continue on your current medications as directed. Please refer to the Current Medication list given to you today.  *If you need a refill on your cardiac medications before your next appointment, please call your pharmacy*   Lab Work: None ordered.  If you have labs (blood work) drawn today and your tests are completely normal, you will receive your results only by: MyChart Message (if you have MyChart) OR A paper copy in the mail If you have any lab test that is abnormal or we need to change your treatment, we will call you to review the results.   Testing/Procedures: None ordered.    Follow-Up: At [facility], you and your health needs are our priority.  As part of our continuing mission to provide you with exceptional heart care, we have created designated Provider Care Teams.  These Care Teams include your primary Cardiologist (physician) and Advanced Practice Providers (APPs -  Physician Assistants and Nurse Practitioners) who all work together to provide you with the care you need, when you need it.  We recommend signing up for the patient portal called "MyChart".  Sign up information is provided on this After Visit Summary.  MyChart is used to connect with patients for Virtual Visits (Telemedicine).  Patients are able to view lab/test results, encounter notes, upcoming appointments, etc.  Non-urgent messages can be sent to your provider as well.   To learn more about what you can do with MyChart, go to [email or web address].    Your next appointment:   9 months with Dr Klein 

## 2023-02-17 NOTE — Progress Notes (Signed)
Patient Care Team: [name], MD as PCP - General (Internal Medicine) Deboraha Sprang, MD as PCP - Electrophysiology (Cardiology) Iantha Fallen   HPI  Joe [name] is a 89 y.o. male seen in follow-up for pacemaker-[employer] implanted 12/23 for high-grade heart block symptomatic identified at the cancer center where he was followed for multiple myeloma  Significantly better following pacemaker implantation with less lightheadedness less shortness of breath.  Still mild lightheadedness when he stands up too quickly  DATE TEST EF   12/23 Echo   60-65 %              Date Cr K Hgb  2/24 1.21 4.3 9.0            Records and Results Reviewed    Past Medical History:  Diagnosis Date   Enlarged prostate    Hypertension    Melanoma ([place]) 2021   Multiple myeloma ([place])     Past Surgical History:  Procedure Laterality Date   EYE SURGERY     PACEMAKER IMPLANT N/A 11/13/2022   Procedure: PACEMAKER IMPLANT;  Surgeon: Deboraha Sprang, MD;  Location: [facility];  Service: Cardiovascular;  Laterality: N/A;   PROSTATE SURGERY      Current Meds  Medication Sig   acyclovir (ZOVIRAX) 400 MG tablet Take 1 tablet (400 mg total) by mouth 2 (two) times daily.   allopurinol (ZYLOPRIM) 100 MG tablet Take 100 mg by mouth daily.   amLODipine (NORVASC) 5 MG tablet Take 5 mg by mouth daily.   aspirin EC 81 MG tablet Take 81 mg by mouth daily. Swallow whole.    calcium carbonate (TUMS - DOSED IN MG ELEMENTAL CALCIUM) 500 MG chewable tablet Chew 3 tablets by mouth daily.   doxazosin (CARDURA) 4 MG tablet Take 4 mg by mouth at bedtime.   famotidine (PEPCID) 20 MG tablet Take 1 tablet by mouth once daily   finasteride (PROSCAR) 5 MG tablet Take 1 tablet (5 mg total) by mouth daily.   fosinopril (MONOPRIL) 40 MG tablet Take 40 mg by mouth daily.   gabapentin (NEURONTIN) 100 MG capsule Take 1 capsule (100 mg total) by mouth 3 (three) times daily.   indapamide  (LOZOL) 2.5 MG tablet Take 2.5 mg by mouth daily.    lenalidomide (REVLIMID) 10 MG capsule Take 1 tablet daily for 21 days then 7 days off every 28 days   Magnesium Oxide 400 MG CAPS Take 400 mg by mouth twice a day for 2 weeks then reduce back to 400 mg daily   ondansetron (ZOFRAN) 8 MG tablet TAKE 1 TABLET BY MOUTH THREE TIMES DAILY AS NEEDED FOR NAUSEA OR VOMITING (Patient taking differently: Take 8 mg by mouth every 8 (eight) hours as needed for nausea or vomiting.)   potassium chloride (KLOR-CON) 10 MEQ tablet Take 2 tablets by mouth once daily   pravastatin (PRAVACHOL) 40 MG tablet Take 40 mg by mouth daily.   prednisoLONE acetate (PRED FORTE) 1 % ophthalmic suspension Place 1 drop into the left eye 2 (two) times daily.   tamsulosin (FLOMAX) 0.4 MG CAPS capsule TAKE 1 CAPSULE BY MOUTH ONCE DAILY AFTER SUPPER    No Known Allergies    Review of Systems negative except from HPI and PMH  Physical Exam BP 110/64   Pulse 60   Ht 5\' 11"  (1.803 m)   Wt 174 lb 9.6 oz (79.2 kg)   SpO2 97%   BMI 24.35  kg/m  Well developed and well nourished in no acute distress HENT normal E scleral and icterus clear Neck Supple JVP flat; carotids brisk and full Clear to auscultation Device pocket well healed; without hematoma or erythema.  There is no tethering Regular rate and rhythm, no murmurs gallops or rub Soft with active bowel sounds No clubbing cyanosis  Edema Alert and oriented, grossly normal motor and sensory function Skin Warm and Dry  ECG AV pacing  CrCl cannot be calculated (Patient's most recent lab result is older than the maximum 21 days allowed.).   Assessment and  Plan Complete heart block, relatively narrow QRS withsome competitive wide complex beats   Myeloma   HTN   CKD 3b Estimated Creatinine Clearance: 41.8 mL/min (A) (by C-G formula based on SCr of 1.25 mg/dL (H)).   LV function normal     Much improved  Normal pacemaker function     Current medicines are  reviewed at length with the patient today .  The patient does not have concerns regarding medicines.

## 2023-03-04 ENCOUNTER — Other Ambulatory Visit: Payer: Self-pay

## 2023-03-04 DIAGNOSIS — C9000 Multiple myeloma not having achieved remission: Secondary | ICD-10-CM

## 2023-03-08 ENCOUNTER — Inpatient Hospital Stay: Payer: Medicare Other | Attending: Hematology and Oncology

## 2023-03-08 ENCOUNTER — Other Ambulatory Visit: Payer: Self-pay

## 2023-03-08 ENCOUNTER — Other Ambulatory Visit: Payer: Self-pay | Admitting: *Deleted

## 2023-03-08 ENCOUNTER — Inpatient Hospital Stay (HOSPITAL_BASED_OUTPATIENT_CLINIC_OR_DEPARTMENT_OTHER): Payer: Medicare Other | Admitting: Hematology and Oncology

## 2023-03-08 VITALS — BP 126/54 | HR 84 | Temp 97.7°F | Resp 16 | Ht 71.0 in | Wt 177.1 lb

## 2023-03-08 DIAGNOSIS — C9000 Multiple myeloma not having achieved remission: Secondary | ICD-10-CM

## 2023-03-08 DIAGNOSIS — Z79899 Other long term (current) drug therapy: Secondary | ICD-10-CM | POA: Insufficient documentation

## 2023-03-08 DIAGNOSIS — Z85820 Personal history of malignant melanoma of skin: Secondary | ICD-10-CM | POA: Diagnosis not present

## 2023-03-08 DIAGNOSIS — Z950 Presence of cardiac pacemaker: Secondary | ICD-10-CM | POA: Diagnosis not present

## 2023-03-08 LAB — CBC WITH DIFFERENTIAL (CANCER CENTER ONLY)
Abs Immature Granulocytes: 0 10*3/uL (ref 0.00–0.07)
Basophils Absolute: 0 10*3/uL (ref 0.0–0.1)
Basophils Relative: 1 %
Eosinophils Absolute: 0.1 10*3/uL (ref 0.0–0.5)
Eosinophils Relative: 2 %
HCT: 30.8 % — ABNORMAL LOW (ref 39.0–52.0)
Hemoglobin: 9.9 g/dL — ABNORMAL LOW (ref 13.0–17.0)
Immature Granulocytes: 0 %
Lymphocytes Relative: 31 %
Lymphs Abs: 1.1 10*3/uL (ref 0.7–4.0)
MCH: 27.6 pg (ref 26.0–34.0)
MCHC: 32.1 g/dL (ref 30.0–36.0)
MCV: 85.8 fL (ref 80.0–100.0)
Monocytes Absolute: 0.3 10*3/uL (ref 0.1–1.0)
Monocytes Relative: 8 %
Neutro Abs: 2.1 10*3/uL (ref 1.7–7.7)
Neutrophils Relative %: 58 %
Platelet Count: 120 10*3/uL — ABNORMAL LOW (ref 150–400)
RBC: 3.59 MIL/uL — ABNORMAL LOW (ref 4.22–5.81)
RDW: 14 % (ref 11.5–15.5)
WBC Count: 3.6 10*3/uL — ABNORMAL LOW (ref 4.0–10.5)
nRBC: 0 % (ref 0.0–0.2)

## 2023-03-08 LAB — CMP (CANCER CENTER ONLY)
ALT: 8 U/L (ref 0–44)
AST: 15 U/L (ref 15–41)
Albumin: 3.8 g/dL (ref 3.5–5.0)
Alkaline Phosphatase: 44 U/L (ref 38–126)
Anion gap: 5 (ref 5–15)
BUN: 26 mg/dL — ABNORMAL HIGH (ref 8–23)
CO2: 27 mmol/L (ref 22–32)
Calcium: 9.3 mg/dL (ref 8.9–10.3)
Chloride: 106 mmol/L (ref 98–111)
Creatinine: 1.24 mg/dL (ref 0.61–1.24)
GFR, Estimated: 55 mL/min — ABNORMAL LOW (ref >60)
Glucose, Bld: 107 mg/dL — ABNORMAL HIGH (ref 70–99)
Potassium: 4 mmol/L (ref 3.5–5.1)
Sodium: 138 mmol/L (ref 135–145)
Total Bilirubin: 0.5 mg/dL (ref 0.3–1.2)
Total Protein: 6.8 g/dL (ref 6.5–8.1)

## 2023-03-08 NOTE — Progress Notes (Signed)
[facility]  [phone] [phone]     ID: Joe [name] DOB: 1929-10-15  Joe#: [id number]  JYN#:[id number]  Patient Care Team: Georgianne Fick, MD as PCP - General (Internal Medicine) Duke Salvia, MD as PCP - Electrophysiology (Cardiology) Long-Stokes, Idamae Schuller, MD  CHIEF COMPLAINT: multiple myeloma  CURRENT TREATMENT: denosumab Xgeva every 12 weeks; lenalidomide 10 mg 21/7  INTERVAL HISTORY:  Patient is here for follow-up. He is feeling well overall, fatigue has nearly resolved. He is staying active over all. Here and there, he started noticing some pain in bilateral flanks/hip when he is doing some activity or when he wakes up. It gets better during the day. No new bone pains otherwise. NO B symptoms.   No change in breathing or bowel habits or urinary habits.  Rest of the pertinent 10 point ROS reviewed and negative.  Lab Results  Component Value Date   KAPLAMBRATIO 2.23 (H) 12/28/2022   KAPLAMBRATIO 2.19 (H) 09/17/2022   KAPLAMBRATIO 2.25 (H) 07/28/2022   KAPLAMBRATIO 2.40 (H) 06/25/2022   KAPLAMBRATIO 2.41 (H) 05/25/2022    REVIEW OF SYSTEMS:  Rest of the pertinent 10 point ROS reviewed and negative   COVID 19 VACCINATION STATUS: received Pfizer x2 with booster October 2021.  He received evusheld on 03/20/2021 and is due a second dose   HISTORY OF CURRENT ILLNESS:  From the original intake note:  Joe. Bartell is an 89 y/o [place] man formerly followed by my partner Dr Arbutus Ped for M-GUS. We have an M-Spike of 0.87 documented 07/12/2012. [name] kappa/lambda light chains and total IgG were follower yearly until 01/2016, when they were 2.36 and 1517 respectively (unremarkable). He was lost to follow-up after that point.  [02/29/2020] the patient presented to Urgent Care with poorly controlled pain. This was felt to be mussculoskeletal and he was started on a medrol dose-pak and robaxin. However his pain worsened and he  presented to the ED at [facility] where vitals and exam were unremarkable but labs showed a creatinine of 1.84 (baseline 1.0), calcium 12.7 with albumin 2.8 and total protein 10.4 (globulin fraction 7.8).  CT of the abdomen obtained for evaluation of his abdominal and back pain showed no hydronephrosis but multiple lytic lesons.   The patient's subsequent history is as detailed below.  PAST MEDICAL HISTORY: Past Medical History:  Diagnosis Date   Enlarged prostate    Hypertension    Melanoma (HCC) 2021   Multiple myeloma (HCC)     PAST SURGICAL HISTORY: Past Surgical History:  Procedure Laterality Date   EYE SURGERY     PACEMAKER IMPLANT N/A 11/13/2022   Procedure: PACEMAKER IMPLANT;  Surgeon: Duke Salvia, MD;  Location: [facility] INVASIVE CV LAB;  Service: Cardiovascular;  Laterality: N/A;   PROSTATE SURGERY      FAMILY HISTORY Family History  Problem Relation Age of Onset   Hypertension Mother     SOCIAL HISTORY:  Retired, used to work for the [employer]. Lives with wife [place]. Has 4 children, 2 in [place], one in [place], one in [place]; 5 grandchildren and 5 great-grandchildren. Attends a [employer].    ADVANCED DIRECTIVES: at the initial visit the patient confirmed his wife is his HCPOA (despite her memory issues).   HEALTH MAINTENANCE: Social History   Tobacco Use   Smoking status: Never   Smokeless tobacco: Never  Vaping Use   Vaping Use: Never used  Substance Use Topics   Alcohol use: Never  Drug use: Never     Colonoscopy: 6-8 years ago     No Known Allergies  Current Outpatient Medications  Medication Sig Dispense Refill   acyclovir (ZOVIRAX) 400 MG tablet Take 1 tablet (400 mg total) by mouth 2 (two) times daily. 180 tablet 3   allopurinol (ZYLOPRIM) 100 MG tablet Take 100 mg by mouth daily.     amLODipine (NORVASC) 5 MG tablet Take 5 mg by mouth daily.     aspirin EC 81 MG tablet Take 81 mg by mouth  daily. Swallow whole.      calcium carbonate (TUMS - DOSED IN MG ELEMENTAL CALCIUM) 500 MG chewable tablet Chew 3 tablets by mouth daily.     doxazosin (CARDURA) 4 MG tablet Take 4 mg by mouth at bedtime.     famotidine (PEPCID) 20 MG tablet Take 1 tablet by mouth once daily 90 tablet 0   finasteride (PROSCAR) 5 MG tablet Take 1 tablet (5 mg total) by mouth daily. 30 tablet 3   fosinopril (MONOPRIL) 40 MG tablet Take 40 mg by mouth daily.     gabapentin (NEURONTIN) 100 MG capsule Take 1 capsule (100 mg total) by mouth 3 (three) times daily. 90 capsule 4   indapamide (LOZOL) 2.5 MG tablet Take 2.5 mg by mouth daily.      lenalidomide (REVLIMID) 10 MG capsule Take 1 tablet daily for 21 days then 7 days off every 28 days 21 capsule 0   Magnesium Oxide 400 MG CAPS Take 400 mg by mouth twice a day for 2 weeks then reduce back to 400 mg daily 180 capsule 1   ondansetron (ZOFRAN) 8 MG tablet TAKE 1 TABLET BY MOUTH THREE TIMES DAILY AS NEEDED FOR NAUSEA OR VOMITING (Patient taking differently: Take 8 mg by mouth every 8 (eight) hours as needed for nausea or vomiting.) 20 tablet 0   potassium chloride (KLOR-CON) 10 MEQ tablet Take 2 tablets by mouth once daily 180 tablet 0   pravastatin (PRAVACHOL) 40 MG tablet Take 40 mg by mouth daily.     prednisoLONE acetate (PRED FORTE) 1 % ophthalmic suspension Place 1 drop into the left eye 2 (two) times daily.     tamsulosin (FLOMAX) 0.4 MG CAPS capsule TAKE 1 CAPSULE BY MOUTH ONCE DAILY AFTER SUPPER 90 capsule 0   No current facility-administered medications for this visit.    OBJECTIVE: African-American man in no acute distress Vitals:   03/08/23 1302  BP: (!) 126/54  Pulse: 84  Resp: 16  Temp: 97.7 F (36.5 C)  SpO2: 100%         Body mass index is 24.7 kg/m.   Wt Readings from Last 3 Encounters:  03/08/23 177 lb 1.6 oz (80.3 kg)  02/17/23 174 lb 9.6 oz (79.2 kg)  01/11/23 171 lb 1.6 oz (77.6 kg)  ECOG FS:1 - Symptomatic but completely  ambulatory  Physical Exam Constitutional:      Appearance: Normal appearance.  HENT:     Head: Normocephalic and atraumatic.  Cardiovascular:     Rate and Rhythm: Normal rate and regular rhythm.  Abdominal:     General: Abdomen is flat.     Palpations: Abdomen is soft.  Musculoskeletal:        General: No swelling or tenderness.     Cervical back: Normal range of motion and neck supple.  Neurological:     General: No focal deficit present.     Mental Status: He is alert.  Psychiatric:  Mood and Affect: Mood normal.      LAB RESULTS:  CMP     Component Value Date/Time   NA 139 01/11/2023 1316   NA 137 12/01/2022 1337   NA 140 01/09/2016 1045   K 4.3 01/11/2023 1316   K 3.8 01/09/2016 1045   CL 107 01/11/2023 1316   CO2 28 01/11/2023 1316   CO2 27 01/09/2016 1045   GLUCOSE 81 01/11/2023 1316   GLUCOSE 89 01/09/2016 1045   BUN 23 01/11/2023 1316   BUN 28 12/01/2022 1337   BUN 14.7 01/09/2016 1045   CREATININE 1.21 01/11/2023 1316   CREATININE 1.16 12/28/2022 1325   CREATININE 1.0 01/09/2016 1045   CALCIUM 9.0 01/11/2023 1316   CALCIUM 9.2 01/09/2016 1045   PROT 6.8 01/11/2023 1316   PROT 7.3 01/09/2016 1045   ALBUMIN 3.7 01/11/2023 1316   ALBUMIN 3.7 01/09/2016 1045   AST 14 (L) 01/11/2023 1316   AST 15 12/28/2022 1325   AST 18 01/09/2016 1045   ALT 9 01/11/2023 1316   ALT 13 12/28/2022 1325   ALT 15 01/09/2016 1045   ALKPHOS 48 01/11/2023 1316   ALKPHOS 60 01/09/2016 1045   BILITOT 0.6 01/11/2023 1316   BILITOT 0.5 12/28/2022 1325   BILITOT 0.51 01/09/2016 1045   GFRNONAA 57 (L) 01/11/2023 1316   GFRNONAA 59 (L) 12/28/2022 1325   GFRAA >60 08/30/2020 1402    Lab Results  Component Value Date   TOTALPROTELP 6.2 12/28/2022   ALBUMINELP 3.4 12/28/2022   A1GS 0.2 12/28/2022   A2GS 0.6 12/28/2022   BETS 0.7 12/28/2022   BETA2SER 3.6 07/12/2012   GAMS 1.3 12/28/2022   MSPIKE Not Observed 12/28/2022   SPEI * 07/12/2012     Lab Results   Component Value Date   KPAFRELGTCHN 72.1 (H) 12/28/2022   LAMBDASER 32.3 (H) 12/28/2022   KAPLAMBRATIO 2.23 (H) 12/28/2022    Lab Results  Component Value Date   WBC 3.6 (L) 03/08/2023   NEUTROABS 2.1 03/08/2023   HGB 9.9 (L) 03/08/2023   HCT 30.8 (L) 03/08/2023   MCV 85.8 03/08/2023   PLT 120 (L) 03/08/2023      Chemistry      Component Value Date/Time   NA 139 01/11/2023 1316   NA 137 12/01/2022 1337   NA 140 01/09/2016 1045   K 4.3 01/11/2023 1316   K 3.8 01/09/2016 1045   CL 107 01/11/2023 1316   CO2 28 01/11/2023 1316   CO2 27 01/09/2016 1045   BUN 23 01/11/2023 1316   BUN 28 12/01/2022 1337   BUN 14.7 01/09/2016 1045   CREATININE 1.21 01/11/2023 1316   CREATININE 1.16 12/28/2022 1325   CREATININE 1.0 01/09/2016 1045      Component Value Date/Time   CALCIUM 9.0 01/11/2023 1316   CALCIUM 9.2 01/09/2016 1045   ALKPHOS 48 01/11/2023 1316   ALKPHOS 60 01/09/2016 1045   AST 14 (L) 01/11/2023 1316   AST 15 12/28/2022 1325   AST 18 01/09/2016 1045   ALT 9 01/11/2023 1316   ALT 13 12/28/2022 1325   ALT 15 01/09/2016 1045   BILITOT 0.6 01/11/2023 1316   BILITOT 0.5 12/28/2022 1325   BILITOT 0.51 01/09/2016 1045       No results found for: "LABCA2"  No components found for: "JGGEZM629"  No results for input(s): "INR" in the last 168 hours.  No results found for: "LABCA2"  No results found for: "UTM546"  No results found for: "CAN125"  No  results found for: "CAN153"  No results found for: "CA2729"  No components found for: "HGQUANT"  No results found for: "CEA1", "CEA" / No results found for: "CEA1", "CEA"   No results found for: "AFPTUMOR"  No results found for: "CHROMOGRNA"  No results found for: "HGBA", "HGBA2QUANT", "HGBFQUANT", "HGBSQUAN" (Hemoglobinopathy evaluation)   Lab Results  Component Value Date   LDH 136 01/09/2016    Lab Results  Component Value Date   IRON 28 (L) 03/26/2022   TIBC 304 03/26/2022   IRONPCTSAT 9 (L)  03/26/2022   (Iron and TIBC)  Lab Results  Component Value Date   FERRITIN 142 05/25/2022    Urinalysis    Component Value Date/Time   COLORURINE AMBER (A) 03/01/2020 1052   APPEARANCEUR CLOUDY (A) 03/01/2020 1052   LABSPEC 1.021 03/01/2020 1052   PHURINE 5.0 03/01/2020 1052   GLUCOSEU NEGATIVE 03/01/2020 1052   HGBUR NEGATIVE 03/01/2020 1052   BILIRUBINUR NEGATIVE 03/01/2020 1052   KETONESUR NEGATIVE 03/01/2020 1052   PROTEINUR 100 (A) 03/01/2020 1052   UROBILINOGEN 0.2 02/29/2020 1537   NITRITE NEGATIVE 03/01/2020 1052   LEUKOCYTESUR NEGATIVE 03/01/2020 1052    STUDIES: CUP PACEART REMOTE DEVICE CHECK  Result Date: 02/16/2023 Scheduled remote reviewed. Normal device function.  Next remote 91 days. LA    ELIGIBLE FOR AVAILABLE RESEARCH PROTOCOL:   ASSESSMENT: 89 y.o.  57 [place] man with a history of M-GUS, presenting 02/29/2020 with a high globulin fraction, worsening renal function, hypercalcemia, anemia and multiple lytic bone lesions.    (1) IgG Kappa Multiple Myeloma: labs diagnostic on 03/01/2020 with Mspike of 3.8, Kappa free light chain 690.6, and ratio 117.05.  (a) CT A/P on 03/01/2020 shows "innumerable" lytic lesions involving all visualized bones  (b) bone marrow biopsy 03/11/2020 shows 20% plasmacytosis by CD138, 12% by manual differential; molecular studies not requested  (c) Bortezomib and Dexamethasone given weekly 3 weeks on and 1 week off beginning 03/11/2020, with good response, last dose 07/24/2020  (d) transitioned to lenalidomide 10 mg daily 21/7 starting 07/31/2020    (2) lytic bone lesions/ hypercalcemia:  (a) Pamidronate administered on 03/01/2020  (b) denosumab/Xgeva started 04/17/2020-- repeat every 12 weeks   (a) to take TUMS 3 tabs on day of Xgeva dose  PLAN:   Joe [name] is here for follow up. He denies any new complaints today No concerns on exam Labs from today show no concern for myeloma relapse, however myeloma labs are pending We plan  to continue holding revelimid at this time. He has been getting xgeva every 12 weeks since 2021. No new dental concerns reported. RTC in 8 weeks for FU and labs.  Total time spent: 30 min  *Total Encounter Time as defined by the Centers for Medicare and Medicaid Services includes, in addition to the face-to-face time of a patient visit (documented in the note above) non-face-to-face time: obtaining and reviewing outside history, ordering and reviewing medications, tests or procedures, care coordination (communications with other health care professionals or caregivers) and documentation in the medical record.

## 2023-03-09 ENCOUNTER — Telehealth: Payer: Self-pay | Admitting: Hematology and Oncology

## 2023-03-09 LAB — KAPPA/LAMBDA LIGHT CHAINS
Kappa free light chain: 32.6 mg/L — ABNORMAL HIGH (ref 3.3–19.4)
Kappa, lambda light chain ratio: 2.28 — ABNORMAL HIGH (ref 0.26–1.65)
Lambda free light chains: 14.3 mg/L (ref 5.7–26.3)

## 2023-03-09 NOTE — Telephone Encounter (Signed)
Spoke with patient confirming upcoming appointments  

## 2023-03-10 LAB — IGG, IGA, IGM
IgA: 213 mg/dL (ref 61–437)
IgG (Immunoglobin G), Serum: 1465 mg/dL (ref 603–1613)
IgM (Immunoglobulin M), Srm: 45 mg/dL (ref 15–143)

## 2023-03-17 ENCOUNTER — Inpatient Hospital Stay: Payer: Medicare Other

## 2023-03-17 ENCOUNTER — Other Ambulatory Visit: Payer: Self-pay

## 2023-03-17 DIAGNOSIS — Z950 Presence of cardiac pacemaker: Secondary | ICD-10-CM | POA: Diagnosis not present

## 2023-03-17 DIAGNOSIS — Z7189 Other specified counseling: Secondary | ICD-10-CM

## 2023-03-17 DIAGNOSIS — Z79899 Other long term (current) drug therapy: Secondary | ICD-10-CM | POA: Diagnosis not present

## 2023-03-17 DIAGNOSIS — C9000 Multiple myeloma not having achieved remission: Secondary | ICD-10-CM

## 2023-03-17 DIAGNOSIS — Z85820 Personal history of malignant melanoma of skin: Secondary | ICD-10-CM | POA: Diagnosis not present

## 2023-03-17 MED ORDER — DENOSUMAB 120 MG/1.7ML ~~LOC~~ SOLN
120.0000 mg | Freq: Once | SUBCUTANEOUS | Status: AC
  Administered 2023-03-17: 120 mg via SUBCUTANEOUS
  Filled 2023-03-17: qty 1.7

## 2023-03-17 NOTE — Patient Instructions (Signed)
Denosumab Injection (Oncology) What is this medication? DENOSUMAB (den oh SUE mab) prevents weakened bones caused by cancer. It may also be used to treat noncancerous bone tumors that cannot be removed by surgery. It can also be used to treat high calcium levels in the blood caused by cancer. It works by blocking a protein that causes bones to break down quickly. This slows down the release of calcium from bones, which lowers calcium levels in your blood. It also makes your bones stronger and less likely to break (fracture). This medicine may be used for other purposes; ask your health care provider or pharmacist if you have questions. COMMON BRAND NAME(S): XGEVA What should I tell my care team before I take this medication? They need to know if you have any of these conditions: Dental disease Having surgery or tooth extraction Infection Kidney disease Low levels of calcium or vitamin D in the blood Malnutrition On hemodialysis Skin conditions or sensitivity Thyroid or parathyroid disease An unusual reaction to denosumab, other medications, foods, dyes, or preservatives Pregnant or trying to get pregnant Breast-feeding How should I use this medication? This medication is for injection under the skin. It is given by your care team in a hospital or clinic setting. A special MedGuide will be given to you before each treatment. Be sure to read this information carefully each time. Talk to your care team about the use of this medication in children. While it may be prescribed for children as young as 13 years for selected conditions, precautions do apply. Overdosage: If you think you have taken too much of this medicine contact a poison control center or emergency room at once. NOTE: This medicine is only for you. Do not share this medicine with others. What if I miss a dose? Keep appointments for follow-up doses. It is important not to miss your dose. Call your care team if you are unable to  keep an appointment. What may interact with this medication? Do not take this medication with any of the following: Other medications containing denosumab This medication may also interact with the following: Medications that lower your chance of fighting infection Steroid medications, such as prednisone or cortisone This list may not describe all possible interactions. Give your health care provider a list of all the medicines, herbs, non-prescription drugs, or dietary supplements you use. Also tell them if you smoke, drink alcohol, or use illegal drugs. Some items may interact with your medicine. What should I watch for while using this medication? Your condition will be monitored carefully while you are receiving this medication. You may need blood work while taking this medication. This medication may increase your risk of getting an infection. Call your care team for advice if you get a fever, chills, sore throat, or other symptoms of a cold or flu. Do not treat yourself. Try to avoid being around people who are sick. You should make sure you get enough calcium and vitamin D while you are taking this medication, unless your care team tells you not to. Discuss the foods you eat and the vitamins you take with your care team. Some people who take this medication have severe bone, joint, or muscle pain. This medication may also increase your risk for jaw problems or a broken thigh bone. Tell your care team right away if you have severe pain in your jaw, bones, joints, or muscles. Tell your care team if you have any pain that does not go away or that gets worse. Talk   to your care team if you may be pregnant. Serious birth defects can occur if you take this medication during pregnancy and for 5 months after the last dose. You will need a negative pregnancy test before starting this medication. Contraception is recommended while taking this medication and for 5 months after the last dose. Your care team  can help you find the option that works for you. What side effects may I notice from receiving this medication? Side effects that you should report to your care team as soon as possible: Allergic reactions--skin rash, itching, hives, swelling of the face, lips, tongue, or throat Bone, joint, or muscle pain Low calcium level--muscle pain or cramps, confusion, tingling, or numbness in the hands or feet Osteonecrosis of the jaw--pain, swelling, or redness in the mouth, numbness of the jaw, poor healing after dental work, unusual discharge from the mouth, visible bones in the mouth Side effects that usually do not require medical attention (report to your care team if they continue or are bothersome): Cough Diarrhea Fatigue Headache Nausea This list may not describe all possible side effects. Call your doctor for medical advice about side effects. You may report side effects to FDA at 1-800-FDA-1088. Where should I keep my medication? This medication is given in a hospital or clinic. It will not be stored at home. NOTE: This sheet is a summary. It may not cover all possible information. If you have questions about this medicine, talk to your doctor, pharmacist, or health care provider.  2023 Elsevier/Gold Standard (2022-04-06 00:00:00)  

## 2023-03-21 ENCOUNTER — Other Ambulatory Visit: Payer: Self-pay | Admitting: Hematology and Oncology

## 2023-03-22 ENCOUNTER — Encounter: Payer: Self-pay | Admitting: Oncology

## 2023-03-25 DIAGNOSIS — I10 Essential (primary) hypertension: Secondary | ICD-10-CM | POA: Diagnosis not present

## 2023-03-25 DIAGNOSIS — R5383 Other fatigue: Secondary | ICD-10-CM | POA: Diagnosis not present

## 2023-03-25 DIAGNOSIS — C9000 Multiple myeloma not having achieved remission: Secondary | ICD-10-CM | POA: Diagnosis not present

## 2023-03-25 DIAGNOSIS — D472 Monoclonal gammopathy: Secondary | ICD-10-CM | POA: Diagnosis not present

## 2023-03-25 DIAGNOSIS — D509 Iron deficiency anemia, unspecified: Secondary | ICD-10-CM | POA: Diagnosis not present

## 2023-03-25 DIAGNOSIS — E785 Hyperlipidemia, unspecified: Secondary | ICD-10-CM | POA: Diagnosis not present

## 2023-03-25 DIAGNOSIS — D61818 Other pancytopenia: Secondary | ICD-10-CM | POA: Diagnosis not present

## 2023-03-26 NOTE — Progress Notes (Signed)
Remote pacemaker transmission.   

## 2023-04-01 DIAGNOSIS — D509 Iron deficiency anemia, unspecified: Secondary | ICD-10-CM | POA: Diagnosis not present

## 2023-04-01 DIAGNOSIS — Z0000 Encounter for general adult medical examination without abnormal findings: Secondary | ICD-10-CM | POA: Diagnosis not present

## 2023-04-01 DIAGNOSIS — M1A09X0 Idiopathic chronic gout, multiple sites, without tophus (tophi): Secondary | ICD-10-CM | POA: Diagnosis not present

## 2023-04-01 DIAGNOSIS — C9000 Multiple myeloma not having achieved remission: Secondary | ICD-10-CM | POA: Diagnosis not present

## 2023-04-01 DIAGNOSIS — D472 Monoclonal gammopathy: Secondary | ICD-10-CM | POA: Diagnosis not present

## 2023-04-01 DIAGNOSIS — D61818 Other pancytopenia: Secondary | ICD-10-CM | POA: Diagnosis not present

## 2023-04-01 DIAGNOSIS — G609 Hereditary and idiopathic neuropathy, unspecified: Secondary | ICD-10-CM | POA: Diagnosis not present

## 2023-04-01 DIAGNOSIS — N400 Enlarged prostate without lower urinary tract symptoms: Secondary | ICD-10-CM | POA: Diagnosis not present

## 2023-04-01 DIAGNOSIS — I10 Essential (primary) hypertension: Secondary | ICD-10-CM | POA: Diagnosis not present

## 2023-04-01 DIAGNOSIS — E785 Hyperlipidemia, unspecified: Secondary | ICD-10-CM | POA: Diagnosis not present

## 2023-04-10 ENCOUNTER — Other Ambulatory Visit: Payer: Self-pay | Admitting: Hematology and Oncology

## 2023-04-10 DIAGNOSIS — C9000 Multiple myeloma not having achieved remission: Secondary | ICD-10-CM

## 2023-04-12 ENCOUNTER — Other Ambulatory Visit: Payer: Self-pay | Admitting: Hematology and Oncology

## 2023-04-16 ENCOUNTER — Other Ambulatory Visit: Payer: Self-pay | Admitting: Hematology and Oncology

## 2023-04-27 ENCOUNTER — Other Ambulatory Visit: Payer: Self-pay | Admitting: Hematology and Oncology

## 2023-05-03 ENCOUNTER — Other Ambulatory Visit: Payer: Self-pay

## 2023-05-03 DIAGNOSIS — C9000 Multiple myeloma not having achieved remission: Secondary | ICD-10-CM

## 2023-05-04 ENCOUNTER — Other Ambulatory Visit: Payer: Self-pay

## 2023-05-04 ENCOUNTER — Inpatient Hospital Stay (HOSPITAL_BASED_OUTPATIENT_CLINIC_OR_DEPARTMENT_OTHER): Payer: Medicare Other | Admitting: Hematology and Oncology

## 2023-05-04 ENCOUNTER — Other Ambulatory Visit: Payer: Self-pay | Admitting: *Deleted

## 2023-05-04 ENCOUNTER — Inpatient Hospital Stay: Payer: Medicare Other | Attending: Hematology and Oncology

## 2023-05-04 ENCOUNTER — Encounter: Payer: Self-pay | Admitting: Hematology and Oncology

## 2023-05-04 VITALS — BP 131/57 | HR 67 | Temp 97.5°F | Resp 18 | Ht 71.0 in | Wt 178.6 lb

## 2023-05-04 DIAGNOSIS — Z79899 Other long term (current) drug therapy: Secondary | ICD-10-CM | POA: Insufficient documentation

## 2023-05-04 DIAGNOSIS — Z7961 Long term (current) use of immunomodulator: Secondary | ICD-10-CM | POA: Insufficient documentation

## 2023-05-04 DIAGNOSIS — Z79624 Long term (current) use of inhibitors of nucleotide synthesis: Secondary | ICD-10-CM | POA: Diagnosis not present

## 2023-05-04 DIAGNOSIS — C9001 Multiple myeloma in remission: Secondary | ICD-10-CM

## 2023-05-04 DIAGNOSIS — D649 Anemia, unspecified: Secondary | ICD-10-CM | POA: Diagnosis not present

## 2023-05-04 DIAGNOSIS — Z7982 Long term (current) use of aspirin: Secondary | ICD-10-CM | POA: Insufficient documentation

## 2023-05-04 DIAGNOSIS — C9000 Multiple myeloma not having achieved remission: Secondary | ICD-10-CM | POA: Insufficient documentation

## 2023-05-04 LAB — CMP (CANCER CENTER ONLY)
ALT: 8 U/L (ref 0–44)
AST: 15 U/L (ref 15–41)
Albumin: 3.9 g/dL (ref 3.5–5.0)
Alkaline Phosphatase: 43 U/L (ref 38–126)
Anion gap: 5 (ref 5–15)
BUN: 28 mg/dL — ABNORMAL HIGH (ref 8–23)
CO2: 28 mmol/L (ref 22–32)
Calcium: 9.2 mg/dL (ref 8.9–10.3)
Chloride: 103 mmol/L (ref 98–111)
Creatinine: 1.33 mg/dL — ABNORMAL HIGH (ref 0.61–1.24)
GFR, Estimated: 50 mL/min — ABNORMAL LOW (ref >60)
Glucose, Bld: 93 mg/dL (ref 70–99)
Potassium: 4.1 mmol/L (ref 3.5–5.1)
Sodium: 136 mmol/L (ref 135–145)
Total Bilirubin: 0.5 mg/dL (ref 0.3–1.2)
Total Protein: 7 g/dL (ref 6.5–8.1)

## 2023-05-04 LAB — CBC WITH DIFFERENTIAL (CANCER CENTER ONLY)
Abs Immature Granulocytes: 0.01 10*3/uL (ref 0.00–0.07)
Basophils Absolute: 0 10*3/uL (ref 0.0–0.1)
Basophils Relative: 0 %
Eosinophils Absolute: 0.1 10*3/uL (ref 0.0–0.5)
Eosinophils Relative: 2 %
HCT: 32.1 % — ABNORMAL LOW (ref 39.0–52.0)
Hemoglobin: 10.2 g/dL — ABNORMAL LOW (ref 13.0–17.0)
Immature Granulocytes: 0 %
Lymphocytes Relative: 26 %
Lymphs Abs: 1.2 10*3/uL (ref 0.7–4.0)
MCH: 27 pg (ref 26.0–34.0)
MCHC: 31.8 g/dL (ref 30.0–36.0)
MCV: 84.9 fL (ref 80.0–100.0)
Monocytes Absolute: 0.3 10*3/uL (ref 0.1–1.0)
Monocytes Relative: 8 %
Neutro Abs: 2.8 10*3/uL (ref 1.7–7.7)
Neutrophils Relative %: 64 %
Platelet Count: 130 10*3/uL — ABNORMAL LOW (ref 150–400)
RBC: 3.78 MIL/uL — ABNORMAL LOW (ref 4.22–5.81)
RDW: 12.3 % (ref 11.5–15.5)
WBC Count: 4.5 10*3/uL (ref 4.0–10.5)
nRBC: 0 % (ref 0.0–0.2)

## 2023-05-04 NOTE — Progress Notes (Signed)
[facility]  Telephone:(336) [phone] Fax:(336) [phone]     ID: Joe [name] DOB: Jan 13, 1929  Joe#: [id number]  EXB#:[id number]  Patient Care Team: Georgianne Fick, MD as PCP - General (Internal Medicine) Duke Salvia, MD as PCP - Electrophysiology (Cardiology) Long-Stokes, Idamae Schuller, MD  CHIEF COMPLAINT: multiple myeloma  CURRENT TREATMENT: denosumab Xgeva every 12 weeks; lenalidomide 10 mg 21/7  INTERVAL HISTORY:  Patient is here for follow-up.  He is feeling better today. He complains of some back pain, mild. He has however remained active in the garden.  No other bone pains or B symptoms. No change in breathing or bowel habits or urinary habits.   Rest of the pertinent 10 point ROS reviewed and negative.  Lab Results  Component Value Date   KAPLAMBRATIO 2.28 (H) 03/08/2023   KAPLAMBRATIO 2.23 (H) 12/28/2022   KAPLAMBRATIO 2.19 (H) 09/17/2022   KAPLAMBRATIO 2.25 (H) 07/28/2022   KAPLAMBRATIO 2.40 (H) 06/25/2022    REVIEW OF SYSTEMS:  Rest of the pertinent 10 point ROS reviewed and negative   COVID 19 VACCINATION STATUS: received Pfizer x2 with booster October 2021.  He received evusheld on 03/20/2021 and is due a second dose   HISTORY OF CURRENT ILLNESS:  From the original intake note:  Joe [name] is an 89 y/o [place] man formerly followed by my partner Dr Arbutus Ped for M-GUS. We have an M-Spike of 0.87 documented 07/12/2012. [name] kappa/lambda light chains and total IgG were follower yearly until 01/2016, when they were 2.36 and 1517 respectively (unremarkable). He was lost to follow-up after that point.  [02/29/2020] the patient presented to Urgent Care with poorly controlled pain. This was felt to be mussculoskeletal and he was started on a medrol dose-pak and robaxin. However his pain worsened and he presented to the ED at [facility] where vitals and exam were unremarkable but labs showed a creatinine of 1.84  (baseline 1.0), calcium 12.7 with albumin 2.8 and total protein 10.4 (globulin fraction 7.8).  CT of the abdomen obtained for evaluation of his abdominal and back pain showed no hydronephrosis but multiple lytic lesons.   The patient's subsequent history is as detailed below.  PAST MEDICAL HISTORY: Past Medical History:  Diagnosis Date   Enlarged prostate    Hypertension    Melanoma (HCC) 2021   Multiple myeloma (HCC)     PAST SURGICAL HISTORY: Past Surgical History:  Procedure Laterality Date   EYE SURGERY     PACEMAKER IMPLANT N/A 11/13/2022   Procedure: PACEMAKER IMPLANT;  Surgeon: Duke Salvia, MD;  Location: [facility] INVASIVE CV LAB;  Service: Cardiovascular;  Laterality: N/A;   PROSTATE SURGERY      FAMILY HISTORY Family History  Problem Relation Age of Onset   Hypertension Mother     SOCIAL HISTORY:  Retired, used to work for the [employer]. Lives with wife [place]. Has 4 children, 2 in [place], one in [place], one in [place]; 5 grandchildren and 5 great-grandchildren. Attends a [employer].    ADVANCED DIRECTIVES: at the initial visit the patient confirmed his wife is his HCPOA (despite her memory issues).   HEALTH MAINTENANCE: Social History   Tobacco Use   Smoking status: Never   Smokeless tobacco: Never  Vaping Use   Vaping Use: Never used  Substance Use Topics   Alcohol use: Never   Drug use: Never     Colonoscopy: 6-8 years ago     No Known Allergies  Current Outpatient Medications  Medication Sig Dispense Refill   acyclovir (ZOVIRAX) 400 MG tablet Take 1 tablet (400 mg total) by mouth 2 (two) times daily. 180 tablet 3   allopurinol (ZYLOPRIM) 100 MG tablet Take 100 mg by mouth daily.     amLODipine (NORVASC) 5 MG tablet Take 5 mg by mouth daily.     aspirin EC 81 MG tablet Take 81 mg by mouth daily. Swallow whole.      calcium carbonate (TUMS - DOSED IN MG ELEMENTAL CALCIUM) 500 MG chewable tablet Chew 3 tablets by mouth  daily.     doxazosin (CARDURA) 4 MG tablet Take 4 mg by mouth at bedtime.     famotidine (PEPCID) 20 MG tablet Take 1 tablet by mouth once daily 90 tablet 0   finasteride (PROSCAR) 5 MG tablet Take 1 tablet (5 mg total) by mouth daily. 30 tablet 3   fosinopril (MONOPRIL) 40 MG tablet Take 40 mg by mouth daily.     gabapentin (NEURONTIN) 100 MG capsule TAKE 1 CAPSULE BY MOUTH THREE TIMES DAILY 90 capsule 0   indapamide (LOZOL) 2.5 MG tablet Take 2.5 mg by mouth daily.      lenalidomide (REVLIMID) 10 MG capsule Take 1 tablet daily for 21 days then 7 days off every 28 days 21 capsule 0   Magnesium Oxide 400 MG CAPS Take 400 mg by mouth twice a day for 2 weeks then reduce back to 400 mg daily 180 capsule 1   ondansetron (ZOFRAN) 8 MG tablet TAKE 1 TABLET BY MOUTH THREE TIMES DAILY AS NEEDED FOR NAUSEA OR VOMITING (Patient taking differently: Take 8 mg by mouth every 8 (eight) hours as needed for nausea or vomiting.) 20 tablet 0   potassium chloride (KLOR-CON) 10 MEQ tablet Take 2 tablets by mouth once daily 180 tablet 0   pravastatin (PRAVACHOL) 40 MG tablet Take 40 mg by mouth daily.     prednisoLONE acetate (PRED FORTE) 1 % ophthalmic suspension Place 1 drop into the left eye 2 (two) times daily.     tamsulosin (FLOMAX) 0.4 MG CAPS capsule TAKE 1 CAPSULE BY MOUTH ONCE DAILY AFTER SUPPER 90 capsule 0   No current facility-administered medications for this visit.    OBJECTIVE: African-American man in no acute distress Vitals:   05/04/23 1250  BP: (!) 131/57  Pulse: 67  Resp: 18  Temp: (!) 97.5 F (36.4 C)  SpO2: 100%         Body mass index is 24.91 kg/m.   Wt Readings from Last 3 Encounters:  05/04/23 178 lb 9.6 oz (81 kg)  03/08/23 177 lb 1.6 oz (80.3 kg)  02/17/23 174 lb 9.6 oz (79.2 kg)  ECOG FS:1 - Symptomatic but completely ambulatory  Physical Exam Constitutional:      Appearance: Normal appearance.  HENT:     Head: Normocephalic and atraumatic.  Cardiovascular:      Rate and Rhythm: Normal rate and regular rhythm.  Abdominal:     General: Abdomen is flat.     Palpations: Abdomen is soft.  Musculoskeletal:        General: No swelling or tenderness.     Cervical back: Normal range of motion and neck supple.  Neurological:     General: No focal deficit present.     Mental Status: He is alert.  Psychiatric:        Mood and Affect: Mood normal.     LAB RESULTS:  CMP     Component Value Date/Time  NA 138 03/08/2023 1252   NA 137 12/01/2022 1337   NA 140 01/09/2016 1045   K 4.0 03/08/2023 1252   K 3.8 01/09/2016 1045   CL 106 03/08/2023 1252   CO2 27 03/08/2023 1252   CO2 27 01/09/2016 1045   GLUCOSE 107 (H) 03/08/2023 1252   GLUCOSE 89 01/09/2016 1045   BUN 26 (H) 03/08/2023 1252   BUN 28 12/01/2022 1337   BUN 14.7 01/09/2016 1045   CREATININE 1.24 03/08/2023 1252   CREATININE 1.0 01/09/2016 1045   CALCIUM 9.3 03/08/2023 1252   CALCIUM 9.2 01/09/2016 1045   PROT 6.8 03/08/2023 1252   PROT 7.3 01/09/2016 1045   ALBUMIN 3.8 03/08/2023 1252   ALBUMIN 3.7 01/09/2016 1045   AST 15 03/08/2023 1252   AST 18 01/09/2016 1045   ALT 8 03/08/2023 1252   ALT 15 01/09/2016 1045   ALKPHOS 44 03/08/2023 1252   ALKPHOS 60 01/09/2016 1045   BILITOT 0.5 03/08/2023 1252   BILITOT 0.51 01/09/2016 1045   GFRNONAA 55 (L) 03/08/2023 1252   GFRAA >60 08/30/2020 1402    Lab Results  Component Value Date   TOTALPROTELP 6.2 12/28/2022   ALBUMINELP 3.4 12/28/2022   A1GS 0.2 12/28/2022   A2GS 0.6 12/28/2022   BETS 0.7 12/28/2022   BETA2SER 3.6 07/12/2012   GAMS 1.3 12/28/2022   MSPIKE Not Observed 12/28/2022   SPEI * 07/12/2012     Lab Results  Component Value Date   KPAFRELGTCHN 32.6 (H) 03/08/2023   LAMBDASER 14.3 03/08/2023   KAPLAMBRATIO 2.28 (H) 03/08/2023    Lab Results  Component Value Date   WBC 4.5 05/04/2023   NEUTROABS 2.8 05/04/2023   HGB 10.2 (L) 05/04/2023   HCT 32.1 (L) 05/04/2023   MCV 84.9 05/04/2023   PLT 130 (L)  05/04/2023      Chemistry      Component Value Date/Time   NA 138 03/08/2023 1252   NA 137 12/01/2022 1337   NA 140 01/09/2016 1045   K 4.0 03/08/2023 1252   K 3.8 01/09/2016 1045   CL 106 03/08/2023 1252   CO2 27 03/08/2023 1252   CO2 27 01/09/2016 1045   BUN 26 (H) 03/08/2023 1252   BUN 28 12/01/2022 1337   BUN 14.7 01/09/2016 1045   CREATININE 1.24 03/08/2023 1252   CREATININE 1.0 01/09/2016 1045      Component Value Date/Time   CALCIUM 9.3 03/08/2023 1252   CALCIUM 9.2 01/09/2016 1045   ALKPHOS 44 03/08/2023 1252   ALKPHOS 60 01/09/2016 1045   AST 15 03/08/2023 1252   AST 18 01/09/2016 1045   ALT 8 03/08/2023 1252   ALT 15 01/09/2016 1045   BILITOT 0.5 03/08/2023 1252   BILITOT 0.51 01/09/2016 1045       No results found for: "LABCA2"  No components found for: "ZOXWRU045"  No results for input(s): "INR" in the last 168 hours.  No results found for: "LABCA2"  No results found for: "WUJ811"  No results found for: "CAN125"  No results found for: "CAN153"  No results found for: "CA2729"  No components found for: "HGQUANT"  No results found for: "CEA1", "CEA" / No results found for: "CEA1", "CEA"   No results found for: "AFPTUMOR"  No results found for: "CHROMOGRNA"  No results found for: "HGBA", "HGBA2QUANT", "HGBFQUANT", "HGBSQUAN" (Hemoglobinopathy evaluation)   Lab Results  Component Value Date   LDH 136 01/09/2016    Lab Results  Component Value Date   IRON  28 (L) 03/26/2022   TIBC 304 03/26/2022   IRONPCTSAT 9 (L) 03/26/2022   (Iron and TIBC)  Lab Results  Component Value Date   FERRITIN 142 05/25/2022    Urinalysis    Component Value Date/Time   COLORURINE AMBER (A) 03/01/2020 1052   APPEARANCEUR CLOUDY (A) 03/01/2020 1052   LABSPEC 1.021 03/01/2020 1052   PHURINE 5.0 03/01/2020 1052   GLUCOSEU NEGATIVE 03/01/2020 1052   HGBUR NEGATIVE 03/01/2020 1052   BILIRUBINUR NEGATIVE 03/01/2020 1052   KETONESUR NEGATIVE 03/01/2020  1052   PROTEINUR 100 (A) 03/01/2020 1052   UROBILINOGEN 0.2 02/29/2020 1537   NITRITE NEGATIVE 03/01/2020 1052   LEUKOCYTESUR NEGATIVE 03/01/2020 1052    STUDIES: No results found.   ELIGIBLE FOR AVAILABLE RESEARCH PROTOCOL:   ASSESSMENT: 89 y.o.  32 [place] man with a history of M-GUS, presenting 02/29/2020 with a high globulin fraction, worsening renal function, hypercalcemia, anemia and multiple lytic bone lesions.    (1) IgG Kappa Multiple Myeloma: labs diagnostic on 03/01/2020 with Mspike of 3.8, Kappa free light chain 690.6, and ratio 117.05.  (a) CT A/P on 03/01/2020 shows "innumerable" lytic lesions involving all visualized bones  (b) bone marrow biopsy 03/11/2020 shows 20% plasmacytosis by CD138, 12% by manual differential; molecular studies not requested  (c) Bortezomib and Dexamethasone given weekly 3 weeks on and 1 week off beginning 03/11/2020, with good response, last dose 07/24/2020  (d) transitioned to lenalidomide 10 mg daily 21/7 starting 07/31/2020    (2) lytic bone lesions/ hypercalcemia:  (a) Pamidronate administered on 03/01/2020  (b) denosumab/Xgeva started 04/17/2020-- repeat every 12 weeks   (a) to take TUMS 3 tabs on day of Xgeva dose  PLAN:   Joe [name] is here for follow up. He denies any new complaints today No concerns on exam Labs from today show no concern for myeloma relapse, however myeloma labs are pending We plan to continue holding revelimid at this time. He has been getting xgeva every 12 weeks since 2021. No new dental concerns reported. RTC in 8 weeks for FU and labs.  Total time spent: 20 min  *Total Encounter Time as defined by the Centers for Medicare and Medicaid Services includes, in addition to the face-to-face time of a patient visit (documented in the note above) non-face-to-face time: obtaining and reviewing outside history, ordering and reviewing medications, tests or procedures, care coordination (communications with other health care  professionals or caregivers) and documentation in the medical record.

## 2023-05-05 LAB — KAPPA/LAMBDA LIGHT CHAINS
Kappa free light chain: 29.1 mg/L — ABNORMAL HIGH (ref 3.3–19.4)
Kappa, lambda light chain ratio: 1.98 — ABNORMAL HIGH (ref 0.26–1.65)
Lambda free light chains: 14.7 mg/L (ref 5.7–26.3)

## 2023-05-05 LAB — IGG, IGA, IGM
IgA: 211 mg/dL (ref 61–437)
IgG (Immunoglobin G), Serum: 1391 mg/dL (ref 603–1613)
IgM (Immunoglobulin M), Srm: 43 mg/dL (ref 15–143)

## 2023-05-10 LAB — MULTIPLE MYELOMA PANEL, SERUM
Albumin SerPl Elph-Mcnc: 3.3 g/dL (ref 2.9–4.4)
Albumin/Glob SerPl: 1.1 (ref 0.7–1.7)
Alpha 1: 0.2 g/dL (ref 0.0–0.4)
Alpha2 Glob SerPl Elph-Mcnc: 0.6 g/dL (ref 0.4–1.0)
B-Globulin SerPl Elph-Mcnc: 0.8 g/dL (ref 0.7–1.3)
Gamma Glob SerPl Elph-Mcnc: 1.4 g/dL (ref 0.4–1.8)
Globulin, Total: 3.1 g/dL (ref 2.2–3.9)
IgA: 212 mg/dL (ref 61–437)
IgG (Immunoglobin G), Serum: 1394 mg/dL (ref 603–1613)
IgM (Immunoglobulin M), Srm: 46 mg/dL (ref 15–143)
Total Protein ELP: 6.4 g/dL (ref 6.0–8.5)

## 2023-05-17 ENCOUNTER — Ambulatory Visit (INDEPENDENT_AMBULATORY_CARE_PROVIDER_SITE_OTHER): Payer: Medicare Other

## 2023-05-17 DIAGNOSIS — I442 Atrioventricular block, complete: Secondary | ICD-10-CM

## 2023-05-17 LAB — CUP PACEART REMOTE DEVICE CHECK
Battery Remaining Longevity: 96 mo
Battery Remaining Percentage: 95.5 %
Battery Voltage: 3.01 V
Brady Statistic AP VP Percent: 73 %
Brady Statistic AP VS Percent: 1 %
Brady Statistic AS VP Percent: 27 %
Brady Statistic AS VS Percent: 1 %
Brady Statistic RA Percent Paced: 73 %
Brady Statistic RV Percent Paced: 99 %
Date Time Interrogation Session: 20240617020014
Implantable Lead Connection Status: 753985
Implantable Lead Connection Status: 753985
Implantable Lead Implant Date: 20231215
Implantable Lead Implant Date: 20231215
Implantable Lead Location: 753859
Implantable Lead Location: 753860
Implantable Lead Model: 1944
Implantable Lead Model: 1948
Implantable Pulse Generator Implant Date: 20231215
Lead Channel Impedance Value: 530 Ohm
Lead Channel Impedance Value: 550 Ohm
Lead Channel Pacing Threshold Amplitude: 0.5 V
Lead Channel Pacing Threshold Amplitude: 0.5 V
Lead Channel Pacing Threshold Pulse Width: 0.5 ms
Lead Channel Pacing Threshold Pulse Width: 0.5 ms
Lead Channel Sensing Intrinsic Amplitude: 2.3 mV
Lead Channel Sensing Intrinsic Amplitude: 8.6 mV
Lead Channel Setting Pacing Amplitude: 2.5 V
Lead Channel Setting Pacing Amplitude: 2.5 V
Lead Channel Setting Pacing Pulse Width: 0.5 ms
Lead Channel Setting Sensing Sensitivity: 4 mV
Pulse Gen Model: 2272
Pulse Gen Serial Number: 8129463

## 2023-05-21 ENCOUNTER — Other Ambulatory Visit: Payer: Self-pay | Admitting: Hematology and Oncology

## 2023-05-21 DIAGNOSIS — D472 Monoclonal gammopathy: Secondary | ICD-10-CM

## 2023-06-04 NOTE — Progress Notes (Signed)
Remote pacemaker transmission.   

## 2023-06-21 ENCOUNTER — Other Ambulatory Visit: Payer: Self-pay | Admitting: Hematology and Oncology

## 2023-06-21 DIAGNOSIS — D472 Monoclonal gammopathy: Secondary | ICD-10-CM

## 2023-07-08 ENCOUNTER — Other Ambulatory Visit: Payer: Self-pay | Admitting: Hematology and Oncology

## 2023-07-08 DIAGNOSIS — C9000 Multiple myeloma not having achieved remission: Secondary | ICD-10-CM

## 2023-07-08 DIAGNOSIS — D472 Monoclonal gammopathy: Secondary | ICD-10-CM

## 2023-07-26 ENCOUNTER — Other Ambulatory Visit: Payer: Self-pay | Admitting: Hematology and Oncology

## 2023-07-29 ENCOUNTER — Other Ambulatory Visit: Payer: Self-pay | Admitting: Adult Health

## 2023-07-29 ENCOUNTER — Other Ambulatory Visit: Payer: Self-pay | Admitting: Hematology and Oncology

## 2023-07-29 DIAGNOSIS — D472 Monoclonal gammopathy: Secondary | ICD-10-CM

## 2023-07-29 DIAGNOSIS — C9000 Multiple myeloma not having achieved remission: Secondary | ICD-10-CM

## 2023-07-30 ENCOUNTER — Inpatient Hospital Stay: Payer: Medicare Other | Attending: Hematology and Oncology

## 2023-07-30 ENCOUNTER — Encounter: Payer: Self-pay | Admitting: Oncology

## 2023-07-30 ENCOUNTER — Inpatient Hospital Stay: Payer: Medicare Other | Admitting: Hematology and Oncology

## 2023-07-30 VITALS — BP 110/51 | HR 62 | Temp 97.3°F | Resp 18 | Ht 71.0 in | Wt 183.1 lb

## 2023-07-30 DIAGNOSIS — C9000 Multiple myeloma not having achieved remission: Secondary | ICD-10-CM | POA: Insufficient documentation

## 2023-07-30 DIAGNOSIS — Z79624 Long term (current) use of inhibitors of nucleotide synthesis: Secondary | ICD-10-CM | POA: Diagnosis not present

## 2023-07-30 DIAGNOSIS — Z79899 Other long term (current) drug therapy: Secondary | ICD-10-CM | POA: Diagnosis not present

## 2023-07-30 DIAGNOSIS — C9001 Multiple myeloma in remission: Secondary | ICD-10-CM

## 2023-07-30 LAB — CBC WITH DIFFERENTIAL (CANCER CENTER ONLY)
Abs Immature Granulocytes: 0 10*3/uL (ref 0.00–0.07)
Basophils Absolute: 0 10*3/uL (ref 0.0–0.1)
Basophils Relative: 1 %
Eosinophils Absolute: 0.1 10*3/uL (ref 0.0–0.5)
Eosinophils Relative: 3 %
HCT: 33.4 % — ABNORMAL LOW (ref 39.0–52.0)
Hemoglobin: 10.3 g/dL — ABNORMAL LOW (ref 13.0–17.0)
Immature Granulocytes: 0 %
Lymphocytes Relative: 25 %
Lymphs Abs: 1.1 10*3/uL (ref 0.7–4.0)
MCH: 26.2 pg (ref 26.0–34.0)
MCHC: 30.8 g/dL (ref 30.0–36.0)
MCV: 85 fL (ref 80.0–100.0)
Monocytes Absolute: 0.4 10*3/uL (ref 0.1–1.0)
Monocytes Relative: 9 %
Neutro Abs: 2.8 10*3/uL (ref 1.7–7.7)
Neutrophils Relative %: 62 %
Platelet Count: 132 10*3/uL — ABNORMAL LOW (ref 150–400)
RBC: 3.93 MIL/uL — ABNORMAL LOW (ref 4.22–5.81)
RDW: 13 % (ref 11.5–15.5)
WBC Count: 4.4 10*3/uL (ref 4.0–10.5)
nRBC: 0 % (ref 0.0–0.2)

## 2023-07-30 LAB — CMP (CANCER CENTER ONLY)
ALT: 12 U/L (ref 0–44)
AST: 19 U/L (ref 15–41)
Albumin: 3.7 g/dL (ref 3.5–5.0)
Alkaline Phosphatase: 41 U/L (ref 38–126)
Anion gap: 6 (ref 5–15)
BUN: 25 mg/dL — ABNORMAL HIGH (ref 8–23)
CO2: 28 mmol/L (ref 22–32)
Calcium: 9 mg/dL (ref 8.9–10.3)
Chloride: 105 mmol/L (ref 98–111)
Creatinine: 1.1 mg/dL (ref 0.61–1.24)
GFR, Estimated: >60 mL/min (ref >60)
Glucose, Bld: 100 mg/dL — ABNORMAL HIGH (ref 70–99)
Potassium: 3.8 mmol/L (ref 3.5–5.1)
Sodium: 139 mmol/L (ref 135–145)
Total Bilirubin: 0.5 mg/dL (ref 0.3–1.2)
Total Protein: 7 g/dL (ref 6.5–8.1)

## 2023-07-30 NOTE — Progress Notes (Signed)
[facility]  Telephone:(336) [phone] Fax:(336) [phone]     ID: Joe [name] DOB: [birth year]  MR#: [id number]  YQM#:[id number]  Patient Care Team: Georgianne Fick, MD as PCP - General (Internal Medicine) Duke Salvia, MD as PCP - Electrophysiology (Cardiology) Long-Stokes, Idamae Schuller, MD  CHIEF COMPLAINT: multiple myeloma  CURRENT TREATMENT: denosumab Xgeva every 12 weeks; lenalidomide 10 mg 21/7  INTERVAL HISTORY:  Patient is here for follow-up.  He is feeling much better today, He is staying active, doing all the chores. No other bone pains or B symptoms. No change in breathing or bowel habits or urinary habits.   Rest of the pertinent 10 point ROS reviewed and negative.  Lab Results  Component Value Date   KAPLAMBRATIO 1.98 (H) 05/04/2023   KAPLAMBRATIO 2.28 (H) 03/08/2023   KAPLAMBRATIO 2.23 (H) 12/28/2022   KAPLAMBRATIO 2.19 (H) 09/17/2022   KAPLAMBRATIO 2.25 (H) 07/28/2022    REVIEW OF SYSTEMS:  Rest of the pertinent 10 point ROS reviewed and negative   COVID 19 VACCINATION STATUS: received Pfizer x2 with booster October 2021.  He received evusheld on 03/20/2021 and is due a second dose   HISTORY OF CURRENT ILLNESS:  From the original intake note:  [name] is an 89 y/o [place] man formerly followed by my partner Dr Arbutus Ped for M-GUS. We have an M-Spike of 0.87 documented 07/12/2012. [name] kappa/lambda light chains and total IgG were follower yearly until 01/2016, when they were 2.36 and 1517 respectively (unremarkable). He was lost to follow-up after that point.  [02/29/2020] the patient presented to Urgent Care with poorly controlled pain. This was felt to be mussculoskeletal and he was started on a medrol dose-pak and robaxin. However his pain worsened and he presented to the ED at [facility] where vitals and exam were unremarkable but labs showed a creatinine of 1.84 (baseline 1.0), calcium 12.7 with albumin  2.8 and total protein 10.4 (globulin fraction 7.8).  CT of the abdomen obtained for evaluation of his abdominal and back pain showed no hydronephrosis but multiple lytic lesons.   The patient's subsequent history is as detailed below.  PAST MEDICAL HISTORY: Past Medical History:  Diagnosis Date   Enlarged prostate    Hypertension    Melanoma (HCC) 2021   Multiple myeloma (HCC)     PAST SURGICAL HISTORY: Past Surgical History:  Procedure Laterality Date   EYE SURGERY     PACEMAKER IMPLANT N/A 11/13/2022   Procedure: PACEMAKER IMPLANT;  Surgeon: Duke Salvia, MD;  Location: [facility] INVASIVE CV LAB;  Service: Cardiovascular;  Laterality: N/A;   PROSTATE SURGERY      FAMILY HISTORY Family History  Problem Relation Age of Onset   Hypertension Mother     SOCIAL HISTORY:  Retired, used to work for the [employer]. Lives with wife [place]. Has 4 children, 2 in [place], one in [place], one in [place]; 5 grandchildren and 5 great-grandchildren. Attends a [employer].    ADVANCED DIRECTIVES: at the initial visit the patient confirmed his wife is his HCPOA (despite her memory issues).   HEALTH MAINTENANCE: Social History   Tobacco Use   Smoking status: Never   Smokeless tobacco: Never  Vaping Use   Vaping status: Never Used  Substance Use Topics   Alcohol use: Never   Drug use: Never     Colonoscopy: 6-8 years ago     No Known Allergies  Current Outpatient Medications  Medication Sig Dispense Refill  acyclovir (ZOVIRAX) 400 MG tablet Take 1 tablet (400 mg total) by mouth 2 (two) times daily. 180 tablet 3   allopurinol (ZYLOPRIM) 100 MG tablet Take 100 mg by mouth daily.     amLODipine (NORVASC) 5 MG tablet Take 5 mg by mouth daily.     calcium carbonate (TUMS - DOSED IN MG ELEMENTAL CALCIUM) 500 MG chewable tablet Chew 3 tablets by mouth daily.     doxazosin (CARDURA) 4 MG tablet Take 4 mg by mouth at bedtime.     famotidine (PEPCID) 20 MG tablet  Take 1 tablet by mouth once daily 90 tablet 0   finasteride (PROSCAR) 5 MG tablet Take 1 tablet by mouth once daily 30 tablet 0   fosinopril (MONOPRIL) 40 MG tablet Take 40 mg by mouth daily.     gabapentin (NEURONTIN) 100 MG capsule TAKE 1 CAPSULE BY MOUTH THREE TIMES DAILY 90 capsule 0   indapamide (LOZOL) 2.5 MG tablet Take 2.5 mg by mouth daily.      Magnesium Oxide 400 MG CAPS Take 400 mg by mouth twice a day for 2 weeks then reduce back to 400 mg daily 180 capsule 1   potassium chloride (KLOR-CON) 10 MEQ tablet Take 2 tablets by mouth once daily 180 tablet 0   pravastatin (PRAVACHOL) 40 MG tablet Take 40 mg by mouth daily.     prednisoLONE acetate (PRED FORTE) 1 % ophthalmic suspension Place 1 drop into the left eye 2 (two) times daily.     tamsulosin (FLOMAX) 0.4 MG CAPS capsule TAKE 1 CAPSULE BY MOUTH ONCE DAILY AFTER SUPPER 90 capsule 0   No current facility-administered medications for this visit.    OBJECTIVE: African-American man in no acute distress Vitals:   07/30/23 1324  BP: (!) 110/51  Pulse: 62  Resp: 18  Temp: (!) 97.3 F (36.3 C)  SpO2: 100%         Body mass index is 25.54 kg/m.   Wt Readings from Last 3 Encounters:  07/30/23 183 lb 1.6 oz (83.1 kg)  05/04/23 178 lb 9.6 oz (81 kg)  03/08/23 177 lb 1.6 oz (80.3 kg)  ECOG FS:1 - Symptomatic but completely ambulatory  Physical Exam Constitutional:      Appearance: Normal appearance.  HENT:     Head: Normocephalic and atraumatic.  Cardiovascular:     Rate and Rhythm: Normal rate and regular rhythm.  Abdominal:     General: Abdomen is flat.     Palpations: Abdomen is soft.  Musculoskeletal:        General: No swelling or tenderness.     Cervical back: Normal range of motion and neck supple.  Neurological:     General: No focal deficit present.     Mental Status: He is alert.  Psychiatric:        Mood and Affect: Mood normal.      LAB RESULTS:  CMP     Component Value Date/Time   NA 139  07/30/2023 1235   NA 137 12/01/2022 1337   NA 140 01/09/2016 1045   K 3.8 07/30/2023 1235   K 3.8 01/09/2016 1045   CL 105 07/30/2023 1235   CO2 28 07/30/2023 1235   CO2 27 01/09/2016 1045   GLUCOSE 100 (H) 07/30/2023 1235   GLUCOSE 89 01/09/2016 1045   BUN 25 (H) 07/30/2023 1235   BUN 28 12/01/2022 1337   BUN 14.7 01/09/2016 1045   CREATININE 1.10 07/30/2023 1235   CREATININE 1.0 01/09/2016 1045  CALCIUM 9.0 07/30/2023 1235   CALCIUM 9.2 01/09/2016 1045   PROT 7.0 07/30/2023 1235   PROT 7.3 01/09/2016 1045   ALBUMIN 3.7 07/30/2023 1235   ALBUMIN 3.7 01/09/2016 1045   AST 19 07/30/2023 1235   AST 18 01/09/2016 1045   ALT 12 07/30/2023 1235   ALT 15 01/09/2016 1045   ALKPHOS 41 07/30/2023 1235   ALKPHOS 60 01/09/2016 1045   BILITOT 0.5 07/30/2023 1235   BILITOT 0.51 01/09/2016 1045   GFRNONAA >60 07/30/2023 1235   GFRAA >60 08/30/2020 1402    Lab Results  Component Value Date   TOTALPROTELP 6.4 05/04/2023   ALBUMINELP 3.4 12/28/2022   A1GS 0.2 12/28/2022   A2GS 0.6 12/28/2022   BETS 0.7 12/28/2022   BETA2SER 3.6 07/12/2012   GAMS 1.3 12/28/2022   MSPIKE Not Observed 12/28/2022   SPEI * 07/12/2012     Lab Results  Component Value Date   KPAFRELGTCHN 29.1 (H) 05/04/2023   LAMBDASER 14.7 05/04/2023   KAPLAMBRATIO 1.98 (H) 05/04/2023    Lab Results  Component Value Date   WBC 4.4 07/30/2023   NEUTROABS 2.8 07/30/2023   HGB 10.3 (L) 07/30/2023   HCT 33.4 (L) 07/30/2023   MCV 85.0 07/30/2023   PLT 132 (L) 07/30/2023      Chemistry      Component Value Date/Time   NA 139 07/30/2023 1235   NA 137 12/01/2022 1337   NA 140 01/09/2016 1045   K 3.8 07/30/2023 1235   K 3.8 01/09/2016 1045   CL 105 07/30/2023 1235   CO2 28 07/30/2023 1235   CO2 27 01/09/2016 1045   BUN 25 (H) 07/30/2023 1235   BUN 28 12/01/2022 1337   BUN 14.7 01/09/2016 1045   CREATININE 1.10 07/30/2023 1235   CREATININE 1.0 01/09/2016 1045      Component Value Date/Time   CALCIUM  9.0 07/30/2023 1235   CALCIUM 9.2 01/09/2016 1045   ALKPHOS 41 07/30/2023 1235   ALKPHOS 60 01/09/2016 1045   AST 19 07/30/2023 1235   AST 18 01/09/2016 1045   ALT 12 07/30/2023 1235   ALT 15 01/09/2016 1045   BILITOT 0.5 07/30/2023 1235   BILITOT 0.51 01/09/2016 1045       No results found for: "LABCA2"  No components found for: "ZOXWRU045"  No results for input(s): "INR" in the last 168 hours.  No results found for: "LABCA2"  No results found for: "WUJ811"  No results found for: "CAN125"  No results found for: "CAN153"  No results found for: "CA2729"  No components found for: "HGQUANT"  No results found for: "CEA1", "CEA" / No results found for: "CEA1", "CEA"   No results found for: "AFPTUMOR"  No results found for: "CHROMOGRNA"  No results found for: "HGBA", "HGBA2QUANT", "HGBFQUANT", "HGBSQUAN" (Hemoglobinopathy evaluation)   Lab Results  Component Value Date   LDH 136 01/09/2016    Lab Results  Component Value Date   IRON 28 (L) 03/26/2022   TIBC 304 03/26/2022   IRONPCTSAT 9 (L) 03/26/2022   (Iron and TIBC)  Lab Results  Component Value Date   FERRITIN 142 05/25/2022    Urinalysis    Component Value Date/Time   COLORURINE AMBER (A) 03/01/2020 1052   APPEARANCEUR CLOUDY (A) 03/01/2020 1052   LABSPEC 1.021 03/01/2020 1052   PHURINE 5.0 03/01/2020 1052   GLUCOSEU NEGATIVE 03/01/2020 1052   HGBUR NEGATIVE 03/01/2020 1052   BILIRUBINUR NEGATIVE 03/01/2020 1052   KETONESUR NEGATIVE 03/01/2020 1052  PROTEINUR 100 (A) 03/01/2020 1052   UROBILINOGEN 0.2 02/29/2020 1537   NITRITE NEGATIVE 03/01/2020 1052   LEUKOCYTESUR NEGATIVE 03/01/2020 1052    STUDIES: No results found.   ELIGIBLE FOR AVAILABLE RESEARCH PROTOCOL:   ASSESSMENT: 89 y.o.  50 [place] man with a history of M-GUS, presenting 02/29/2020 with a high globulin fraction, worsening renal function, hypercalcemia, anemia and multiple lytic bone lesions.    (1) IgG Kappa  Multiple Myeloma: labs diagnostic on 03/01/2020 with Mspike of 3.8, Kappa free light chain 690.6, and ratio 117.05.  (a) CT A/P on 03/01/2020 shows "innumerable" lytic lesions involving all visualized bones  (b) bone marrow biopsy 03/11/2020 shows 20% plasmacytosis by CD138, 12% by manual differential; molecular studies not requested  (c) Bortezomib and Dexamethasone given weekly 3 weeks on and 1 week off beginning 03/11/2020, with good response, last dose 07/24/2020  (d) transitioned to lenalidomide 10 mg daily 21/7 starting 07/31/2020    (2) lytic bone lesions/ hypercalcemia:  (a) Pamidronate administered on 03/01/2020  (b) denosumab/Xgeva started 04/17/2020-- repeat every 12 weeks   (a) to take TUMS 3 tabs on day of Xgeva dose  PLAN:   Mr Rambo is here for follow up.  He denies any new complaints today No concerns on exam today Labs from today show no concern for myeloma relapse,  He has been getting xgeva every 12 weeks since 2021. No new dental concerns reported. We will get him started on it again. RTC in 8 weeks for FU and labs.  Total time spent: 20 min  *Total Encounter Time as defined by the Centers for Medicare and Medicaid Services includes, in addition to the face-to-face time of a patient visit (documented in the note above) non-face-to-face time: obtaining and reviewing outside history, ordering and reviewing medications, tests or procedures, care coordination (communications with other health care professionals or caregivers) and documentation in the medical record.

## 2023-08-03 LAB — KAPPA/LAMBDA LIGHT CHAINS
Kappa free light chain: 24.8 mg/L — ABNORMAL HIGH (ref 3.3–19.4)
Kappa, lambda light chain ratio: 1.95 — ABNORMAL HIGH (ref 0.26–1.65)
Lambda free light chains: 12.7 mg/L (ref 5.7–26.3)

## 2023-08-04 ENCOUNTER — Telehealth: Payer: Self-pay

## 2023-08-04 ENCOUNTER — Encounter: Payer: Self-pay | Admitting: Oncology

## 2023-08-04 MED ORDER — FINASTERIDE 5 MG PO TABS
5.0000 mg | ORAL_TABLET | Freq: Every day | ORAL | 3 refills | Status: DC
Start: 2023-08-04 — End: 2023-12-06

## 2023-08-04 NOTE — Telephone Encounter (Signed)
Pt LVM regarding Finasteride prescription. Pt states that he went to pick it up from the pharmacy and was unable to refill.  Pt's prescription refilled by Milton Ferguson, RN. Pt called back and made aware that we refilled this medication for him. Pt verbalized understanding and had no further questions at this time.

## 2023-08-05 LAB — MULTIPLE MYELOMA PANEL, SERUM
Albumin SerPl Elph-Mcnc: 3.5 g/dL (ref 2.9–4.4)
Albumin/Glob SerPl: 1.3 (ref 0.7–1.7)
Alpha 1: 0.2 g/dL (ref 0.0–0.4)
Alpha2 Glob SerPl Elph-Mcnc: 0.6 g/dL (ref 0.4–1.0)
B-Globulin SerPl Elph-Mcnc: 0.8 g/dL (ref 0.7–1.3)
Gamma Glob SerPl Elph-Mcnc: 1.2 g/dL (ref 0.4–1.8)
Globulin, Total: 2.9 g/dL (ref 2.2–3.9)
IgA: 206 mg/dL (ref 61–437)
IgG (Immunoglobin G), Serum: 1337 mg/dL (ref 603–1613)
IgM (Immunoglobulin M), Srm: 56 mg/dL (ref 15–143)
Total Protein ELP: 6.4 g/dL (ref 6.0–8.5)

## 2023-08-16 ENCOUNTER — Ambulatory Visit (INDEPENDENT_AMBULATORY_CARE_PROVIDER_SITE_OTHER): Payer: Medicare Other

## 2023-08-16 DIAGNOSIS — I442 Atrioventricular block, complete: Secondary | ICD-10-CM | POA: Diagnosis not present

## 2023-08-17 LAB — CUP PACEART REMOTE DEVICE CHECK
Battery Remaining Longevity: 94 mo
Battery Remaining Percentage: 93 %
Battery Voltage: 3.01 V
Brady Statistic AP VP Percent: 71 %
Brady Statistic AP VS Percent: 1 %
Brady Statistic AS VP Percent: 29 %
Brady Statistic AS VS Percent: 1 %
Brady Statistic RA Percent Paced: 71 %
Brady Statistic RV Percent Paced: 99 %
Date Time Interrogation Session: 20240916020724
Implantable Lead Connection Status: 753985
Implantable Lead Connection Status: 753985
Implantable Lead Implant Date: 20231215
Implantable Lead Implant Date: 20231215
Implantable Lead Location: 753859
Implantable Lead Location: 753860
Implantable Lead Model: 1944
Implantable Lead Model: 1948
Implantable Pulse Generator Implant Date: 20231215
Lead Channel Impedance Value: 550 Ohm
Lead Channel Impedance Value: 580 Ohm
Lead Channel Pacing Threshold Amplitude: 0.5 V
Lead Channel Pacing Threshold Amplitude: 0.5 V
Lead Channel Pacing Threshold Pulse Width: 0.5 ms
Lead Channel Pacing Threshold Pulse Width: 0.5 ms
Lead Channel Sensing Intrinsic Amplitude: 4.2 mV
Lead Channel Sensing Intrinsic Amplitude: 8.6 mV
Lead Channel Setting Pacing Amplitude: 2.5 V
Lead Channel Setting Pacing Amplitude: 2.5 V
Lead Channel Setting Pacing Pulse Width: 0.5 ms
Lead Channel Setting Sensing Sensitivity: 4 mV
Pulse Gen Model: 2272
Pulse Gen Serial Number: 8129463

## 2023-08-18 DIAGNOSIS — Z23 Encounter for immunization: Secondary | ICD-10-CM | POA: Diagnosis not present

## 2023-08-21 ENCOUNTER — Other Ambulatory Visit: Payer: Self-pay | Admitting: Hematology and Oncology

## 2023-08-23 ENCOUNTER — Other Ambulatory Visit: Payer: Self-pay | Admitting: Hematology and Oncology

## 2023-08-30 NOTE — Progress Notes (Signed)
Remote pacemaker transmission.   

## 2023-09-19 ENCOUNTER — Other Ambulatory Visit: Payer: Self-pay | Admitting: Hematology and Oncology

## 2023-09-20 ENCOUNTER — Encounter: Payer: Self-pay | Admitting: Oncology

## 2023-09-24 ENCOUNTER — Other Ambulatory Visit: Payer: Self-pay | Admitting: *Deleted

## 2023-09-24 DIAGNOSIS — C9000 Multiple myeloma not having achieved remission: Secondary | ICD-10-CM

## 2023-09-24 MED ORDER — ACYCLOVIR 400 MG PO TABS: 400.0000 mg | ORAL_TABLET | Freq: Two times a day (BID) | ORAL | 3 refills | Status: DC

## 2023-09-27 ENCOUNTER — Telehealth: Payer: Self-pay

## 2023-09-27 NOTE — Telephone Encounter (Signed)
This RN returned call to pt after pt LVM to [facility] 10/25 after hours. This RN called to see which medication the pt needed refilled. Pt stated that the medication has already been refilled and has no other questions or concerns at this time.

## 2023-09-28 ENCOUNTER — Other Ambulatory Visit: Payer: Self-pay

## 2023-09-28 DIAGNOSIS — C9001 Multiple myeloma in remission: Secondary | ICD-10-CM

## 2023-09-28 DIAGNOSIS — C9000 Multiple myeloma not having achieved remission: Secondary | ICD-10-CM

## 2023-09-30 ENCOUNTER — Inpatient Hospital Stay: Payer: Medicare Other

## 2023-09-30 ENCOUNTER — Other Ambulatory Visit: Payer: Self-pay | Admitting: *Deleted

## 2023-09-30 ENCOUNTER — Inpatient Hospital Stay: Payer: Medicare Other | Admitting: Hematology and Oncology

## 2023-09-30 ENCOUNTER — Inpatient Hospital Stay: Payer: Medicare Other | Attending: Hematology and Oncology

## 2023-09-30 VITALS — BP 130/60 | HR 88 | Temp 97.2°F | Resp 18 | Wt 186.3 lb

## 2023-09-30 DIAGNOSIS — C9001 Multiple myeloma in remission: Secondary | ICD-10-CM

## 2023-09-30 DIAGNOSIS — C9000 Multiple myeloma not having achieved remission: Secondary | ICD-10-CM | POA: Diagnosis not present

## 2023-09-30 LAB — CBC WITH DIFFERENTIAL (CANCER CENTER ONLY)
Abs Immature Granulocytes: 0.01 10*3/uL (ref 0.00–0.07)
Basophils Absolute: 0 10*3/uL (ref 0.0–0.1)
Basophils Relative: 0 %
Eosinophils Absolute: 0.1 10*3/uL (ref 0.0–0.5)
Eosinophils Relative: 2 %
HCT: 33.5 % — ABNORMAL LOW (ref 39.0–52.0)
Hemoglobin: 10.4 g/dL — ABNORMAL LOW (ref 13.0–17.0)
Immature Granulocytes: 0 %
Lymphocytes Relative: 24 %
Lymphs Abs: 1.3 10*3/uL (ref 0.7–4.0)
MCH: 26.1 pg (ref 26.0–34.0)
MCHC: 31 g/dL (ref 30.0–36.0)
MCV: 84 fL (ref 80.0–100.0)
Monocytes Absolute: 0.5 10*3/uL (ref 0.1–1.0)
Monocytes Relative: 8 %
Neutro Abs: 3.7 10*3/uL (ref 1.7–7.7)
Neutrophils Relative %: 66 %
Platelet Count: 133 10*3/uL — ABNORMAL LOW (ref 150–400)
RBC: 3.99 MIL/uL — ABNORMAL LOW (ref 4.22–5.81)
RDW: 12.5 % (ref 11.5–15.5)
WBC Count: 5.7 10*3/uL (ref 4.0–10.5)
nRBC: 0 % (ref 0.0–0.2)

## 2023-09-30 LAB — CMP (CANCER CENTER ONLY)
ALT: 9 U/L (ref 0–44)
AST: 15 U/L (ref 15–41)
Albumin: 3.9 g/dL (ref 3.5–5.0)
Alkaline Phosphatase: 55 U/L (ref 38–126)
Anion gap: 5 (ref 5–15)
BUN: 26 mg/dL — ABNORMAL HIGH (ref 8–23)
CO2: 31 mmol/L (ref 22–32)
Calcium: 9.9 mg/dL (ref 8.9–10.3)
Chloride: 103 mmol/L (ref 98–111)
Creatinine: 1.42 mg/dL — ABNORMAL HIGH (ref 0.61–1.24)
GFR, Estimated: 47 mL/min — ABNORMAL LOW (ref >60)
Glucose, Bld: 84 mg/dL (ref 70–99)
Potassium: 4.7 mmol/L (ref 3.5–5.1)
Sodium: 139 mmol/L (ref 135–145)
Total Bilirubin: 0.5 mg/dL (ref 0.3–1.2)
Total Protein: 6.9 g/dL (ref 6.5–8.1)

## 2023-09-30 NOTE — Progress Notes (Signed)
[facility]  Telephone:([phone] Fax:(336) [phone]     ID: Joe [name] DOB: 11-May-1929  Joe#: [id number]  JYN#:[id number]  Patient Care Team: Georgianne Fick, MD as PCP - General (Internal Medicine) Duke Salvia, MD as PCP - Electrophysiology (Cardiology) Long-Stokes, Idamae Schuller, MD  CHIEF COMPLAINT: multiple myeloma  CURRENT TREATMENT: denosumab Xgeva every 12 weeks; lenalidomide 10 mg 21/7  INTERVAL HISTORY:  Patient is here for follow-up.  The patient presents with a week-long history of cold symptoms, including sinus congestion. He has been self-managing with occasional Mucinex, and denies any associated fever, chills, or breathing difficulties. Bowel movements and urination are normal, and appetite is good. No new medications have been started, and no other doctors have been seen since the last visit. The patient is due for a cardiology visit in January. He also reports possible dehydration, drinking only two to three glasses of water a day. Otherwise he is doing well.  Lab Results  Component Value Date   KAPLAMBRATIO 1.95 (H) 07/30/2023   KAPLAMBRATIO 1.98 (H) 05/04/2023   KAPLAMBRATIO 2.28 (H) 03/08/2023   KAPLAMBRATIO 2.23 (H) 12/28/2022   KAPLAMBRATIO 2.19 (H) 09/17/2022    REVIEW OF SYSTEMS:  Rest of the pertinent 10 point ROS reviewed and negative   COVID 19 VACCINATION STATUS: received Pfizer x2 with booster October 2021.  He received evusheld on 03/20/2021 and is due a second dose   HISTORY OF CURRENT ILLNESS:  From the original intake note:  Joe [name] is an 89 y/o [place] man formerly followed by my partner Dr Arbutus Ped for M-GUS. We have an M-Spike of 0.87 documented 07/12/2012. [name] kappa/lambda light chains and total IgG were follower yearly until 01/2016, when they were 2.36 and 1517 respectively (unremarkable). He was lost to follow-up after that point.  [02/29/2020] the patient presented to Urgent Care with poorly  controlled pain. This was felt to be mussculoskeletal and he was started on a medrol dose-pak and robaxin. However his pain worsened and he presented to the ED at [facility] where vitals and exam were unremarkable but labs showed a creatinine of 1.84 (baseline 1.0), calcium 12.7 with albumin 2.8 and total protein 10.4 (globulin fraction 7.8).  CT of the abdomen obtained for evaluation of his abdominal and back pain showed no hydronephrosis but multiple lytic lesons.   The patient's subsequent history is as detailed below.  PAST MEDICAL HISTORY: Past Medical History:  Diagnosis Date   Enlarged prostate    Hypertension    Melanoma (HCC) 2021   Multiple myeloma (HCC)     PAST SURGICAL HISTORY: Past Surgical History:  Procedure Laterality Date   EYE SURGERY     PACEMAKER IMPLANT N/A 11/13/2022   Procedure: PACEMAKER IMPLANT;  Surgeon: Duke Salvia, MD;  Location: [facility] INVASIVE CV LAB;  Service: Cardiovascular;  Laterality: N/A;   PROSTATE SURGERY      FAMILY HISTORY Family History  Problem Relation Age of Onset   Hypertension Mother     SOCIAL HISTORY:  Retired, used to work for the [employer]. Lives with wife [place]. Has 4 children, 2 in [place], one in [place], one in [place]; 5 grandchildren and 5 great-grandchildren. Attends a [employer].    ADVANCED DIRECTIVES: at the initial visit the patient confirmed his wife is his HCPOA (despite her memory issues).   HEALTH MAINTENANCE: Social History   Tobacco Use   Smoking status: Never   Smokeless tobacco: Never  Vaping Use   Vaping  status: Never Used  Substance Use Topics   Alcohol use: Never   Drug use: Never     Colonoscopy: 6-8 years ago     No Known Allergies  Current Outpatient Medications  Medication Sig Dispense Refill   acyclovir (ZOVIRAX) 400 MG tablet Take 1 tablet (400 mg total) by mouth 2 (two) times daily. 180 tablet 3   allopurinol (ZYLOPRIM) 100 MG tablet  Take 100 mg by mouth daily.     amLODipine (NORVASC) 5 MG tablet Take 5 mg by mouth daily.     calcium carbonate (TUMS - DOSED IN MG ELEMENTAL CALCIUM) 500 MG chewable tablet Chew 3 tablets by mouth daily.     doxazosin (CARDURA) 4 MG tablet Take 4 mg by mouth at bedtime.     famotidine (PEPCID) 20 MG tablet Take 1 tablet by mouth once daily 90 tablet 0   finasteride (PROSCAR) 5 MG tablet Take 1 tablet (5 mg total) by mouth daily. 30 tablet 3   fosinopril (MONOPRIL) 40 MG tablet Take 40 mg by mouth daily.     gabapentin (NEURONTIN) 100 MG capsule TAKE 1 CAPSULE BY MOUTH THREE TIMES DAILY 90 capsule 0   indapamide (LOZOL) 2.5 MG tablet Take 2.5 mg by mouth daily.      Magnesium Oxide 400 MG CAPS Take 400 mg by mouth twice a day for 2 weeks then reduce back to 400 mg daily 180 capsule 1   potassium chloride (KLOR-CON) 10 MEQ tablet Take 2 tablets by mouth once daily 180 tablet 0   pravastatin (PRAVACHOL) 40 MG tablet Take 40 mg by mouth daily.     prednisoLONE acetate (PRED FORTE) 1 % ophthalmic suspension Place 1 drop into the left eye 2 (two) times daily.     tamsulosin (FLOMAX) 0.4 MG CAPS capsule TAKE 1 CAPSULE BY MOUTH ONCE DAILY AFTER SUPPER 90 capsule 0   No current facility-administered medications for this visit.    OBJECTIVE: African-American man in no acute distress Vitals:   09/30/23 1549  BP: 130/60  Pulse: 88  Resp: 18  Temp: (!) 97.2 F (36.2 C)  SpO2: 96%         Body mass index is 25.98 kg/m.   Wt Readings from Last 3 Encounters:  09/30/23 186 lb 4.8 oz (84.5 kg)  07/30/23 183 lb 1.6 oz (83.1 kg)  05/04/23 178 lb 9.6 oz (81 kg)  ECOG FS:1 - Symptomatic but completely ambulatory  Physical Exam Constitutional:      Appearance: Normal appearance.  HENT:     Head: Normocephalic and atraumatic.  Cardiovascular:     Rate and Rhythm: Normal rate and regular rhythm.  Abdominal:     General: Abdomen is flat.     Palpations: Abdomen is soft.  Musculoskeletal:         General: No swelling or tenderness.     Cervical back: Normal range of motion and neck supple.  Neurological:     General: No focal deficit present.     Mental Status: He is alert.  Psychiatric:        Mood and Affect: Mood normal.      LAB RESULTS:  CMP     Component Value Date/Time   NA 139 09/30/2023 1503   NA 137 12/01/2022 1337   NA 140 01/09/2016 1045   K 4.7 09/30/2023 1503   K 3.8 01/09/2016 1045   CL 103 09/30/2023 1503   CO2 31 09/30/2023 1503   CO2 27 01/09/2016 1045  GLUCOSE 84 09/30/2023 1503   GLUCOSE 89 01/09/2016 1045   BUN 26 (H) 09/30/2023 1503   BUN 28 12/01/2022 1337   BUN 14.7 01/09/2016 1045   CREATININE 1.42 (H) 09/30/2023 1503   CREATININE 1.0 01/09/2016 1045   CALCIUM 9.9 09/30/2023 1503   CALCIUM 9.2 01/09/2016 1045   PROT 6.9 09/30/2023 1503   PROT 7.3 01/09/2016 1045   ALBUMIN 3.9 09/30/2023 1503   ALBUMIN 3.7 01/09/2016 1045   AST 15 09/30/2023 1503   AST 18 01/09/2016 1045   ALT 9 09/30/2023 1503   ALT 15 01/09/2016 1045   ALKPHOS 55 09/30/2023 1503   ALKPHOS 60 01/09/2016 1045   BILITOT 0.5 09/30/2023 1503   BILITOT 0.51 01/09/2016 1045   GFRNONAA 47 (L) 09/30/2023 1503   GFRAA >60 08/30/2020 1402    Lab Results  Component Value Date   TOTALPROTELP 6.4 07/30/2023   ALBUMINELP 3.4 12/28/2022   A1GS 0.2 12/28/2022   A2GS 0.6 12/28/2022   BETS 0.7 12/28/2022   BETA2SER 3.6 07/12/2012   GAMS 1.3 12/28/2022   MSPIKE Not Observed 12/28/2022   SPEI * 07/12/2012     Lab Results  Component Value Date   KPAFRELGTCHN 24.8 (H) 07/30/2023   LAMBDASER 12.7 07/30/2023   KAPLAMBRATIO 1.95 (H) 07/30/2023    Lab Results  Component Value Date   WBC 5.7 09/30/2023   NEUTROABS 3.7 09/30/2023   HGB 10.4 (L) 09/30/2023   HCT 33.5 (L) 09/30/2023   MCV 84.0 09/30/2023   PLT 133 (L) 09/30/2023      Chemistry      Component Value Date/Time   NA 139 09/30/2023 1503   NA 137 12/01/2022 1337   NA 140 01/09/2016 1045   K 4.7  09/30/2023 1503   K 3.8 01/09/2016 1045   CL 103 09/30/2023 1503   CO2 31 09/30/2023 1503   CO2 27 01/09/2016 1045   BUN 26 (H) 09/30/2023 1503   BUN 28 12/01/2022 1337   BUN 14.7 01/09/2016 1045   CREATININE 1.42 (H) 09/30/2023 1503   CREATININE 1.0 01/09/2016 1045      Component Value Date/Time   CALCIUM 9.9 09/30/2023 1503   CALCIUM 9.2 01/09/2016 1045   ALKPHOS 55 09/30/2023 1503   ALKPHOS 60 01/09/2016 1045   AST 15 09/30/2023 1503   AST 18 01/09/2016 1045   ALT 9 09/30/2023 1503   ALT 15 01/09/2016 1045   BILITOT 0.5 09/30/2023 1503   BILITOT 0.51 01/09/2016 1045       No results found for: "LABCA2"  No components found for: "[id number]"  No results for input(s): "INR" in the last 168 hours.  No results found for: "LABCA2"  No results found for: "JKK938"  No results found for: "CAN125"  No results found for: "CAN153"  No results found for: "CA2729"  No components found for: "HGQUANT"  No results found for: "CEA1", "CEA" / No results found for: "CEA1", "CEA"   No results found for: "AFPTUMOR"  No results found for: "CHROMOGRNA"  No results found for: "HGBA", "HGBA2QUANT", "HGBFQUANT", "HGBSQUAN" (Hemoglobinopathy evaluation)   Lab Results  Component Value Date   LDH 136 01/09/2016    Lab Results  Component Value Date   IRON 28 (L) 03/26/2022   TIBC 304 03/26/2022   IRONPCTSAT 9 (L) 03/26/2022   (Iron and TIBC)  Lab Results  Component Value Date   FERRITIN 142 05/25/2022    Urinalysis    Component Value Date/Time   COLORURINE AMBER (A) 03/01/2020  1052   APPEARANCEUR CLOUDY (A) 03/01/2020 1052   LABSPEC 1.021 03/01/2020 1052   PHURINE 5.0 03/01/2020 1052   GLUCOSEU NEGATIVE 03/01/2020 1052   HGBUR NEGATIVE 03/01/2020 1052   BILIRUBINUR NEGATIVE 03/01/2020 1052   KETONESUR NEGATIVE 03/01/2020 1052   PROTEINUR 100 (A) 03/01/2020 1052   UROBILINOGEN 0.2 02/29/2020 1537   NITRITE NEGATIVE 03/01/2020 1052   LEUKOCYTESUR NEGATIVE  03/01/2020 1052    STUDIES: No results found.   ELIGIBLE FOR AVAILABLE RESEARCH PROTOCOL:   ASSESSMENT: 89 y.o.  [address] [place] man with a history of M-GUS, presenting 02/29/2020 with a high globulin fraction, worsening renal function, hypercalcemia, anemia and multiple lytic bone lesions.    (1) IgG Kappa Multiple Myeloma: labs diagnostic on 03/01/2020 with Mspike of 3.8, Kappa free light chain 690.6, and ratio 117.05.  (a) CT A/P on 03/01/2020 shows "innumerable" lytic lesions involving all visualized bones  (b) bone marrow biopsy 03/11/2020 shows 20% plasmacytosis by CD138, 12% by manual differential; molecular studies not requested  (c) Bortezomib and Dexamethasone given weekly 3 weeks on and 1 week off beginning 03/11/2020, with good response, last dose 07/24/2020  (d) transitioned to lenalidomide 10 mg daily 21/7 starting 07/31/2020, this was stopped because of worsening cytopenias. He took it for almost 2.5 yrs     (2) lytic bone lesions/ hypercalcemia:  (a) Pamidronate administered on 03/01/2020  (b) denosumab/Xgeva started 04/17/2020-- repeat every 12 weeks   (a) to take TUMS 3 tabs on day of Xgeva dose  PLAN:   Joe [name] is here for follow up.  He denies any new complaints today No concerns on exam today Labs from today with no major concerns. Myeloma pending. He has been getting xgeva every 12 weeks since 2021. No new dental concerns reported. He is overdue for this. His GFR today is low, so will hold off and re evaluate at his next visit. RTC in 8 weeks for FU and labs.  Total time spent: 20 min  *Total Encounter Time as defined by the Centers for Medicare and Medicaid Services includes, in addition to the face-to-face time of a patient visit (documented in the note above) non-face-to-face time: obtaining and reviewing outside history, ordering and reviewing medications, tests or procedures, care coordination (communications with other health care professionals or caregivers) and  documentation in the medical record.

## 2023-10-01 ENCOUNTER — Telehealth: Payer: Self-pay | Admitting: Hematology and Oncology

## 2023-10-01 LAB — KAPPA/LAMBDA LIGHT CHAINS
Kappa free light chain: 24.3 mg/L — ABNORMAL HIGH (ref 3.3–19.4)
Kappa, lambda light chain ratio: 2.25 — ABNORMAL HIGH (ref 0.26–1.65)
Lambda free light chains: 10.8 mg/L (ref 5.7–26.3)

## 2023-10-01 NOTE — Telephone Encounter (Signed)
Spoke with patient confirming upcoming appointment  

## 2023-10-05 LAB — MULTIPLE MYELOMA PANEL, SERUM
Albumin SerPl Elph-Mcnc: 3.7 g/dL (ref 2.9–4.4)
Albumin/Glob SerPl: 1.2 (ref 0.7–1.7)
Alpha 1: 0.2 g/dL (ref 0.0–0.4)
Alpha2 Glob SerPl Elph-Mcnc: 0.7 g/dL (ref 0.4–1.0)
B-Globulin SerPl Elph-Mcnc: 0.9 g/dL (ref 0.7–1.3)
Gamma Glob SerPl Elph-Mcnc: 1.3 g/dL (ref 0.4–1.8)
Globulin, Total: 3.1 g/dL (ref 2.2–3.9)
IgA: 212 mg/dL (ref 61–437)
IgG (Immunoglobin G), Serum: 1388 mg/dL (ref 603–1613)
IgM (Immunoglobulin M), Srm: 57 mg/dL (ref 15–143)
Total Protein ELP: 6.8 g/dL (ref 6.0–8.5)

## 2023-10-07 DIAGNOSIS — C9000 Multiple myeloma not having achieved remission: Secondary | ICD-10-CM | POA: Diagnosis not present

## 2023-10-07 DIAGNOSIS — I10 Essential (primary) hypertension: Secondary | ICD-10-CM | POA: Diagnosis not present

## 2023-10-07 DIAGNOSIS — D509 Iron deficiency anemia, unspecified: Secondary | ICD-10-CM | POA: Diagnosis not present

## 2023-10-07 DIAGNOSIS — E785 Hyperlipidemia, unspecified: Secondary | ICD-10-CM | POA: Diagnosis not present

## 2023-10-11 ENCOUNTER — Other Ambulatory Visit: Payer: Self-pay | Admitting: Hematology and Oncology

## 2023-10-14 DIAGNOSIS — D509 Iron deficiency anemia, unspecified: Secondary | ICD-10-CM | POA: Diagnosis not present

## 2023-10-14 DIAGNOSIS — E785 Hyperlipidemia, unspecified: Secondary | ICD-10-CM | POA: Diagnosis not present

## 2023-10-14 DIAGNOSIS — I10 Essential (primary) hypertension: Secondary | ICD-10-CM | POA: Diagnosis not present

## 2023-10-14 DIAGNOSIS — N400 Enlarged prostate without lower urinary tract symptoms: Secondary | ICD-10-CM | POA: Diagnosis not present

## 2023-10-14 DIAGNOSIS — C9000 Multiple myeloma not having achieved remission: Secondary | ICD-10-CM | POA: Diagnosis not present

## 2023-10-14 DIAGNOSIS — G609 Hereditary and idiopathic neuropathy, unspecified: Secondary | ICD-10-CM | POA: Diagnosis not present

## 2023-10-14 DIAGNOSIS — D472 Monoclonal gammopathy: Secondary | ICD-10-CM | POA: Diagnosis not present

## 2023-10-14 DIAGNOSIS — D61818 Other pancytopenia: Secondary | ICD-10-CM | POA: Diagnosis not present

## 2023-10-14 DIAGNOSIS — M1A09X0 Idiopathic chronic gout, multiple sites, without tophus (tophi): Secondary | ICD-10-CM | POA: Diagnosis not present

## 2023-10-17 ENCOUNTER — Other Ambulatory Visit: Payer: Self-pay | Admitting: Hematology and Oncology

## 2023-10-20 ENCOUNTER — Other Ambulatory Visit: Payer: Self-pay | Admitting: Hematology and Oncology

## 2023-10-20 DIAGNOSIS — C9000 Multiple myeloma not having achieved remission: Secondary | ICD-10-CM

## 2023-11-15 ENCOUNTER — Ambulatory Visit (INDEPENDENT_AMBULATORY_CARE_PROVIDER_SITE_OTHER): Payer: Medicare Other

## 2023-11-15 DIAGNOSIS — I442 Atrioventricular block, complete: Secondary | ICD-10-CM | POA: Diagnosis not present

## 2023-11-15 LAB — CUP PACEART REMOTE DEVICE CHECK
Battery Remaining Longevity: 89 mo
Battery Remaining Percentage: 90 %
Battery Voltage: 3.01 V
Brady Statistic AP VP Percent: 70 %
Brady Statistic AP VS Percent: 1 %
Brady Statistic AS VP Percent: 30 %
Brady Statistic AS VS Percent: 1 %
Brady Statistic RA Percent Paced: 70 %
Brady Statistic RV Percent Paced: 99 %
Date Time Interrogation Session: 20241216034806
Implantable Lead Connection Status: 753985
Implantable Lead Connection Status: 753985
Implantable Lead Implant Date: 20231215
Implantable Lead Implant Date: 20231215
Implantable Lead Location: 753859
Implantable Lead Location: 753860
Implantable Lead Model: 1944
Implantable Lead Model: 1948
Implantable Pulse Generator Implant Date: 20231215
Lead Channel Impedance Value: 490 Ohm
Lead Channel Impedance Value: 550 Ohm
Lead Channel Pacing Threshold Amplitude: 0.5 V
Lead Channel Pacing Threshold Amplitude: 0.5 V
Lead Channel Pacing Threshold Pulse Width: 0.5 ms
Lead Channel Pacing Threshold Pulse Width: 0.5 ms
Lead Channel Sensing Intrinsic Amplitude: 12 mV
Lead Channel Sensing Intrinsic Amplitude: 3.1 mV
Lead Channel Setting Pacing Amplitude: 2.5 V
Lead Channel Setting Pacing Amplitude: 2.5 V
Lead Channel Setting Pacing Pulse Width: 0.5 ms
Lead Channel Setting Sensing Sensitivity: 4 mV
Pulse Gen Model: 2272
Pulse Gen Serial Number: 8129463

## 2023-11-18 ENCOUNTER — Other Ambulatory Visit: Payer: Self-pay | Admitting: Hematology and Oncology

## 2023-11-29 ENCOUNTER — Inpatient Hospital Stay: Payer: Medicare Other

## 2023-11-29 ENCOUNTER — Inpatient Hospital Stay: Payer: Medicare Other | Attending: Hematology and Oncology | Admitting: Hematology and Oncology

## 2023-11-29 VITALS — BP 137/55 | HR 98 | Temp 97.3°F | Resp 18 | Wt 189.9 lb

## 2023-11-29 DIAGNOSIS — C9000 Multiple myeloma not having achieved remission: Secondary | ICD-10-CM

## 2023-11-29 DIAGNOSIS — G629 Polyneuropathy, unspecified: Secondary | ICD-10-CM | POA: Insufficient documentation

## 2023-11-29 DIAGNOSIS — C9001 Multiple myeloma in remission: Secondary | ICD-10-CM | POA: Diagnosis not present

## 2023-11-29 DIAGNOSIS — Z79899 Other long term (current) drug therapy: Secondary | ICD-10-CM | POA: Insufficient documentation

## 2023-11-29 LAB — CBC WITH DIFFERENTIAL/PLATELET
Abs Immature Granulocytes: 0.01 10*3/uL (ref 0.00–0.07)
Basophils Absolute: 0 10*3/uL (ref 0.0–0.1)
Basophils Relative: 0 %
Eosinophils Absolute: 0.1 10*3/uL (ref 0.0–0.5)
Eosinophils Relative: 2 %
HCT: 33.6 % — ABNORMAL LOW (ref 39.0–52.0)
Hemoglobin: 10.7 g/dL — ABNORMAL LOW (ref 13.0–17.0)
Immature Granulocytes: 0 %
Lymphocytes Relative: 18 %
Lymphs Abs: 1 10*3/uL (ref 0.7–4.0)
MCH: 26.4 pg (ref 26.0–34.0)
MCHC: 31.8 g/dL (ref 30.0–36.0)
MCV: 83 fL (ref 80.0–100.0)
Monocytes Absolute: 0.4 10*3/uL (ref 0.1–1.0)
Monocytes Relative: 7 %
Neutro Abs: 4.1 10*3/uL (ref 1.7–7.7)
Neutrophils Relative %: 73 %
Platelets: 143 10*3/uL — ABNORMAL LOW (ref 150–400)
RBC: 4.05 MIL/uL — ABNORMAL LOW (ref 4.22–5.81)
RDW: 12.8 % (ref 11.5–15.5)
WBC: 5.6 10*3/uL (ref 4.0–10.5)
nRBC: 0 % (ref 0.0–0.2)

## 2023-11-29 LAB — CMP (CANCER CENTER ONLY)
ALT: 12 U/L (ref 0–44)
AST: 17 U/L (ref 15–41)
Albumin: 3.8 g/dL (ref 3.5–5.0)
Alkaline Phosphatase: 65 U/L (ref 38–126)
Anion gap: 5 (ref 5–15)
BUN: 22 mg/dL (ref 8–23)
CO2: 32 mmol/L (ref 22–32)
Calcium: 9.8 mg/dL (ref 8.9–10.3)
Chloride: 102 mmol/L (ref 98–111)
Creatinine: 1.24 mg/dL (ref 0.61–1.24)
GFR, Estimated: 55 mL/min — ABNORMAL LOW (ref >60)
Glucose, Bld: 92 mg/dL (ref 70–99)
Potassium: 3.7 mmol/L (ref 3.5–5.1)
Sodium: 139 mmol/L (ref 135–145)
Total Bilirubin: 0.5 mg/dL (ref 0.0–1.2)
Total Protein: 6.9 g/dL (ref 6.5–8.1)

## 2023-11-29 MED ORDER — POTASSIUM CHLORIDE ER 10 MEQ PO TBCR: 20.0000 meq | EXTENDED_RELEASE_TABLET | Freq: Every day | ORAL | 0 refills | Status: DC

## 2023-11-29 NOTE — Progress Notes (Signed)
[facility]  Telephone:(336) [phone] Fax:(336) [phone]     ID: [name] DOB: 1929-10-13  MR#: [id number]  JYN#:[id number]  Patient Care Team: Georgianne Fick, MD as PCP - General (Internal Medicine) Duke Salvia, MD as PCP - Electrophysiology (Cardiology) Dyanne Iha, Idamae Schuller, MD as Consulting Physician (Hematology and Oncology) Rachel Moulds, MD  CHIEF COMPLAINT: multiple myeloma  CURRENT TREATMENT: denosumab Xgeva every 12 weeks; lenalidomide 10 mg 21/7  INTERVAL HISTORY:  Patient is here for follow-up. He is currently taking gabapentin 100mg  and applying a topical cream at night for symptom management. Despite these interventions, the neuropathy remains his most significant issue. He also reports a persistent cold or sinus issue, which he attributes to allergies. He denies fevers, night sweats, and changes in appetite or weight. In fact, he believes he may be gaining weight. He reports no issues with breathing, bowel movements, or urination. The patient is also caring for their spouse who has dementia.   Lab Results  Component Value Date   KAPLAMBRATIO 2.25 (H) 09/30/2023   KAPLAMBRATIO 1.95 (H) 07/30/2023   KAPLAMBRATIO 1.98 (H) 05/04/2023   KAPLAMBRATIO 2.28 (H) 03/08/2023   KAPLAMBRATIO 2.23 (H) 12/28/2022    REVIEW OF SYSTEMS:  Rest of the pertinent 10 point ROS reviewed and negative   COVID 19 VACCINATION STATUS: received Pfizer x2 with booster October 2021.  He received evusheld on 03/20/2021 and is due a second dose   HISTORY OF CURRENT ILLNESS:  From the original intake note:  Mr. Berding is an 89 y/o [place] man formerly followed by my partner Dr Arbutus Ped for M-GUS. We have an M-Spike of 0.87 documented 07/12/2012. [name] kappa/lambda light chains and total IgG were follower yearly until 01/2016, when they were 2.36 and 1517 respectively (unremarkable). He was lost to follow-up after that point.  [02/29/2020] the patient  presented to Urgent Care with poorly controlled pain. This was felt to be mussculoskeletal and he was started on a medrol dose-pak and robaxin. However his pain worsened and he presented to the ED at [facility] where vitals and exam were unremarkable but labs showed a creatinine of 1.84 (baseline 1.0), calcium 12.7 with albumin 2.8 and total protein 10.4 (globulin fraction 7.8).  CT of the abdomen obtained for evaluation of his abdominal and back pain showed no hydronephrosis but multiple lytic lesons.   The patient's subsequent history is as detailed below.  PAST MEDICAL HISTORY: Past Medical History:  Diagnosis Date   Enlarged prostate    Hypertension    Melanoma (HCC) 2021   Multiple myeloma (HCC)     PAST SURGICAL HISTORY: Past Surgical History:  Procedure Laterality Date   EYE SURGERY     PACEMAKER IMPLANT N/A 11/13/2022   Procedure: PACEMAKER IMPLANT;  Surgeon: Duke Salvia, MD;  Location: [facility] INVASIVE CV LAB;  Service: Cardiovascular;  Laterality: N/A;   PROSTATE SURGERY      FAMILY HISTORY Family History  Problem Relation Age of Onset   Hypertension Mother     SOCIAL HISTORY:  Retired, used to work for the [employer]. Lives with wife [place]. Has 4 children, 2 in [place], one in [place], one in [place]; 5 grandchildren and 5 great-grandchildren. Attends a [employer].    ADVANCED DIRECTIVES: at the initial visit the patient confirmed his wife is his HCPOA (despite her memory issues).   HEALTH MAINTENANCE: Social History   Tobacco Use   Smoking status: Never   Smokeless tobacco: Never  Vaping  Use   Vaping status: Never Used  Substance Use Topics   Alcohol use: Never   Drug use: Never     Colonoscopy: 6-8 years ago     No Known Allergies  Current Outpatient Medications  Medication Sig Dispense Refill   acyclovir (ZOVIRAX) 400 MG tablet Take 1 tablet (400 mg total) by mouth 2 (two) times daily. 180 tablet 3    allopurinol (ZYLOPRIM) 100 MG tablet Take 100 mg by mouth daily.     amLODipine (NORVASC) 5 MG tablet Take 5 mg by mouth daily.     calcium carbonate (TUMS - DOSED IN MG ELEMENTAL CALCIUM) 500 MG chewable tablet Chew 3 tablets by mouth daily.     doxazosin (CARDURA) 4 MG tablet Take 4 mg by mouth at bedtime.     famotidine (PEPCID) 20 MG tablet Take 1 tablet by mouth once daily 90 tablet 0   finasteride (PROSCAR) 5 MG tablet Take 1 tablet (5 mg total) by mouth daily. 30 tablet 3   fosinopril (MONOPRIL) 40 MG tablet Take 40 mg by mouth daily.     gabapentin (NEURONTIN) 100 MG capsule TAKE 1 CAPSULE BY MOUTH THREE TIMES DAILY 90 capsule 0   indapamide (LOZOL) 2.5 MG tablet Take 2.5 mg by mouth daily.      Magnesium Oxide 400 MG CAPS Take 400 mg by mouth twice a day for 2 weeks then reduce back to 400 mg daily 180 capsule 1   potassium chloride (KLOR-CON) 10 MEQ tablet Take 2 tablets by mouth once daily 180 tablet 0   pravastatin (PRAVACHOL) 40 MG tablet Take 40 mg by mouth daily.     prednisoLONE acetate (PRED FORTE) 1 % ophthalmic suspension Place 1 drop into the left eye 2 (two) times daily.     tamsulosin (FLOMAX) 0.4 MG CAPS capsule TAKE 1 CAPSULE BY MOUTH ONCE DAILY AFTER SUPPER 90 capsule 0   No current facility-administered medications for this visit.    OBJECTIVE: African-American man in no acute distress Vitals:   11/29/23 1158  BP: (!) 137/55  Pulse: 98  Resp: 18  Temp: (!) 97.3 F (36.3 C)  SpO2: 97%         Body mass index is 26.49 kg/m.   Wt Readings from Last 3 Encounters:  11/29/23 189 lb 14.4 oz (86.1 kg)  09/30/23 186 lb 4.8 oz (84.5 kg)  07/30/23 183 lb 1.6 oz (83.1 kg)  ECOG FS:1 - Symptomatic but completely ambulatory  Physical Exam Constitutional:      Appearance: Normal appearance.  HENT:     Head: Normocephalic and atraumatic.  Cardiovascular:     Rate and Rhythm: Normal rate and regular rhythm.  Abdominal:     General: Abdomen is flat.      Palpations: Abdomen is soft.  Musculoskeletal:        General: No swelling or tenderness.     Cervical back: Normal range of motion and neck supple.  Neurological:     General: No focal deficit present.     Mental Status: He is alert.  Psychiatric:        Mood and Affect: Mood normal.      LAB RESULTS:  CMP     Component Value Date/Time   NA 139 09/30/2023 1503   NA 137 12/01/2022 1337   NA 140 01/09/2016 1045   K 4.7 09/30/2023 1503   K 3.8 01/09/2016 1045   CL 103 09/30/2023 1503   CO2 31 09/30/2023 1503  CO2 27 01/09/2016 1045   GLUCOSE 84 09/30/2023 1503   GLUCOSE 89 01/09/2016 1045   BUN 26 (H) 09/30/2023 1503   BUN 28 12/01/2022 1337   BUN 14.7 01/09/2016 1045   CREATININE 1.42 (H) 09/30/2023 1503   CREATININE 1.0 01/09/2016 1045   CALCIUM 9.9 09/30/2023 1503   CALCIUM 9.2 01/09/2016 1045   PROT 6.9 09/30/2023 1503   PROT 7.3 01/09/2016 1045   ALBUMIN 3.9 09/30/2023 1503   ALBUMIN 3.7 01/09/2016 1045   AST 15 09/30/2023 1503   AST 18 01/09/2016 1045   ALT 9 09/30/2023 1503   ALT 15 01/09/2016 1045   ALKPHOS 55 09/30/2023 1503   ALKPHOS 60 01/09/2016 1045   BILITOT 0.5 09/30/2023 1503   BILITOT 0.51 01/09/2016 1045   GFRNONAA 47 (L) 09/30/2023 1503   GFRAA >60 08/30/2020 1402    Lab Results  Component Value Date   TOTALPROTELP 6.8 09/30/2023   ALBUMINELP 3.4 12/28/2022   A1GS 0.2 12/28/2022   A2GS 0.6 12/28/2022   BETS 0.7 12/28/2022   BETA2SER 3.6 07/12/2012   GAMS 1.3 12/28/2022   MSPIKE Not Observed 12/28/2022   SPEI * 07/12/2012     Lab Results  Component Value Date   KPAFRELGTCHN 24.3 (H) 09/30/2023   LAMBDASER 10.8 09/30/2023   KAPLAMBRATIO 2.25 (H) 09/30/2023    Lab Results  Component Value Date   WBC 5.7 09/30/2023   NEUTROABS 3.7 09/30/2023   HGB 10.4 (L) 09/30/2023   HCT 33.5 (L) 09/30/2023   MCV 84.0 09/30/2023   PLT 133 (L) 09/30/2023      Chemistry      Component Value Date/Time   NA 139 09/30/2023 1503   NA 137  12/01/2022 1337   NA 140 01/09/2016 1045   K 4.7 09/30/2023 1503   K 3.8 01/09/2016 1045   CL 103 09/30/2023 1503   CO2 31 09/30/2023 1503   CO2 27 01/09/2016 1045   BUN 26 (H) 09/30/2023 1503   BUN 28 12/01/2022 1337   BUN 14.7 01/09/2016 1045   CREATININE 1.42 (H) 09/30/2023 1503   CREATININE 1.0 01/09/2016 1045      Component Value Date/Time   CALCIUM 9.9 09/30/2023 1503   CALCIUM 9.2 01/09/2016 1045   ALKPHOS 55 09/30/2023 1503   ALKPHOS 60 01/09/2016 1045   AST 15 09/30/2023 1503   AST 18 01/09/2016 1045   ALT 9 09/30/2023 1503   ALT 15 01/09/2016 1045   BILITOT 0.5 09/30/2023 1503   BILITOT 0.51 01/09/2016 1045       No results found for: "LABCA2"  No components found for: "[id number]"  No results for input(s): "INR" in the last 168 hours.  No results found for: "LABCA2"  No results found for: "XBJ478"  No results found for: "CAN125"  No results found for: "CAN153"  No results found for: "CA2729"  No components found for: "HGQUANT"  No results found for: "CEA1", "CEA" / No results found for: "CEA1", "CEA"   No results found for: "AFPTUMOR"  No results found for: "CHROMOGRNA"  No results found for: "HGBA", "HGBA2QUANT", "HGBFQUANT", "HGBSQUAN" (Hemoglobinopathy evaluation)   Lab Results  Component Value Date   LDH 136 01/09/2016    Lab Results  Component Value Date   IRON 28 (L) 03/26/2022   TIBC 304 03/26/2022   IRONPCTSAT 9 (L) 03/26/2022   (Iron and TIBC)  Lab Results  Component Value Date   FERRITIN 142 05/25/2022    Urinalysis    Component Value Date/Time  COLORURINE AMBER (A) 03/01/2020 1052   APPEARANCEUR CLOUDY (A) 03/01/2020 1052   LABSPEC 1.021 03/01/2020 1052   PHURINE 5.0 03/01/2020 1052   GLUCOSEU NEGATIVE 03/01/2020 1052   HGBUR NEGATIVE 03/01/2020 1052   BILIRUBINUR NEGATIVE 03/01/2020 1052   KETONESUR NEGATIVE 03/01/2020 1052   PROTEINUR 100 (A) 03/01/2020 1052   UROBILINOGEN 0.2 02/29/2020 1537   NITRITE  NEGATIVE 03/01/2020 1052   LEUKOCYTESUR NEGATIVE 03/01/2020 1052    STUDIES: CUP PACEART REMOTE DEVICE CHECK Result Date: 11/15/2023 Scheduled remote reviewed. Normal device function.  1 AMS, 6 seconds. Next remote 91 days. [place], CV[facility]    ELIGIBLE FOR AVAILABLE RESEARCH PROTOCOL:   ASSESSMENT: 89 y.o.  59 [place] man with a history of M-GUS, presenting 02/29/2020 with a high globulin fraction, worsening renal function, hypercalcemia, anemia and multiple lytic bone lesions.    (1) IgG Kappa Multiple Myeloma: labs diagnostic on 03/01/2020 with Mspike of 3.8, Kappa free light chain 690.6, and ratio 117.05.  (a) CT A/P on 03/01/2020 shows "innumerable" lytic lesions involving all visualized bones  (b) bone marrow biopsy 03/11/2020 shows 20% plasmacytosis by CD138, 12% by manual differential; molecular studies not requested  (c) Bortezomib and Dexamethasone given weekly 3 weeks on and 1 week off beginning 03/11/2020, with good response, last dose 07/24/2020  (d) transitioned to lenalidomide 10 mg daily 21/7 starting 07/31/2020, this was stopped because of worsening cytopenias. He took it for almost 2.5 yrs     (2) lytic bone lesions/ hypercalcemia:  (a) Pamidronate administered on 03/01/2020  (b) denosumab/Xgeva started 04/17/2020-- repeat every 12 weeks   (a) to take TUMS 3 tabs on day of Xgeva dose  PLAN:   MM in remission. Will follow up on labs from today. We will resume Xgeva again. Will send a scheduling message.  Peripheral Neuropathy Patient reports neuropathy as the most significant issue. Currently on Gabapentin 100mg  with limited relief. -Increase Gabapentin to 200mg  at night.  Upper Respiratory Symptoms Possible allergies or sinus issues. No fever or other systemic symptoms. -Continue current management.  Potassium Supplementation Patient confirms ongoing use. -Refill Potassium prescription to [facility].  General Health Maintenance Patient is 89 years old  and in good overall health. No new complaints. Blood pressure well controlled. -Order myeloma blood work today. -Follow-up based on lab results.  Total time spent: 20 min  *Total Encounter Time as defined by the Centers for Medicare and Medicaid Services includes, in addition to the face-to-face time of a patient visit (documented in the note above) non-face-to-face time: obtaining and reviewing outside history, ordering and reviewing medications, tests or procedures, care coordination (communications with other health care professionals or caregivers) and documentation in the medical record.

## 2023-11-30 ENCOUNTER — Telehealth: Payer: Self-pay | Admitting: Hematology and Oncology

## 2023-11-30 LAB — KAPPA/LAMBDA LIGHT CHAINS
Kappa free light chain: 23.7 mg/L — ABNORMAL HIGH (ref 3.3–19.4)
Kappa, lambda light chain ratio: 2.32 — ABNORMAL HIGH (ref 0.26–1.65)
Lambda free light chains: 10.2 mg/L (ref 5.7–26.3)

## 2023-11-30 NOTE — Telephone Encounter (Signed)
Spoke with patient confirming upcoming appointment  

## 2023-12-05 ENCOUNTER — Other Ambulatory Visit: Payer: Self-pay | Admitting: Hematology and Oncology

## 2023-12-05 DIAGNOSIS — D472 Monoclonal gammopathy: Secondary | ICD-10-CM

## 2023-12-06 ENCOUNTER — Encounter: Payer: Self-pay | Admitting: Oncology

## 2023-12-06 ENCOUNTER — Other Ambulatory Visit: Payer: Self-pay | Admitting: Hematology and Oncology

## 2023-12-07 DIAGNOSIS — Z970 Presence of artificial eye: Secondary | ICD-10-CM | POA: Diagnosis not present

## 2023-12-07 DIAGNOSIS — Z961 Presence of intraocular lens: Secondary | ICD-10-CM | POA: Diagnosis not present

## 2023-12-07 DIAGNOSIS — H52202 Unspecified astigmatism, left eye: Secondary | ICD-10-CM | POA: Diagnosis not present

## 2023-12-07 LAB — MULTIPLE MYELOMA PANEL, SERUM
Albumin SerPl Elph-Mcnc: 3.7 g/dL (ref 2.9–4.4)
Albumin/Glob SerPl: 1.4 (ref 0.7–1.7)
Alpha 1: 0.2 g/dL (ref 0.0–0.4)
Alpha2 Glob SerPl Elph-Mcnc: 0.6 g/dL (ref 0.4–1.0)
B-Globulin SerPl Elph-Mcnc: 0.8 g/dL (ref 0.7–1.3)
Gamma Glob SerPl Elph-Mcnc: 1.2 g/dL (ref 0.4–1.8)
Globulin, Total: 2.8 g/dL (ref 2.2–3.9)
IgA: 225 mg/dL (ref 61–437)
IgG (Immunoglobin G), Serum: 1308 mg/dL (ref 603–1613)
IgM (Immunoglobulin M), Srm: 60 mg/dL (ref 15–143)
Total Protein ELP: 6.5 g/dL (ref 6.0–8.5)

## 2023-12-09 ENCOUNTER — Inpatient Hospital Stay: Payer: Medicare Other | Attending: Hematology and Oncology

## 2023-12-09 DIAGNOSIS — G629 Polyneuropathy, unspecified: Secondary | ICD-10-CM | POA: Diagnosis not present

## 2023-12-09 DIAGNOSIS — C9001 Multiple myeloma in remission: Secondary | ICD-10-CM | POA: Diagnosis not present

## 2023-12-09 DIAGNOSIS — C9000 Multiple myeloma not having achieved remission: Secondary | ICD-10-CM

## 2023-12-09 DIAGNOSIS — Z79899 Other long term (current) drug therapy: Secondary | ICD-10-CM | POA: Diagnosis not present

## 2023-12-09 DIAGNOSIS — Z7189 Other specified counseling: Secondary | ICD-10-CM

## 2023-12-09 LAB — CBC WITH DIFFERENTIAL/PLATELET
Abs Immature Granulocytes: 0.01 10*3/uL (ref 0.00–0.07)
Basophils Absolute: 0 10*3/uL (ref 0.0–0.1)
Basophils Relative: 0 %
Eosinophils Absolute: 0.1 10*3/uL (ref 0.0–0.5)
Eosinophils Relative: 3 %
HCT: 34.4 % — ABNORMAL LOW (ref 39.0–52.0)
Hemoglobin: 10.9 g/dL — ABNORMAL LOW (ref 13.0–17.0)
Immature Granulocytes: 0 %
Lymphocytes Relative: 24 %
Lymphs Abs: 1.3 10*3/uL (ref 0.7–4.0)
MCH: 26.1 pg (ref 26.0–34.0)
MCHC: 31.7 g/dL (ref 30.0–36.0)
MCV: 82.5 fL (ref 80.0–100.0)
Monocytes Absolute: 0.5 10*3/uL (ref 0.1–1.0)
Monocytes Relative: 10 %
Neutro Abs: 3.3 10*3/uL (ref 1.7–7.7)
Neutrophils Relative %: 63 %
Platelets: 163 10*3/uL (ref 150–400)
RBC: 4.17 MIL/uL — ABNORMAL LOW (ref 4.22–5.81)
RDW: 13 % (ref 11.5–15.5)
WBC: 5.3 10*3/uL (ref 4.0–10.5)
nRBC: 0 % (ref 0.0–0.2)

## 2023-12-09 LAB — BASIC METABOLIC PANEL - CANCER CENTER ONLY
Anion gap: 5 (ref 5–15)
BUN: 27 mg/dL — ABNORMAL HIGH (ref 8–23)
CO2: 32 mmol/L (ref 22–32)
Calcium: 9.8 mg/dL (ref 8.9–10.3)
Chloride: 103 mmol/L (ref 98–111)
Creatinine: 1.52 mg/dL — ABNORMAL HIGH (ref 0.61–1.24)
GFR, Estimated: 43 mL/min — ABNORMAL LOW (ref >60)
Glucose, Bld: 91 mg/dL (ref 70–99)
Potassium: 3.8 mmol/L (ref 3.5–5.1)
Sodium: 140 mmol/L (ref 135–145)

## 2023-12-09 MED ORDER — DENOSUMAB 120 MG/1.7ML ~~LOC~~ SOLN
120.0000 mg | Freq: Once | SUBCUTANEOUS | Status: AC
  Administered 2023-12-09: 120 mg via SUBCUTANEOUS
  Filled 2023-12-09: qty 1.7

## 2023-12-14 ENCOUNTER — Other Ambulatory Visit: Payer: Self-pay | Admitting: Hematology and Oncology

## 2023-12-20 NOTE — Progress Notes (Signed)
Remote pacemaker transmission.   

## 2023-12-31 ENCOUNTER — Other Ambulatory Visit: Payer: Self-pay | Admitting: Hematology and Oncology

## 2023-12-31 DIAGNOSIS — D472 Monoclonal gammopathy: Secondary | ICD-10-CM

## 2024-01-10 ENCOUNTER — Other Ambulatory Visit: Payer: Self-pay | Admitting: Hematology and Oncology

## 2024-01-12 ENCOUNTER — Other Ambulatory Visit: Payer: Self-pay | Admitting: Hematology and Oncology

## 2024-01-17 ENCOUNTER — Other Ambulatory Visit: Payer: Self-pay | Admitting: Hematology and Oncology

## 2024-01-19 ENCOUNTER — Other Ambulatory Visit: Payer: Self-pay | Admitting: Physician Assistant

## 2024-01-24 ENCOUNTER — Other Ambulatory Visit: Payer: Self-pay | Admitting: Hematology and Oncology

## 2024-01-24 DIAGNOSIS — D472 Monoclonal gammopathy: Secondary | ICD-10-CM

## 2024-01-26 NOTE — Progress Notes (Unsigned)
 [facility]  [phone] [phone]     ID: Joe [name] DOB: 12-Jan-1929  MR#: [id number]  MVH#:[id number]  Patient Care Team: Georgianne Fick, MD as PCP - General (Internal Medicine) Duke Salvia, MD as PCP - Electrophysiology (Cardiology) Dyanne Iha, Idamae Schuller, MD as Consulting Physician (Hematology and Oncology) Rachel Moulds, MD  CHIEF COMPLAINT: multiple myeloma  CURRENT TREATMENT: denosumab Xgeva every 12 weeks; lenalidomide 10 mg 21/7  INTERVAL HISTORY:  Patient is here for follow-up. He is currently taking gabapentin 100mg  and applying a topical cream at night for symptom management. Despite these interventions, the neuropathy remains his most significant issue.   Lab Results  Component Value Date   KAPLAMBRATIO 2.32 (H) 11/29/2023   KAPLAMBRATIO 2.25 (H) 09/30/2023   KAPLAMBRATIO 1.95 (H) 07/30/2023   KAPLAMBRATIO 1.98 (H) 05/04/2023   KAPLAMBRATIO 2.28 (H) 03/08/2023    REVIEW OF SYSTEMS:  Rest of the pertinent 10 point ROS reviewed and negative   COVID 19 VACCINATION STATUS: received Pfizer x2 with booster October 2021.  He received evusheld on 03/20/2021 and is due a second dose   HISTORY OF CURRENT ILLNESS:  From the original intake note:  Joe [name] is an 89 y/o [place] man formerly followed by my partner Dr Arbutus Ped for M-GUS. We have an M-Spike of 0.87 documented 07/12/2012. [name] kappa/lambda light chains and total IgG were follower yearly until 01/2016, when they were 2.36 and 1517 respectively (unremarkable). He was lost to follow-up after that point.  [02/29/2020] the patient presented to Urgent Care with poorly controlled pain. This was felt to be mussculoskeletal and he was started on a medrol dose-pak and robaxin. However his pain worsened and he presented to the ED at [facility] where vitals and exam were unremarkable but labs showed a creatinine of 1.84 (baseline 1.0),  calcium 12.7 with albumin 2.8 and total protein 10.4 (globulin fraction 7.8).  CT of the abdomen obtained for evaluation of his abdominal and back pain showed no hydronephrosis but multiple lytic lesons.   The patient's subsequent history is as detailed below.  PAST MEDICAL HISTORY: Past Medical History:  Diagnosis Date   Enlarged prostate    Hypertension    Melanoma (HCC) 2021   Multiple myeloma (HCC)     PAST SURGICAL HISTORY: Past Surgical History:  Procedure Laterality Date   EYE SURGERY     PACEMAKER IMPLANT N/A 11/13/2022   Procedure: PACEMAKER IMPLANT;  Surgeon: Duke Salvia, MD;  Location: [facility] INVASIVE CV LAB;  Service: Cardiovascular;  Laterality: N/A;   PROSTATE SURGERY      FAMILY HISTORY Family History  Problem Relation Age of Onset   Hypertension Mother     SOCIAL HISTORY:  Retired, used to work for the [employer]. Lives with wife [place]. Has 4 children, 2 in [place], one in [place], one in [place]; 5 grandchildren and 5 great-grandchildren. Attends a [employer].    ADVANCED DIRECTIVES: at the initial visit the patient confirmed his wife is his HCPOA (despite her memory issues).   HEALTH MAINTENANCE: Social History   Tobacco Use   Smoking status: Never   Smokeless tobacco: Never  Vaping Use   Vaping status: Never Used  Substance Use Topics   Alcohol use: Never   Drug use: Never     Colonoscopy: 6-8 years ago     No Known Allergies  Current Outpatient Medications  Medication Sig Dispense Refill   acyclovir (ZOVIRAX) 400 MG tablet Take 1 tablet (  400 mg total) by mouth 2 (two) times daily. 180 tablet 3   allopurinol (ZYLOPRIM) 100 MG tablet Take 100 mg by mouth daily.     amLODipine (NORVASC) 5 MG tablet Take 5 mg by mouth daily.     calcium carbonate (TUMS - DOSED IN MG ELEMENTAL CALCIUM) 500 MG chewable tablet Chew 3 tablets by mouth daily.     doxazosin (CARDURA) 4 MG tablet Take 4 mg by mouth at bedtime.      famotidine (PEPCID) 20 MG tablet Take 1 tablet by mouth once daily 90 tablet 0   finasteride (PROSCAR) 5 MG tablet Take 1 tablet by mouth once daily 30 tablet 0   fosinopril (MONOPRIL) 40 MG tablet Take 40 mg by mouth daily.     gabapentin (NEURONTIN) 100 MG capsule TAKE 1 CAPSULE BY MOUTH THREE TIMES DAILY 90 capsule 0   indapamide (LOZOL) 2.5 MG tablet Take 2.5 mg by mouth daily.      MAGNESIUM-OXIDE 400 (240 Mg) MG tablet TAKE 1 TABLET BY MOUTH TWICE DAILY FOR 2 WEEKS THEN REDUCE BACK TO 1 TABLET DAILY 180 tablet 0   potassium chloride (KLOR-CON) 10 MEQ tablet Take 2 tablets (20 mEq total) by mouth daily. 180 tablet 0   pravastatin (PRAVACHOL) 40 MG tablet Take 40 mg by mouth daily.     prednisoLONE acetate (PRED FORTE) 1 % ophthalmic suspension Place 1 drop into the left eye 2 (two) times daily.     tamsulosin (FLOMAX) 0.4 MG CAPS capsule TAKE 1 CAPSULE BY MOUTH ONCE DAILY AFTER SUPPER 90 capsule 0   No current facility-administered medications for this visit.    OBJECTIVE: African-American man in no acute distress There were no vitals filed for this visit.        There is no height or weight on file to calculate BMI.   Wt Readings from Last 3 Encounters:  11/29/23 189 lb 14.4 oz (86.1 kg)  09/30/23 186 lb 4.8 oz (84.5 kg)  07/30/23 183 lb 1.6 oz (83.1 kg)  ECOG FS:1 - Symptomatic but completely ambulatory  Physical Exam Constitutional:      Appearance: Normal appearance.  HENT:     Head: Normocephalic and atraumatic.  Cardiovascular:     Rate and Rhythm: Normal rate and regular rhythm.  Abdominal:     General: Abdomen is flat.     Palpations: Abdomen is soft.  Musculoskeletal:        General: No swelling or tenderness.     Cervical back: Normal range of motion and neck supple.  Neurological:     General: No focal deficit present.     Mental Status: He is alert.  Psychiatric:        Mood and Affect: Mood normal.      LAB RESULTS:  CMP     Component Value  Date/Time   NA 140 12/09/2023 1238   NA 137 12/01/2022 1337   NA 140 01/09/2016 1045   K 3.8 12/09/2023 1238   K 3.8 01/09/2016 1045   CL 103 12/09/2023 1238   CO2 32 12/09/2023 1238   CO2 27 01/09/2016 1045   GLUCOSE 91 12/09/2023 1238   GLUCOSE 89 01/09/2016 1045   BUN 27 (H) 12/09/2023 1238   BUN 28 12/01/2022 1337   BUN 14.7 01/09/2016 1045   CREATININE 1.52 (H) 12/09/2023 1238   CREATININE 1.0 01/09/2016 1045   CALCIUM 9.8 12/09/2023 1238   CALCIUM 9.2 01/09/2016 1045   PROT 6.9 11/29/2023 1231  PROT 7.3 01/09/2016 1045   ALBUMIN 3.8 11/29/2023 1231   ALBUMIN 3.7 01/09/2016 1045   AST 17 11/29/2023 1231   AST 18 01/09/2016 1045   ALT 12 11/29/2023 1231   ALT 15 01/09/2016 1045   ALKPHOS 65 11/29/2023 1231   ALKPHOS 60 01/09/2016 1045   BILITOT 0.5 11/29/2023 1231   BILITOT 0.51 01/09/2016 1045   GFRNONAA 43 (L) 12/09/2023 1238   GFRAA >60 08/30/2020 1402    Lab Results  Component Value Date   TOTALPROTELP 6.5 11/29/2023   ALBUMINELP 3.4 12/28/2022   A1GS 0.2 12/28/2022   A2GS 0.6 12/28/2022   BETS 0.7 12/28/2022   BETA2SER 3.6 07/12/2012   GAMS 1.3 12/28/2022   MSPIKE Not Observed 12/28/2022   SPEI * 07/12/2012     Lab Results  Component Value Date   KPAFRELGTCHN 23.7 (H) 11/29/2023   LAMBDASER 10.2 11/29/2023   KAPLAMBRATIO 2.32 (H) 11/29/2023    Lab Results  Component Value Date   WBC 5.3 12/09/2023   NEUTROABS 3.3 12/09/2023   HGB 10.9 (L) 12/09/2023   HCT 34.4 (L) 12/09/2023   MCV 82.5 12/09/2023   PLT 163 12/09/2023      Chemistry      Component Value Date/Time   NA 140 12/09/2023 1238   NA 137 12/01/2022 1337   NA 140 01/09/2016 1045   K 3.8 12/09/2023 1238   K 3.8 01/09/2016 1045   CL 103 12/09/2023 1238   CO2 32 12/09/2023 1238   CO2 27 01/09/2016 1045   BUN 27 (H) 12/09/2023 1238   BUN 28 12/01/2022 1337   BUN 14.7 01/09/2016 1045   CREATININE 1.52 (H) 12/09/2023 1238   CREATININE 1.0 01/09/2016 1045      Component Value  Date/Time   CALCIUM 9.8 12/09/2023 1238   CALCIUM 9.2 01/09/2016 1045   ALKPHOS 65 11/29/2023 1231   ALKPHOS 60 01/09/2016 1045   AST 17 11/29/2023 1231   AST 18 01/09/2016 1045   ALT 12 11/29/2023 1231   ALT 15 01/09/2016 1045   BILITOT 0.5 11/29/2023 1231   BILITOT 0.51 01/09/2016 1045       No results found for: "LABCA2"  No components found for: "ZOXWRU045"  No results for input(s): "INR" in the last 168 hours.  No results found for: "LABCA2"  No results found for: "WUJ811"  No results found for: "CAN125"  No results found for: "CAN153"  No results found for: "CA2729"  No components found for: "HGQUANT"  No results found for: "CEA1", "CEA" / No results found for: "CEA1", "CEA"   No results found for: "AFPTUMOR"  No results found for: "CHROMOGRNA"  No results found for: "HGBA", "HGBA2QUANT", "HGBFQUANT", "HGBSQUAN" (Hemoglobinopathy evaluation)   Lab Results  Component Value Date   LDH 136 01/09/2016    Lab Results  Component Value Date   IRON 28 (L) 03/26/2022   TIBC 304 03/26/2022   IRONPCTSAT 9 (L) 03/26/2022   (Iron and TIBC)  Lab Results  Component Value Date   FERRITIN 142 05/25/2022    Urinalysis    Component Value Date/Time   COLORURINE AMBER (A) 03/01/2020 1052   APPEARANCEUR CLOUDY (A) 03/01/2020 1052   LABSPEC 1.021 03/01/2020 1052   PHURINE 5.0 03/01/2020 1052   GLUCOSEU NEGATIVE 03/01/2020 1052   HGBUR NEGATIVE 03/01/2020 1052   BILIRUBINUR NEGATIVE 03/01/2020 1052   KETONESUR NEGATIVE 03/01/2020 1052   PROTEINUR 100 (A) 03/01/2020 1052   UROBILINOGEN 0.2 02/29/2020 1537   NITRITE NEGATIVE 03/01/2020 1052  LEUKOCYTESUR NEGATIVE 03/01/2020 1052    STUDIES: No results found.    ELIGIBLE FOR AVAILABLE RESEARCH PROTOCOL:   ASSESSMENT: 89 y.o.  80 [place] man with a history of M-GUS, presenting 02/29/2020 with a high globulin fraction, worsening renal function, hypercalcemia, anemia and multiple lytic bone lesions.     (1) IgG Kappa Multiple Myeloma: labs diagnostic on 03/01/2020 with Mspike of 3.8, Kappa free light chain 690.6, and ratio 117.05.  (a) CT A/P on 03/01/2020 shows "innumerable" lytic lesions involving all visualized bones  (b) bone marrow biopsy 03/11/2020 shows 20% plasmacytosis by CD138, 12% by manual differential; molecular studies not requested  (c) Bortezomib and Dexamethasone given weekly 3 weeks on and 1 week off beginning 03/11/2020, with good response, last dose 07/24/2020  (d) transitioned to lenalidomide 10 mg daily 21/7 starting 07/31/2020, this was stopped because of worsening cytopenias. He took it for almost 2.5 yrs     (2) lytic bone lesions/ hypercalcemia:  (a) Pamidronate administered on 03/01/2020  (b) denosumab/Xgeva started 04/17/2020-- repeat every 12 weeks   (a) to take TUMS 3 tabs on day of Xgeva dose  PLAN:   MM in remission. Will follow up on labs from today. We will resume Xgeva again. Will send a scheduling message.  Peripheral Neuropathy Patient reports neuropathy as the most significant issue. Currently on Gabapentin 100mg  with limited relief. -Increase Gabapentin to 200mg  at night.  Upper Respiratory Symptoms Possible allergies or sinus issues. No fever or other systemic symptoms. -Continue current management.  Potassium Supplementation Patient confirms ongoing use. -Refill Potassium prescription to [facility].  General Health Maintenance Patient is 89 years old and in good overall health. No new complaints. Blood pressure well controlled. -Order myeloma blood work today. -Follow-up based on lab results.  Total time spent: 20 min  *Total Encounter Time as defined by the Centers for Medicare and Medicaid Services includes, in addition to the face-to-face time of a patient visit (documented in the note above) non-face-to-face time: obtaining and reviewing outside history, ordering and reviewing medications, tests or procedures, care  coordination (communications with other health care professionals or caregivers) and documentation in the medical record.

## 2024-01-27 ENCOUNTER — Inpatient Hospital Stay: Payer: Medicare Other | Attending: Hematology and Oncology | Admitting: Hematology and Oncology

## 2024-01-27 ENCOUNTER — Inpatient Hospital Stay: Payer: Medicare Other

## 2024-01-27 VITALS — BP 117/57 | HR 82 | Temp 97.3°F | Resp 16 | Wt 190.1 lb

## 2024-01-27 DIAGNOSIS — D649 Anemia, unspecified: Secondary | ICD-10-CM | POA: Insufficient documentation

## 2024-01-27 DIAGNOSIS — C9000 Multiple myeloma not having achieved remission: Secondary | ICD-10-CM | POA: Diagnosis not present

## 2024-01-27 DIAGNOSIS — C9001 Multiple myeloma in remission: Secondary | ICD-10-CM | POA: Diagnosis not present

## 2024-01-27 DIAGNOSIS — Z79624 Long term (current) use of inhibitors of nucleotide synthesis: Secondary | ICD-10-CM | POA: Insufficient documentation

## 2024-01-27 DIAGNOSIS — Z79899 Other long term (current) drug therapy: Secondary | ICD-10-CM | POA: Diagnosis not present

## 2024-01-27 LAB — CBC WITH DIFFERENTIAL/PLATELET
Abs Immature Granulocytes: 0.01 10*3/uL (ref 0.00–0.07)
Basophils Absolute: 0 10*3/uL (ref 0.0–0.1)
Basophils Relative: 1 %
Eosinophils Absolute: 0.1 10*3/uL (ref 0.0–0.5)
Eosinophils Relative: 2 %
HCT: 36.9 % — ABNORMAL LOW (ref 39.0–52.0)
Hemoglobin: 11.4 g/dL — ABNORMAL LOW (ref 13.0–17.0)
Immature Granulocytes: 0 %
Lymphocytes Relative: 21 %
Lymphs Abs: 1 10*3/uL (ref 0.7–4.0)
MCH: 25.8 pg — ABNORMAL LOW (ref 26.0–34.0)
MCHC: 30.9 g/dL (ref 30.0–36.0)
MCV: 83.5 fL (ref 80.0–100.0)
Monocytes Absolute: 0.5 10*3/uL (ref 0.1–1.0)
Monocytes Relative: 10 %
Neutro Abs: 3.2 10*3/uL (ref 1.7–7.7)
Neutrophils Relative %: 66 %
Platelets: 157 10*3/uL (ref 150–400)
RBC: 4.42 MIL/uL (ref 4.22–5.81)
RDW: 12.7 % (ref 11.5–15.5)
WBC: 4.9 10*3/uL (ref 4.0–10.5)
nRBC: 0 % (ref 0.0–0.2)

## 2024-01-27 LAB — CMP (CANCER CENTER ONLY)
ALT: 11 U/L (ref 0–44)
AST: 18 U/L (ref 15–41)
Albumin: 4.1 g/dL (ref 3.5–5.0)
Alkaline Phosphatase: 56 U/L (ref 38–126)
Anion gap: 3 — ABNORMAL LOW (ref 5–15)
BUN: 22 mg/dL (ref 8–23)
CO2: 32 mmol/L (ref 22–32)
Calcium: 9.1 mg/dL (ref 8.9–10.3)
Chloride: 103 mmol/L (ref 98–111)
Creatinine: 1.27 mg/dL — ABNORMAL HIGH (ref 0.61–1.24)
GFR, Estimated: 53 mL/min — ABNORMAL LOW (ref >60)
Glucose, Bld: 91 mg/dL (ref 70–99)
Potassium: 4.3 mmol/L (ref 3.5–5.1)
Sodium: 138 mmol/L (ref 135–145)
Total Bilirubin: 0.5 mg/dL (ref 0.0–1.2)
Total Protein: 7.2 g/dL (ref 6.5–8.1)

## 2024-01-28 LAB — KAPPA/LAMBDA LIGHT CHAINS
Kappa free light chain: 25.9 mg/L — ABNORMAL HIGH (ref 3.3–19.4)
Kappa, lambda light chain ratio: 2.23 — ABNORMAL HIGH (ref 0.26–1.65)
Lambda free light chains: 11.6 mg/L (ref 5.7–26.3)

## 2024-01-28 LAB — IGG, IGA, IGM
IgA: 242 mg/dL (ref 61–437)
IgG (Immunoglobin G), Serum: 1405 mg/dL (ref 603–1613)
IgM (Immunoglobulin M), Srm: 60 mg/dL (ref 15–143)

## 2024-01-31 LAB — PROTEIN ELECTROPHORESIS, SERUM, WITH REFLEX
A/G Ratio: 1.2 (ref 0.7–1.7)
Albumin ELP: 3.6 g/dL (ref 2.9–4.4)
Alpha-1-Globulin: 0.2 g/dL (ref 0.0–0.4)
Alpha-2-Globulin: 0.7 g/dL (ref 0.4–1.0)
Beta Globulin: 0.9 g/dL (ref 0.7–1.3)
Gamma Globulin: 1.3 g/dL (ref 0.4–1.8)
Globulin, Total: 3.1 g/dL (ref 2.2–3.9)
Total Protein ELP: 6.7 g/dL (ref 6.0–8.5)

## 2024-02-03 ENCOUNTER — Encounter (HOSPITAL_COMMUNITY): Payer: Self-pay

## 2024-02-03 ENCOUNTER — Other Ambulatory Visit: Payer: Self-pay

## 2024-02-03 ENCOUNTER — Emergency Department (HOSPITAL_COMMUNITY)
Admission: EM | Admit: 2024-02-03 | Discharge: 2024-02-03 | Disposition: A | Discharge status: Home / Self Care | Attending: Emergency Medicine | Admitting: Emergency Medicine

## 2024-02-03 ENCOUNTER — Emergency Department (HOSPITAL_COMMUNITY)

## 2024-02-03 DIAGNOSIS — R9389 Abnormal findings on diagnostic imaging of other specified body structures: Secondary | ICD-10-CM | POA: Diagnosis not present

## 2024-02-03 DIAGNOSIS — R0789 Other chest pain: Secondary | ICD-10-CM | POA: Diagnosis not present

## 2024-02-03 DIAGNOSIS — Z950 Presence of cardiac pacemaker: Secondary | ICD-10-CM | POA: Diagnosis not present

## 2024-02-03 DIAGNOSIS — R918 Other nonspecific abnormal finding of lung field: Secondary | ICD-10-CM | POA: Diagnosis not present

## 2024-02-03 DIAGNOSIS — S29002D Unspecified injury of muscle and tendon of back wall of thorax, subsequent encounter: Secondary | ICD-10-CM | POA: Diagnosis present

## 2024-02-03 DIAGNOSIS — S22089S Unspecified fracture of T11-T12 vertebra, sequela: Secondary | ICD-10-CM | POA: Diagnosis not present

## 2024-02-03 DIAGNOSIS — X58XXXD Exposure to other specified factors, subsequent encounter: Secondary | ICD-10-CM | POA: Diagnosis not present

## 2024-02-03 DIAGNOSIS — N189 Chronic kidney disease, unspecified: Secondary | ICD-10-CM | POA: Diagnosis not present

## 2024-02-03 DIAGNOSIS — S22080A Wedge compression fracture of T11-T12 vertebra, initial encounter for closed fracture: Secondary | ICD-10-CM | POA: Diagnosis not present

## 2024-02-03 DIAGNOSIS — E871 Hypo-osmolality and hyponatremia: Secondary | ICD-10-CM | POA: Diagnosis not present

## 2024-02-03 DIAGNOSIS — S22080S Wedge compression fracture of T11-T12 vertebra, sequela: Secondary | ICD-10-CM

## 2024-02-03 DIAGNOSIS — R079 Chest pain, unspecified: Secondary | ICD-10-CM | POA: Diagnosis not present

## 2024-02-03 LAB — CBC WITH DIFFERENTIAL/PLATELET
Abs Immature Granulocytes: 0.02 10*3/uL (ref 0.00–0.07)
Basophils Absolute: 0 10*3/uL (ref 0.0–0.1)
Basophils Relative: 0 %
Eosinophils Absolute: 0.2 10*3/uL (ref 0.0–0.5)
Eosinophils Relative: 2 %
HCT: 37.5 % — ABNORMAL LOW (ref 39.0–52.0)
Hemoglobin: 11.8 g/dL — ABNORMAL LOW (ref 13.0–17.0)
Immature Granulocytes: 0 %
Lymphocytes Relative: 15 %
Lymphs Abs: 1 10*3/uL (ref 0.7–4.0)
MCH: 26.2 pg (ref 26.0–34.0)
MCHC: 31.5 g/dL (ref 30.0–36.0)
MCV: 83.3 fL (ref 80.0–100.0)
Monocytes Absolute: 0.5 10*3/uL (ref 0.1–1.0)
Monocytes Relative: 8 %
Neutro Abs: 5.2 10*3/uL (ref 1.7–7.7)
Neutrophils Relative %: 75 %
Platelets: 161 10*3/uL (ref 150–400)
RBC: 4.5 MIL/uL (ref 4.22–5.81)
RDW: 12.6 % (ref 11.5–15.5)
WBC: 7 10*3/uL (ref 4.0–10.5)
nRBC: 0 % (ref 0.0–0.2)

## 2024-02-03 LAB — COMPREHENSIVE METABOLIC PANEL
ALT: 10 U/L (ref 0–44)
AST: 20 U/L (ref 15–41)
Albumin: 3.2 g/dL — ABNORMAL LOW (ref 3.5–5.0)
Alkaline Phosphatase: 46 U/L (ref 38–126)
Anion gap: 11 (ref 5–15)
BUN: 23 mg/dL (ref 8–23)
CO2: 23 mmol/L (ref 22–32)
Calcium: 8.6 mg/dL — ABNORMAL LOW (ref 8.9–10.3)
Chloride: 97 mmol/L — ABNORMAL LOW (ref 98–111)
Creatinine, Ser: 1.16 mg/dL (ref 0.61–1.24)
GFR, Estimated: 59 mL/min — ABNORMAL LOW (ref >60)
Glucose, Bld: 103 mg/dL — ABNORMAL HIGH (ref 70–99)
Potassium: 3.5 mmol/L (ref 3.5–5.1)
Sodium: 131 mmol/L — ABNORMAL LOW (ref 135–145)
Total Bilirubin: 0.8 mg/dL (ref 0.0–1.2)
Total Protein: 6.6 g/dL (ref 6.5–8.1)

## 2024-02-03 LAB — TROPONIN I (HIGH SENSITIVITY)
Troponin I (High Sensitivity): 9 ng/L (ref <18)
Troponin I (High Sensitivity): 5 ng/L (ref <18)

## 2024-02-03 LAB — LIPASE, BLOOD: Lipase: 23 U/L (ref 11–51)

## 2024-02-03 MED ORDER — FAMOTIDINE 20 MG PO TABS: 20.0000 mg | ORAL_TABLET | Freq: Two times a day (BID) | ORAL | 0 refills | Status: DC

## 2024-02-03 MED ORDER — SODIUM CHLORIDE 0.9 % IV BOLUS
1000.0000 mL | Freq: Once | INTRAVENOUS | Status: AC
  Administered 2024-02-03: 1000 mL via INTRAVENOUS

## 2024-02-03 NOTE — Discharge Instructions (Addendum)
 It is very important you follow-up with a cardiologist for your chest pain.  A single negative or normal workup in the ER does not rule out all potential serious causes of chest pain.  If your chest pain returns, worsens, including with lightheadedness, difficulty breathing, sweating, please call 911 and return to the ER.  In the meantime I started you on Pepcid which is a medicine for possible acid reflux gastritis.  Take this twice a day, 30 minutes before breakfast and dinner.  *  Your workup did have some incidental findings.  Your sodium or salt level was mildly low today and you were given normal saline fluids in the ER.  Your x-ray of the chest also showed that you have a compression fracture of your T12 thoracic spine.  This is likely a chronic issue.  You can follow-up with your doctor for both of these issues.

## 2024-02-03 NOTE — ED Provider Notes (Signed)
 [place] EMERGENCY DEPARTMENT AT [facility] Provider Note   CSN: [id number] Arrival date & time: 02/03/24  6962     History  Chief Complaint  Patient presents with   Chest Pain    Joe [name] is a 89 y.o. male w/ hx of SSS s/p pacemaker, multiple myeloma, CKD, here with chest pain.  Reports 3 days of substernal chest pain every morning while waking up, reporting that his symptoms get better and go away during the daytime.  He denies that there is postprandial pain.  He denies having this feeling before.  He does have a history of mild reflux but not persistent issues.  Denies known history of coronary disease.  He has got a pacemaker for sick sinus syndrome.  He is currently asymptomatic.  324 aspirin given by EMS  HPI     Home Medications Prior to Admission medications   Medication Sig Start Date End Date Taking? Authorizing Provider  famotidine (PEPCID) 20 MG tablet Take 1 tablet (20 mg total) by mouth 2 (two) times daily. Take 30 minutes before breakfast and dinner 02/03/24 03/04/24 Yes Arnez Stoneking, Kermit Balo, MD  acyclovir (ZOVIRAX) 400 MG tablet Take 400 mg by mouth daily.    [provider]  allopurinol (ZYLOPRIM) 100 MG tablet Take 100 mg by mouth daily. 05/01/21   [provider]  amLODipine (NORVASC) 5 MG tablet Take 5 mg by mouth daily. 01/08/20   [provider]  calcium carbonate (TUMS - DOSED IN MG ELEMENTAL CALCIUM) 500 MG chewable tablet Chew 3 tablets by mouth daily.    [provider]  doxazosin (CARDURA) 4 MG tablet Take 4 mg by mouth at bedtime.    [provider]  famotidine (PEPCID) 20 MG tablet Take 1 tablet by mouth once daily 01/17/24   Rachel Moulds, MD  finasteride (PROSCAR) 5 MG tablet Take 1 tablet by mouth once daily 01/24/24   Rachel Moulds, MD  fosinopril (MONOPRIL) 40 MG tablet Take 40 mg by mouth daily. 10/06/22   [provider]  gabapentin (NEURONTIN) 100 MG capsule TAKE 1 CAPSULE BY MOUTH  THREE TIMES DAILY 01/12/24   Rachel Moulds, MD  indapamide (LOZOL) 2.5 MG tablet Take 2.5 mg by mouth daily.     [provider]  MAGNESIUM-OXIDE 400 (240 Mg) MG tablet TAKE 1 TABLET BY MOUTH TWICE DAILY FOR 2 WEEKS THEN REDUCE BACK TO 1 TABLET DAILY Patient taking differently: Take 400 mg by mouth See admin instructions. TAKE 1 TABLET BY MOUTH TWICE DAILY FOR 2 WEEKS THEN REDUCE BACK TO 1 TABLET DAILY 01/20/24   Duke Salvia, MD  potassium chloride (KLOR-CON) 10 MEQ tablet Take 2 tablets (20 mEq total) by mouth daily. 11/29/23   Rachel Moulds, MD  pravastatin (PRAVACHOL) 40 MG tablet Take 40 mg by mouth daily. 01/08/20   [provider]  prednisoLONE acetate (PRED FORTE) 1 % ophthalmic suspension Place 1 drop into the left eye 2 (two) times daily. 12/08/22   [provider]  tamsulosin (FLOMAX) 0.4 MG CAPS capsule TAKE 1 CAPSULE BY MOUTH ONCE DAILY AFTER SUPPER Patient taking differently: Take 0.4 mg by mouth daily after supper. 12/07/23   Rachel Moulds, MD      Allergies    Patient has no known allergies.    Review of Systems   Review of Systems  Physical Exam Updated Vital Signs BP (!) 141/75 (BP Location: Right Arm)   Pulse 61   Temp 97.8 F (36.6 C)  Resp 16   Ht 6' (1.829 m)   Wt 86.2 kg   SpO2 98%   BMI 25.77 kg/m  Physical Exam  ED Results / Procedures / Treatments   Labs (all labs ordered are listed, but only abnormal results are displayed) Labs Reviewed  COMPREHENSIVE METABOLIC PANEL - Abnormal; Notable for the following components:      Result Value   Sodium 131 (*)    Chloride 97 (*)    Glucose, Bld 103 (*)    Calcium 8.6 (*)    Albumin 3.2 (*)    GFR, Estimated 59 (*)    All other components within normal limits  CBC WITH DIFFERENTIAL/PLATELET - Abnormal; Notable for the following components:   Hemoglobin 11.8 (*)    HCT 37.5 (*)    All other components within normal limits  LIPASE, BLOOD  TROPONIN I (HIGH SENSITIVITY)   TROPONIN I (HIGH SENSITIVITY)    EKG EKG Interpretation Date/Time:  Thursday February 03 2024 08:43:49 EST Ventricular Rate:  63 PR Interval:  182 QRS Duration:  189 QT Interval:  443 QTC Calculation: 454 R Axis:   -50  Text Interpretation: V paced rhytrhm Atrial premature complex Left bundle branch block Confirmed by Alvester Chou [phone]) on 02/03/2024 9:12:47 AM  Radiology DG Chest 2 View Result Date: 02/03/2024 CLINICAL DATA:  Chest pain EXAM: CHEST - 2 VIEW COMPARISON:  X-ray 11/14/2022 FINDINGS: Left upper chest pacemaker leads along the right side of the heart. Stable cardiac silhouette. Calcified aorta. Elevated left hemidiaphragm with some adjacent atelectasis and scar. No pneumothorax, edema or effusion. Film is under penetrated. Degenerative changes of the spine. There is severe compression of a vertebral body at the thoracolumbar junction, unchanged from prior. IMPRESSION: Pacemaker. Elevated left hemidiaphragm with adjacent atelectasis or scar. Severe compression deformity along the thoracolumbar spine, unchanged from previous. This is felt to be T12. Electronically Signed   By: Karen Kays M.D.   On: 02/03/2024 10:17    Procedures Procedures    Medications Ordered in ED Medications  sodium chloride 0.9 % bolus 1,000 mL (0 mLs Intravenous Stopped 02/03/24 1246)    ED Course/ Medical Decision Making/ A&P Clinical Course as of 02/03/24 1247  Thu Feb 03, 2024  1241 Patient reassessed and remaining asymptomatic.  I think this is most likely reflux related per his history and I will put him on Pepcid short-term.  However, given his age and comorbidities, I do think follow-up with his cardiologist would be reasonable, and encouraged him with his children at the bedside to contact the cardiology office for an appointment.  Strict return precautions were discussed.  But he is stable for discharge at this time [MT]    Clinical Course User Index [MT] Bishoy Cupp, Kermit Balo, MD                                  Medical Decision Making Amount and/or Complexity of Data Reviewed Labs: ordered. Radiology: ordered.   This patient presents to the Emergency Department with complaint of chest pain. This involves an extensive number of treatment options, and is a complaint that carries with it a high risk of complications and morbidity, given the patient's comorbidity, including high blood pressure, advanced age.The differential diagnosis includes ACS vs Pneumothorax vs Reflux/Gastritis vs MSK pain vs Pneumonia vs other.  I felt PE was less likely given that no tachycardia, no hypoxia, no acute risk factors  I  ordered, reviewed, and interpreted labs.  Pertinent results include delta troponins negative.  No emergent findings Patient was not having any active chest pain requiring medication I ordered imaging studies which included x-ray of the chest I independently visualized and interpreted imaging which showed atelectasis, elevated hemidiaphragm, chronic T12 compression fx, and the monitor tracing which showed V paced rhythm . I agree with the radiologist interpretation External records obtained and reviewed showing cardiology eval I personally reviewed the patients ECG which showed ventricular paced rhythm with no acute ischemia   Clinically I suspect the patient symptoms most consistent reflux gastritis, specifically if they are worse overnight and early in the morning get better during the daytime.  This to be quite atypical for ACS.  However given his advanced age I do think a follow-up with his cardiologist would also be reasonable.  In the meantime we will put him on Pepcid twice daily.  Strict return precautions were discussed.  The patient was comfortable going home, as well as his 2 children at the bedside.    He had mild hyponatremia which was treated with a liter of normal saline.  We did discuss his T12 compression fraction which she appears unaware of.  I was unable to  elicit any tenderness on spinal exam or with Spurling maneuver or axial loading, and I suspect this is likely a chronic fracture, unrelated to his current symptoms.  Difficult of course to exclude thoracic radiculopathy.  With this fracture being chronic and not causing any significant distress, and I suspect stable, he can follow-up as an outpatient with his doctor for it.  Based on the patient's clinical exam, vital signs, risk factors, and ED testing, I felt that the patient's overall risk of life-threatening emergency such as ACS, PE, sepsis, or infection was low.          Final Clinical Impression(s) / ED Diagnoses Final diagnoses:  Chest pain, unspecified type  Compression fracture of T12 vertebra, sequela  Hyponatremia    Rx / DC Orders ED Discharge Orders          Ordered    famotidine (PEPCID) 20 MG tablet  2 times daily        02/03/24 1243              Terald Sleeper, MD 02/03/24 1247

## 2024-02-03 NOTE — ED Triage Notes (Signed)
 Pt BIB[facility]EMS from home with chest pain that started 2-3 days ago. Every morning when he wakes up centralized. Pacemaker looked good on the 22 with the cancer provider. 324 ASA  150/78 70 hr paced 113 cbg  18 g right wrist

## 2024-02-10 ENCOUNTER — Other Ambulatory Visit: Payer: Self-pay | Admitting: Hematology and Oncology

## 2024-02-14 ENCOUNTER — Ambulatory Visit: Payer: Medicare Other

## 2024-02-14 DIAGNOSIS — I442 Atrioventricular block, complete: Secondary | ICD-10-CM

## 2024-02-15 LAB — CUP PACEART REMOTE DEVICE CHECK
Battery Remaining Longevity: 87 mo
Battery Remaining Percentage: 87 %
Battery Voltage: 3.01 V
Brady Statistic AP VP Percent: 68 %
Brady Statistic AP VS Percent: 1 %
Brady Statistic AS VP Percent: 32 %
Brady Statistic AS VS Percent: 1 %
Brady Statistic RA Percent Paced: 68 %
Brady Statistic RV Percent Paced: 99 %
Date Time Interrogation Session: 20250317020018
Implantable Lead Connection Status: 753985
Implantable Lead Connection Status: 753985
Implantable Lead Implant Date: 20231215
Implantable Lead Implant Date: 20231215
Implantable Lead Location: 753859
Implantable Lead Location: 753860
Implantable Lead Model: 1944
Implantable Lead Model: 1948
Implantable Pulse Generator Implant Date: 20231215
Lead Channel Impedance Value: 490 Ohm
Lead Channel Impedance Value: 550 Ohm
Lead Channel Pacing Threshold Amplitude: 0.5 V
Lead Channel Pacing Threshold Amplitude: 0.5 V
Lead Channel Pacing Threshold Pulse Width: 0.5 ms
Lead Channel Pacing Threshold Pulse Width: 0.5 ms
Lead Channel Sensing Intrinsic Amplitude: 12 mV
Lead Channel Sensing Intrinsic Amplitude: 5 mV
Lead Channel Setting Pacing Amplitude: 2.5 V
Lead Channel Setting Pacing Amplitude: 2.5 V
Lead Channel Setting Pacing Pulse Width: 0.5 ms
Lead Channel Setting Sensing Sensitivity: 4 mV
Pulse Gen Model: 2272
Pulse Gen Serial Number: 8129463

## 2024-02-21 ENCOUNTER — Ambulatory Visit: Attending: Internal Medicine | Admitting: Internal Medicine

## 2024-02-21 ENCOUNTER — Encounter: Payer: Self-pay | Admitting: Internal Medicine

## 2024-02-21 VITALS — BP 128/66 | HR 69 | Resp 16 | Ht 72.0 in | Wt 187.0 lb

## 2024-02-21 DIAGNOSIS — I442 Atrioventricular block, complete: Secondary | ICD-10-CM

## 2024-02-21 DIAGNOSIS — Z950 Presence of cardiac pacemaker: Secondary | ICD-10-CM

## 2024-02-21 NOTE — Progress Notes (Unsigned)
 If Mr. Jettie Booze       Patient Care Team: Georgianne Fick, MD as PCP - General (Internal Medicine) Duke Salvia, MD as PCP - Electrophysiology (Cardiology) Neal Dy, MD as Consulting Physician (Hematology and Oncology)   HPI  Joe [name] is a 89 y.o. male seen in follow-up for pacemaker-[employer] implanted 12/23 for high-grade heart block symptomatic identified at the cancer center where he was followed for multiple myeloma The patient denies chest pain, shortness of breath, nocturnal dyspnea, orthopnea or peripheral edema.  There have been no palpitations, lightheadedness or syncope.   Taking care of his wife with dementia   DATE TEST EF   12/23 Echo   60-65 %              Date Cr K Hgb  2/24 1.21 4.3 9.0            Records and Results Reviewed    Past Medical History:  Diagnosis Date   Enlarged prostate    Hypertension    Melanoma (HCC) 2021   Multiple myeloma (HCC)     Past Surgical History:  Procedure Laterality Date   EYE SURGERY     PACEMAKER IMPLANT N/A 11/13/2022   Procedure: PACEMAKER IMPLANT;  Surgeon: Duke Salvia, MD;  Location: [facility] INVASIVE CV LAB;  Service: Cardiovascular;  Laterality: N/A;   PROSTATE SURGERY      Current Meds  Medication Sig   amLODipine (NORVASC) 5 MG tablet Take 5 mg by mouth daily.   calcium carbonate (TUMS - DOSED IN MG ELEMENTAL CALCIUM) 500 MG chewable tablet Chew 3 tablets by mouth daily.   doxazosin (CARDURA) 4 MG tablet Take 4 mg by mouth at bedtime.   famotidine (PEPCID) 20 MG tablet Take 1 tablet by mouth once daily   finasteride (PROSCAR) 5 MG tablet Take 1 tablet by mouth once daily   fosinopril (MONOPRIL) 40 MG tablet Take 40 mg by mouth daily.   gabapentin (NEURONTIN) 100 MG capsule TAKE 1 CAPSULE BY MOUTH THREE TIMES DAILY   indapamide (LOZOL) 2.5 MG tablet Take 2.5 mg by mouth daily.    MAGNESIUM-OXIDE 400 (240 Mg) MG tablet TAKE 1 TABLET BY MOUTH TWICE DAILY FOR 2 WEEKS  THEN REDUCE BACK TO 1 TABLET DAILY (Patient taking differently: Take 400 mg by mouth See admin instructions. TAKE 1 TABLET BY MOUTH TWICE DAILY FOR 2 WEEKS THEN REDUCE BACK TO 1 TABLET DAILY)   potassium chloride (KLOR-CON) 10 MEQ tablet Take 2 tablets (20 mEq total) by mouth daily.   pravastatin (PRAVACHOL) 40 MG tablet Take 40 mg by mouth daily.   prednisoLONE acetate (PRED FORTE) 1 % ophthalmic suspension Place 1 drop into the left eye 2 (two) times daily.   tamsulosin (FLOMAX) 0.4 MG CAPS capsule TAKE 1 CAPSULE BY MOUTH ONCE DAILY AFTER SUPPER (Patient taking differently: Take 0.4 mg by mouth daily after supper.)    No Known Allergies    Review of Systems negative except from HPI and PMH  Physical Exam BP 128/66 (BP Location: Left Arm, Patient Position: Sitting, Cuff Size: Normal)   Pulse 69   Resp 16   Ht 6' (1.829 m)   Wt 187 lb (84.8 kg)   BMI 25.36 kg/m  Well developed and well nourished in no acute distress HENT normal Neck supple with JVP-flat Clear Device pocket well healed; without hematoma or erythema.  There is no tethering  Regular rate and rhythm, no  gallop No  murmur Abd-soft with  active BS No Clubbing cyanosis  edema Skin-warm and dry A & Oriented  Grossly normal sensory and motor function  ECG sinus with P synchronous pacing at 69 Levels 20/18/44  Device function is normal. Programming changes none    Estimated Creatinine Clearance: 44.6 mL/min (by C-G formula based on SCr of 1.16 mg/dL).   Assessment and  Plan Complete heart block, relatively narrow QRS withsome competitive wide complex beats   Myeloma   HTN   CKD 3b Estimated Creatinine Clearance: 41.8 mL/min (A) (by C-G formula based on SCr of 1.25 mg/dL (H)).   LV function normal    Much improved   Blood pressure is well-controlled.    Current medicines are reviewed at length with the patient today .  The patient does not have concerns regarding medicines. There

## 2024-02-21 NOTE — Patient Instructions (Signed)

## 2024-02-24 ENCOUNTER — Other Ambulatory Visit: Payer: Self-pay | Admitting: Hematology and Oncology

## 2024-02-24 DIAGNOSIS — D472 Monoclonal gammopathy: Secondary | ICD-10-CM

## 2024-02-29 LAB — CUP PACEART INCLINIC DEVICE CHECK
Battery Remaining Longevity: 86 mo
Battery Voltage: 3.01 V
Brady Statistic RA Percent Paced: 68 %
Brady Statistic RV Percent Paced: 99.88 %
Date Time Interrogation Session: 20250324154500
Implantable Lead Connection Status: 753985
Implantable Lead Connection Status: 753985
Implantable Lead Implant Date: 20231215
Implantable Lead Implant Date: 20231215
Implantable Lead Location: 753859
Implantable Lead Location: 753860
Implantable Lead Model: 1944
Implantable Lead Model: 1948
Implantable Pulse Generator Implant Date: 20231215
Lead Channel Impedance Value: 550 Ohm
Lead Channel Impedance Value: 587.5 Ohm
Lead Channel Pacing Threshold Amplitude: 0.5 V
Lead Channel Pacing Threshold Amplitude: 0.5 V
Lead Channel Pacing Threshold Amplitude: 0.5 V
Lead Channel Pacing Threshold Amplitude: 0.5 V
Lead Channel Pacing Threshold Pulse Width: 0.5 ms
Lead Channel Pacing Threshold Pulse Width: 0.5 ms
Lead Channel Pacing Threshold Pulse Width: 0.5 ms
Lead Channel Pacing Threshold Pulse Width: 0.5 ms
Lead Channel Sensing Intrinsic Amplitude: 12 mV
Lead Channel Sensing Intrinsic Amplitude: 2.3 mV
Lead Channel Setting Pacing Amplitude: 2.5 V
Lead Channel Setting Pacing Amplitude: 2.5 V
Lead Channel Setting Pacing Pulse Width: 0.5 ms
Lead Channel Setting Sensing Sensitivity: 4 mV
Pulse Gen Model: 2272
Pulse Gen Serial Number: 8129463

## 2024-03-02 ENCOUNTER — Inpatient Hospital Stay: Payer: Medicare Other

## 2024-03-02 ENCOUNTER — Inpatient Hospital Stay: Payer: Medicare Other | Attending: Hematology and Oncology

## 2024-03-02 ENCOUNTER — Other Ambulatory Visit: Payer: Self-pay

## 2024-03-02 DIAGNOSIS — C9001 Multiple myeloma in remission: Secondary | ICD-10-CM | POA: Diagnosis not present

## 2024-03-02 DIAGNOSIS — Z79899 Other long term (current) drug therapy: Secondary | ICD-10-CM | POA: Diagnosis not present

## 2024-03-02 DIAGNOSIS — Z7189 Other specified counseling: Secondary | ICD-10-CM

## 2024-03-02 DIAGNOSIS — Z79624 Long term (current) use of inhibitors of nucleotide synthesis: Secondary | ICD-10-CM | POA: Diagnosis not present

## 2024-03-02 DIAGNOSIS — C9000 Multiple myeloma not having achieved remission: Secondary | ICD-10-CM

## 2024-03-02 DIAGNOSIS — D649 Anemia, unspecified: Secondary | ICD-10-CM | POA: Insufficient documentation

## 2024-03-02 LAB — CBC WITH DIFFERENTIAL/PLATELET
Abs Immature Granulocytes: 0.03 10*3/uL (ref 0.00–0.07)
Basophils Absolute: 0 10*3/uL (ref 0.0–0.1)
Basophils Relative: 1 %
Eosinophils Absolute: 0.3 10*3/uL (ref 0.0–0.5)
Eosinophils Relative: 5 %
HCT: 37.9 % — ABNORMAL LOW (ref 39.0–52.0)
Hemoglobin: 11.8 g/dL — ABNORMAL LOW (ref 13.0–17.0)
Immature Granulocytes: 1 %
Lymphocytes Relative: 26 %
Lymphs Abs: 1.4 10*3/uL (ref 0.7–4.0)
MCH: 25.8 pg — ABNORMAL LOW (ref 26.0–34.0)
MCHC: 31.1 g/dL (ref 30.0–36.0)
MCV: 82.8 fL (ref 80.0–100.0)
Monocytes Absolute: 0.5 10*3/uL (ref 0.1–1.0)
Monocytes Relative: 10 %
Neutro Abs: 3.1 10*3/uL (ref 1.7–7.7)
Neutrophils Relative %: 57 %
Platelets: 135 10*3/uL — ABNORMAL LOW (ref 150–400)
RBC: 4.58 MIL/uL (ref 4.22–5.81)
RDW: 12.2 % (ref 11.5–15.5)
WBC: 5.3 10*3/uL (ref 4.0–10.5)
nRBC: 0 % (ref 0.0–0.2)

## 2024-03-02 MED ORDER — DENOSUMAB 120 MG/1.7ML ~~LOC~~ SOLN
120.0000 mg | Freq: Once | SUBCUTANEOUS | Status: AC
  Administered 2024-03-02: 120 mg via SUBCUTANEOUS
  Filled 2024-03-02: qty 1.7

## 2024-03-02 NOTE — Progress Notes (Signed)
 Per MD Iruku, OK to proceed with Joe [name] today 4/3 with CMP from 3/6 as this is still within 30 day parameter. Pt encouraged to take PO calcium.

## 2024-03-03 LAB — KAPPA/LAMBDA LIGHT CHAINS
Kappa free light chain: 24 mg/L — ABNORMAL HIGH (ref 3.3–19.4)
Kappa, lambda light chain ratio: 2.03 — ABNORMAL HIGH (ref 0.26–1.65)
Lambda free light chains: 11.8 mg/L (ref 5.7–26.3)

## 2024-03-07 ENCOUNTER — Other Ambulatory Visit: Payer: Self-pay | Admitting: Hematology and Oncology

## 2024-03-07 LAB — MULTIPLE MYELOMA PANEL, SERUM
Albumin SerPl Elph-Mcnc: 3.4 g/dL (ref 2.9–4.4)
Albumin/Glob SerPl: 1.2 (ref 0.7–1.7)
Alpha 1: 0.2 g/dL (ref 0.0–0.4)
Alpha2 Glob SerPl Elph-Mcnc: 0.7 g/dL (ref 0.4–1.0)
B-Globulin SerPl Elph-Mcnc: 0.9 g/dL (ref 0.7–1.3)
Gamma Glob SerPl Elph-Mcnc: 1.2 g/dL (ref 0.4–1.8)
Globulin, Total: 2.9 g/dL (ref 2.2–3.9)
IgA: 238 mg/dL (ref 61–437)
IgG (Immunoglobin G), Serum: 1337 mg/dL (ref 603–1613)
IgM (Immunoglobulin M), Srm: 56 mg/dL (ref 15–143)
Total Protein ELP: 6.3 g/dL (ref 6.0–8.5)

## 2024-03-09 ENCOUNTER — Other Ambulatory Visit: Payer: Self-pay | Admitting: Hematology and Oncology

## 2024-03-10 ENCOUNTER — Encounter: Payer: Self-pay | Admitting: Oncology

## 2024-03-10 NOTE — Telephone Encounter (Signed)
 Noted refill request for flomax received- with noted interaction with doxazosin.  Per chart review - prescription was done under "[facility]" previously with need for primary MD now established to take over.  This RN called to Dr Nicholos Johns and spoke with Harriett Sine- discussed above with understanding they will assume further refills.

## 2024-03-11 ENCOUNTER — Encounter: Payer: Self-pay | Admitting: Internal Medicine

## 2024-03-11 ENCOUNTER — Other Ambulatory Visit: Payer: Self-pay | Admitting: Hematology and Oncology

## 2024-03-13 ENCOUNTER — Encounter: Payer: Self-pay | Admitting: Oncology

## 2024-03-21 ENCOUNTER — Other Ambulatory Visit: Payer: Self-pay | Admitting: Hematology and Oncology

## 2024-03-22 ENCOUNTER — Encounter: Payer: Self-pay | Admitting: Hematology and Oncology

## 2024-03-22 ENCOUNTER — Encounter: Payer: Self-pay | Admitting: Oncology

## 2024-03-24 ENCOUNTER — Other Ambulatory Visit: Payer: Self-pay | Admitting: Hematology and Oncology

## 2024-03-24 DIAGNOSIS — D472 Monoclonal gammopathy: Secondary | ICD-10-CM

## 2024-03-27 ENCOUNTER — Ambulatory Visit: Payer: Medicare Other | Admitting: Hematology and Oncology

## 2024-03-27 ENCOUNTER — Other Ambulatory Visit: Payer: Self-pay | Admitting: Hematology and Oncology

## 2024-03-27 ENCOUNTER — Other Ambulatory Visit: Payer: Medicare Other

## 2024-03-27 DIAGNOSIS — D472 Monoclonal gammopathy: Secondary | ICD-10-CM

## 2024-03-29 ENCOUNTER — Encounter: Payer: Self-pay | Admitting: Hematology and Oncology

## 2024-03-30 ENCOUNTER — Telehealth: Payer: Self-pay

## 2024-03-30 NOTE — Progress Notes (Signed)
 Remote pacemaker transmission.

## 2024-03-30 NOTE — Addendum Note (Signed)
 Addended by: Edra Govern D on: 03/30/2024 12:01 PM   Modules accepted: Orders

## 2024-03-30 NOTE — Telephone Encounter (Signed)
 Spoke with patient and confirmed appointment on 03/31/24

## 2024-03-31 ENCOUNTER — Inpatient Hospital Stay: Attending: Hematology and Oncology | Admitting: Hematology and Oncology

## 2024-03-31 ENCOUNTER — Inpatient Hospital Stay

## 2024-03-31 VITALS — BP 131/56 | HR 70 | Temp 97.7°F | Resp 16 | Ht 72.0 in | Wt 188.9 lb

## 2024-03-31 DIAGNOSIS — C9001 Multiple myeloma in remission: Secondary | ICD-10-CM

## 2024-03-31 DIAGNOSIS — Z79899 Other long term (current) drug therapy: Secondary | ICD-10-CM | POA: Insufficient documentation

## 2024-03-31 DIAGNOSIS — D649 Anemia, unspecified: Secondary | ICD-10-CM | POA: Insufficient documentation

## 2024-03-31 LAB — CMP (CANCER CENTER ONLY)
ALT: 9 U/L (ref 0–44)
AST: 16 U/L (ref 15–41)
Albumin: 3.8 g/dL (ref 3.5–5.0)
Alkaline Phosphatase: 42 U/L (ref 38–126)
Anion gap: 3 — ABNORMAL LOW (ref 5–15)
BUN: 22 mg/dL (ref 8–23)
CO2: 31 mmol/L (ref 22–32)
Calcium: 8.9 mg/dL (ref 8.9–10.3)
Chloride: 105 mmol/L (ref 98–111)
Creatinine: 1.22 mg/dL (ref 0.61–1.24)
GFR, Estimated: 56 mL/min — ABNORMAL LOW (ref >60)
Glucose, Bld: 118 mg/dL — ABNORMAL HIGH (ref 70–99)
Potassium: 4.6 mmol/L (ref 3.5–5.1)
Sodium: 139 mmol/L (ref 135–145)
Total Bilirubin: 0.5 mg/dL (ref 0.0–1.2)
Total Protein: 6.6 g/dL (ref 6.5–8.1)

## 2024-03-31 LAB — CBC WITH DIFFERENTIAL/PLATELET
Abs Immature Granulocytes: 0.01 10*3/uL (ref 0.00–0.07)
Basophils Absolute: 0 10*3/uL (ref 0.0–0.1)
Basophils Relative: 0 %
Eosinophils Absolute: 0.1 10*3/uL (ref 0.0–0.5)
Eosinophils Relative: 3 %
HCT: 33.1 % — ABNORMAL LOW (ref 39.0–52.0)
Hemoglobin: 10.4 g/dL — ABNORMAL LOW (ref 13.0–17.0)
Immature Granulocytes: 0 %
Lymphocytes Relative: 24 %
Lymphs Abs: 1.1 10*3/uL (ref 0.7–4.0)
MCH: 25.4 pg — ABNORMAL LOW (ref 26.0–34.0)
MCHC: 31.4 g/dL (ref 30.0–36.0)
MCV: 80.7 fL (ref 80.0–100.0)
Monocytes Absolute: 0.5 10*3/uL (ref 0.1–1.0)
Monocytes Relative: 10 %
Neutro Abs: 2.9 10*3/uL (ref 1.7–7.7)
Neutrophils Relative %: 63 %
Platelets: 144 10*3/uL — ABNORMAL LOW (ref 150–400)
RBC: 4.1 MIL/uL — ABNORMAL LOW (ref 4.22–5.81)
RDW: 12.6 % (ref 11.5–15.5)
WBC: 4.6 10*3/uL (ref 4.0–10.5)
nRBC: 0 % (ref 0.0–0.2)

## 2024-03-31 NOTE — Progress Notes (Signed)
 [facility]  Telephone:([phone]) [phone] Fax:([phone]) [phone]     ID: Joe [name] DOB: Nov 27, 1929  MR#: [id number]  JYN#:[id number]  Patient Care Team: Virgle Grime, MD as PCP - General (Internal Medicine) Verona Goodwill, MD as PCP - Electrophysiology (Cardiology) Dorman Gaskin, Sharyon Deis, MD as Consulting Physician (Hematology and Oncology) Murleen Arms, MD  CHIEF COMPLAINT: multiple myeloma  CURRENT TREATMENT: denosumab  Xgeva  every 12 weeks;   INTERVAL HISTORY:  Discussed the use of AI scribe software for clinical note transcription with the patient, who gave verbal consent to proceed.  History of Present Illness    Discussed the use of AI scribe software for clinical note transcription with the patient, who gave verbal consent to proceed.  History of Present Illness Joe [name] is a 89 year old male with myeloma who presents for follow-up after an ER visit for chest pain.  Approximately one week ago, he visited the emergency room due to chest pain, which was suspected to be related to reflux. He was prescribed medication, which he took until the pain subsided, and then he discontinued it. He has since seen his cardiologist, who indicated that he is in good shape. No fevers, pain, or sweats.  He mentions that his head feels off and that he sneezes when exposed to wind outside. Despite these symptoms, he continues to try to remain active at home, engaging in activities such as chopping and working with flower beds.  His social history includes caring for his wife, who has dementia, which keeps him busy. He no longer mows the lawn but remains active in other household activities.    Rest of the pertinent 10 point ROS reviewed and neg.  Lab Results  Component Value Date   KAPLAMBRATIO 2.03 (H) 03/02/2024   KAPLAMBRATIO 2.23 (H) 01/27/2024   KAPLAMBRATIO 2.32 (H) 11/29/2023   KAPLAMBRATIO 2.25 (H) 09/30/2023   KAPLAMBRATIO 1.95 (H) 07/30/2023     REVIEW OF SYSTEMS:  Rest of the pertinent 10 point ROS reviewed and negative   COVID 19 VACCINATION STATUS: received Pfizer x2 with booster October 2021.  He received evusheld on 03/20/2021 and is due a second dose   HISTORY OF CURRENT ILLNESS:  From the original intake note:  Mr. Joe [name] is an 89 y/o [place] man formerly followed by my partner Dr Marguerita Shih for M-GUS. We have an M-Spike of 0.87 documented 07/12/2012. [name] kappa/lambda light chains and total IgG were follower yearly until 01/2016, when they were 2.36 and 1517 respectively (unremarkable). He was lost to follow-up after that point.  [02/29/2020] the patient presented to Urgent Care with poorly controlled pain. This was felt to be mussculoskeletal and he was started on a medrol  dose-pak and robaxin . However his pain worsened and he presented to the ED at [facility] where vitals and exam were unremarkable but labs showed a creatinine of 1.84 (baseline 1.0), calcium  12.7 with albumin 2.8 and total protein 10.4 (globulin fraction 7.8).  CT of the abdomen obtained for evaluation of his abdominal and back pain showed no hydronephrosis but multiple lytic lesons.   The patient's subsequent history is as detailed below.  PAST MEDICAL HISTORY: Past Medical History:  Diagnosis Date   Enlarged prostate    Hypertension    Melanoma (HCC) 2021   Multiple myeloma (HCC)     PAST SURGICAL HISTORY: Past Surgical History:  Procedure Laterality Date   EYE SURGERY     PACEMAKER IMPLANT N/A 11/13/2022   Procedure: PACEMAKER IMPLANT;  Surgeon: Rodolfo Clan,  Milon Aloe, MD;  Location: [facility] INVASIVE CV LAB;  Service: Cardiovascular;  Laterality: N/A;   PROSTATE SURGERY      FAMILY HISTORY Family History  Problem Relation Age of Onset   Hypertension Mother     SOCIAL HISTORY:  Retired, used to work for the [employer]. Lives with wife [place]. Has 4 children, 2 in [place], one in [place], one in [place]; 5  grandchildren and 5 great-grandchildren. Attends a [employer].    ADVANCED DIRECTIVES: at the initial visit the patient confirmed his wife is his HCPOA (despite her memory issues).   HEALTH MAINTENANCE: Social History   Tobacco Use   Smoking status: Never   Smokeless tobacco: Never  Vaping Use   Vaping status: Never Used  Substance Use Topics   Alcohol use: Never   Drug use: Never     Colonoscopy: 6-8 years ago     No Known Allergies  Current Outpatient Medications  Medication Sig Dispense Refill   amLODipine  (NORVASC ) 5 MG tablet Take 5 mg by mouth daily.     calcium  carbonate (TUMS - DOSED IN MG ELEMENTAL CALCIUM ) 500 MG chewable tablet Chew 3 tablets by mouth daily.     doxazosin  (CARDURA ) 4 MG tablet Take 4 mg by mouth at bedtime.     famotidine  (PEPCID ) 20 MG tablet Take 1 tablet by mouth once daily 90 tablet 0   finasteride  (PROSCAR ) 5 MG tablet Take 1 tablet by mouth once daily 30 tablet 0   fosinopril  (MONOPRIL ) 40 MG tablet Take 40 mg by mouth daily.     gabapentin  (NEURONTIN ) 100 MG capsule TAKE 1 CAPSULE BY MOUTH THREE TIMES DAILY 90 capsule 0   indapamide  (LOZOL ) 2.5 MG tablet Take 2.5 mg by mouth daily.      MAGNESIUM -OXIDE 400 (240 Mg) MG tablet TAKE 1 TABLET BY MOUTH TWICE DAILY FOR 2 WEEKS THEN REDUCE BACK TO 1 TABLET DAILY (Patient taking differently: Take 400 mg by mouth See admin instructions. TAKE 1 TABLET BY MOUTH TWICE DAILY FOR 2 WEEKS THEN REDUCE BACK TO 1 TABLET DAILY) 180 tablet 0   potassium chloride  (KLOR-CON ) 10 MEQ tablet Take 2 tablets (20 mEq total) by mouth daily. 180 tablet 0   pravastatin  (PRAVACHOL ) 40 MG tablet Take 40 mg by mouth daily.     prednisoLONE acetate (PRED FORTE) 1 % ophthalmic suspension Place 1 drop into the left eye 2 (two) times daily.     tamsulosin  (FLOMAX ) 0.4 MG CAPS capsule TAKE 1 CAPSULE BY MOUTH ONCE DAILY AFTER SUPPER (Patient taking differently: Take 0.4 mg by mouth daily after supper.) 90 capsule 0   No  current facility-administered medications for this visit.    OBJECTIVE: African-American man in no acute distress Vitals:   03/31/24 1229  BP: (!) 131/56  Pulse: 70  Resp: 16  Temp: 97.7 F (36.5 C)  SpO2: 98%          Body mass index is 25.62 kg/m.   Wt Readings from Last 3 Encounters:  03/31/24 188 lb 14.4 oz (85.7 kg)  02/21/24 187 lb (84.8 kg)  01/27/24 190 lb 1.6 oz (86.2 kg)  ECOG FS:1 - Symptomatic but completely ambulatory  Physical Exam Constitutional:      Appearance: Normal appearance.  HENT:     Head: Normocephalic and atraumatic.  Cardiovascular:     Rate and Rhythm: Normal rate and regular rhythm.  Abdominal:     General: Abdomen is flat.     Palpations: Abdomen  is soft.  Musculoskeletal:        General: No swelling or tenderness.     Cervical back: Normal range of motion and neck supple.  Neurological:     General: No focal deficit present.     Mental Status: He is alert.  Psychiatric:        Mood and Affect: Mood normal.      LAB RESULTS:  CMP     Component Value Date/Time   NA 131 (L) 02/03/2024 0850   NA 137 12/01/2022 1337   NA 140 01/09/2016 1045   K 3.5 02/03/2024 0850   K 3.8 01/09/2016 1045   CL 97 (L) 02/03/2024 0850   CO2 23 02/03/2024 0850   CO2 27 01/09/2016 1045   GLUCOSE 103 (H) 02/03/2024 0850   GLUCOSE 89 01/09/2016 1045   BUN 23 02/03/2024 0850   BUN 28 12/01/2022 1337   BUN 14.7 01/09/2016 1045   CREATININE 1.16 02/03/2024 0850   CREATININE 1.27 (H) 01/27/2024 1002   CREATININE 1.0 01/09/2016 1045   CALCIUM  8.6 (L) 02/03/2024 0850   CALCIUM  9.2 01/09/2016 1045   PROT 6.6 02/03/2024 0850   PROT 7.3 01/09/2016 1045   ALBUMIN 3.2 (L) 02/03/2024 0850   ALBUMIN 3.7 01/09/2016 1045   AST 20 02/03/2024 0850   AST 18 01/27/2024 1002   AST 18 01/09/2016 1045   ALT 10 02/03/2024 0850   ALT 11 01/27/2024 1002   ALT 15 01/09/2016 1045   ALKPHOS 46 02/03/2024 0850   ALKPHOS 60 01/09/2016 1045   BILITOT 0.8  02/03/2024 0850   BILITOT 0.5 01/27/2024 1002   BILITOT 0.51 01/09/2016 1045   GFRNONAA 59 (L) 02/03/2024 0850   GFRNONAA 53 (L) 01/27/2024 1002   GFRAA >60 08/30/2020 1402    Lab Results  Component Value Date   TOTALPROTELP 6.3 03/02/2024   ALBUMINELP 3.6 01/27/2024   A1GS 0.2 01/27/2024   A2GS 0.7 01/27/2024   BETS 0.9 01/27/2024   BETA2SER 3.6 07/12/2012   GAMS 1.3 01/27/2024   MSPIKE Not Observed 01/27/2024   SPEI * 07/12/2012     Lab Results  Component Value Date   KPAFRELGTCHN 24.0 (H) 03/02/2024   LAMBDASER 11.8 03/02/2024   KAPLAMBRATIO 2.03 (H) 03/02/2024    Lab Results  Component Value Date   WBC 5.3 03/02/2024   NEUTROABS 3.1 03/02/2024   HGB 11.8 (L) 03/02/2024   HCT 37.9 (L) 03/02/2024   MCV 82.8 03/02/2024   PLT 135 (L) 03/02/2024      Chemistry      Component Value Date/Time   NA 131 (L) 02/03/2024 0850   NA 137 12/01/2022 1337   NA 140 01/09/2016 1045   K 3.5 02/03/2024 0850   K 3.8 01/09/2016 1045   CL 97 (L) 02/03/2024 0850   CO2 23 02/03/2024 0850   CO2 27 01/09/2016 1045   BUN 23 02/03/2024 0850   BUN 28 12/01/2022 1337   BUN 14.7 01/09/2016 1045   CREATININE 1.16 02/03/2024 0850   CREATININE 1.27 (H) 01/27/2024 1002   CREATININE 1.0 01/09/2016 1045      Component Value Date/Time   CALCIUM  8.6 (L) 02/03/2024 0850   CALCIUM  9.2 01/09/2016 1045   ALKPHOS 46 02/03/2024 0850   ALKPHOS 60 01/09/2016 1045   AST 20 02/03/2024 0850   AST 18 01/27/2024 1002   AST 18 01/09/2016 1045   ALT 10 02/03/2024 0850   ALT 11 01/27/2024 1002   ALT 15 01/09/2016 1045  BILITOT 0.8 02/03/2024 0850   BILITOT 0.5 01/27/2024 1002   BILITOT 0.51 01/09/2016 1045       No results found for: "LABCA2"  No components found for: "ZOXWRU045"  No results for input(s): "INR" in the last 168 hours.  No results found for: "LABCA2"  No results found for: "WUJ811"  No results found for: "CAN125"  No results found for: "CAN153"  No results found  for: "CA2729"  No components found for: "HGQUANT"  No results found for: "CEA1", "CEA" / No results found for: "CEA1", "CEA"   No results found for: "AFPTUMOR"  No results found for: "CHROMOGRNA"  No results found for: "HGBA", "HGBA2QUANT", "HGBFQUANT", "HGBSQUAN" (Hemoglobinopathy evaluation)   Lab Results  Component Value Date   LDH 136 01/09/2016    Lab Results  Component Value Date   IRON 28 (L) 03/26/2022   TIBC 304 03/26/2022   IRONPCTSAT 9 (L) 03/26/2022   (Iron and TIBC)  Lab Results  Component Value Date   FERRITIN 142 05/25/2022    Urinalysis    Component Value Date/Time   COLORURINE AMBER (A) 03/01/2020 1052   APPEARANCEUR CLOUDY (A) 03/01/2020 1052   LABSPEC 1.021 03/01/2020 1052   PHURINE 5.0 03/01/2020 1052   GLUCOSEU NEGATIVE 03/01/2020 1052   HGBUR NEGATIVE 03/01/2020 1052   BILIRUBINUR NEGATIVE 03/01/2020 1052   KETONESUR NEGATIVE 03/01/2020 1052   PROTEINUR 100 (A) 03/01/2020 1052   UROBILINOGEN 0.2 02/29/2020 1537   NITRITE NEGATIVE 03/01/2020 1052   LEUKOCYTESUR NEGATIVE 03/01/2020 1052    STUDIES: No results found.    ELIGIBLE FOR AVAILABLE RESEARCH PROTOCOL:   ASSESSMENT: 89 y.o.  69 [place] man with a history of M-GUS, presenting 02/29/2020 with a high globulin fraction, worsening renal function, hypercalcemia, anemia and multiple lytic bone lesions.    (1) IgG Kappa Multiple Myeloma: labs diagnostic on 03/01/2020 with Mspike of 3.8, Kappa free light chain 690.6, and ratio 117.05.  (a) CT A/P on 03/01/2020 shows "innumerable" lytic lesions involving all visualized bones  (b) bone marrow biopsy 03/11/2020 shows 20% plasmacytosis by CD138, 12% by manual differential; molecular studies not requested  (c) Bortezomib  and Dexamethasone  given weekly 3 weeks on and 1 week off beginning 03/11/2020, with good response, last dose 07/24/2020  (d) transitioned to lenalidomide  10 mg daily 21/7 starting 07/31/2020, this was stopped because of  worsening cytopenias. He took it for almost 2.5 yrs     (2) lytic bone lesions/ hypercalcemia:  (a) Pamidronate  administered on 03/01/2020  (b) denosumab /Xgeva  started 04/17/2020-- repeat every 12 weeks   (a) to take TUMS 3 tabs on day of Xgeva  dose  PLAN:   MM in remission Myeloma labs from April appear to be in remission Continue surveillance.  Chest pain, likely from dyspepsia This has resolved  He can take pepcid  as needed.  FU in 8 weeks with labs.

## 2024-04-01 LAB — IGG, IGA, IGM
IgA: 217 mg/dL (ref 61–437)
IgG (Immunoglobin G), Serum: 1347 mg/dL (ref 603–1613)
IgM (Immunoglobulin M), Srm: 52 mg/dL (ref 15–143)

## 2024-04-03 LAB — KAPPA/LAMBDA LIGHT CHAINS
Kappa free light chain: 24.7 mg/L — ABNORMAL HIGH (ref 3.3–19.4)
Kappa, lambda light chain ratio: 2.4 — ABNORMAL HIGH (ref 0.26–1.65)
Lambda free light chains: 10.3 mg/L (ref 5.7–26.3)

## 2024-04-03 LAB — PROTEIN ELECTROPHORESIS, SERUM, WITH REFLEX
A/G Ratio: 1.3 (ref 0.7–1.7)
Albumin ELP: 3.5 g/dL (ref 2.9–4.4)
Alpha-1-Globulin: 0.2 g/dL (ref 0.0–0.4)
Alpha-2-Globulin: 0.6 g/dL (ref 0.4–1.0)
Beta Globulin: 0.8 g/dL (ref 0.7–1.3)
Gamma Globulin: 1.2 g/dL (ref 0.4–1.8)
Globulin, Total: 2.8 g/dL (ref 2.2–3.9)
Total Protein ELP: 6.3 g/dL (ref 6.0–8.5)

## 2024-04-06 ENCOUNTER — Other Ambulatory Visit: Payer: Self-pay | Admitting: Hematology and Oncology

## 2024-04-08 ENCOUNTER — Other Ambulatory Visit: Payer: Self-pay | Admitting: Hematology and Oncology

## 2024-04-08 DIAGNOSIS — D472 Monoclonal gammopathy: Secondary | ICD-10-CM

## 2024-04-09 ENCOUNTER — Encounter: Payer: Self-pay | Admitting: Oncology

## 2024-04-10 ENCOUNTER — Other Ambulatory Visit: Payer: Self-pay | Admitting: Hematology and Oncology

## 2024-04-10 DIAGNOSIS — I10 Essential (primary) hypertension: Secondary | ICD-10-CM | POA: Diagnosis not present

## 2024-04-10 DIAGNOSIS — E785 Hyperlipidemia, unspecified: Secondary | ICD-10-CM | POA: Diagnosis not present

## 2024-04-10 DIAGNOSIS — D472 Monoclonal gammopathy: Secondary | ICD-10-CM

## 2024-04-10 DIAGNOSIS — N400 Enlarged prostate without lower urinary tract symptoms: Secondary | ICD-10-CM | POA: Diagnosis not present

## 2024-04-10 DIAGNOSIS — D509 Iron deficiency anemia, unspecified: Secondary | ICD-10-CM | POA: Diagnosis not present

## 2024-04-10 DIAGNOSIS — R5383 Other fatigue: Secondary | ICD-10-CM | POA: Diagnosis not present

## 2024-04-10 DIAGNOSIS — D61818 Other pancytopenia: Secondary | ICD-10-CM | POA: Diagnosis not present

## 2024-04-11 ENCOUNTER — Encounter: Payer: Self-pay | Admitting: Oncology

## 2024-04-12 ENCOUNTER — Other Ambulatory Visit: Payer: Self-pay | Admitting: Hematology and Oncology

## 2024-04-12 DIAGNOSIS — C9000 Multiple myeloma not having achieved remission: Secondary | ICD-10-CM

## 2024-04-13 ENCOUNTER — Encounter: Payer: Self-pay | Admitting: Oncology

## 2024-04-13 ENCOUNTER — Other Ambulatory Visit: Payer: Self-pay | Admitting: Hematology and Oncology

## 2024-04-13 DIAGNOSIS — D472 Monoclonal gammopathy: Secondary | ICD-10-CM

## 2024-04-14 ENCOUNTER — Other Ambulatory Visit: Payer: Self-pay

## 2024-04-14 DIAGNOSIS — D472 Monoclonal gammopathy: Secondary | ICD-10-CM

## 2024-04-14 MED ORDER — FINASTERIDE 5 MG PO TABS
5.0000 mg | ORAL_TABLET | Freq: Every day | ORAL | 3 refills | Status: AC
Start: 2024-04-14 — End: ?

## 2024-04-17 DIAGNOSIS — G609 Hereditary and idiopathic neuropathy, unspecified: Secondary | ICD-10-CM | POA: Diagnosis not present

## 2024-04-17 DIAGNOSIS — M1A09X0 Idiopathic chronic gout, multiple sites, without tophus (tophi): Secondary | ICD-10-CM | POA: Diagnosis not present

## 2024-04-17 DIAGNOSIS — E785 Hyperlipidemia, unspecified: Secondary | ICD-10-CM | POA: Diagnosis not present

## 2024-04-17 DIAGNOSIS — D509 Iron deficiency anemia, unspecified: Secondary | ICD-10-CM | POA: Diagnosis not present

## 2024-04-17 DIAGNOSIS — Z0000 Encounter for general adult medical examination without abnormal findings: Secondary | ICD-10-CM | POA: Diagnosis not present

## 2024-04-17 DIAGNOSIS — I10 Essential (primary) hypertension: Secondary | ICD-10-CM | POA: Diagnosis not present

## 2024-04-17 DIAGNOSIS — C9000 Multiple myeloma not having achieved remission: Secondary | ICD-10-CM | POA: Diagnosis not present

## 2024-04-17 DIAGNOSIS — D61818 Other pancytopenia: Secondary | ICD-10-CM | POA: Diagnosis not present

## 2024-04-17 DIAGNOSIS — D472 Monoclonal gammopathy: Secondary | ICD-10-CM | POA: Diagnosis not present

## 2024-04-17 DIAGNOSIS — N400 Enlarged prostate without lower urinary tract symptoms: Secondary | ICD-10-CM | POA: Diagnosis not present

## 2024-05-04 ENCOUNTER — Other Ambulatory Visit: Payer: Self-pay | Admitting: Hematology and Oncology

## 2024-05-08 ENCOUNTER — Other Ambulatory Visit: Payer: Self-pay | Admitting: Hematology and Oncology

## 2024-05-08 DIAGNOSIS — C9000 Multiple myeloma not having achieved remission: Secondary | ICD-10-CM

## 2024-05-15 ENCOUNTER — Ambulatory Visit (INDEPENDENT_AMBULATORY_CARE_PROVIDER_SITE_OTHER): Payer: Medicare Other

## 2024-05-15 DIAGNOSIS — I442 Atrioventricular block, complete: Secondary | ICD-10-CM

## 2024-05-15 LAB — CUP PACEART REMOTE DEVICE CHECK
Battery Remaining Longevity: 84 mo
Battery Remaining Percentage: 84 %
Battery Voltage: 3.01 V
Brady Statistic AP VP Percent: 64 %
Brady Statistic AP VS Percent: 1 %
Brady Statistic AS VP Percent: 36 %
Brady Statistic AS VS Percent: 1 %
Brady Statistic RA Percent Paced: 64 %
Brady Statistic RV Percent Paced: 99 %
Date Time Interrogation Session: 20250616020020
Implantable Lead Connection Status: 753985
Implantable Lead Connection Status: 753985
Implantable Lead Implant Date: 20231215
Implantable Lead Implant Date: 20231215
Implantable Lead Location: 753859
Implantable Lead Location: 753860
Implantable Lead Model: 1944
Implantable Lead Model: 1948
Implantable Pulse Generator Implant Date: 20231215
Lead Channel Impedance Value: 490 Ohm
Lead Channel Impedance Value: 550 Ohm
Lead Channel Pacing Threshold Amplitude: 0.5 V
Lead Channel Pacing Threshold Amplitude: 0.5 V
Lead Channel Pacing Threshold Pulse Width: 0.5 ms
Lead Channel Pacing Threshold Pulse Width: 0.5 ms
Lead Channel Sensing Intrinsic Amplitude: 12 mV
Lead Channel Sensing Intrinsic Amplitude: 2.2 mV
Lead Channel Setting Pacing Amplitude: 2.5 V
Lead Channel Setting Pacing Amplitude: 2.5 V
Lead Channel Setting Pacing Pulse Width: 0.5 ms
Lead Channel Setting Sensing Sensitivity: 4 mV
Pulse Gen Model: 2272
Pulse Gen Serial Number: 8129463

## 2024-05-16 ENCOUNTER — Ambulatory Visit: Payer: Self-pay | Admitting: Cardiology

## 2024-06-21 ENCOUNTER — Telehealth: Payer: Self-pay

## 2024-06-22 ENCOUNTER — Ambulatory Visit: Payer: Self-pay | Admitting: Hematology and Oncology

## 2024-06-22 ENCOUNTER — Inpatient Hospital Stay: Attending: Hematology and Oncology

## 2024-06-22 ENCOUNTER — Inpatient Hospital Stay (HOSPITAL_BASED_OUTPATIENT_CLINIC_OR_DEPARTMENT_OTHER): Admitting: Hematology and Oncology

## 2024-06-22 VITALS — BP 124/63 | HR 79 | Temp 97.6°F | Resp 20 | Wt 188.1 lb

## 2024-06-22 DIAGNOSIS — C9001 Multiple myeloma in remission: Secondary | ICD-10-CM | POA: Diagnosis not present

## 2024-06-22 DIAGNOSIS — J3489 Other specified disorders of nose and nasal sinuses: Secondary | ICD-10-CM | POA: Diagnosis not present

## 2024-06-22 DIAGNOSIS — R0981 Nasal congestion: Secondary | ICD-10-CM | POA: Insufficient documentation

## 2024-06-22 LAB — CMP (CANCER CENTER ONLY)
ALT: 10 U/L (ref 0–44)
AST: 18 U/L (ref 15–41)
Albumin: 4 g/dL (ref 3.5–5.0)
Alkaline Phosphatase: 42 U/L (ref 38–126)
Anion gap: 7 (ref 5–15)
BUN: 31 mg/dL — ABNORMAL HIGH (ref 8–23)
CO2: 29 mmol/L (ref 22–32)
Calcium: 9.2 mg/dL (ref 8.9–10.3)
Chloride: 103 mmol/L (ref 98–111)
Creatinine: 1.41 mg/dL — ABNORMAL HIGH (ref 0.61–1.24)
GFR, Estimated: 47 mL/min — ABNORMAL LOW (ref >60)
Glucose, Bld: 88 mg/dL (ref 70–99)
Potassium: 4 mmol/L (ref 3.5–5.1)
Sodium: 139 mmol/L (ref 135–145)
Total Bilirubin: 0.6 mg/dL (ref 0.0–1.2)
Total Protein: 6.9 g/dL (ref 6.5–8.1)

## 2024-06-22 LAB — CBC WITH DIFFERENTIAL/PLATELET
Abs Immature Granulocytes: 0.01 K/uL (ref 0.00–0.07)
Basophils Absolute: 0 K/uL (ref 0.0–0.1)
Basophils Relative: 0 %
Eosinophils Absolute: 0.1 K/uL (ref 0.0–0.5)
Eosinophils Relative: 2 %
HCT: 35.3 % — ABNORMAL LOW (ref 39.0–52.0)
Hemoglobin: 11 g/dL — ABNORMAL LOW (ref 13.0–17.0)
Immature Granulocytes: 0 %
Lymphocytes Relative: 28 %
Lymphs Abs: 1.5 K/uL (ref 0.7–4.0)
MCH: 24.8 pg — ABNORMAL LOW (ref 26.0–34.0)
MCHC: 31.2 g/dL (ref 30.0–36.0)
MCV: 79.5 fL — ABNORMAL LOW (ref 80.0–100.0)
Monocytes Absolute: 0.5 K/uL (ref 0.1–1.0)
Monocytes Relative: 9 %
Neutro Abs: 3.2 K/uL (ref 1.7–7.7)
Neutrophils Relative %: 61 %
Platelets: 141 K/uL — ABNORMAL LOW (ref 150–400)
RBC: 4.44 MIL/uL (ref 4.22–5.81)
RDW: 12.7 % (ref 11.5–15.5)
WBC: 5.2 K/uL (ref 4.0–10.5)
nRBC: 0 % (ref 0.0–0.2)

## 2024-06-22 NOTE — Progress Notes (Signed)
 Remote pacemaker transmission.

## 2024-06-22 NOTE — Progress Notes (Signed)
 [facility]  [phone] [phone]     ID: Joe [name] DOB: December 10, 1928  MR#: [id number]  RDW#:[id number]  Patient Care Team: Verdia Lombard, MD as PCP - General (Internal Medicine) Fernande Elspeth BROCKS, MD as PCP - Electrophysiology (Cardiology) Luevenia, Reena Loretha Ash, MD as Consulting Physician (Hematology and Oncology) Ash Loretha, MD  CHIEF COMPLAINT: multiple myeloma  CURRENT TREATMENT: denosumab  Xgeva  every 12 weeks;   INTERVAL HISTORY:  History of Present Illness    Discussed the use of AI scribe software for clinical note transcription with the patient, who gave verbal consent to proceed.  History of Present Illness Joe [name] is a 89 year old male with myeloma who presents for routine follow-up.  He feels generally well with no significant changes since the last visit. He experiences occasional fatigue and sinus congestion, which he attributes to hay fever. No real pain is reported, but there is occasional mild back pain associated with lying down.  No fevers, drenching night sweats, weight loss, or changes in appetite. Breathing is unchanged, and there are no issues with bowel movements or urination.  He is not currently taking any new medications.  Rest of the pertinent 10 point ROS reviewed and neg.  Lab Results  Component Value Date   KAPLAMBRATIO 2.40 (H) 03/31/2024   KAPLAMBRATIO 2.03 (H) 03/02/2024   KAPLAMBRATIO 2.23 (H) 01/27/2024   KAPLAMBRATIO 2.32 (H) 11/29/2023   KAPLAMBRATIO 2.25 (H) 09/30/2023    REVIEW OF SYSTEMS:  Rest of the pertinent 10 point ROS reviewed and negative   COVID 19 VACCINATION STATUS: received Pfizer x2 with booster October 2021.  He received evusheld on 03/20/2021 and is due a second dose   HISTORY OF CURRENT ILLNESS:  From the original intake note:  Mr. Joe [name] is an 89 y/o [place] man formerly followed by my partner Dr Sherrod for M-GUS. We have an M-Spike of 0.87  documented 07/12/2012. [name] kappa/lambda light chains and total IgG were follower yearly until 01/2016, when they were 2.36 and 1517 respectively (unremarkable). He was lost to follow-up after that point.  [02/29/2020] the patient presented to Urgent Care with poorly controlled pain. This was felt to be mussculoskeletal and he was started on a medrol  dose-pak and robaxin . However his pain worsened and he presented to the ED at [facility] where vitals and exam were unremarkable but labs showed a creatinine of 1.84 (baseline 1.0), calcium  12.7 with albumin 2.8 and total protein 10.4 (globulin fraction 7.8).  CT of the abdomen obtained for evaluation of his abdominal and back pain showed no hydronephrosis but multiple lytic lesons.   The patient's subsequent history is as detailed below.  PAST MEDICAL HISTORY: Past Medical History:  Diagnosis Date   Enlarged prostate    Hypertension    Melanoma (HCC) 2021   Multiple myeloma (HCC)     PAST SURGICAL HISTORY: Past Surgical History:  Procedure Laterality Date   EYE SURGERY     PACEMAKER IMPLANT N/A 11/13/2022   Procedure: PACEMAKER IMPLANT;  Surgeon: Fernande Elspeth BROCKS, MD;  Location: [facility] INVASIVE CV LAB;  Service: Cardiovascular;  Laterality: N/A;   PROSTATE SURGERY      FAMILY HISTORY Family History  Problem Relation Age of Onset   Hypertension Mother     SOCIAL HISTORY:  Retired, used to work for the [employer]. Lives with wife [place]. Has 4 children, 2 in [place], one in [place], one in [place]; 5 grandchildren and 5 great-grandchildren. Attends a local [address]  church.    ADVANCED DIRECTIVES: at the initial visit the patient confirmed his wife is his HCPOA (despite her memory issues).   HEALTH MAINTENANCE: Social History   Tobacco Use   Smoking status: Never   Smokeless tobacco: Never  Vaping Use   Vaping status: Never Used  Substance Use Topics   Alcohol use: Never   Drug use: Never      Colonoscopy: 6-8 years ago     No Known Allergies  Current Outpatient Medications  Medication Sig Dispense Refill   amLODipine  (NORVASC ) 5 MG tablet Take 5 mg by mouth daily.     calcium  carbonate (TUMS - DOSED IN MG ELEMENTAL CALCIUM ) 500 MG chewable tablet Chew 3 tablets by mouth daily.     doxazosin  (CARDURA ) 4 MG tablet Take 4 mg by mouth at bedtime.     finasteride  (PROSCAR ) 5 MG tablet Take 1 tablet (5 mg total) by mouth daily. 30 tablet 3   fosinopril  (MONOPRIL ) 40 MG tablet Take 40 mg by mouth daily.     gabapentin  (NEURONTIN ) 100 MG capsule TAKE 1 CAPSULE BY MOUTH THREE TIMES DAILY 90 capsule 0   indapamide  (LOZOL ) 2.5 MG tablet Take 2.5 mg by mouth daily.      potassium chloride  (KLOR-CON ) 10 MEQ tablet Take 2 tablets by mouth once daily 180 tablet 0   pravastatin  (PRAVACHOL ) 40 MG tablet Take 40 mg by mouth daily.     prednisoLONE acetate (PRED FORTE) 1 % ophthalmic suspension Place 1 drop into the left eye 2 (two) times daily.     tamsulosin  (FLOMAX ) 0.4 MG CAPS capsule TAKE 1 CAPSULE BY MOUTH ONCE DAILY AFTER SUPPER 90 capsule 0   famotidine  (PEPCID ) 20 MG tablet Take 1 tablet by mouth once daily (Patient not taking: Reported on 06/22/2024) 90 tablet 0   MAGNESIUM -OXIDE 400 (240 Mg) MG tablet TAKE 1 TABLET BY MOUTH TWICE DAILY FOR 2 WEEKS THEN REDUCE BACK TO 1 TABLET DAILY (Patient taking differently: Take 400 mg by mouth See admin instructions. TAKE 1 TABLET BY MOUTH TWICE DAILY FOR 2 WEEKS THEN REDUCE BACK TO 1 TABLET DAILY) 180 tablet 0   No current facility-administered medications for this visit.    OBJECTIVE: African-American man in no acute distress Vitals:   06/22/24 1330  BP: 124/63  Pulse: 79  Resp: 20  Temp: 97.6 F (36.4 C)  SpO2: 98%          Body mass index is 25.51 kg/m.   Wt Readings from Last 3 Encounters:  06/22/24 188 lb 1.6 oz (85.3 kg)  03/31/24 188 lb 14.4 oz (85.7 kg)  02/21/24 187 lb (84.8 kg)  ECOG FS:1 - Symptomatic but  completely ambulatory  Physical Exam Constitutional:      Appearance: Normal appearance.  HENT:     Head: Normocephalic and atraumatic.  Cardiovascular:     Rate and Rhythm: Normal rate and regular rhythm.  Abdominal:     General: Abdomen is flat.     Palpations: Abdomen is soft.  Musculoskeletal:        General: No swelling or tenderness.     Cervical back: Normal range of motion and neck supple.  Neurological:     General: No focal deficit present.     Mental Status: He is alert.  Psychiatric:        Mood and Affect: Mood normal.      LAB RESULTS:  CMP     Component Value Date/Time   NA 139 03/31/2024 1251  NA 137 12/01/2022 1337   NA 140 01/09/2016 1045   K 4.6 03/31/2024 1251   K 3.8 01/09/2016 1045   CL 105 03/31/2024 1251   CO2 31 03/31/2024 1251   CO2 27 01/09/2016 1045   GLUCOSE 118 (H) 03/31/2024 1251   GLUCOSE 89 01/09/2016 1045   BUN 22 03/31/2024 1251   BUN 28 12/01/2022 1337   BUN 14.7 01/09/2016 1045   CREATININE 1.22 03/31/2024 1251   CREATININE 1.0 01/09/2016 1045   CALCIUM  8.9 03/31/2024 1251   CALCIUM  9.2 01/09/2016 1045   PROT 6.6 03/31/2024 1251   PROT 7.3 01/09/2016 1045   ALBUMIN 3.8 03/31/2024 1251   ALBUMIN 3.7 01/09/2016 1045   AST 16 03/31/2024 1251   AST 18 01/09/2016 1045   ALT 9 03/31/2024 1251   ALT 15 01/09/2016 1045   ALKPHOS 42 03/31/2024 1251   ALKPHOS 60 01/09/2016 1045   BILITOT 0.5 03/31/2024 1251   BILITOT 0.51 01/09/2016 1045   GFRNONAA 56 (L) 03/31/2024 1251   GFRAA >60 08/30/2020 1402    Lab Results  Component Value Date   TOTALPROTELP 6.3 03/31/2024   ALBUMINELP 3.5 03/31/2024   A1GS 0.2 03/31/2024   A2GS 0.6 03/31/2024   BETS 0.8 03/31/2024   BETA2SER 3.6 07/12/2012   GAMS 1.2 03/31/2024   MSPIKE Not Observed 03/31/2024   SPEI * 07/12/2012     Lab Results  Component Value Date   KPAFRELGTCHN 24.7 (H) 03/31/2024   LAMBDASER 10.3 03/31/2024   KAPLAMBRATIO 2.40 (H) 03/31/2024    Lab Results   Component Value Date   WBC 5.2 06/22/2024   NEUTROABS 3.2 06/22/2024   HGB 11.0 (L) 06/22/2024   HCT 35.3 (L) 06/22/2024   MCV 79.5 (L) 06/22/2024   PLT 141 (L) 06/22/2024      Chemistry      Component Value Date/Time   NA 139 03/31/2024 1251   NA 137 12/01/2022 1337   NA 140 01/09/2016 1045   K 4.6 03/31/2024 1251   K 3.8 01/09/2016 1045   CL 105 03/31/2024 1251   CO2 31 03/31/2024 1251   CO2 27 01/09/2016 1045   BUN 22 03/31/2024 1251   BUN 28 12/01/2022 1337   BUN 14.7 01/09/2016 1045   CREATININE 1.22 03/31/2024 1251   CREATININE 1.0 01/09/2016 1045      Component Value Date/Time   CALCIUM  8.9 03/31/2024 1251   CALCIUM  9.2 01/09/2016 1045   ALKPHOS 42 03/31/2024 1251   ALKPHOS 60 01/09/2016 1045   AST 16 03/31/2024 1251   AST 18 01/09/2016 1045   ALT 9 03/31/2024 1251   ALT 15 01/09/2016 1045   BILITOT 0.5 03/31/2024 1251   BILITOT 0.51 01/09/2016 1045       No results found for: LABCA2  No components found for: [id number]  No results for input(s): INR in the last 168 hours.  No results found for: LABCA2  No results found for: CAN199  No results found for: CAN125  No results found for: CAN153  No results found for: CA2729  No components found for: HGQUANT  No results found for: CEA1, CEA / No results found for: CEA1, CEA   No results found for: AFPTUMOR  No results found for: CHROMOGRNA  No results found for: HGBA, HGBA2QUANT, HGBFQUANT, HGBSQUAN (Hemoglobinopathy evaluation)   Lab Results  Component Value Date   LDH 136 01/09/2016    Lab Results  Component Value Date   IRON 28 (L) 03/26/2022   TIBC 304  03/26/2022   IRONPCTSAT 9 (L) 03/26/2022   (Iron and TIBC)  Lab Results  Component Value Date   FERRITIN 142 05/25/2022    Urinalysis    Component Value Date/Time   COLORURINE AMBER (A) 03/01/2020 1052   APPEARANCEUR CLOUDY (A) 03/01/2020 1052   LABSPEC 1.021 03/01/2020 1052   PHURINE  5.0 03/01/2020 1052   GLUCOSEU NEGATIVE 03/01/2020 1052   HGBUR NEGATIVE 03/01/2020 1052   BILIRUBINUR NEGATIVE 03/01/2020 1052   KETONESUR NEGATIVE 03/01/2020 1052   PROTEINUR 100 (A) 03/01/2020 1052   UROBILINOGEN 0.2 02/29/2020 1537   NITRITE NEGATIVE 03/01/2020 1052   LEUKOCYTESUR NEGATIVE 03/01/2020 1052    STUDIES: No results found.    ELIGIBLE FOR AVAILABLE RESEARCH PROTOCOL:   ASSESSMENT: 89 y.o.  [address] man with a history of M-GUS, presenting 02/29/2020 with a high globulin fraction, worsening renal function, hypercalcemia, anemia and multiple lytic bone lesions.    (1) IgG Kappa Multiple Myeloma: labs diagnostic on 03/01/2020 with Mspike of 3.8, Kappa free light chain 690.6, and ratio 117.05.  (a) CT A/P on 03/01/2020 shows innumerable lytic lesions involving all visualized bones  (b) bone marrow biopsy 03/11/2020 shows 20% plasmacytosis by CD138, 12% by manual differential; molecular studies not requested  (c) Bortezomib  and Dexamethasone  given weekly 3 weeks on and 1 week off beginning 03/11/2020, with good response, last dose 07/24/2020  (d) transitioned to lenalidomide  10 mg daily 21/7 starting 07/31/2020, this was stopped because of worsening cytopenias. He took it for almost 2.5 yrs     (2) lytic bone lesions/ hypercalcemia:  (a) Pamidronate  administered on 03/01/2020  (b) denosumab /Xgeva  started 04/17/2020-- repeat every 12 weeks   (a) to take TUMS 3 tabs on day of Xgeva  dose  PLAN:  Assessment and Plan Assessment & Plan MM in remission. Multiple myeloma remains inactive with stable blood counts based on last labs. Awaiting myeloma panel results. - Review myeloma panel results once available. - Schedule follow-up appointment in eight weeks.  Allergic rhinitis Sinus congestion and nasal obstruction likely due to allergic rhinitis.  FU in 8 weeks with labs.

## 2024-06-23 ENCOUNTER — Encounter: Payer: Self-pay | Admitting: Oncology

## 2024-06-23 LAB — KAPPA/LAMBDA LIGHT CHAINS
Kappa free light chain: 27.6 mg/L — ABNORMAL HIGH (ref 3.3–19.4)
Kappa, lambda light chain ratio: 2.12 — ABNORMAL HIGH (ref 0.26–1.65)
Lambda free light chains: 13 mg/L (ref 5.7–26.3)

## 2024-06-23 NOTE — Telephone Encounter (Addendum)
 Pt called- discussed recommendation. Pt stated appreciation of call.   ----- Message from [facility] sent at 06/22/2024  2:46 PM EDT ----- He appears to be a bit dehydrated, can u encourage hydration.  Thanks, ----- Message ----- From: Rebecka, [facility] [place] Sent: 06/22/2024   1:34 PM EDT To: Amber Stalls, MD

## 2024-06-25 LAB — MULTIPLE MYELOMA PANEL, SERUM
Albumin SerPl Elph-Mcnc: 3.7 g/dL (ref 2.9–4.4)
Albumin/Glob SerPl: 1.2 (ref 0.7–1.7)
Alpha 1: 0.2 g/dL (ref 0.0–0.4)
Alpha2 Glob SerPl Elph-Mcnc: 0.7 g/dL (ref 0.4–1.0)
B-Globulin SerPl Elph-Mcnc: 0.9 g/dL (ref 0.7–1.3)
Gamma Glob SerPl Elph-Mcnc: 1.3 g/dL (ref 0.4–1.8)
Globulin, Total: 3.2 g/dL (ref 2.2–3.9)
IgA: 221 mg/dL (ref 61–437)
IgG (Immunoglobin G), Serum: 1352 mg/dL (ref 603–1613)
IgM (Immunoglobulin M), Srm: 56 mg/dL (ref 15–143)
Total Protein ELP: 6.9 g/dL (ref 6.0–8.5)

## 2024-07-12 ENCOUNTER — Other Ambulatory Visit: Payer: Self-pay | Admitting: Internal Medicine

## 2024-07-16 ENCOUNTER — Other Ambulatory Visit: Payer: Self-pay | Admitting: Hematology and Oncology

## 2024-07-24 ENCOUNTER — Telehealth: Payer: Self-pay | Admitting: Hematology and Oncology

## 2024-07-24 NOTE — Telephone Encounter (Signed)
 Joe [name] has re-scheduled his appointment for 9/22. He stated that he needed a Monday for transportation purposes.

## 2024-08-03 ENCOUNTER — Other Ambulatory Visit: Payer: Self-pay | Admitting: Hematology and Oncology

## 2024-08-03 DIAGNOSIS — C9000 Multiple myeloma not having achieved remission: Secondary | ICD-10-CM

## 2024-08-14 ENCOUNTER — Ambulatory Visit: Admitting: Hematology and Oncology

## 2024-08-14 ENCOUNTER — Ambulatory Visit (INDEPENDENT_AMBULATORY_CARE_PROVIDER_SITE_OTHER): Payer: Medicare Other

## 2024-08-14 ENCOUNTER — Other Ambulatory Visit

## 2024-08-14 DIAGNOSIS — I442 Atrioventricular block, complete: Secondary | ICD-10-CM

## 2024-08-16 ENCOUNTER — Other Ambulatory Visit

## 2024-08-16 ENCOUNTER — Ambulatory Visit: Admitting: Hematology and Oncology

## 2024-08-16 LAB — CUP PACEART REMOTE DEVICE CHECK
Battery Remaining Longevity: 82 mo
Battery Remaining Percentage: 81 %
Battery Voltage: 3.01 V
Brady Statistic AP VP Percent: 67 %
Brady Statistic AP VS Percent: 1 %
Brady Statistic AS VP Percent: 33 %
Brady Statistic AS VS Percent: 1 %
Brady Statistic RA Percent Paced: 67 %
Brady Statistic RV Percent Paced: 99 %
Date Time Interrogation Session: 20250917015013
Implantable Lead Connection Status: 753985
Implantable Lead Connection Status: 753985
Implantable Lead Implant Date: 20231215
Implantable Lead Implant Date: 20231215
Implantable Lead Location: 753859
Implantable Lead Location: 753860
Implantable Lead Model: 1944
Implantable Lead Model: 1948
Implantable Pulse Generator Implant Date: 20231215
Lead Channel Impedance Value: 510 Ohm
Lead Channel Impedance Value: 550 Ohm
Lead Channel Pacing Threshold Amplitude: 0.5 V
Lead Channel Pacing Threshold Amplitude: 0.5 V
Lead Channel Pacing Threshold Pulse Width: 0.5 ms
Lead Channel Pacing Threshold Pulse Width: 0.5 ms
Lead Channel Sensing Intrinsic Amplitude: 12 mV
Lead Channel Sensing Intrinsic Amplitude: 3.1 mV
Lead Channel Setting Pacing Amplitude: 2.5 V
Lead Channel Setting Pacing Amplitude: 2.5 V
Lead Channel Setting Pacing Pulse Width: 0.5 ms
Lead Channel Setting Sensing Sensitivity: 4 mV
Pulse Gen Model: 2272
Pulse Gen Serial Number: 8129463

## 2024-08-17 ENCOUNTER — Ambulatory Visit: Payer: Self-pay | Admitting: Cardiology

## 2024-08-18 ENCOUNTER — Other Ambulatory Visit: Payer: Self-pay | Admitting: Hematology and Oncology

## 2024-08-21 ENCOUNTER — Other Ambulatory Visit

## 2024-08-21 ENCOUNTER — Ambulatory Visit: Admitting: Hematology and Oncology

## 2024-08-21 NOTE — Progress Notes (Signed)
 Remote PPM Transmission

## 2024-08-29 ENCOUNTER — Telehealth: Payer: Self-pay

## 2024-08-29 NOTE — Telephone Encounter (Signed)
 Spoke with patient and confirmed appointment on 10/1

## 2024-08-30 ENCOUNTER — Inpatient Hospital Stay: Admitting: Hematology and Oncology

## 2024-08-30 ENCOUNTER — Inpatient Hospital Stay: Attending: Hematology and Oncology

## 2024-08-30 ENCOUNTER — Other Ambulatory Visit: Payer: Self-pay

## 2024-08-30 VITALS — BP 134/56 | HR 72 | Temp 97.6°F | Resp 18 | Wt 190.7 lb

## 2024-08-30 DIAGNOSIS — C9001 Multiple myeloma in remission: Secondary | ICD-10-CM | POA: Diagnosis not present

## 2024-08-30 DIAGNOSIS — K5900 Constipation, unspecified: Secondary | ICD-10-CM | POA: Diagnosis not present

## 2024-08-30 DIAGNOSIS — G629 Polyneuropathy, unspecified: Secondary | ICD-10-CM | POA: Insufficient documentation

## 2024-08-30 DIAGNOSIS — Z950 Presence of cardiac pacemaker: Secondary | ICD-10-CM | POA: Diagnosis not present

## 2024-08-30 DIAGNOSIS — Z79899 Other long term (current) drug therapy: Secondary | ICD-10-CM | POA: Diagnosis not present

## 2024-08-30 LAB — CBC WITH DIFFERENTIAL/PLATELET
Abs Immature Granulocytes: 0.01 K/uL (ref 0.00–0.07)
Basophils Absolute: 0 K/uL (ref 0.0–0.1)
Basophils Relative: 0 %
Eosinophils Absolute: 0.1 K/uL (ref 0.0–0.5)
Eosinophils Relative: 3 %
HCT: 33.9 % — ABNORMAL LOW (ref 39.0–52.0)
Hemoglobin: 10.6 g/dL — ABNORMAL LOW (ref 13.0–17.0)
Immature Granulocytes: 0 %
Lymphocytes Relative: 22 %
Lymphs Abs: 1.2 K/uL (ref 0.7–4.0)
MCH: 25.1 pg — ABNORMAL LOW (ref 26.0–34.0)
MCHC: 31.3 g/dL (ref 30.0–36.0)
MCV: 80.1 fL (ref 80.0–100.0)
Monocytes Absolute: 0.5 K/uL (ref 0.1–1.0)
Monocytes Relative: 9 %
Neutro Abs: 3.5 K/uL (ref 1.7–7.7)
Neutrophils Relative %: 66 %
Platelets: 156 K/uL (ref 150–400)
RBC: 4.23 MIL/uL (ref 4.22–5.81)
RDW: 13 % (ref 11.5–15.5)
WBC: 5.3 K/uL (ref 4.0–10.5)
nRBC: 0 % (ref 0.0–0.2)

## 2024-08-30 LAB — CMP (CANCER CENTER ONLY)
ALT: 10 U/L (ref 0–44)
AST: 17 U/L (ref 15–41)
Albumin: 3.8 g/dL (ref 3.5–5.0)
Alkaline Phosphatase: 43 U/L (ref 38–126)
Anion gap: 3 — ABNORMAL LOW (ref 5–15)
BUN: 25 mg/dL — ABNORMAL HIGH (ref 8–23)
CO2: 32 mmol/L (ref 22–32)
Calcium: 9.1 mg/dL (ref 8.9–10.3)
Chloride: 101 mmol/L (ref 98–111)
Creatinine: 1.4 mg/dL — ABNORMAL HIGH (ref 0.61–1.24)
GFR, Estimated: 47 mL/min — ABNORMAL LOW (ref >60)
Glucose, Bld: 113 mg/dL — ABNORMAL HIGH (ref 70–99)
Potassium: 4.5 mmol/L (ref 3.5–5.1)
Sodium: 136 mmol/L (ref 135–145)
Total Bilirubin: 0.5 mg/dL (ref 0.0–1.2)
Total Protein: 6.3 g/dL — ABNORMAL LOW (ref 6.5–8.1)

## 2024-08-30 NOTE — Progress Notes (Signed)
 [facility]  Telephone:([phone]) [phone] [phone]     ID: Joe [name] DOB: [birth year]  MR#: [id number]  RDW#:[id number]  Patient Care Team: Verdia Lombard, MD as PCP - General (Internal Medicine) Fernande Elspeth BROCKS, MD as PCP - Electrophysiology (Cardiology) Luevenia, Joe Loretha Ash, MD as Consulting Physician (Hematology and Oncology) [name] Loretha, MD  CHIEF COMPLAINT: multiple myeloma  CURRENT TREATMENT: denosumab  Xgeva  every 12 weeks;   INTERVAL HISTORY:  History of Present Illness    Discussed the use of AI scribe software for clinical note transcription with the patient, who gave verbal consent to proceed.  History of Present Illness  Joe [name] is a 89 year old male with multiple myeloma in remission who presents for routine follow-up.  He is doing well since his last visit with no significant changes in his condition.  He experiences worsening neuropathy and is currently taking gabapentin  three times a day. Despite this, the neuropathy is getting worse.  He has trouble with constipation, a recurring issue mentioned in previous visits. No other gastrointestinal symptoms such as changes in appetite or weight loss are reported.  No fevers, night sweats, bone pains, or swelling. No changes in breathing.  He has a pacemaker with no issues, stating it is not firing unnecessarily. He believes his heart is functioning well without any concerning episodes.  He is the primary caregiver for his wife, who has dementia, which limits his ability to engage in outdoor activities. He no longer mows the lawn but remains active by trimming hedges.  Rest of the pertinent 10 point ROS reviewed and neg.  Lab Results  Component Value Date   KAPLAMBRATIO 2.12 (H) 06/22/2024   KAPLAMBRATIO 2.40 (H) 03/31/2024   KAPLAMBRATIO 2.03 (H) 03/02/2024   KAPLAMBRATIO 2.23 (H) 01/27/2024   KAPLAMBRATIO 2.32 (H) 11/29/2023    REVIEW OF SYSTEMS:  Rest of  the pertinent 10 point ROS reviewed and negative   COVID 19 VACCINATION STATUS: received Pfizer x2 with booster October 2021.  He received evusheld on 03/20/2021 and is due a second dose   HISTORY OF CURRENT ILLNESS:  From the original intake note:  Mr. Joe [name] is an 89 y/o [place] man formerly followed by my partner Dr Sherrod for M-GUS. We have an M-Spike of 0.87 documented 07/12/2012. [name] kappa/lambda light chains and total IgG were follower yearly until 01/2016, when they were 2.36 and 1517 respectively (unremarkable). He was lost to follow-up after that point.  [02/29/2020] the patient presented to Urgent Care with poorly controlled pain. This was felt to be mussculoskeletal and he was started on a medrol  dose-pak and robaxin . However his pain worsened and he presented to the ED at [facility] where vitals and exam were unremarkable but labs showed a creatinine of 1.84 (baseline 1.0), calcium  12.7 with albumin 2.8 and total protein 10.4 (globulin fraction 7.8).  CT of the abdomen obtained for evaluation of his abdominal and back pain showed no hydronephrosis but multiple lytic lesons.   The patient's subsequent history is as detailed below.  PAST MEDICAL HISTORY: Past Medical History:  Diagnosis Date   Enlarged prostate    Hypertension    Melanoma (HCC) 2021   Multiple myeloma (HCC)     PAST SURGICAL HISTORY: Past Surgical History:  Procedure Laterality Date   EYE SURGERY     PACEMAKER IMPLANT N/A 11/13/2022   Procedure: PACEMAKER IMPLANT;  Surgeon: Fernande Elspeth BROCKS, MD;  Location: [facility] INVASIVE CV LAB;  Service: Cardiovascular;  Laterality: N/A;  PROSTATE SURGERY      FAMILY HISTORY Family History  Problem Relation Age of Onset   Hypertension Mother     SOCIAL HISTORY:  Retired, used to work for the [employer]. Lives with wife [place]. Has 4 children, 2 in [place], one in [place], one in [place]; 5 grandchildren and 5 great-grandchildren.  Attends a [employer].    ADVANCED DIRECTIVES: at the initial visit the patient confirmed his wife is his HCPOA (despite her memory issues).   HEALTH MAINTENANCE: Social History   Tobacco Use   Smoking status: Never   Smokeless tobacco: Never  Vaping Use   Vaping status: Never Used  Substance Use Topics   Alcohol use: Never   Drug use: Never     Colonoscopy: 6-8 years ago     No Known Allergies  Current Outpatient Medications  Medication Sig Dispense Refill   amLODipine  (NORVASC ) 5 MG tablet Take 5 mg by mouth daily.     calcium  carbonate (TUMS - DOSED IN MG ELEMENTAL CALCIUM ) 500 MG chewable tablet Chew 3 tablets by mouth daily.     doxazosin  (CARDURA ) 4 MG tablet Take 4 mg by mouth at bedtime.     famotidine  (PEPCID ) 20 MG tablet Take 1 tablet by mouth once daily (Patient not taking: Reported on 06/22/2024) 90 tablet 0   finasteride  (PROSCAR ) 5 MG tablet Take 1 tablet (5 mg total) by mouth daily. 30 tablet 3   fosinopril  (MONOPRIL ) 40 MG tablet Take 40 mg by mouth daily.     gabapentin  (NEURONTIN ) 100 MG capsule TAKE 1 CAPSULE BY MOUTH THREE TIMES DAILY 90 capsule 0   indapamide  (LOZOL ) 2.5 MG tablet Take 2.5 mg by mouth daily.      MAGnesium -Oxide 400 (240 Mg) MG tablet TAKE 1 TABLET BY MOUTH TWICE DAILY FOR  2  WEEKS  THEN  REDUCE  BACK  TO  1  TABLET  DAILY 180 tablet 3   potassium chloride  (KLOR-CON ) 10 MEQ tablet Take 2 tablets by mouth once daily 180 tablet 0   pravastatin  (PRAVACHOL ) 40 MG tablet Take 40 mg by mouth daily.     prednisoLONE acetate (PRED FORTE) 1 % ophthalmic suspension Place 1 drop into the left eye 2 (two) times daily.     tamsulosin  (FLOMAX ) 0.4 MG CAPS capsule TAKE 1 CAPSULE BY MOUTH ONCE DAILY AFTER SUPPER 90 capsule 0   No current facility-administered medications for this visit.    OBJECTIVE: African-American man in no acute distress Vitals:   08/30/24 1448  BP: (!) 134/56  Pulse: 72  Resp: 18  Temp: 97.6 F (36.4 C)  SpO2:  100%          Body mass index is 25.86 kg/m.   Wt Readings from Last 3 Encounters:  08/30/24 190 lb 11.2 oz (86.5 kg)  06/22/24 188 lb 1.6 oz (85.3 kg)  03/31/24 188 lb 14.4 oz (85.7 kg)  ECOG FS:1 - Symptomatic but completely ambulatory  Physical Exam Constitutional:      Appearance: Normal appearance.  HENT:     Head: Normocephalic and atraumatic.  Cardiovascular:     Rate and Rhythm: Normal rate and regular rhythm.  Abdominal:     General: Abdomen is flat.     Palpations: Abdomen is soft.  Musculoskeletal:        General: No swelling or tenderness.     Cervical back: Normal range of motion and neck supple.  Neurological:     General: No focal deficit  present.     Mental Status: He is alert.  Psychiatric:        Mood and Affect: Mood normal.      LAB RESULTS:  CMP     Component Value Date/Time   NA 139 06/22/2024 1316   NA 137 12/01/2022 1337   NA 140 01/09/2016 1045   K 4.0 06/22/2024 1316   K 3.8 01/09/2016 1045   CL 103 06/22/2024 1316   CO2 29 06/22/2024 1316   CO2 27 01/09/2016 1045   GLUCOSE 88 06/22/2024 1316   GLUCOSE 89 01/09/2016 1045   BUN 31 (H) 06/22/2024 1316   BUN 28 12/01/2022 1337   BUN 14.7 01/09/2016 1045   CREATININE 1.41 (H) 06/22/2024 1316   CREATININE 1.0 01/09/2016 1045   CALCIUM  9.2 06/22/2024 1316   CALCIUM  9.2 01/09/2016 1045   PROT 6.9 06/22/2024 1316   PROT 7.3 01/09/2016 1045   ALBUMIN 4.0 06/22/2024 1316   ALBUMIN 3.7 01/09/2016 1045   AST 18 06/22/2024 1316   AST 18 01/09/2016 1045   ALT 10 06/22/2024 1316   ALT 15 01/09/2016 1045   ALKPHOS 42 06/22/2024 1316   ALKPHOS 60 01/09/2016 1045   BILITOT 0.6 06/22/2024 1316   BILITOT 0.51 01/09/2016 1045   GFRNONAA 47 (L) 06/22/2024 1316   GFRAA >60 08/30/2020 1402    Lab Results  Component Value Date   TOTALPROTELP 6.9 06/22/2024   ALBUMINELP 3.5 03/31/2024   A1GS 0.2 03/31/2024   A2GS 0.6 03/31/2024   BETS 0.8 03/31/2024   BETA2SER 3.6 07/12/2012   GAMS 1.2  03/31/2024   MSPIKE Not Observed 03/31/2024   SPEI * 07/12/2012     Lab Results  Component Value Date   KPAFRELGTCHN 27.6 (H) 06/22/2024   LAMBDASER 13.0 06/22/2024   KAPLAMBRATIO 2.12 (H) 06/22/2024    Lab Results  Component Value Date   WBC 5.3 08/30/2024   NEUTROABS 3.5 08/30/2024   HGB 10.6 (L) 08/30/2024   HCT 33.9 (L) 08/30/2024   MCV 80.1 08/30/2024   PLT 156 08/30/2024      Chemistry      Component Value Date/Time   NA 139 06/22/2024 1316   NA 137 12/01/2022 1337   NA 140 01/09/2016 1045   K 4.0 06/22/2024 1316   K 3.8 01/09/2016 1045   CL 103 06/22/2024 1316   CO2 29 06/22/2024 1316   CO2 27 01/09/2016 1045   BUN 31 (H) 06/22/2024 1316   BUN 28 12/01/2022 1337   BUN 14.7 01/09/2016 1045   CREATININE 1.41 (H) 06/22/2024 1316   CREATININE 1.0 01/09/2016 1045      Component Value Date/Time   CALCIUM  9.2 06/22/2024 1316   CALCIUM  9.2 01/09/2016 1045   ALKPHOS 42 06/22/2024 1316   ALKPHOS 60 01/09/2016 1045   AST 18 06/22/2024 1316   AST 18 01/09/2016 1045   ALT 10 06/22/2024 1316   ALT 15 01/09/2016 1045   BILITOT 0.6 06/22/2024 1316   BILITOT 0.51 01/09/2016 1045       No results found for: LABCA2  No components found for: [id number]  No results for input(s): INR in the last 168 hours.  No results found for: LABCA2  No results found for: CAN199  No results found for: CAN125  No results found for: CAN153  No results found for: CA2729  No components found for: HGQUANT  No results found for: CEA1, CEA / No results found for: CEA1, CEA   No results found for: [facility]  No results found for: CHROMOGRNA  No results found for: HGBA, HGBA2QUANT, HGBFQUANT, HGBSQUAN (Hemoglobinopathy evaluation)   Lab Results  Component Value Date   LDH 136 01/09/2016    Lab Results  Component Value Date   IRON 28 (L) 03/26/2022   TIBC 304 03/26/2022   IRONPCTSAT 9 (L) 03/26/2022   (Iron and TIBC)  Lab  Results  Component Value Date   FERRITIN 142 05/25/2022    Urinalysis    Component Value Date/Time   COLORURINE AMBER (A) 03/01/2020 1052   APPEARANCEUR CLOUDY (A) 03/01/2020 1052   LABSPEC 1.021 03/01/2020 1052   PHURINE 5.0 03/01/2020 1052   GLUCOSEU NEGATIVE 03/01/2020 1052   HGBUR NEGATIVE 03/01/2020 1052   BILIRUBINUR NEGATIVE 03/01/2020 1052   KETONESUR NEGATIVE 03/01/2020 1052   PROTEINUR 100 (A) 03/01/2020 1052   UROBILINOGEN 0.2 02/29/2020 1537   NITRITE NEGATIVE 03/01/2020 1052   LEUKOCYTESUR NEGATIVE 03/01/2020 1052    STUDIES: CUP PACEART REMOTE DEVICE CHECK Result Date: 08/16/2024 PPM scheduled remote reviewed. Normal device function.  Presenting rhythm: AP-VP 3 AMS events. Longest x 14 sec. No OAC per chart. Next remote 91 days. AB, CVRS     ELIGIBLE FOR AVAILABLE RESEARCH PROTOCOL:   ASSESSMENT: 89 y.o.  40 [place] man with a history of M-GUS, presenting 02/29/2020 with a high globulin fraction, worsening renal function, hypercalcemia, anemia and multiple lytic bone lesions.    (1) IgG Kappa Multiple Myeloma: labs diagnostic on 03/01/2020 with Mspike of 3.8, Kappa free light chain 690.6, and ratio 117.05.  (a) CT A/P on 03/01/2020 shows innumerable lytic lesions involving all visualized bones  (b) bone marrow biopsy 03/11/2020 shows 20% plasmacytosis by CD138, 12% by manual differential; molecular studies not requested  (c) Bortezomib  and Dexamethasone  given weekly 3 weeks on and 1 week off beginning 03/11/2020, with good response, last dose 07/24/2020  (d) transitioned to lenalidomide  10 mg daily 21/7 starting 07/31/2020, this was stopped because of worsening cytopenias. He took it for almost 2.5 yrs     (2) lytic bone lesions/ hypercalcemia:  (a) Pamidronate  administered on 03/01/2020  (b) denosumab /Xgeva  started 04/17/2020-- repeat every 12 weeks   (a) to take TUMS 3 tabs on day of Xgeva  dose  PLAN:  Assessment and Plan Assessment & Plan  Multiple  myeloma in remission Multiple myeloma remains in remission since July with no new symptoms. - Monitor myeloma status for recurrence. - Initiate treatment if evidence of relapse -Last labs in July with no evidence of M protein. - CBC from today without any major concerns.  Peripheral neuropathy Peripheral neuropathy symptoms are worsening despite current management. - Adjust gabapentin  dosage to 100 mg two pills at night and one pill in the morning and one in the afternoon  FU in 8 weeks with labs.

## 2024-08-31 LAB — KAPPA/LAMBDA LIGHT CHAINS
Kappa free light chain: 25.3 mg/L — ABNORMAL HIGH (ref 3.3–19.4)
Kappa, lambda light chain ratio: 1.95 — ABNORMAL HIGH (ref 0.26–1.65)
Lambda free light chains: 13 mg/L (ref 5.7–26.3)

## 2024-09-04 LAB — MULTIPLE MYELOMA PANEL, SERUM
Albumin SerPl Elph-Mcnc: 3.4 g/dL (ref 2.9–4.4)
Albumin/Glob SerPl: 1.2 (ref 0.7–1.7)
Alpha 1: 0.2 g/dL (ref 0.0–0.4)
Alpha2 Glob SerPl Elph-Mcnc: 0.6 g/dL (ref 0.4–1.0)
B-Globulin SerPl Elph-Mcnc: 0.9 g/dL (ref 0.7–1.3)
Gamma Glob SerPl Elph-Mcnc: 1.2 g/dL (ref 0.4–1.8)
Globulin, Total: 2.9 g/dL (ref 2.2–3.9)
IgA: 212 mg/dL (ref 61–437)
IgG (Immunoglobin G), Serum: 1276 mg/dL (ref 603–1613)
IgM (Immunoglobulin M), Srm: 53 mg/dL (ref 15–143)
Total Protein ELP: 6.3 g/dL (ref 6.0–8.5)

## 2024-09-06 DIAGNOSIS — Z23 Encounter for immunization: Secondary | ICD-10-CM | POA: Diagnosis not present

## 2024-09-17 ENCOUNTER — Other Ambulatory Visit: Payer: Self-pay | Admitting: Hematology and Oncology

## 2024-09-18 ENCOUNTER — Other Ambulatory Visit: Payer: Self-pay | Admitting: Hematology and Oncology

## 2024-09-18 NOTE — Telephone Encounter (Signed)
 Refilled Gabapentin  per last office note

## 2024-09-22 ENCOUNTER — Other Ambulatory Visit: Payer: Self-pay

## 2024-09-22 NOTE — Telephone Encounter (Signed)
 Pt called requesting refill on his gabapentin . Called pt back and advised it was refilled 10/20. He verbalized thanks and understanding.

## 2024-10-31 ENCOUNTER — Other Ambulatory Visit: Payer: Self-pay | Admitting: Hematology and Oncology

## 2024-10-31 DIAGNOSIS — C9000 Multiple myeloma not having achieved remission: Secondary | ICD-10-CM

## 2024-11-08 ENCOUNTER — Encounter: Payer: Self-pay | Admitting: Oncology

## 2024-11-11 ENCOUNTER — Other Ambulatory Visit: Payer: Self-pay | Admitting: Hematology and Oncology

## 2024-11-13 ENCOUNTER — Ambulatory Visit: Payer: Medicare Other

## 2024-11-13 DIAGNOSIS — I442 Atrioventricular block, complete: Secondary | ICD-10-CM

## 2024-11-14 LAB — CUP PACEART REMOTE DEVICE CHECK
Battery Remaining Longevity: 78 mo
Battery Remaining Percentage: 79 %
Battery Voltage: 3.01 V
Brady Statistic AP VP Percent: 67 %
Brady Statistic AP VS Percent: 1 %
Brady Statistic AS VP Percent: 33 %
Brady Statistic AS VS Percent: 1 %
Brady Statistic RA Percent Paced: 67 %
Brady Statistic RV Percent Paced: 99 %
Date Time Interrogation Session: 20251215042055
Implantable Lead Connection Status: 753985
Implantable Lead Connection Status: 753985
Implantable Lead Implant Date: 20231215
Implantable Lead Implant Date: 20231215
Implantable Lead Location: 753859
Implantable Lead Location: 753860
Implantable Lead Model: 1944
Implantable Lead Model: 1948
Implantable Pulse Generator Implant Date: 20231215
Lead Channel Impedance Value: 490 Ohm
Lead Channel Impedance Value: 550 Ohm
Lead Channel Pacing Threshold Amplitude: 0.5 V
Lead Channel Pacing Threshold Amplitude: 0.5 V
Lead Channel Pacing Threshold Pulse Width: 0.5 ms
Lead Channel Pacing Threshold Pulse Width: 0.5 ms
Lead Channel Sensing Intrinsic Amplitude: 10.8 mV
Lead Channel Sensing Intrinsic Amplitude: 2.5 mV
Lead Channel Setting Pacing Amplitude: 2.5 V
Lead Channel Setting Pacing Amplitude: 2.5 V
Lead Channel Setting Pacing Pulse Width: 0.5 ms
Lead Channel Setting Sensing Sensitivity: 4 mV
Pulse Gen Model: 2272
Pulse Gen Serial Number: 8129463

## 2024-11-17 NOTE — Progress Notes (Signed)
 Remote PPM Transmission

## 2024-11-28 ENCOUNTER — Telehealth: Payer: Self-pay | Admitting: Adult Health

## 2024-11-28 NOTE — Telephone Encounter (Signed)
 I spoke with patient as he called in to reschedule appointments from 12/05/2024 to 12/11/2024. Patient aware of date/time.

## 2024-11-29 ENCOUNTER — Ambulatory Visit: Payer: Self-pay | Admitting: Cardiovascular Disease

## 2024-12-05 ENCOUNTER — Other Ambulatory Visit

## 2024-12-05 ENCOUNTER — Ambulatory Visit: Admitting: Hematology and Oncology

## 2024-12-09 ENCOUNTER — Other Ambulatory Visit: Payer: Self-pay | Admitting: Hematology and Oncology

## 2024-12-11 ENCOUNTER — Encounter: Payer: Self-pay | Admitting: Adult Health

## 2024-12-11 ENCOUNTER — Inpatient Hospital Stay: Admitting: Adult Health

## 2024-12-11 ENCOUNTER — Inpatient Hospital Stay: Attending: Hematology and Oncology

## 2024-12-11 ENCOUNTER — Inpatient Hospital Stay

## 2024-12-11 VITALS — BP 107/47 | HR 73 | Temp 97.5°F | Resp 17 | Ht 71.0 in | Wt 188.4 lb

## 2024-12-11 DIAGNOSIS — Z79899 Other long term (current) drug therapy: Secondary | ICD-10-CM | POA: Insufficient documentation

## 2024-12-11 DIAGNOSIS — T451X5A Adverse effect of antineoplastic and immunosuppressive drugs, initial encounter: Secondary | ICD-10-CM | POA: Diagnosis not present

## 2024-12-11 DIAGNOSIS — C9001 Multiple myeloma in remission: Secondary | ICD-10-CM | POA: Diagnosis not present

## 2024-12-11 DIAGNOSIS — C9000 Multiple myeloma not having achieved remission: Secondary | ICD-10-CM

## 2024-12-11 DIAGNOSIS — Z950 Presence of cardiac pacemaker: Secondary | ICD-10-CM | POA: Insufficient documentation

## 2024-12-11 DIAGNOSIS — G620 Drug-induced polyneuropathy: Secondary | ICD-10-CM | POA: Insufficient documentation

## 2024-12-11 LAB — CBC WITH DIFFERENTIAL/PLATELET
Abs Immature Granulocytes: 0.01 K/uL (ref 0.00–0.07)
Basophils Absolute: 0 K/uL (ref 0.0–0.1)
Basophils Relative: 1 %
Eosinophils Absolute: 0.1 K/uL (ref 0.0–0.5)
Eosinophils Relative: 2 %
HCT: 36.5 % — ABNORMAL LOW (ref 39.0–52.0)
Hemoglobin: 11.5 g/dL — ABNORMAL LOW (ref 13.0–17.0)
Immature Granulocytes: 0 %
Lymphocytes Relative: 26 %
Lymphs Abs: 1.4 K/uL (ref 0.7–4.0)
MCH: 24.9 pg — ABNORMAL LOW (ref 26.0–34.0)
MCHC: 31.5 g/dL (ref 30.0–36.0)
MCV: 79 fL — ABNORMAL LOW (ref 80.0–100.0)
Monocytes Absolute: 0.5 K/uL (ref 0.1–1.0)
Monocytes Relative: 9 %
Neutro Abs: 3.1 K/uL (ref 1.7–7.7)
Neutrophils Relative %: 62 %
Platelets: 154 K/uL (ref 150–400)
RBC: 4.62 MIL/uL (ref 4.22–5.81)
RDW: 12.9 % (ref 11.5–15.5)
WBC: 5.1 K/uL (ref 4.0–10.5)
nRBC: 0 % (ref 0.0–0.2)

## 2024-12-11 LAB — CMP (CANCER CENTER ONLY)
ALT: 15 U/L (ref 0–44)
AST: 24 U/L (ref 15–41)
Albumin: 4.1 g/dL (ref 3.5–5.0)
Alkaline Phosphatase: 66 U/L (ref 38–126)
Anion gap: 7 (ref 5–15)
BUN: 21 mg/dL (ref 8–23)
CO2: 30 mmol/L (ref 22–32)
Calcium: 9.9 mg/dL (ref 8.9–10.3)
Chloride: 100 mmol/L (ref 98–111)
Creatinine: 1.25 mg/dL — ABNORMAL HIGH (ref 0.61–1.24)
GFR, Estimated: 54 mL/min — ABNORMAL LOW
Glucose, Bld: 98 mg/dL (ref 70–99)
Potassium: 4 mmol/L (ref 3.5–5.1)
Sodium: 138 mmol/L (ref 135–145)
Total Bilirubin: 0.6 mg/dL (ref 0.0–1.2)
Total Protein: 6.8 g/dL (ref 6.5–8.1)

## 2024-12-11 MED ORDER — GABAPENTIN 100 MG PO CAPS: 200.0000 mg | ORAL_CAPSULE | Freq: Two times a day (BID) | ORAL | 0 refills | Status: AC

## 2024-12-11 NOTE — Progress Notes (Signed)
 [facility] Cancer Follow up:    Joe Lombard, MD [address] [place] [address]   DIAGNOSIS: Cancer Staging  No matching staging information was found for the patient.    SUMMARY OF ONCOLOGIC HISTORY:  [place] man with a history of M-GUS, presenting 02/29/2020 with a high globulin fraction, worsening renal function, hypercalcemia, anemia and multiple lytic bone lesions.    (1) IgG Kappa Multiple Myeloma: labs diagnostic on 03/01/2020 with Mspike of 3.8, Kappa free light chain 690.6, and ratio 117.05.             (a) CT A/P on 03/01/2020 shows innumerable lytic lesions involving all visualized bones             (b) bone marrow biopsy 03/11/2020 shows 20% plasmacytosis by CD138, 12% by manual differential; molecular studies not requested             (c) Bortezomib  and Dexamethasone  given weekly 3 weeks on and 1 week off beginning 03/11/2020, with good response, last dose 07/24/2020             (d) transitioned to lenalidomide  10 mg daily 21/7 starting 07/31/2020, this was stopped because of worsening cytopenias. He took it for almost 2.5 yrs                           (2) lytic bone lesions/ hypercalcemia:             (a) Pamidronate  administered on 03/01/2020             (b) denosumab /Xgeva  started 04/17/2020-- repeat every 12 weeks                         (a) to take TUMS 3 tabs on day of Xgeva  dose  CURRENT THERAPY: observation  INTERVAL HISTORY:  Discussed the use of AI scribe software for clinical note transcription with the patient, who gave verbal consent to proceed.  History of Present Illness Joe [name] is a 89 year old male with multiple myeloma who presents for routine oncology follow-up and laboratory surveillance.  He is monitored every three months. Myeloma panels in July and October showed no relapse. Today his CBC is normal, and additional blood was drawn for myeloma panel, free light chains, and CMP. He has no new pain,  cough, or shortness of breath. Vaccinations are up to date, including influenza.  He has chemotherapy-induced peripheral neuropathy that he feels is worsening. He takes gabapentin , recently adjusted to two capsules in the morning and two at night. He reports using another neuropathy medication but is unsure of the name. He previously ran out of gabapentin  due to a dosing discrepancy.  He is requesting an updated prescription today.   He otherwise feels well with no new symptoms or concerns.     Patient Active Problem List   Diagnosis Date Noted   Cardiac pacemaker in situ 11/14/2022   Complete heart block (HCC) 11/12/2022   Acute gout involving toe of left foot 01/27/2021   Multiple myeloma (HCC) 03/02/2020   Goals of care, counseling/discussion 03/02/2020   Hypertension    Hypercalcemia    AKI (acute kidney injury)    Normocytic anemia    HLD (hyperlipidemia)    BPH (benign prostatic hyperplasia)    Elevated total protein    Hyponatremia    Back pain     has no known allergies.  MEDICAL HISTORY: Past Medical  History:  Diagnosis Date   Enlarged prostate    Hypertension    Melanoma (HCC) 2021   Multiple myeloma (HCC)     SURGICAL HISTORY: Past Surgical History:  Procedure Laterality Date   EYE SURGERY     PACEMAKER IMPLANT N/A 11/13/2022   Procedure: PACEMAKER IMPLANT;  Surgeon: Fernande Elspeth BROCKS, MD;  Location: [facility] INVASIVE CV LAB;  Service: Cardiovascular;  Laterality: N/A;   PROSTATE SURGERY      SOCIAL HISTORY: Social History   Socioeconomic History   Marital status: Married    Spouse name: Not on file   Number of children: Not on file   Years of education: Not on file   Highest education level: Not on file  Occupational History   Not on file  Tobacco Use   Smoking status: Never   Smokeless tobacco: Never  Vaping Use   Vaping status: Never Used  Substance and Sexual Activity   Alcohol use: Never   Drug use: Never   Sexual activity: Not on file  Other  Topics Concern   Not on file  Social History Narrative   Not on file   Social Drivers of Health   Tobacco Use: Low Risk (12/11/2024)   Patient History    Smoking Tobacco Use: Never    Smokeless Tobacco Use: Never    Passive Exposure: Not on file  Financial Resource Strain: Not on file  Food Insecurity: No Food Insecurity (09/28/2022)   Hunger Vital Sign    Worried About Running Out of Food in the Last Year: Never true    Ran Out of Food in the Last Year: Never true  Transportation Needs: No Transportation Needs (09/28/2022)   PRAPARE - Administrator, Civil Service (Medical): No    Lack of Transportation (Non-Medical): No  Physical Activity: Not on file  Stress: Not on file  Social Connections: Not on file  Intimate Partner Violence: Not on file  Depression (PHQ2-9): Low Risk (12/11/2024)   Depression (PHQ2-9)    PHQ-2 Score: 0  Alcohol Screen: Not on file  Housing: Low Risk (09/28/2022)   Housing    Last Housing Risk Score: 0  Utilities: Not on file  Health Literacy: Not on file    FAMILY HISTORY: Family History  Problem Relation Age of Onset   Hypertension Mother     Review of Systems  Constitutional:  Negative for appetite change, chills, fatigue, fever and unexpected weight change.  HENT:   Negative for hearing loss, lump/mass and trouble swallowing.   Eyes:  Negative for eye problems and icterus.  Respiratory:  Negative for chest tightness, cough and shortness of breath.   Cardiovascular:  Negative for chest pain, leg swelling and palpitations.  Gastrointestinal:  Negative for abdominal distention, abdominal pain, constipation, diarrhea, nausea and vomiting.  Endocrine: Negative for hot flashes.  Genitourinary:  Negative for difficulty urinating.   Musculoskeletal:  Negative for arthralgias.  Skin:  Negative for itching and rash.  Neurological:  Positive for numbness. Negative for dizziness, extremity weakness and headaches.  Hematological:   Negative for adenopathy. Does not bruise/bleed easily.  Psychiatric/Behavioral:  Negative for depression. The patient is not nervous/anxious.       PHYSICAL EXAMINATION   Onc Performance Status - 12/11/24 1200       ECOG Perf Status   ECOG Perf Status Restricted in physically strenuous activity but ambulatory and able to carry out work of a light or sedentary nature, e.g., light house work, office work  KPS SCALE   KPS % SCORE Able to carry on normal activity, minor s/s of disease          Vitals:   12/11/24 1159  BP: (!) 107/47  Pulse: 73  Resp: 17  Temp: (!) 97.5 F (36.4 C)  SpO2: 98%    Physical Exam Constitutional:      General: He is not in acute distress.    Appearance: Normal appearance. He is not toxic-appearing.  HENT:     Head: Normocephalic and atraumatic.     Mouth/Throat:     Mouth: Mucous membranes are moist.     Pharynx: Oropharynx is clear. No oropharyngeal exudate or posterior oropharyngeal erythema.  Eyes:     General: No scleral icterus. Cardiovascular:     Rate and Rhythm: Normal rate and regular rhythm.     Pulses: Normal pulses.     Heart sounds: Normal heart sounds.  Pulmonary:     Effort: Pulmonary effort is normal.     Breath sounds: Normal breath sounds.  Abdominal:     General: Abdomen is flat. Bowel sounds are normal. There is no distension.     Palpations: Abdomen is soft.     Tenderness: There is no abdominal tenderness.  Musculoskeletal:        General: No swelling.     Cervical back: Neck supple.  Lymphadenopathy:     Cervical: No cervical adenopathy.  Skin:    General: Skin is warm and dry.     Findings: No rash.  Neurological:     General: No focal deficit present.     Mental Status: He is alert.  Psychiatric:        Mood and Affect: Mood normal.        Behavior: Behavior normal.     LABORATORY DATA:  CBC    Component Value Date/Time   WBC 5.1 12/11/2024 1100   RBC 4.62 12/11/2024 1100   HGB 11.5 (L)  12/11/2024 1100   HGB 10.4 (L) 09/30/2023 1503   HGB 12.0 (L) 01/09/2016 1045   HCT 36.5 (L) 12/11/2024 1100   HCT 27.4 (L) 03/01/2020 1610   HCT 38.4 01/09/2016 1045   PLT 154 12/11/2024 1100   PLT 133 (L) 09/30/2023 1503   PLT 187 01/09/2016 1045   MCV 79.0 (L) 12/11/2024 1100   MCV 78.4 (L) 01/09/2016 1045   MCH 24.9 (L) 12/11/2024 1100   MCHC 31.5 12/11/2024 1100   RDW 12.9 12/11/2024 1100   RDW 13.2 01/09/2016 1045   LYMPHSABS 1.4 12/11/2024 1100   LYMPHSABS 1.2 01/09/2016 1045   MONOABS 0.5 12/11/2024 1100   MONOABS 0.4 01/09/2016 1045   EOSABS 0.1 12/11/2024 1100   EOSABS 0.1 01/09/2016 1045   BASOSABS 0.0 12/11/2024 1100   BASOSABS 0.0 01/09/2016 1045    CMP     Component Value Date/Time   NA 136 08/30/2024 1408   NA 137 12/01/2022 1337   NA 140 01/09/2016 1045   K 4.5 08/30/2024 1408   K 3.8 01/09/2016 1045   CL 101 08/30/2024 1408   CO2 32 08/30/2024 1408   CO2 27 01/09/2016 1045   GLUCOSE 113 (H) 08/30/2024 1408   GLUCOSE 89 01/09/2016 1045   BUN 25 (H) 08/30/2024 1408   BUN 28 12/01/2022 1337   BUN 14.7 01/09/2016 1045   CREATININE 1.40 (H) 08/30/2024 1408   CREATININE 1.0 01/09/2016 1045   CALCIUM  9.1 08/30/2024 1408   CALCIUM  9.2 01/09/2016 1045   PROT 6.3 (L)  08/30/2024 1408   PROT 7.3 01/09/2016 1045   ALBUMIN 3.8 08/30/2024 1408   ALBUMIN 3.7 01/09/2016 1045   AST 17 08/30/2024 1408   AST 18 01/09/2016 1045   ALT 10 08/30/2024 1408   ALT 15 01/09/2016 1045   ALKPHOS 43 08/30/2024 1408   ALKPHOS 60 01/09/2016 1045   BILITOT 0.5 08/30/2024 1408   BILITOT 0.51 01/09/2016 1045   GFRNONAA 47 (L) 08/30/2024 1408   GFRAA >60 08/30/2020 1402     ASSESSMENT and THERAPY PLAN:    Assessment and Plan Assessment & Plan Multiple myeloma No evidence of relapse. Asymptomatic with unremarkable surveillance findings. - Ordered myeloma panel, capillary light chain, and CMP for surveillance. (Labs pending) - RTC in 3 months for lab and f/u.     Chemotherapy-induced peripheral neuropathy Continued neuropathy, managed with gabapentin . - Sent new prescription for gabapentin , two tablets twice daily. - Reviewed current symptoms and confirmed medication regimen.   RTC in 3 months for lab and f/u.     All questions were answered. The patient knows to call the clinic with any problems, questions or concerns. We can certainly see the patient much sooner if necessary.  Total encounter time:20 minutes*in face-to-face visit time, chart review, lab review, care coordination, order entry, and documentation of the encounter time.    Morna Kendall, NP 12/11/2024 12:20 PM Medical Oncology and Hematology [facility] [address] [place], [place] [address] Tel. [phone]    Fax. [phone]  *Total Encounter Time as defined by the Centers for Medicare and Medicaid Services includes, in addition to the face-to-face time of a patient visit (documented in the note above) non-face-to-face time: obtaining and reviewing outside history, ordering and reviewing medications, tests or procedures, care coordination (communications with other health care professionals or caregivers) and documentation in the medical record.

## 2024-12-12 LAB — KAPPA/LAMBDA LIGHT CHAINS
Kappa free light chain: 23.3 mg/L — ABNORMAL HIGH (ref 3.3–19.4)
Kappa, lambda light chain ratio: 1.82 — ABNORMAL HIGH (ref 0.26–1.65)
Lambda free light chains: 12.8 mg/L (ref 5.7–26.3)

## 2024-12-13 ENCOUNTER — Ambulatory Visit: Payer: Self-pay

## 2024-12-13 LAB — MULTIPLE MYELOMA PANEL, SERUM
Albumin SerPl Elph-Mcnc: 3.3 g/dL (ref 2.9–4.4)
Albumin/Glob SerPl: 1 (ref 0.7–1.7)
Alpha 1: 0.3 g/dL (ref 0.0–0.4)
Alpha2 Glob SerPl Elph-Mcnc: 0.7 g/dL (ref 0.4–1.0)
B-Globulin SerPl Elph-Mcnc: 1 g/dL (ref 0.7–1.3)
Gamma Glob SerPl Elph-Mcnc: 1.4 g/dL (ref 0.4–1.8)
Globulin, Total: 3.4 g/dL (ref 2.2–3.9)
IgA: 236 mg/dL (ref 61–437)
IgG (Immunoglobin G), Serum: 1374 mg/dL (ref 603–1613)
IgM (Immunoglobulin M), Srm: 51 mg/dL (ref 15–143)
Total Protein ELP: 6.7 g/dL (ref 6.0–8.5)

## 2024-12-13 NOTE — Telephone Encounter (Signed)
-----   Message from Morna Kendall, NP sent at 12/13/2024 12:18 PM EST ----- Pleaes let patient know his labs are good and show his myeloma is still in remission :)  Will see him back in a few months for a recheck :)

## 2025-02-12 ENCOUNTER — Encounter

## 2025-03-15 ENCOUNTER — Inpatient Hospital Stay: Admitting: Hematology and Oncology

## 2025-03-15 ENCOUNTER — Inpatient Hospital Stay: Attending: Hematology and Oncology

## 2025-05-14 ENCOUNTER — Encounter

## 2025-08-13 ENCOUNTER — Encounter
